# Patient Record
Sex: Female | Born: 1940
Health system: Southern US, Community
[De-identification: ages and names within clinical notes are randomized; demographics above are authoritative.]

## PROBLEM LIST (undated history)

## (undated) DIAGNOSIS — I2510 Atherosclerotic heart disease of native coronary artery without angina pectoris: Secondary | ICD-10-CM

## (undated) DIAGNOSIS — G250 Essential tremor: Principal | ICD-10-CM

## (undated) DIAGNOSIS — I951 Orthostatic hypotension: Secondary | ICD-10-CM

## (undated) DIAGNOSIS — K219 Gastro-esophageal reflux disease without esophagitis: Secondary | ICD-10-CM

## (undated) DIAGNOSIS — E039 Hypothyroidism, unspecified: Secondary | ICD-10-CM

## (undated) DIAGNOSIS — F419 Anxiety disorder, unspecified: Secondary | ICD-10-CM

## (undated) DIAGNOSIS — I350 Nonrheumatic aortic (valve) stenosis: Secondary | ICD-10-CM

## (undated) DIAGNOSIS — D473 Essential (hemorrhagic) thrombocythemia: Principal | ICD-10-CM

## (undated) DIAGNOSIS — E785 Hyperlipidemia, unspecified: Secondary | ICD-10-CM

## (undated) DIAGNOSIS — I10 Essential (primary) hypertension: Secondary | ICD-10-CM

## (undated) DIAGNOSIS — K589 Irritable bowel syndrome without diarrhea: Secondary | ICD-10-CM

## (undated) DIAGNOSIS — G20A1 Parkinson's disease without dyskinesia, without mention of fluctuations: Secondary | ICD-10-CM

## (undated) HISTORY — DX: Atherosclerotic heart disease of native coronary artery without angina pectoris: I25.10

## (undated) HISTORY — DX: Essential (hemorrhagic) thrombocythemia: D47.3

## (undated) HISTORY — PX: TRANSTHORACIC ECHOCARDIOGRAM: SHX275

## (undated) HISTORY — DX: Essential tremor: G25.0

## (undated) HISTORY — DX: Nonrheumatic aortic (valve) stenosis: I35.0

## (undated) HISTORY — DX: Orthostatic hypotension: I95.1

---
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
## 1977-03-16 HISTORY — PX: ABDOMINAL HYSTERECTOMY: SHX81

## 1992-03-16 HISTORY — PX: TOE SURGERY: SHX1073

## 1998-06-10 ENCOUNTER — Other Ambulatory Visit: Admission: RE | Admit: 1998-06-10 | Discharge: 1998-06-10 | Payer: Self-pay | Admitting: Obstetrics & Gynecology

## 1999-03-17 HISTORY — PX: CHOLECYSTECTOMY, LAPAROSCOPIC: SHX56

## 1999-06-09 ENCOUNTER — Other Ambulatory Visit: Admission: RE | Admit: 1999-06-09 | Discharge: 1999-06-09 | Payer: Self-pay | Admitting: Obstetrics & Gynecology

## 2000-02-27 ENCOUNTER — Encounter: Payer: Self-pay | Admitting: Family Medicine

## 2000-02-27 ENCOUNTER — Encounter: Admission: RE | Admit: 2000-02-27 | Discharge: 2000-02-27 | Payer: Self-pay | Admitting: Family Medicine

## 2000-03-05 ENCOUNTER — Observation Stay (HOSPITAL_COMMUNITY): Admission: RE | Admit: 2000-03-05 | Discharge: 2000-03-06 | Payer: Self-pay | Admitting: Surgery

## 2000-03-05 ENCOUNTER — Encounter (INDEPENDENT_AMBULATORY_CARE_PROVIDER_SITE_OTHER): Payer: Self-pay | Admitting: Specialist

## 2000-04-30 ENCOUNTER — Ambulatory Visit (HOSPITAL_COMMUNITY): Admission: RE | Admit: 2000-04-30 | Discharge: 2000-04-30 | Payer: Self-pay | Admitting: Gastroenterology

## 2000-06-24 ENCOUNTER — Other Ambulatory Visit: Admission: RE | Admit: 2000-06-24 | Discharge: 2000-06-24 | Payer: Self-pay | Admitting: Obstetrics & Gynecology

## 2002-08-01 ENCOUNTER — Other Ambulatory Visit: Admission: RE | Admit: 2002-08-01 | Discharge: 2002-08-01 | Payer: Self-pay | Admitting: Obstetrics & Gynecology

## 2003-08-30 ENCOUNTER — Other Ambulatory Visit: Admission: RE | Admit: 2003-08-30 | Discharge: 2003-08-30 | Payer: Self-pay | Admitting: Obstetrics & Gynecology

## 2004-08-13 ENCOUNTER — Ambulatory Visit (HOSPITAL_COMMUNITY): Admission: RE | Admit: 2004-08-13 | Discharge: 2004-08-13 | Payer: Self-pay | Admitting: Gastroenterology

## 2004-08-13 ENCOUNTER — Encounter (INDEPENDENT_AMBULATORY_CARE_PROVIDER_SITE_OTHER): Payer: Self-pay | Admitting: Specialist

## 2005-10-21 ENCOUNTER — Encounter: Admission: RE | Admit: 2005-10-21 | Discharge: 2005-10-21 | Payer: Self-pay | Admitting: Gastroenterology

## 2007-12-07 ENCOUNTER — Ambulatory Visit (HOSPITAL_COMMUNITY): Admission: RE | Admit: 2007-12-07 | Discharge: 2007-12-07 | Payer: Self-pay | Admitting: Gastroenterology

## 2007-12-09 ENCOUNTER — Emergency Department (HOSPITAL_COMMUNITY): Admission: EM | Admit: 2007-12-09 | Discharge: 2007-12-09 | Payer: Self-pay | Admitting: Emergency Medicine

## 2008-06-06 ENCOUNTER — Encounter: Admission: RE | Admit: 2008-06-06 | Discharge: 2008-06-06 | Payer: Self-pay | Admitting: Family Medicine

## 2008-07-11 ENCOUNTER — Ambulatory Visit: Payer: Self-pay | Admitting: Vascular Surgery

## 2008-12-26 ENCOUNTER — Ambulatory Visit: Payer: Self-pay | Admitting: Oncology

## 2009-01-16 LAB — CBC WITH DIFFERENTIAL (CANCER CENTER ONLY)
BASO%: 0.6 % (ref 0.0–2.0)
Eosinophils Absolute: 1.2 10*3/uL — ABNORMAL HIGH (ref 0.0–0.5)
HCT: 38.6 % (ref 34.8–46.6)
LYMPH%: 14.8 % (ref 14.0–48.0)
MCH: 30 pg (ref 26.0–34.0)
MCV: 90 fL (ref 81–101)
MONO%: 6 % (ref 0.0–13.0)
NEUT%: 63.8 % (ref 39.6–80.0)
Platelets: 551 10*3/uL — ABNORMAL HIGH (ref 145–400)
RDW: 13.2 % (ref 10.5–14.6)

## 2009-01-16 LAB — CMP (CANCER CENTER ONLY)
CO2: 29 mEq/L (ref 18–33)
Calcium: 9 mg/dL (ref 8.0–10.3)
Glucose, Bld: 138 mg/dL — ABNORMAL HIGH (ref 73–118)
Sodium: 140 mEq/L (ref 128–145)
Total Bilirubin: 0.5 mg/dl (ref 0.20–1.60)
Total Protein: 7 g/dL (ref 6.4–8.1)

## 2009-01-16 LAB — MORPHOLOGY - CHCC SATELLITE: RBC Comments: NORMAL

## 2009-01-22 LAB — PROTEIN ELECTROPHORESIS, SERUM
Alpha-1-Globulin: 4.6 % (ref 2.9–4.9)
Alpha-2-Globulin: 8.3 % (ref 7.1–11.8)
Beta 2: 4.1 % (ref 3.2–6.5)
Beta Globulin: 5.7 % (ref 4.7–7.2)
Gamma Globulin: 15.3 % (ref 11.1–18.8)

## 2009-01-22 LAB — IRON AND TIBC
%SAT: 29 % (ref 20–55)
Iron: 81 ug/dL (ref 42–145)

## 2009-01-22 LAB — LACTATE DEHYDROGENASE: LDH: 143 U/L (ref 94–250)

## 2009-02-12 ENCOUNTER — Ambulatory Visit: Payer: Self-pay | Admitting: Oncology

## 2009-02-13 ENCOUNTER — Other Ambulatory Visit: Admission: RE | Admit: 2009-02-13 | Discharge: 2009-02-13 | Payer: Self-pay | Admitting: Oncology

## 2009-02-13 LAB — CBC WITH DIFFERENTIAL (CANCER CENTER ONLY)
BASO#: 0.1 10*3/uL (ref 0.0–0.2)
Eosinophils Absolute: 1.6 10*3/uL — ABNORMAL HIGH (ref 0.0–0.5)
HCT: 37.4 % (ref 34.8–46.6)
HGB: 12.6 g/dL (ref 11.6–15.9)
LYMPH%: 14.6 % (ref 14.0–48.0)
MCH: 30.6 pg (ref 26.0–34.0)
MCV: 90 fL (ref 81–101)
MONO#: 0.4 10*3/uL (ref 0.1–0.9)
MONO%: 5.4 % (ref 0.0–13.0)
RBC: 4.13 10*6/uL (ref 3.70–5.32)

## 2009-03-11 ENCOUNTER — Ambulatory Visit: Payer: Self-pay | Admitting: Oncology

## 2009-03-13 LAB — CBC WITH DIFFERENTIAL (CANCER CENTER ONLY)
BASO%: 0.7 % (ref 0.0–2.0)
EOS%: 14.7 % — ABNORMAL HIGH (ref 0.0–7.0)
LYMPH%: 17.3 % (ref 14.0–48.0)
MCHC: 32.8 g/dL (ref 32.0–36.0)
MCV: 90 fL (ref 81–101)
MONO#: 0.5 10*3/uL (ref 0.1–0.9)
NEUT%: 61.5 % (ref 39.6–80.0)
Platelets: 548 10*3/uL — ABNORMAL HIGH (ref 145–400)
RDW: 12.7 % (ref 10.5–14.6)
WBC: 8.3 10*3/uL (ref 3.9–10.0)

## 2009-05-29 ENCOUNTER — Encounter: Admission: RE | Admit: 2009-05-29 | Discharge: 2009-05-29 | Payer: Self-pay | Admitting: Gastroenterology

## 2009-06-07 ENCOUNTER — Ambulatory Visit: Payer: Self-pay | Admitting: Oncology

## 2009-06-12 LAB — CBC WITH DIFFERENTIAL (CANCER CENTER ONLY)
BASO%: 0.6 % (ref 0.0–2.0)
EOS%: 14.2 % — ABNORMAL HIGH (ref 0.0–7.0)
LYMPH#: 1.1 10*3/uL (ref 0.9–3.3)
MCHC: 33.8 g/dL (ref 32.0–36.0)
MONO#: 0.5 10*3/uL (ref 0.1–0.9)
NEUT#: 3.8 10*3/uL (ref 1.5–6.5)
NEUT%: 60.2 % (ref 39.6–80.0)
Platelets: 501 10*3/uL — ABNORMAL HIGH (ref 145–400)
RDW: 13.7 % (ref 10.5–14.6)
WBC: 6.3 10*3/uL (ref 3.9–10.0)

## 2009-08-30 ENCOUNTER — Ambulatory Visit: Payer: Self-pay | Admitting: Oncology

## 2009-09-11 LAB — CBC WITH DIFFERENTIAL (CANCER CENTER ONLY)
BASO%: 0.6 % (ref 0.0–2.0)
EOS%: 20 % — ABNORMAL HIGH (ref 0.0–7.0)
HCT: 37.6 % (ref 34.8–46.6)
HGB: 12.5 g/dL (ref 11.6–15.9)
LYMPH#: 1.4 10*3/uL (ref 0.9–3.3)
MCHC: 33.2 g/dL (ref 32.0–36.0)
MONO#: 0.5 10*3/uL (ref 0.1–0.9)
NEUT#: 4.3 10*3/uL (ref 1.5–6.5)
NEUT%: 54.4 % (ref 39.6–80.0)
RDW: 13.3 % (ref 10.5–14.6)
WBC: 7.9 10*3/uL (ref 3.9–10.0)

## 2009-09-11 LAB — CMP (CANCER CENTER ONLY)
ALT(SGPT): 23 U/L (ref 10–47)
AST: 21 U/L (ref 11–38)
Albumin: 4.3 g/dL (ref 3.3–5.5)
CO2: 30 mEq/L (ref 18–33)
Calcium: 9 mg/dL (ref 8.0–10.3)
Chloride: 100 mEq/L (ref 98–108)
Potassium: 4.4 mEq/L (ref 3.3–4.7)
Total Protein: 6.7 g/dL (ref 6.4–8.1)

## 2009-10-11 ENCOUNTER — Ambulatory Visit: Payer: Self-pay | Admitting: Oncology

## 2009-10-16 LAB — CBC WITH DIFFERENTIAL (CANCER CENTER ONLY)
BASO%: 0.6 % (ref 0.0–2.0)
EOS%: 17.7 % — ABNORMAL HIGH (ref 0.0–7.0)
HCT: 37 % (ref 34.8–46.6)
LYMPH%: 17 % (ref 14.0–48.0)
MCH: 30.2 pg (ref 26.0–34.0)
MCHC: 34.6 g/dL (ref 32.0–36.0)
MCV: 87 fL (ref 81–101)
MONO%: 7 % (ref 0.0–13.0)
NEUT%: 57.7 % (ref 39.6–80.0)
Platelets: 597 10*3/uL — ABNORMAL HIGH (ref 145–400)
RDW: 13.3 % (ref 10.5–14.6)
WBC: 9 10*3/uL (ref 3.9–10.0)

## 2009-11-07 ENCOUNTER — Ambulatory Visit: Payer: Self-pay | Admitting: Oncology

## 2009-11-13 LAB — CBC WITH DIFFERENTIAL (CANCER CENTER ONLY)
BASO%: 0.5 % (ref 0.0–2.0)
EOS%: 14.7 % — ABNORMAL HIGH (ref 0.0–7.0)
HCT: 35.7 % (ref 34.8–46.6)
LYMPH#: 1.1 10*3/uL (ref 0.9–3.3)
MCHC: 33.8 g/dL (ref 32.0–36.0)
MONO#: 0.5 10*3/uL (ref 0.1–0.9)
NEUT%: 61.8 % (ref 39.6–80.0)
Platelets: 562 10*3/uL — ABNORMAL HIGH (ref 145–400)
RDW: 13.1 % (ref 10.5–14.6)
WBC: 7.1 10*3/uL (ref 3.9–10.0)

## 2009-12-18 ENCOUNTER — Ambulatory Visit (HOSPITAL_COMMUNITY): Admission: RE | Admit: 2009-12-18 | Discharge: 2009-12-18 | Payer: Self-pay | Admitting: Gastroenterology

## 2010-01-09 ENCOUNTER — Ambulatory Visit: Payer: Self-pay | Admitting: Oncology

## 2010-01-15 LAB — CBC WITH DIFFERENTIAL (CANCER CENTER ONLY)
BASO%: 0.6 % (ref 0.0–2.0)
EOS%: 13.5 % — ABNORMAL HIGH (ref 0.0–7.0)
MCH: 29.5 pg (ref 26.0–34.0)
MCHC: 33.7 g/dL (ref 32.0–36.0)
MONO%: 6.9 % (ref 0.0–13.0)
NEUT#: 5.1 10*3/uL (ref 1.5–6.5)
Platelets: 546 10*3/uL — ABNORMAL HIGH (ref 145–400)
RBC: 4.36 10*6/uL (ref 3.70–5.32)

## 2010-02-17 ENCOUNTER — Ambulatory Visit: Payer: Self-pay | Admitting: Oncology

## 2010-02-19 LAB — COMPREHENSIVE METABOLIC PANEL
ALT: 13 U/L (ref 0–35)
AST: 18 U/L (ref 0–37)
Albumin: 4.3 g/dL (ref 3.5–5.2)
BUN: 17 mg/dL (ref 6–23)
CO2: 27 mEq/L (ref 19–32)
Calcium: 9.2 mg/dL (ref 8.4–10.5)
Chloride: 105 mEq/L (ref 96–112)
Potassium: 4.4 mEq/L (ref 3.5–5.3)

## 2010-02-19 LAB — CBC WITH DIFFERENTIAL/PLATELET
Basophils Absolute: 0 10*3/uL (ref 0.0–0.1)
EOS%: 17.9 % — ABNORMAL HIGH (ref 0.0–7.0)
HGB: 12.4 g/dL (ref 11.6–15.9)
MCH: 30.5 pg (ref 25.1–34.0)
MONO#: 0.5 10*3/uL (ref 0.1–0.9)
NEUT#: 5.5 10*3/uL (ref 1.5–6.5)
RDW: 14.3 % (ref 11.2–14.5)
WBC: 8.7 10*3/uL (ref 3.9–10.3)
lymph#: 1.1 10*3/uL (ref 0.9–3.3)

## 2010-06-04 ENCOUNTER — Other Ambulatory Visit: Payer: Self-pay | Admitting: Oncology

## 2010-06-04 ENCOUNTER — Encounter (HOSPITAL_BASED_OUTPATIENT_CLINIC_OR_DEPARTMENT_OTHER): Payer: Medicare Other | Admitting: Oncology

## 2010-06-04 DIAGNOSIS — D473 Essential (hemorrhagic) thrombocythemia: Secondary | ICD-10-CM

## 2010-06-04 DIAGNOSIS — D47Z9 Other specified neoplasms of uncertain behavior of lymphoid, hematopoietic and related tissue: Secondary | ICD-10-CM

## 2010-06-04 LAB — CBC WITH DIFFERENTIAL/PLATELET
Eosinophils Absolute: 1.6 10*3/uL — ABNORMAL HIGH (ref 0.0–0.5)
MONO#: 0.5 10*3/uL (ref 0.1–0.9)
MONO%: 5.8 % (ref 0.0–14.0)
NEUT#: 5.6 10*3/uL (ref 1.5–6.5)
RBC: 4.1 10*6/uL (ref 3.70–5.45)
RDW: 14 % (ref 11.2–14.5)
WBC: 8.9 10*3/uL (ref 3.9–10.3)

## 2010-06-04 LAB — LACTATE DEHYDROGENASE: LDH: 156 U/L (ref 94–250)

## 2010-06-17 LAB — BONE MARROW EXAM

## 2010-06-17 LAB — TISSUE HYBRIDIZATION (BONE MARROW)-NCBH

## 2010-07-29 NOTE — Procedures (Signed)
LOWER EXTREMITY VENOUS REFLUX EXAM   INDICATION:   EXAM:  Using color-flow imaging and pulse Doppler spectral analysis, the  bilateral common femoral, superficial femoral, popliteal, posterior  tibial, greater and lesser saphenous veins are evaluated.  There is  evidence suggesting deep venous insufficiency in the bilateral lower  extremities.   The right saphenofemoral junction is competent.  The left saphenofemoral  junction is not competent.  The bilateral GSV veins are not competent  with calibers as described below.   The bilateral proximal short saphenous veins were not adequately  visualized.   GSV Diameter (used if found to be incompetent only)                                            Right    Left  Proximal Greater Saphenous Vein           0.44 cm  0.49 cm  Proximal-to-mid-thigh                     cm       cm  Mid thigh                                 0.49 cm  0.52 cm  Mid-distal thigh                          cm       cm  Distal thigh                              0.4 cm   0.37 cm  Knee                                      0.55 cm  0.37 cm   IMPRESSION:  1. Bilateral greater saphenous vein reflux is noted as described above      and on the attached worksheet.  2. The bilateral greater saphenous veins are not tortuous.  3. Deep venous incompetence is noted.   ___________________________________________  Larina Earthly, M.D.   CH/MEDQ  D:  07/11/2008  T:  07/11/2008  Job:  [id number]

## 2010-07-29 NOTE — Consult Note (Signed)
NEW PATIENT CONSULTATION   Deanna Hill, Deanna Hill  DOB:  1940-12-07                                       07/11/2008  ONGEX#:[id number]   The patient presents today for evaluation of lower extremity venous  pathology.  She is a very pleasant healthy 70 year old white female with  concerns regarding telangiectasia over both lower extremities.  She also  reports discomfort and more specifically numbness over her left  pretibial area.  She reports that this is so significant that she  actually had her leg give way with some bruising over this.  She does  not have a history of deep venous thrombosis and does not have any  varicose veins.  She does not have any other pain in each leg.  Does not  have any history of swelling, hemorrhage, ulceration or  thrombophlebitis.   PAST MEDICAL HISTORY:  Is significant for hypothyroidism and does not  have any history of cardiac disease or hypertension.   FAMILY HISTORY:  Significant for premature atherosclerotic disease in  her father.   SOCIAL HISTORY:  She is married.  She does not smoke or drink alcohol.   REVIEW OF SYSTEMS:  Positive for palpitations, shortness of breath with  exertion, hiatal hernia, reflux, constipation, headache and dizziness.   MEDICATION ALLERGIES:  Iodine.   CURRENT MEDICATIONS:  Synthroid, Kapidex and Zantac.   PHYSICAL EXAMINATION:  A well-developed white female appearing her  stated age of 54.  Blood pressure is 163/87, pulse 78, respirations 18.  Her radial pulses are 2+ bilaterally.  She has 2+ dorsalis pedis pulses  bilaterally.  She does not have any swelling at each extremity and does  not have any evidence of reticular vein or venous varicosities.  She  does have scattered spider vein telangiectasia bilaterally most  prominently over her left thigh and right lateral calf.   She underwent noninvasive vascular laboratory studies in our office and  this reveals gross reflux in her great  saphenous vein bilaterally.  She  does not have any evidence of small saphenous vein reflux.  I discussed  this with the patient and her husband present.  I explained despite the  reflux in her saphenous vein I do not see any evidence of significant  venous hypertension.  She is concerned regarding the appearance of her  spider vein telangiectasia.  I did explain the treatment technique with  sclerotherapy for cosmetic improvement.  I explained that there are  certainly practitioners who would recommend laser ablation of her  refluxing saphenous vein prior to sclerotherapy.  I feel that this would  be inappropriately aggressive since she is not having any other signs or  symptoms of venous hypertension aside from a few scattered  telangiectasia.  I have suggested that she proceed with sclerotherapy if  she wishes to have attempted improvement in cosmesis but would not  recommend more aggressive treatment for her reflux since this is her  only symptom.  She understands this and will notify us when she wishes  to proceed with sclerotherapy.   Larina Earthly, M.D.  Electronically Signed   TFE/MEDQ  D:  07/11/2008  T:  07/12/2008  Job:  [id number]   cc:   Caryn Bee L. Little, M.D.

## 2010-08-01 NOTE — Op Note (Signed)
Deanna Hill, Deanna Hill             ACCOUNT NO.:  [id number]   MEDICAL RECORD NO.:  [id number]          PATIENT TYPE:  AMB   LOCATION:  ENDO                         FACILITY:  [facility]   PHYSICIAN:  [name], M.D.    DATE OF BIRTH:  01/03/1941   DATE OF PROCEDURE:  08/13/2004  DATE OF DISCHARGE:                                 OPERATIVE REPORT   PROCEDURE:  Esophagogastroduodenoscopy with biopsy.   INDICATIONS FOR PROCEDURE:  Patient with long-standing upper tract symptoms,  getting worse, and not responding to the usual medicines.  Consent was  signed after risks, benefits, and options were thoroughly discussed in the  office.   MEDICATIONS USED:  Demerol 60, Versed 6.   PROCEDURE:  The video endoscope was inserted by direct vision.  The  esophagus was normal.  She did have a tiny hiatal hernia.  The scope passed  into the stomach and advanced to the antrum, there was some mild antritis, a  few erosions were seen.  It was advanced through a normal pylorus into a  normal duodenum bulb and around the C-loop to a normal second and probably  third and fourth part of the duodenum.  A few distal duodenal biopsies were  obtained.  The scope was slowly withdrawn back to the bulb which again  confirmed its normal appearance.  The scope was drawn back into the stomach  and retroflexed.  Angularis, cardia, fundus, lesser and greater curve were  all evaluated on retroflex and then straight visualization, other than some  mild gastritis, no abnormalities were seen.  A few biopsies of the antrum  and the erosions were obtained and put in a second container and a few of  the proximal stomach, as well, to rule out Helicobacter.  The air was  suctioned, the scope was slowly withdrawn.  Again, a good look at the  esophagus was normal.  The scope was removed.  The patient tolerated the  procedure well.  There was no obvious complications.   ENDOSCOPIC DIAGNOSIS:  1. Tiny hiatal hernia.  2. Mild  gastritis.  3. A few antral erosions and antritis status post biopsy.  4. Otherwise, normal esophagogastroduodenoscopy to probably the fourth      part of the duodenum status post biopsy.     PLAN:  Await pathology, recheck her symptoms at that junction, probably try  other pump inhibitors we have not tried and we will leave her samples,  follow up p.r.n. or in one month recheck symptoms and decide any other  workup plans at that time.      MEM/MEDQ  D:  08/13/2004  T:  08/13/2004  Job:  [id number]   cc:   Caryn Bee L. Little, M.D.  [address]  [place]  [place] [address]  Fax: [phone]

## 2010-08-01 NOTE — Procedures (Signed)
[facility]  Patient:    Deanna Hill, Deanna Hill                    MRN: [id number] Proc. Date: 04/30/00 Adm. Date:  [id number] Attending:  Nelda Marseille CC:         Anna Genre. Little, M.D.   Procedure Report  PROCEDURE:  Colonoscopy.  INDICATIONS FOR PROCEDURE:  A patient with family history of colon polyps, personal history of colon polyps.  Due for repeat screening.  Consent was signed after risks, benefits, methods and options were thoroughly discussed in the office on multiple occasions.  MEDICATIONS USED:  Demerol 50 mg, Versed 7 mg.  DESCRIPTION OF PROCEDURE:  Rectal inspection was pertinent for external hemorrhoids.  Digital examination was negative.  The video pediatric colonoscope was inserted and easily advanced to the proximal level of the splenic flexure; which had an incredibly tortuous turn. We first rolled her onto her back and then on her right side, then back to her back, and then finally back on her left side again to get around this curve. We then advanced to the hepatic flexure and again met a tortuous curve, which required rolling her on her back and some abdominal pressure to get to the cecum; which was identified by the appendiceal orifice and the ileocecal valve.  No obvious abnormality, but a rare left-sided diverticula was seen on insertion.  The scope was slowly withdrawn.  Her colon was very tortuous, when we did fall back around the loop we did try to readvance around a curve, but it was difficult.  No obvious abnormality was seen on slow withdrawal.  The prep was adequate.  There was minimal liquid still that required washing and suctioning.  Other than the left-sided rare occasional diverticula no other abnormalities were seen.  Once back in the rectum the scope was in retroflexed and pertinent for some internal hemorrhoids.  The scope was straightened and air was withdrawn, the scope removed.  The patient tolerated  the procedure well. There were no obvious or immediate complications.  ENDOSCOPIC DIAGNOSES: 1. Internal and external hemorrhoids. 2. Left occasional diverticula. 3. Tortuous colon. 4. Otherwise within normal limits to the cecum.  PLAN:  Check follow up p.r.n.  Yearly rectals and guaiacs.  Otherwise repeat screening in five years, but will probably consider a barium enema, virtual colonoscopy or any other x-ray technique at that junction due to her tortuosity. DD:  04/30/00 TD:  04/30/00 Job: [id number]

## 2010-12-15 LAB — COMPREHENSIVE METABOLIC PANEL
ALT: 17
AST: 18
Albumin: 4
Alkaline Phosphatase: 43
BUN: 13
Chloride: 107
GFR calc Af Amer: >60
Potassium: 4.3
Sodium: 140
Total Bilirubin: 0.6
Total Protein: 6.5

## 2010-12-15 LAB — DIFFERENTIAL
Basophils Absolute: 0.1
Basophils Relative: 1
Eosinophils Absolute: 1.1 — ABNORMAL HIGH
Eosinophils Relative: 10 — ABNORMAL HIGH
Monocytes Absolute: 0.5
Monocytes Relative: 5
Neutro Abs: 8 — ABNORMAL HIGH

## 2010-12-15 LAB — CBC
HCT: 39.9
Platelets: 459 — ABNORMAL HIGH
RDW: 13.2
WBC: 11 — ABNORMAL HIGH

## 2010-12-15 LAB — POCT CARDIAC MARKERS: CKMB, poc: <1 — ABNORMAL LOW

## 2010-12-18 ENCOUNTER — Other Ambulatory Visit: Payer: Self-pay | Admitting: Oncology

## 2010-12-18 ENCOUNTER — Encounter (HOSPITAL_BASED_OUTPATIENT_CLINIC_OR_DEPARTMENT_OTHER): Payer: Medicare Other | Admitting: Oncology

## 2010-12-18 DIAGNOSIS — D473 Essential (hemorrhagic) thrombocythemia: Secondary | ICD-10-CM

## 2010-12-18 DIAGNOSIS — D47Z9 Other specified neoplasms of uncertain behavior of lymphoid, hematopoietic and related tissue: Secondary | ICD-10-CM

## 2010-12-18 DIAGNOSIS — Z23 Encounter for immunization: Secondary | ICD-10-CM

## 2010-12-18 LAB — CBC WITH DIFFERENTIAL/PLATELET
Basophils Absolute: 0.1 10*3/uL (ref 0.0–0.1)
Eosinophils Absolute: 0.9 10*3/uL — ABNORMAL HIGH (ref 0.0–0.5)
HCT: 40.6 % (ref 34.8–46.6)
HGB: 13.1 g/dL (ref 11.6–15.9)
MCH: 28.8 pg (ref 25.1–34.0)
MCV: 89.2 fL (ref 79.5–101.0)
MONO%: 4.7 % (ref 0.0–14.0)
NEUT#: 11.3 10*3/uL — ABNORMAL HIGH (ref 1.5–6.5)
NEUT%: 81.8 % — ABNORMAL HIGH (ref 38.4–76.8)
RDW: 13.9 % (ref 11.2–14.5)
lymph#: 0.9 10*3/uL (ref 0.9–3.3)

## 2010-12-18 LAB — COMPREHENSIVE METABOLIC PANEL
Albumin: 4.6 g/dL (ref 3.5–5.2)
BUN: 13 mg/dL (ref 6–23)
CO2: 27 mEq/L (ref 19–32)
Glucose, Bld: 126 mg/dL — ABNORMAL HIGH (ref 70–99)
Potassium: 4.6 mEq/L (ref 3.5–5.3)
Sodium: 138 mEq/L (ref 135–145)
Total Protein: 7.2 g/dL (ref 6.0–8.3)

## 2010-12-18 LAB — LACTATE DEHYDROGENASE: LDH: 158 U/L (ref 94–250)

## 2011-01-20 ENCOUNTER — Telehealth: Payer: Self-pay | Admitting: *Deleted

## 2011-01-20 NOTE — Telephone Encounter (Signed)
Gave patient appointment for 02-2011. Printed out calendar and gave to the patient

## 2011-01-28 ENCOUNTER — Other Ambulatory Visit: Payer: Medicare Other

## 2011-01-28 ENCOUNTER — Ambulatory Visit: Payer: Medicare Other | Admitting: Oncology

## 2011-03-05 ENCOUNTER — Other Ambulatory Visit (HOSPITAL_BASED_OUTPATIENT_CLINIC_OR_DEPARTMENT_OTHER): Payer: Medicare Other | Admitting: Lab

## 2011-03-05 ENCOUNTER — Other Ambulatory Visit: Payer: Self-pay | Admitting: Oncology

## 2011-03-05 ENCOUNTER — Telehealth: Payer: Self-pay | Admitting: Oncology

## 2011-03-05 ENCOUNTER — Ambulatory Visit (HOSPITAL_BASED_OUTPATIENT_CLINIC_OR_DEPARTMENT_OTHER): Payer: Medicare Other | Admitting: Oncology

## 2011-03-05 VITALS — BP 134/74 | HR 86 | Temp 98.5°F | Wt 150.4 lb

## 2011-03-05 DIAGNOSIS — D47Z9 Other specified neoplasms of uncertain behavior of lymphoid, hematopoietic and related tissue: Secondary | ICD-10-CM

## 2011-03-05 DIAGNOSIS — D473 Essential (hemorrhagic) thrombocythemia: Secondary | ICD-10-CM

## 2011-03-05 DIAGNOSIS — F4321 Adjustment disorder with depressed mood: Secondary | ICD-10-CM

## 2011-03-05 DIAGNOSIS — D75839 Thrombocytosis, unspecified: Secondary | ICD-10-CM

## 2011-03-05 LAB — CBC WITH DIFFERENTIAL/PLATELET
BASO%: 0.3 % (ref 0.0–2.0)
EOS%: 13 % — ABNORMAL HIGH (ref 0.0–7.0)
LYMPH%: 12.9 % — ABNORMAL LOW (ref 14.0–49.7)
MCH: 29 pg (ref 25.1–34.0)
MCHC: 32.9 g/dL (ref 31.5–36.0)
MCV: 88 fL (ref 79.5–101.0)
MONO%: 5.8 % (ref 0.0–14.0)
Platelets: 651 10*3/uL — ABNORMAL HIGH (ref 145–400)
RBC: 4.42 10*6/uL (ref 3.70–5.45)
RDW: 14.3 % (ref 11.2–14.5)

## 2011-03-05 NOTE — Telephone Encounter (Signed)
Gv pt appt for march2013 °

## 2011-03-11 ENCOUNTER — Encounter: Payer: Self-pay | Admitting: Oncology

## 2011-03-11 DIAGNOSIS — D75839 Thrombocytosis, unspecified: Secondary | ICD-10-CM

## 2011-03-11 DIAGNOSIS — D473 Essential (hemorrhagic) thrombocythemia: Secondary | ICD-10-CM | POA: Insufficient documentation

## 2011-03-11 HISTORY — DX: Thrombocytosis, unspecified: D75.839

## 2011-03-11 NOTE — Progress Notes (Signed)
CC: Deanna Hill. Little, M.D.  [name], M.D.   DIAGNOSIS:  70 year old female with essential thrombocytosis with a JAK2 mutation.  CURRENT THERAPY:  Full-dose aspirin.  INTERVAL HISTORY:  The patient is seen in followup today.  Sadly patient has lost her husband over the last few months. She is quite sad about this. Clinically she is doing well she denies any fevers chills night sweats headaches shortness of breath chest pains palpitations she has no myalgias or arthralgias she has not had any recent hospitalizations. In the clinic today patient was quite tearful and continues to grieve her husband's death. Support was given to the patient today.  REVIEW OF SYSTEMS:  Remainder of the 10-point review of systems is negative.Deanna Hill  MEDICATIONS: Reviewed and updated in EMR  PHYSICAL EXAMINATION:  GENERAL:  The patient is awake, alert.  She appears well.    Vital Signs:  Blood pressure 134/74, pulse 86, temperature 98.5 F (36.9 C), temperature source Oral, weight 150 lb 6.4 oz (68.221 kg).  HEENT Exam:  EOMI, PERRLA, sclerae anicteric, no conjunctival pallor.  Oral mucosa is moist.  Neck:  Supple.  Lungs:  Clear.  Cardiovascular:  Regular rate and rhythm.  No murmurs.  Abdomen:  Soft, nontender, no HSM.  Extremities:  No edema.  Neuro:  Patient is alert, oriented, otherwise nonfocal.  LABORATORY DATA:  Lab Results  Component Value Date   WBC 9.0 03/05/2011   HGB 12.8 03/05/2011   HCT 38.9 03/05/2011   MCV 88.0 03/05/2011   PLT 651* 03/05/2011    IMPRESSION AND PLAN:  70 year old female with essential thrombocytosis with a JAK2 mutation. Patient's platelets have gone down a little bit to 651,000. Clinically she remains asymptomatic. We will continue to follow her every 6 months time. I did recommend the patient be seen in great counseling. She seems to have a lot of support in her family so I do think that she'll do well but it is a hard time she is going through at this point.  All  questions were answered today I spent 30 minutes greater than 50% of the time was spent in counseling and coordination of care.  Drue Second, MD Medical/Oncology [facility] [phone] (beeper) [phone] (Office)

## 2011-06-04 ENCOUNTER — Encounter: Payer: Self-pay | Admitting: Oncology

## 2011-06-04 ENCOUNTER — Telehealth: Payer: Self-pay | Admitting: Oncology

## 2011-06-04 ENCOUNTER — Ambulatory Visit (HOSPITAL_BASED_OUTPATIENT_CLINIC_OR_DEPARTMENT_OTHER): Payer: Medicare Other | Admitting: Oncology

## 2011-06-04 ENCOUNTER — Other Ambulatory Visit (HOSPITAL_BASED_OUTPATIENT_CLINIC_OR_DEPARTMENT_OTHER): Payer: Medicare Other | Admitting: Lab

## 2011-06-04 VITALS — BP 144/69 | HR 83 | Temp 98.6°F | Ht 68.0 in | Wt 157.2 lb

## 2011-06-04 DIAGNOSIS — D473 Essential (hemorrhagic) thrombocythemia: Secondary | ICD-10-CM

## 2011-06-04 DIAGNOSIS — D75839 Thrombocytosis, unspecified: Secondary | ICD-10-CM

## 2011-06-04 LAB — CBC WITH DIFFERENTIAL/PLATELET
Basophils Absolute: 0.2 10*3/uL — ABNORMAL HIGH (ref 0.0–0.1)
EOS%: 16.6 % — ABNORMAL HIGH (ref 0.0–7.0)
HCT: 39.8 % (ref 34.8–46.6)
HGB: 12.6 g/dL (ref 11.6–15.9)
LYMPH%: 13 % — ABNORMAL LOW (ref 14.0–49.7)
MCH: 28 pg (ref 25.1–34.0)
MCV: 88.4 fL (ref 79.5–101.0)
MONO%: 5.2 % (ref 0.0–14.0)
NEUT%: 63.8 % (ref 38.4–76.8)
RDW: 14.6 % — ABNORMAL HIGH (ref 11.2–14.5)

## 2011-06-04 MED ORDER — HYDROXYUREA 500 MG PO CAPS: 500.0000 mg | ORAL_CAPSULE | Freq: Every day | ORAL | Status: AC

## 2011-06-04 NOTE — Progress Notes (Signed)
CC: Deanna Hill. Little, M.D.  [name], M.D.   DIAGNOSIS:  71 year old female with essential thrombocytosis with a JAK2 mutation.  CURRENT THERAPY:  Full-dose aspirin.  INTERVAL HISTORY:  The patient is seen in followup today.  Sadly patient has lost her husband over the last few months. She continues to be quite sad about this. Clinically she is doing well she denies any fevers chills night sweats headaches shortness of breath chest pains palpitations she has no myalgias or arthralgias she has not had any recent hospitalizations. In the clinic today patient was quite tearful and continues to grieve her husband's death. Support was given to the patient today.  REVIEW OF SYSTEMS:  Remainder of the 10-point review of systems is negative.[name]  MEDICATIONS: Reviewed and updated in EMR  PHYSICAL EXAMINATION:  GENERAL:  The patient is awake, alert.  She appears well.    Vital Signs:  Blood pressure 144/69, pulse 83, temperature 98.6 F (37 C), temperature source Oral, height 5\' 8"  (1.727 m), weight 157 lb 3.2 oz (71.305 kg).  HEENT Exam:  EOMI, PERRLA, sclerae anicteric, no conjunctival pallor.  Oral mucosa is moist.  Neck:  Supple.  Lungs:  Clear.  Cardiovascular:  Regular rate and rhythm.  No murmurs.  Abdomen:  Soft, nontender, no HSM.  Extremities:  No edema.  Neuro:  Patient is alert, oriented, otherwise nonfocal.  LABORATORY DATA:  Lab Results  Component Value Date   WBC 10.6* 06/04/2011   HGB 12.6 06/04/2011   HCT 39.8 06/04/2011   MCV 88.4 06/04/2011   PLT 738* 06/04/2011    IMPRESSION AND PLAN:  70 year old female with:  1.  essential thrombocytosis with a JAK2 mutation. Her platelets are continuing to slowly rise. But she remains without any significant problems. 2. I have recommended that she begin hydrea at this time since we have shown that her counts consistently rising. I have given her prescription for hydrea 500 mg daily and we will check cbc on a weekly basis beginning on  06/15/11.   3. Risks and benefits of hydrea were discussed with the patient in detail  4. I will see the patient back on 07/06/11  All questions were answered today I spent 30 minutes greater than 50% of the time was spent in counseling and coordination of care.  Drue Second, MD Medical/Oncology [facility] [phone] (beeper) [phone] (Office)

## 2011-06-04 NOTE — Patient Instructions (Signed)
1. You will begin hydrea 500 mg on a daily basis. Beginning as soon as you get the prescription filled.  2. You will have blood drawn once a week beginning on 06/15/11.  3. I will see you back on 07/06/11 for follow up to see how you are doing.  4. If you have any problems please call [phone] and ask for my nurse.

## 2011-06-04 NOTE — Telephone Encounter (Signed)
gve the pt her march-may 2013 appt calendar

## 2011-06-09 ENCOUNTER — Encounter: Payer: Self-pay | Admitting: *Deleted

## 2011-06-09 ENCOUNTER — Ambulatory Visit (HOSPITAL_BASED_OUTPATIENT_CLINIC_OR_DEPARTMENT_OTHER): Payer: Medicare Other | Admitting: Lab

## 2011-06-09 DIAGNOSIS — D473 Essential (hemorrhagic) thrombocythemia: Secondary | ICD-10-CM

## 2011-06-09 DIAGNOSIS — D75839 Thrombocytosis, unspecified: Secondary | ICD-10-CM

## 2011-06-09 LAB — CBC WITH DIFFERENTIAL/PLATELET
BASO%: 0 % (ref 0.0–2.0)
LYMPH%: 11 % — ABNORMAL LOW (ref 14.0–49.7)
MCH: 28.9 pg (ref 25.1–34.0)
MCHC: 33 g/dL (ref 31.5–36.0)
MCV: 87.7 fL (ref 79.5–101.0)
MONO%: 4.9 % (ref 0.0–14.0)
Platelets: 721 10*3/uL — ABNORMAL HIGH (ref 145–400)
RBC: 4.53 10*6/uL (ref 3.70–5.45)

## 2011-06-09 NOTE — Progress Notes (Unsigned)
Pt reports that she awoke this morning with bleeding in her left eye. Pt is very "worried". Per NR, pt came in for a CBC and her eye was assessed. Labs have been reviewed by NR, all were WNL and platelets are improving. This has been relayed to pt who verbalizes understanding.

## 2011-06-11 ENCOUNTER — Other Ambulatory Visit: Payer: Medicare Other | Admitting: Lab

## 2011-06-18 ENCOUNTER — Other Ambulatory Visit (HOSPITAL_BASED_OUTPATIENT_CLINIC_OR_DEPARTMENT_OTHER): Payer: Medicare Other | Admitting: Lab

## 2011-06-18 DIAGNOSIS — D473 Essential (hemorrhagic) thrombocythemia: Secondary | ICD-10-CM

## 2011-06-18 DIAGNOSIS — D75839 Thrombocytosis, unspecified: Secondary | ICD-10-CM

## 2011-06-18 LAB — CBC WITH DIFFERENTIAL/PLATELET
BASO%: 1.4 % (ref 0.0–2.0)
EOS%: 13.5 % — ABNORMAL HIGH (ref 0.0–7.0)
LYMPH%: 13.1 % — ABNORMAL LOW (ref 14.0–49.7)
MCH: 28.5 pg (ref 25.1–34.0)
MCHC: 32.2 g/dL (ref 31.5–36.0)
MONO#: 0.6 10*3/uL (ref 0.1–0.9)
Platelets: 547 10*3/uL — ABNORMAL HIGH (ref 145–400)
RBC: 4.51 10*6/uL (ref 3.70–5.45)
WBC: 8.9 10*3/uL (ref 3.9–10.3)
lymph#: 1.2 10*3/uL (ref 0.9–3.3)
nRBC: 0 % (ref 0–0)

## 2011-06-23 ENCOUNTER — Telehealth: Payer: Self-pay | Admitting: *Deleted

## 2011-06-23 NOTE — Telephone Encounter (Signed)
Per MD- notified pt to continue Hydrea 500mg  Daily. Pt c/o " some dizziness at times. Could this be the Hydrea maybe I need to decrease the dose?" Encouraged fluids. Will review with MD Pt verbalized understanding.

## 2011-06-23 NOTE — Telephone Encounter (Signed)
Message copied by Cooper Render on Tue Jun 23, 2011  5:10 PM ------      Message from: Victorino December      Created: Tue Jun 23, 2011  4:09 PM       Call patient: call patient platelets better keep same dose of hydrea

## 2011-06-24 NOTE — Telephone Encounter (Signed)
per MD - notiifed pt Hydrea is not causing dizziness. Pt verbalized understanding.

## 2011-06-24 NOTE — Telephone Encounter (Signed)
I do not think that the Hydrea is causing her dizziness

## 2011-06-25 ENCOUNTER — Other Ambulatory Visit (HOSPITAL_BASED_OUTPATIENT_CLINIC_OR_DEPARTMENT_OTHER): Payer: Medicare Other | Admitting: Lab

## 2011-06-25 DIAGNOSIS — D75839 Thrombocytosis, unspecified: Secondary | ICD-10-CM

## 2011-06-25 DIAGNOSIS — D473 Essential (hemorrhagic) thrombocythemia: Secondary | ICD-10-CM

## 2011-06-25 LAB — CBC WITH DIFFERENTIAL/PLATELET
BASO%: 1.2 % (ref 0.0–2.0)
Basophils Absolute: 0.1 10*3/uL (ref 0.0–0.1)
EOS%: 14.6 % — ABNORMAL HIGH (ref 0.0–7.0)
HGB: 12.1 g/dL (ref 11.6–15.9)
MCH: 29.2 pg (ref 25.1–34.0)
MONO%: 8.1 % (ref 0.0–14.0)
RBC: 4.16 10*6/uL (ref 3.70–5.45)
RDW: 15.4 % — ABNORMAL HIGH (ref 11.2–14.5)
lymph#: 0.9 10*3/uL (ref 0.9–3.3)
nRBC: 0 % (ref 0–0)

## 2011-07-02 ENCOUNTER — Other Ambulatory Visit (HOSPITAL_BASED_OUTPATIENT_CLINIC_OR_DEPARTMENT_OTHER): Payer: Medicare Other | Admitting: Lab

## 2011-07-02 ENCOUNTER — Telehealth: Payer: Self-pay | Admitting: *Deleted

## 2011-07-02 DIAGNOSIS — D75839 Thrombocytosis, unspecified: Secondary | ICD-10-CM

## 2011-07-02 DIAGNOSIS — D473 Essential (hemorrhagic) thrombocythemia: Secondary | ICD-10-CM

## 2011-07-02 LAB — CBC WITH DIFFERENTIAL/PLATELET
BASO%: 1.3 % (ref 0.0–2.0)
Eosinophils Absolute: 1.4 10*3/uL — ABNORMAL HIGH (ref 0.0–0.5)
MCHC: 33.1 g/dL (ref 31.5–36.0)
MONO#: 0.5 10*3/uL (ref 0.1–0.9)
NEUT#: 4.7 10*3/uL (ref 1.5–6.5)
Platelets: 391 10*3/uL (ref 145–400)
RBC: 4.33 10*6/uL (ref 3.70–5.45)
RDW: 16 % — ABNORMAL HIGH (ref 11.2–14.5)
WBC: 7.8 10*3/uL (ref 3.9–10.3)
lymph#: 1.1 10*3/uL (ref 0.9–3.3)
nRBC: 0 % (ref 0–0)

## 2011-07-02 NOTE — Telephone Encounter (Signed)
Per MD, notified pt Platelets look good, Continue same dose of hydrea and recheck as scheduled. Pt verbalized taking 500mg  daily, and understood Md's instructions.

## 2011-07-02 NOTE — Telephone Encounter (Signed)
Message copied by Sharmarke Cicio, Gerald Leitz on Thu Jul 02, 2011 12:32 PM ------      Message from: Victorino December      Created: Thu Jul 02, 2011 11:59 AM       Call patient: platelts look good keep same dose of hydrea, and recheck as scheduled

## 2011-07-09 ENCOUNTER — Other Ambulatory Visit (HOSPITAL_BASED_OUTPATIENT_CLINIC_OR_DEPARTMENT_OTHER): Payer: Medicare Other | Admitting: Lab

## 2011-07-09 DIAGNOSIS — D75839 Thrombocytosis, unspecified: Secondary | ICD-10-CM

## 2011-07-09 DIAGNOSIS — D473 Essential (hemorrhagic) thrombocythemia: Secondary | ICD-10-CM

## 2011-07-09 LAB — CBC WITH DIFFERENTIAL/PLATELET
BASO%: 1.2 % (ref 0.0–2.0)
Basophils Absolute: 0.1 10*3/uL (ref 0.0–0.1)
EOS%: 16.7 % — ABNORMAL HIGH (ref 0.0–7.0)
HCT: 40.1 % (ref 34.8–46.6)
HGB: 13.1 g/dL (ref 11.6–15.9)
MCH: 29.6 pg (ref 25.1–34.0)
MONO#: 0.6 10*3/uL (ref 0.1–0.9)
NEUT#: 5.5 10*3/uL (ref 1.5–6.5)
NEUT%: 60.9 % (ref 38.4–76.8)
RDW: 16.4 % — ABNORMAL HIGH (ref 11.2–14.5)
WBC: 9 10*3/uL (ref 3.9–10.3)
lymph#: 1.3 10*3/uL (ref 0.9–3.3)

## 2011-07-16 ENCOUNTER — Other Ambulatory Visit: Payer: Medicare Other | Admitting: Lab

## 2011-07-16 ENCOUNTER — Telehealth: Payer: Self-pay | Admitting: *Deleted

## 2011-07-16 ENCOUNTER — Other Ambulatory Visit (HOSPITAL_BASED_OUTPATIENT_CLINIC_OR_DEPARTMENT_OTHER): Payer: Medicare Other | Admitting: Lab

## 2011-07-16 ENCOUNTER — Encounter: Payer: Self-pay | Admitting: Oncology

## 2011-07-16 ENCOUNTER — Ambulatory Visit (HOSPITAL_BASED_OUTPATIENT_CLINIC_OR_DEPARTMENT_OTHER): Payer: Medicare Other | Admitting: Oncology

## 2011-07-16 VITALS — BP 111/66 | HR 89 | Temp 98.3°F | Ht 68.0 in | Wt 156.5 lb

## 2011-07-16 DIAGNOSIS — D473 Essential (hemorrhagic) thrombocythemia: Secondary | ICD-10-CM

## 2011-07-16 DIAGNOSIS — D75839 Thrombocytosis, unspecified: Secondary | ICD-10-CM

## 2011-07-16 LAB — CBC WITH DIFFERENTIAL/PLATELET
Basophils Absolute: 0.1 10*3/uL (ref 0.0–0.1)
Eosinophils Absolute: 1.6 10*3/uL — ABNORMAL HIGH (ref 0.0–0.5)
HGB: 12.8 g/dL (ref 11.6–15.9)
MONO#: 0.7 10*3/uL (ref 0.1–0.9)
NEUT#: 5.5 10*3/uL (ref 1.5–6.5)
Platelets: 413 10*3/uL — ABNORMAL HIGH (ref 145–400)
RBC: 4.28 10*6/uL (ref 3.70–5.45)
RDW: 17.1 % — ABNORMAL HIGH (ref 11.2–14.5)
WBC: 8.7 10*3/uL (ref 3.9–10.3)
nRBC: 0 % (ref 0–0)

## 2011-07-16 NOTE — Progress Notes (Signed)
CC: Deanna Hill. Little, M.D.  [name], M.D.   DIAGNOSIS:  71 year old female with essential thrombocytosis with a JAK2 mutation.  CURRENT THERAPY: Hydrea 500 mg daily with  Full-dose aspirin.  INTERVAL HISTORY:  The patient is seen in followup today.  She has been on hydroxyurea for now about 1 month. She overall is tolerating it nicely. Her platelets have come down quite a bit. Today her platelet count is 413,000. About 2 visits ago her platelets were in the 300,000 range. She did call us with complaints of some dizziness I reassured her that this was not coming from the [facility]. She has no focal neurologic deficits. I do think that patient still continues to have anxiety and depression due to the loss of her husband almost over a year ago. She otherwise denies any nausea vomiting fevers chills night sweats shortness of breath chest pain she has not complained of having dizziness at this visit office. She denies having any bleeding problems or any rashes.  REVIEW OF SYSTEMS:  Remainder of the 10-point review of systems is negative.[name]  MEDICATIONS: Reviewed and updated in EMR  PHYSICAL EXAMINATION:  GENERAL:  The patient is awake, alert.  She appears well.    Vital Signs:  Blood pressure 111/66, pulse 89, temperature 98.3 F (36.8 C), height 5\' 8"  (1.727 m), weight 156 lb 8 oz (70.988 kg).  HEENT Exam:  EOMI, PERRLA, sclerae anicteric, no conjunctival pallor.  Oral mucosa is moist.  Neck:  Supple.  Lungs:  Clear.  Cardiovascular:  Regular rate and rhythm.  No murmurs.  Abdomen:  Soft, nontender, no HSM.  Extremities:  No edema.  Neuro:  Patient is alert, oriented, otherwise nonfocal.  LABORATORY DATA:  Lab Results  Component Value Date   WBC 8.7 07/16/2011   HGB 12.8 07/16/2011   HCT 39.1 07/16/2011   MCV 91.2 07/16/2011   PLT 413* 07/16/2011    IMPRESSION AND PLAN:  71 year old female with:  1.  essential thrombocytosis with a JAK2 mutation.Patient is now on Hydrea 500 mg daily. She is  tolerating it quite well. We have been monitoring her platelet count on a weekly basis and I do think that at this point we can go to every 3 week monitoring. She knows to call me with any problems questions or concerns. I will plan on seeing her back in 3 month's time in followup. All questions were answered today I spent 30 minutes greater than 50% of the time was spent in counseling and coordination of care.  Drue Second, MD Medical/Oncology [facility] [phone] (beeper) [phone] (Office)

## 2011-07-16 NOTE — Telephone Encounter (Signed)
gave patienta appointment for labs only 08-06-2011 08-27-2011 09-18-2011 11-05-2011 printed out calendar and gave to the patient

## 2011-07-16 NOTE — Patient Instructions (Signed)
1. You are doing well.   2. Continue taking Hydrea 500 mg daily  3. We will check labs now every 3 weeks beginning on 5/23  4. I will see you back in my office on 11/05/11

## 2011-07-23 ENCOUNTER — Other Ambulatory Visit: Payer: Medicare Other | Admitting: Lab

## 2011-07-28 ENCOUNTER — Other Ambulatory Visit: Payer: Self-pay | Admitting: *Deleted

## 2011-07-28 DIAGNOSIS — D473 Essential (hemorrhagic) thrombocythemia: Secondary | ICD-10-CM

## 2011-07-28 MED ORDER — HYDROXYUREA 500 MG PO CAPS: 500.0000 mg | ORAL_CAPSULE | Freq: Every day | ORAL | Status: AC

## 2011-08-06 ENCOUNTER — Other Ambulatory Visit (HOSPITAL_BASED_OUTPATIENT_CLINIC_OR_DEPARTMENT_OTHER): Payer: Medicare Other | Admitting: Lab

## 2011-08-06 ENCOUNTER — Telehealth: Payer: Self-pay | Admitting: *Deleted

## 2011-08-06 DIAGNOSIS — D473 Essential (hemorrhagic) thrombocythemia: Secondary | ICD-10-CM

## 2011-08-06 DIAGNOSIS — D75839 Thrombocytosis, unspecified: Secondary | ICD-10-CM

## 2011-08-06 LAB — CBC WITH DIFFERENTIAL/PLATELET
BASO%: 1.4 % (ref 0.0–2.0)
Basophils Absolute: 0.1 10*3/uL (ref 0.0–0.1)
Eosinophils Absolute: 1.2 10*3/uL — ABNORMAL HIGH (ref 0.0–0.5)
HCT: 37 % (ref 34.8–46.6)
HGB: 12.2 g/dL (ref 11.6–15.9)
MONO#: 0.5 10*3/uL (ref 0.1–0.9)
NEUT#: 4.4 10*3/uL (ref 1.5–6.5)
NEUT%: 61.7 % (ref 38.4–76.8)
Platelets: 401 10*3/uL — ABNORMAL HIGH (ref 145–400)
WBC: 7.1 10*3/uL (ref 3.9–10.3)
lymph#: 0.9 10*3/uL (ref 0.9–3.3)

## 2011-08-06 NOTE — Telephone Encounter (Signed)
Message copied by Cooper Render on Thu Aug 06, 2011 10:55 AM ------      Message from: Deanna Hill      Created: Thu Aug 06, 2011 10:23 AM       Call patient: continue hydrea at the same dose

## 2011-08-06 NOTE — Telephone Encounter (Signed)
Per MD, notified Pt to continue same dose of Hydrea 500 daily. Pt confirmed and verbalized instructions.

## 2011-08-27 ENCOUNTER — Other Ambulatory Visit (HOSPITAL_BASED_OUTPATIENT_CLINIC_OR_DEPARTMENT_OTHER): Payer: Medicare Other

## 2011-08-27 ENCOUNTER — Telehealth: Payer: Self-pay | Admitting: *Deleted

## 2011-08-27 DIAGNOSIS — D473 Essential (hemorrhagic) thrombocythemia: Secondary | ICD-10-CM

## 2011-08-27 DIAGNOSIS — D75839 Thrombocytosis, unspecified: Secondary | ICD-10-CM

## 2011-08-27 LAB — CBC WITH DIFFERENTIAL/PLATELET
Basophils Absolute: 0.1 10*3/uL (ref 0.0–0.1)
Eosinophils Absolute: 1.1 10*3/uL — ABNORMAL HIGH (ref 0.0–0.5)
HCT: 37 % (ref 34.8–46.6)
HGB: 12 g/dL (ref 11.6–15.9)
MCV: 94.7 fL (ref 79.5–101.0)
MONO%: 7.1 % (ref 0.0–14.0)
NEUT#: 4 10*3/uL (ref 1.5–6.5)
NEUT%: 59.3 % (ref 38.4–76.8)
Platelets: 383 10*3/uL (ref 145–400)
RDW: 19.9 % — ABNORMAL HIGH (ref 11.2–14.5)

## 2011-08-27 NOTE — Telephone Encounter (Signed)
Message copied by Cooper Render on Thu Aug 27, 2011 11:54 AM ------      Message from: Victorino December      Created: Thu Aug 27, 2011 10:32 AM       Call patient: tell patient labs good keep same dose of hydrea

## 2011-08-27 NOTE — Telephone Encounter (Signed)
Notified pt per MD , labs good, keep same dose of hydrea- 500mg  daily Pt verbalized understanding.

## 2011-09-18 ENCOUNTER — Telehealth: Payer: Self-pay | Admitting: Medical Oncology

## 2011-09-18 ENCOUNTER — Other Ambulatory Visit (HOSPITAL_BASED_OUTPATIENT_CLINIC_OR_DEPARTMENT_OTHER): Payer: Medicare Other | Admitting: Lab

## 2011-09-18 DIAGNOSIS — D75839 Thrombocytosis, unspecified: Secondary | ICD-10-CM

## 2011-09-18 DIAGNOSIS — D473 Essential (hemorrhagic) thrombocythemia: Secondary | ICD-10-CM

## 2011-09-18 LAB — CBC WITH DIFFERENTIAL/PLATELET
Basophils Absolute: 0.1 10*3/uL (ref 0.0–0.1)
Eosinophils Absolute: 1.1 10*3/uL — ABNORMAL HIGH (ref 0.0–0.5)
LYMPH%: 14.9 % (ref 14.0–49.7)
MCV: 97.3 fL (ref 79.5–101.0)
MONO%: 6.7 % (ref 0.0–14.0)
NEUT#: 4.2 10*3/uL (ref 1.5–6.5)
Platelets: 342 10*3/uL (ref 145–400)
RBC: 3.6 10*6/uL — ABNORMAL LOW (ref 3.70–5.45)

## 2011-09-18 NOTE — Telephone Encounter (Signed)
Message copied by Tylene Fantasia on Fri Sep 18, 2011 12:11 PM ------      Message from: Victorino December      Created: Fri Sep 18, 2011 11:26 AM       Please call patient: labs good [name]

## 2011-09-18 NOTE — Telephone Encounter (Signed)
Per MD, labs look good, please continue taking current dose of Hydrea.  Platelet count 342.  Patient expressed understanding, no further questions at this time.  Instructed patient to call clinic with any questions or concerns.

## 2011-10-08 ENCOUNTER — Other Ambulatory Visit (HOSPITAL_BASED_OUTPATIENT_CLINIC_OR_DEPARTMENT_OTHER): Payer: Medicare Other | Admitting: Lab

## 2011-10-08 DIAGNOSIS — D473 Essential (hemorrhagic) thrombocythemia: Secondary | ICD-10-CM

## 2011-10-08 DIAGNOSIS — D75839 Thrombocytosis, unspecified: Secondary | ICD-10-CM

## 2011-10-08 LAB — CBC WITH DIFFERENTIAL/PLATELET
BASO%: 1.2 % (ref 0.0–2.0)
EOS%: 12.8 % — ABNORMAL HIGH (ref 0.0–7.0)
LYMPH%: 14.1 % (ref 14.0–49.7)
MCHC: 33.2 g/dL (ref 31.5–36.0)
MCV: 100.6 fL (ref 79.5–101.0)
MONO%: 7.8 % (ref 0.0–14.0)
Platelets: 335 10*3/uL (ref 145–400)
RBC: 3.5 10*6/uL — ABNORMAL LOW (ref 3.70–5.45)
WBC: 6.6 10*3/uL (ref 3.9–10.3)

## 2011-10-26 ENCOUNTER — Other Ambulatory Visit: Payer: Self-pay | Admitting: Oncology

## 2011-11-05 ENCOUNTER — Telehealth: Payer: Self-pay | Admitting: Oncology

## 2011-11-05 ENCOUNTER — Encounter: Payer: Self-pay | Admitting: Oncology

## 2011-11-05 ENCOUNTER — Ambulatory Visit (HOSPITAL_BASED_OUTPATIENT_CLINIC_OR_DEPARTMENT_OTHER): Payer: Medicare Other | Admitting: Oncology

## 2011-11-05 ENCOUNTER — Other Ambulatory Visit (HOSPITAL_BASED_OUTPATIENT_CLINIC_OR_DEPARTMENT_OTHER): Payer: Medicare Other | Admitting: Lab

## 2011-11-05 VITALS — BP 114/70 | HR 88 | Temp 99.3°F | Resp 20 | Ht 68.0 in | Wt 160.1 lb

## 2011-11-05 DIAGNOSIS — D473 Essential (hemorrhagic) thrombocythemia: Secondary | ICD-10-CM

## 2011-11-05 DIAGNOSIS — D75839 Thrombocytosis, unspecified: Secondary | ICD-10-CM

## 2011-11-05 LAB — CBC WITH DIFFERENTIAL/PLATELET
BASO%: 1.4 % (ref 0.0–2.0)
LYMPH%: 13 % — ABNORMAL LOW (ref 14.0–49.7)
MCHC: 33.9 g/dL (ref 31.5–36.0)
MONO#: 0.4 10*3/uL (ref 0.1–0.9)
RBC: 3.35 10*6/uL — ABNORMAL LOW (ref 3.70–5.45)
WBC: 6.5 10*3/uL (ref 3.9–10.3)
lymph#: 0.8 10*3/uL — ABNORMAL LOW (ref 0.9–3.3)

## 2011-11-05 LAB — COMPREHENSIVE METABOLIC PANEL
ALT: 28 U/L (ref 0–35)
BUN: 22 mg/dL (ref 6–23)
CO2: 29 mEq/L (ref 19–32)
Creatinine, Ser: 0.92 mg/dL (ref 0.50–1.10)
Glucose, Bld: 90 mg/dL (ref 70–99)
Total Bilirubin: 0.4 mg/dL (ref 0.3–1.2)

## 2011-11-05 MED ORDER — HYDROXYUREA 500 MG PO CAPS: 500.0000 mg | ORAL_CAPSULE | Freq: Every day | ORAL | Status: DC

## 2011-11-05 NOTE — Progress Notes (Signed)
CC: Deanna Hill. Little, M.D.  [name], M.D.   DIAGNOSIS:  71 year old female with essential thrombocytosis with a JAK2 mutation.  CURRENT THERAPY: Hydrea 500 mg daily with  Full-dose aspirin.  INTERVAL HISTORY:  The patient is seen in followup today. Patient is doing well. She remains very sad this is anniversary of her husband's staff as well as her son. Her 49th anniversary would have been sometime in September as well. She is tolerating the hydroxyurea well without any significant problems. She is concerned about varicose veins of her legs. She's thinking about the possibility of having laser surgery performed sometime in the wintertime. She has no nausea vomiting fevers chills or night sweats no headaches no shortness of breath or chest pains. She denies having any peripheral paresthesias she has no rashes. Remainder of the 10 point review of systems is negative.  REVIEW OF SYSTEMS:  Remainder of the 10-point review of systems is negative.[name]  MEDICATIONS: Reviewed and updated in EMR  PHYSICAL EXAMINATION:  GENERAL:  The patient is awake, alert.  She appears well.    Vital Signs:  Blood pressure 114/70, pulse 88, temperature 99.3 F (37.4 C), temperature source Oral, resp. rate 20, height 5\' 8"  (1.727 m), weight 160 lb 1.6 oz (72.621 kg).  HEENT Exam:  EOMI, PERRLA, sclerae anicteric, no conjunctival pallor.  Oral mucosa is moist.  Neck:  Supple.  Lungs:  Clear.  Cardiovascular:  Regular rate and rhythm.  No murmurs.  Abdomen:  Soft, nontender, no HSM.  Extremities:  No edema.  Neuro:  Patient is alert, oriented, otherwise nonfocal.  LABORATORY DATA:  Lab Results  Component Value Date   WBC 6.5 11/05/2011   HGB 11.8 11/05/2011   HCT 34.7* 11/05/2011   MCV 103.5* 11/05/2011   PLT 311 11/05/2011    IMPRESSION AND PLAN:  71 year old female with:  1.  essential thrombocytosis with a JAK2 mutation.Patient is now on Hydrea 500 mg daily. She is tolerating it quite well. We have been  monitoring her platelet count on Monthly basis and I do think That we will keep doing. She knows to call me with any problems questions or concerns. I will plan on seeing her back in 4 month's time in followup. All questions were answered today I spent 30 minutes greater than 50% of the time was spent in counseling and coordination of care.  Drue Second, MD Medical/Oncology [facility] [phone] (beeper) [phone] (Office)

## 2011-11-05 NOTE — Patient Instructions (Addendum)
Doing well  I will see you back in 4 months  Continue to cbc monthly

## 2011-11-05 NOTE — Telephone Encounter (Signed)
gve the pt her sept-dec 2013 appt calendars

## 2011-11-06 ENCOUNTER — Telehealth: Payer: Self-pay | Admitting: *Deleted

## 2011-11-06 NOTE — Telephone Encounter (Signed)
Per MD, lmovm pt to keep same dose Hydrea 500mg  Daily. Pt to call back to confirm msg received

## 2011-11-06 NOTE — Telephone Encounter (Signed)
Message copied by Cooper Render on Fri Nov 06, 2011 10:42 AM ------      Message from: Deanna Hill      Created: Thu Nov 05, 2011  9:24 AM       Call patient: keep same dose of hydrea

## 2011-12-03 ENCOUNTER — Other Ambulatory Visit (HOSPITAL_BASED_OUTPATIENT_CLINIC_OR_DEPARTMENT_OTHER): Payer: Medicare Other | Admitting: Lab

## 2011-12-03 DIAGNOSIS — D75839 Thrombocytosis, unspecified: Secondary | ICD-10-CM

## 2011-12-03 DIAGNOSIS — D473 Essential (hemorrhagic) thrombocythemia: Secondary | ICD-10-CM

## 2011-12-03 LAB — CBC WITH DIFFERENTIAL/PLATELET
Basophils Absolute: 0.1 10*3/uL (ref 0.0–0.1)
Eosinophils Absolute: 1 10*3/uL — ABNORMAL HIGH (ref 0.0–0.5)
HCT: 35.6 % (ref 34.8–46.6)
HGB: 12.2 g/dL (ref 11.6–15.9)
LYMPH%: 14.9 % (ref 14.0–49.7)
MONO#: 0.4 10*3/uL (ref 0.1–0.9)
NEUT#: 3.5 10*3/uL (ref 1.5–6.5)
NEUT%: 58.6 % (ref 38.4–76.8)
Platelets: 299 10*3/uL (ref 145–400)
WBC: 6 10*3/uL (ref 3.9–10.3)
lymph#: 0.9 10*3/uL (ref 0.9–3.3)

## 2011-12-04 ENCOUNTER — Telehealth: Payer: Self-pay | Admitting: *Deleted

## 2011-12-04 NOTE — Telephone Encounter (Signed)
Message copied by GARNER, Gerald Leitz on Fri Dec 04, 2011  2:23 PM ------      Message from: Victorino December      Created: Thu Dec 03, 2011  9:20 PM       Call patient: keep same dose of hydrea

## 2011-12-04 NOTE — Telephone Encounter (Signed)
Per MD, notified pt to continue same dose of Hydrea. Pt verbalized understanding.

## 2011-12-31 ENCOUNTER — Other Ambulatory Visit (HOSPITAL_BASED_OUTPATIENT_CLINIC_OR_DEPARTMENT_OTHER): Payer: Medicare Other | Admitting: Lab

## 2011-12-31 DIAGNOSIS — D75839 Thrombocytosis, unspecified: Secondary | ICD-10-CM

## 2011-12-31 DIAGNOSIS — D473 Essential (hemorrhagic) thrombocythemia: Secondary | ICD-10-CM

## 2011-12-31 LAB — CBC WITH DIFFERENTIAL/PLATELET
BASO%: 1.3 % (ref 0.0–2.0)
Basophils Absolute: 0.1 10*3/uL (ref 0.0–0.1)
EOS%: 13.2 % — ABNORMAL HIGH (ref 0.0–7.0)
HCT: 35.8 % (ref 34.8–46.6)
HGB: 12.2 g/dL (ref 11.6–15.9)
LYMPH%: 16.1 % (ref 14.0–49.7)
MCH: 35.4 pg — ABNORMAL HIGH (ref 25.1–34.0)
MCHC: 34.1 g/dL (ref 31.5–36.0)
NEUT%: 60.9 % (ref 38.4–76.8)
Platelets: 320 10*3/uL (ref 145–400)

## 2012-01-05 ENCOUNTER — Telehealth: Payer: Self-pay | Admitting: *Deleted

## 2012-01-05 NOTE — Telephone Encounter (Signed)
Per MD, notified pt to continue same dose of Hydrea 500mg  Daily.pt verbalized understanding

## 2012-01-05 NOTE — Telephone Encounter (Signed)
Message copied by Cooper Render on Tue Jan 05, 2012 10:33 AM ------      Message from: Deanna Hill      Created: Tue Jan 05, 2012 12:03 AM       Call patient:keep same dose of hydrea, recheck as scheduled

## 2012-02-04 ENCOUNTER — Telehealth: Payer: Self-pay | Admitting: *Deleted

## 2012-02-04 ENCOUNTER — Other Ambulatory Visit (HOSPITAL_BASED_OUTPATIENT_CLINIC_OR_DEPARTMENT_OTHER): Payer: Medicare Other | Admitting: Lab

## 2012-02-04 DIAGNOSIS — D75839 Thrombocytosis, unspecified: Secondary | ICD-10-CM

## 2012-02-04 DIAGNOSIS — D473 Essential (hemorrhagic) thrombocythemia: Secondary | ICD-10-CM

## 2012-02-04 LAB — CBC WITH DIFFERENTIAL/PLATELET
BASO%: 1.1 % (ref 0.0–2.0)
Basophils Absolute: 0.1 10*3/uL (ref 0.0–0.1)
EOS%: 15.3 % — ABNORMAL HIGH (ref 0.0–7.0)
HCT: 34.9 % (ref 34.8–46.6)
HGB: 12 g/dL (ref 11.6–15.9)
MCH: 35.6 pg — ABNORMAL HIGH (ref 25.1–34.0)
MCHC: 34.3 g/dL (ref 31.5–36.0)
MCV: 103.8 fL — ABNORMAL HIGH (ref 79.5–101.0)
MONO%: 7.6 % (ref 0.0–14.0)
NEUT%: 62.5 % (ref 38.4–76.8)
lymph#: 0.8 10*3/uL — ABNORMAL LOW (ref 0.9–3.3)

## 2012-02-04 NOTE — Telephone Encounter (Signed)
Message copied by Cooper Render on Thu Feb 04, 2012  4:46 PM ------      Message from: Deanna Hill      Created: Thu Feb 04, 2012  2:42 PM       Call patient:keep same dose of hydrea

## 2012-02-04 NOTE — Telephone Encounter (Signed)
Per MD, notified pt to continue same dose of Hydrea 500mg  Daily.Recheck labs as scheduled. Pt verbalized understanding.

## 2012-02-25 ENCOUNTER — Encounter: Payer: Self-pay | Admitting: Adult Health

## 2012-02-25 ENCOUNTER — Ambulatory Visit (HOSPITAL_BASED_OUTPATIENT_CLINIC_OR_DEPARTMENT_OTHER): Payer: Medicare Other | Admitting: Adult Health

## 2012-02-25 ENCOUNTER — Other Ambulatory Visit (HOSPITAL_BASED_OUTPATIENT_CLINIC_OR_DEPARTMENT_OTHER): Payer: Medicare Other

## 2012-02-25 ENCOUNTER — Telehealth: Payer: Self-pay | Admitting: Oncology

## 2012-02-25 VITALS — BP 138/65 | HR 93 | Temp 98.2°F | Resp 20 | Ht 68.0 in | Wt 166.1 lb

## 2012-02-25 DIAGNOSIS — D75839 Thrombocytosis, unspecified: Secondary | ICD-10-CM

## 2012-02-25 DIAGNOSIS — D473 Essential (hemorrhagic) thrombocythemia: Secondary | ICD-10-CM

## 2012-02-25 DIAGNOSIS — R5383 Other fatigue: Secondary | ICD-10-CM

## 2012-02-25 DIAGNOSIS — R5381 Other malaise: Secondary | ICD-10-CM

## 2012-02-25 LAB — CBC WITH DIFFERENTIAL/PLATELET
BASO%: 1.1 % (ref 0.0–2.0)
EOS%: 13.9 % — ABNORMAL HIGH (ref 0.0–7.0)
HGB: 11.9 g/dL (ref 11.6–15.9)
MCH: 35.1 pg — ABNORMAL HIGH (ref 25.1–34.0)
MCHC: 33.9 g/dL (ref 31.5–36.0)
MCV: 103.4 fL — ABNORMAL HIGH (ref 79.5–101.0)
MONO%: 7.3 % (ref 0.0–14.0)
RBC: 3.4 10*6/uL — ABNORMAL LOW (ref 3.70–5.45)
RDW: 13.3 % (ref 11.2–14.5)
lymph#: 1.1 10*3/uL (ref 0.9–3.3)

## 2012-02-25 LAB — COMPREHENSIVE METABOLIC PANEL (CC13)
ALT: 19 U/L (ref 0–55)
AST: 17 U/L (ref 5–34)
Albumin: 4 g/dL (ref 3.5–5.0)
Alkaline Phosphatase: 49 U/L (ref 40–150)
BUN: 20 mg/dL (ref 7.0–26.0)
Calcium: 9.7 mg/dL (ref 8.4–10.4)
Chloride: 104 mEq/L (ref 98–107)
Potassium: 4.8 mEq/L (ref 3.5–5.1)
Sodium: 140 mEq/L (ref 136–145)
Total Protein: 7.1 g/dL (ref 6.4–8.3)

## 2012-02-25 NOTE — Telephone Encounter (Signed)
gve the pt her jan-April 2014 appt calendar

## 2012-02-25 NOTE — Progress Notes (Signed)
CC: Anna Genre. Little, M.D.  [name], M.D.   DIAGNOSIS:  71 year old female with essential thrombocytosis with a JAK2 mutation.  CURRENT THERAPY: Hydrea 500 mg daily with  Full-dose aspirin.  INTERVAL HISTORY:  [name] is doing well today.  She's been taking her Hydrea daily and has been tolerating it well.  She's followed up monthly for her lab checks, which have remained stable.  Shes very sad about her husbands death 1 year ago.  She's c/o fatigue and falling asleep during the day easily.  She also states that she feels very bored and she's lost a huge part of her life when her husband passed.    REVIEW OF SYSTEMS:  General: fatigue (+), night sweats (-), fever (-), pain (-) Lymph: palpable nodes (-) HEENT: vision changes (-), mucositis (-), gum bleeding (-), epistaxis (-) Cardiovascular: chest pain (-), palpitations (-) Pulmonary: shortness of breath (-), dyspnea on exertion (-), cough (-), hemoptysis (-) GI:  Early satiety (-), melena (-), dysphagia (-), nausea/vomiting (-), diarrhea (-) GU: dysuria (-), hematuria (-), incontinence (-) Musculoskeletal: joint swelling (-), joint pain (-), back pain (-) Neuro: weakness (-), numbness (-), headache (-), confusion (-) Skin: Rash (-), lesions (-), dryness (-) Psych: depression (-), suicidal/homicidal ideation (-), feeling of hopelessness (-)   MEDICATIONS:  Current Outpatient Prescriptions  Medication Sig Dispense Refill  . ALPRAZolam (XANAX) 0.25 MG tablet Take 0.25 mg by mouth at bedtime as needed.        [name] aspirin 325 MG tablet Take 325 mg by mouth daily.        . clidinium-chlordiazePOXIDE (LIBRAX) 2.5-5 MG per capsule Take 1 capsule by mouth 3 (three) times daily as needed.        . Evening Primrose topical oil Apply topically as needed.        . hydroxyurea (HYDREA) 500 MG capsule Take 1 capsule (500 mg total) by mouth daily. May take with food to minimize GI side effects.  30 capsule  5  . levothyroxine (SYNTHROID,  LEVOTHROID) 100 MCG tablet Take 100 mcg by mouth daily.        . Multiple Vitamin (MULTIVITAMIN) tablet Take 1 tablet by mouth daily.        . ranitidine (ZANTAC) 150 MG capsule Take 150 mg by mouth 2 (two) times daily.        . Vitamin D, Ergocalciferol, (DRISDOL) 50000 UNITS CAPS Take 50,000 Units by mouth.        . Calcium Carbonate-Vitamin D (CALCIUM-VITAMIN D) 500-200 MG-UNIT per tablet Take 1 tablet by mouth 2 (two) times daily with a meal.        . fish oil-omega-3 fatty acids 1000 MG capsule Take 2 g by mouth daily.        [name] ibuprofen (ADVIL,MOTRIN) 200 MG tablet Take 200 mg by mouth every 6 (six) hours as needed.           PHYSICAL EXAMINATION:    Blood pressure 138/65, pulse 93, temperature 98.2 F (36.8 C), resp. rate 20, height 5\' 8"  (1.727 m), weight 166 lb 1.6 oz (75.342 kg). General: Patient is a well appearing female in no acute distress HEENT: PERRLA, sclerae anicteric no conjunctival pallor, MMM Neck: supple, no palpable adenopathy Lungs: clear to auscultation bilaterally, no wheezes, rhonchi, or rales Cardiovascular: regular rate rhythm, S1, S2, no murmurs, rubs or gallops Abdomen: Soft, non-tender, non-distended, normoactive bowel sounds, no HSM Extremities: warm and well perfused, no clubbing, cyanosis, or edema Skin: No rashes  or lesions Neuro: Non-focal    LABORATORY DATA:  Lab Results  Component Value Date   WBC 7.4 02/25/2012   HGB 11.9 02/25/2012   HCT 35.2 02/25/2012   MCV 103.4* 02/25/2012   PLT 331 02/25/2012    IMPRESSION AND PLAN:  71 year old female with:  1.  Essential thrombocytosis with a JAK2 mutation.Patient is now on Hydrea 500 mg daily. She is tolerating it quite well. We have been monitoring her platelet count on Monthly basis and I do think That we will keep doing. She knows to call me with any problems questions or concerns. I will plan on seeing her back in 4 month's time in followup.  She will call us in the interim for any questions or  concerns.  Her fatigue is likely related to her grief over the loss of her husband.    All questions were answered today I spent 30 minutes greater than 50% of the time was spent in counseling and coordination of care.  This case was reviewed with Dr. Welton Flakes.    Cherie Ouch Lyn Hollingshead, NP Medical Oncology [facility] Phone: [phone]

## 2012-02-25 NOTE — Patient Instructions (Addendum)
Doing well.  We will see you every month for lab work, and in 4 months for an appointment.  Continue your Hydrea as prescribed.  Please call us if you have any questions or concerns.

## 2012-03-03 ENCOUNTER — Other Ambulatory Visit: Payer: Medicare Other | Admitting: Lab

## 2012-03-24 ENCOUNTER — Other Ambulatory Visit (HOSPITAL_BASED_OUTPATIENT_CLINIC_OR_DEPARTMENT_OTHER): Payer: Medicare Other | Admitting: Lab

## 2012-03-24 DIAGNOSIS — D75839 Thrombocytosis, unspecified: Secondary | ICD-10-CM

## 2012-03-24 DIAGNOSIS — D473 Essential (hemorrhagic) thrombocythemia: Secondary | ICD-10-CM

## 2012-03-24 LAB — CBC WITH DIFFERENTIAL/PLATELET
BASO%: 1.3 % (ref 0.0–2.0)
EOS%: 13.8 % — ABNORMAL HIGH (ref 0.0–7.0)
MCH: 35.5 pg — ABNORMAL HIGH (ref 25.1–34.0)
MCHC: 34 g/dL (ref 31.5–36.0)
RBC: 3.53 10*6/uL — ABNORMAL LOW (ref 3.70–5.45)
RDW: 13.3 % (ref 11.2–14.5)
lymph#: 1 10*3/uL (ref 0.9–3.3)

## 2012-03-28 ENCOUNTER — Telehealth: Payer: Self-pay | Admitting: *Deleted

## 2012-03-28 NOTE — Telephone Encounter (Signed)
Lab results reviewed by MD, notified pt per MD to  continue Hydrea 500mg  daily,  keep scheduled appts. Pt verbalized understanding. No further questions

## 2012-04-28 ENCOUNTER — Other Ambulatory Visit: Payer: Medicare Other | Admitting: Lab

## 2012-05-26 ENCOUNTER — Other Ambulatory Visit (HOSPITAL_BASED_OUTPATIENT_CLINIC_OR_DEPARTMENT_OTHER): Payer: Medicare Other | Admitting: Lab

## 2012-05-26 DIAGNOSIS — D75839 Thrombocytosis, unspecified: Secondary | ICD-10-CM

## 2012-05-26 DIAGNOSIS — D473 Essential (hemorrhagic) thrombocythemia: Secondary | ICD-10-CM

## 2012-05-26 LAB — CBC WITH DIFFERENTIAL/PLATELET
Basophils Absolute: 0.1 10*3/uL (ref 0.0–0.1)
EOS%: 15.6 % — ABNORMAL HIGH (ref 0.0–7.0)
Eosinophils Absolute: 1.1 10*3/uL — ABNORMAL HIGH (ref 0.0–0.5)
HGB: 12.1 g/dL (ref 11.6–15.9)
NEUT#: 4.2 10*3/uL (ref 1.5–6.5)
RDW: 13.6 % (ref 11.2–14.5)
lymph#: 1.1 10*3/uL (ref 0.9–3.3)

## 2012-06-23 ENCOUNTER — Other Ambulatory Visit: Payer: Medicare Other | Admitting: Lab

## 2012-06-24 ENCOUNTER — Encounter: Payer: Self-pay | Admitting: Oncology

## 2012-06-24 ENCOUNTER — Ambulatory Visit (HOSPITAL_BASED_OUTPATIENT_CLINIC_OR_DEPARTMENT_OTHER): Payer: Medicare Other | Admitting: Oncology

## 2012-06-24 ENCOUNTER — Other Ambulatory Visit (HOSPITAL_BASED_OUTPATIENT_CLINIC_OR_DEPARTMENT_OTHER): Payer: Medicare Other | Admitting: Lab

## 2012-06-24 ENCOUNTER — Telehealth: Payer: Self-pay | Admitting: Oncology

## 2012-06-24 VITALS — BP 111/68 | HR 91 | Temp 98.5°F | Resp 20 | Ht 68.0 in | Wt 168.0 lb

## 2012-06-24 DIAGNOSIS — R5383 Other fatigue: Secondary | ICD-10-CM

## 2012-06-24 DIAGNOSIS — R5381 Other malaise: Secondary | ICD-10-CM

## 2012-06-24 DIAGNOSIS — D473 Essential (hemorrhagic) thrombocythemia: Secondary | ICD-10-CM

## 2012-06-24 DIAGNOSIS — D75839 Thrombocytosis, unspecified: Secondary | ICD-10-CM

## 2012-06-24 LAB — COMPREHENSIVE METABOLIC PANEL (CC13)
Albumin: 3.7 g/dL (ref 3.5–5.0)
Alkaline Phosphatase: 57 U/L (ref 40–150)
BUN: 17.9 mg/dL (ref 7.0–26.0)
CO2: 26 mEq/L (ref 22–29)
Calcium: 9.1 mg/dL (ref 8.4–10.4)
Chloride: 107 mEq/L (ref 98–107)
Glucose: 81 mg/dl (ref 70–99)
Potassium: 4.3 mEq/L (ref 3.5–5.1)

## 2012-06-24 LAB — CBC WITH DIFFERENTIAL/PLATELET
Basophils Absolute: 0.1 10*3/uL (ref 0.0–0.1)
Eosinophils Absolute: 0.9 10*3/uL — ABNORMAL HIGH (ref 0.0–0.5)
HGB: 11.8 g/dL (ref 11.6–15.9)
MCV: 106.1 fL — ABNORMAL HIGH (ref 79.5–101.0)
MONO#: 0.4 10*3/uL (ref 0.1–0.9)
MONO%: 7.6 % (ref 0.0–14.0)
NEUT#: 3.6 10*3/uL (ref 1.5–6.5)
RDW: 13.5 % (ref 11.2–14.5)

## 2012-06-24 MED ORDER — HYDROXYUREA 500 MG PO CAPS: 500.0000 mg | ORAL_CAPSULE | Freq: Every day | ORAL | Status: DC

## 2012-06-24 NOTE — Progress Notes (Signed)
CC: Anna Genre. Little, M.D.  [name], M.D.   DIAGNOSIS:  72 year old female with essential thrombocytosis with a JAK2 mutation.  CURRENT THERAPY: Hydrea 500 mg daily with  Full-dose aspirin.  INTERVAL HISTORY:  Ms. Dula is doing well today.  She's been taking her Hydrea daily and has been tolerating it well.  She's followed up monthly for her lab checks, which have remained stable.  Shes very sad about her husbands death 1 year ago.  She's c/o fatigue and falling asleep during the day easily.  She also states that she feels very bored and she's lost a huge part of her life when her husband passed.    REVIEW OF SYSTEMS:  General: fatigue (+), night sweats (-), fever (-), pain (-) Lymph: palpable nodes (-) HEENT: vision changes (-), mucositis (-), gum bleeding (-), epistaxis (-) Cardiovascular: chest pain (-), palpitations (-) Pulmonary: shortness of breath (-), dyspnea on exertion (-), cough (-), hemoptysis (-) GI:  Early satiety (-), melena (-), dysphagia (-), nausea/vomiting (-), diarrhea (-) GU: dysuria (-), hematuria (-), incontinence (-) Musculoskeletal: joint swelling (-), joint pain (-), back pain (-) Neuro: weakness (-), numbness (-), headache (-), confusion (-) Skin: Rash (-), lesions (-), dryness (-) Psych: depression (-), suicidal/homicidal ideation (-), feeling of hopelessness (-)   MEDICATIONS:  Current Outpatient Prescriptions  Medication Sig Dispense Refill  . ALPRAZolam (XANAX) 0.25 MG tablet Take 0.25 mg by mouth at bedtime as needed.        [name] aspirin 325 MG tablet Take 325 mg by mouth daily.        . Calcium Carbonate-Vitamin D (CALCIUM-VITAMIN D) 500-200 MG-UNIT per tablet Take 1 tablet by mouth 2 (two) times daily with a meal.        . clidinium-chlordiazePOXIDE (LIBRAX) 2.5-5 MG per capsule Take 1 capsule by mouth 3 (three) times daily as needed.        . Evening Primrose topical oil Apply topically as needed.        . hydroxyurea (HYDREA) 500 MG capsule Take 1  capsule (500 mg total) by mouth daily. May take with food to minimize GI side effects.  30 capsule  5  . ibuprofen (ADVIL,MOTRIN) 200 MG tablet Take 200 mg by mouth every 6 (six) hours as needed.        [name] levothyroxine (SYNTHROID, LEVOTHROID) 100 MCG tablet Take 100 mcg by mouth daily.        . Multiple Vitamin (MULTIVITAMIN) tablet Take 1 tablet by mouth daily.        . ranitidine (ZANTAC) 150 MG capsule Take 150 mg by mouth 2 (two) times daily.        . Vitamin D, Ergocalciferol, (DRISDOL) 50000 UNITS CAPS Take 50,000 Units by mouth.         No current facility-administered medications for this visit.     PHYSICAL EXAMINATION:    Blood pressure 111/68, pulse 91, temperature 98.5 F (36.9 C), temperature source Oral, resp. rate 20, height 5\' 8"  (1.727 m), weight 168 lb (76.204 kg). General: Patient is a well appearing female in no acute distress HEENT: PERRLA, sclerae anicteric no conjunctival pallor, MMM Neck: supple, no palpable adenopathy Lungs: clear to auscultation bilaterally, no wheezes, rhonchi, or rales Cardiovascular: regular rate rhythm, S1, S2, no murmurs, rubs or gallops Abdomen: Soft, non-tender, non-distended, normoactive bowel sounds, no HSM Extremities: warm and well perfused, no clubbing, cyanosis, or edema Skin: No rashes or lesions Neuro: Non-focal    LABORATORY DATA:  Lab  Results  Component Value Date   WBC 5.8 06/24/2012   HGB 11.8 06/24/2012   HCT 36.6 06/24/2012   MCV 106.1* 06/24/2012   PLT 297 06/24/2012    IMPRESSION AND PLAN:  72 year old female with:  1.  Essential thrombocytosis with a JAK2 mutation.Patient is now on Hydrea 500 mg daily. She is tolerating it quite well.  She will call us in the interim for any questions or concerns.  Her fatigue is likely related to her grief over the loss of her husband.    All questions were answered today I spent 30 minutes greater than 50% of the time was spent in counseling and coordination of care.  Drue Second, MD Medical/Oncology [facility] [phone] (beeper) [phone] (Office)  06/24/2012, 10:31 AM

## 2012-06-24 NOTE — Patient Instructions (Addendum)
Doing well   Continue Hydrea 500 mg daily  We will check your blood counts every other month   We will see you back in 6 month

## 2012-06-28 ENCOUNTER — Telehealth: Payer: Self-pay

## 2012-06-28 NOTE — Telephone Encounter (Signed)
Spoke with pt regarding lab results. Per LA, remain on current dose off 500mg  Hydrea. Keep scheduled appts. Pt voiced understanding and had no questions.

## 2012-06-28 NOTE — Telephone Encounter (Signed)
Spoke with pt regarding

## 2012-08-25 ENCOUNTER — Telehealth: Payer: Self-pay | Admitting: *Deleted

## 2012-08-25 ENCOUNTER — Other Ambulatory Visit: Payer: Medicare Other

## 2012-08-25 NOTE — Telephone Encounter (Signed)
sw pt she stated she was too sick to come in today and wanted to r/s. gv appt d/t for 08/26/12@10am ...td

## 2012-08-26 ENCOUNTER — Other Ambulatory Visit (HOSPITAL_BASED_OUTPATIENT_CLINIC_OR_DEPARTMENT_OTHER): Payer: Medicare Other | Admitting: Lab

## 2012-08-26 DIAGNOSIS — D473 Essential (hemorrhagic) thrombocythemia: Secondary | ICD-10-CM

## 2012-08-26 DIAGNOSIS — D75839 Thrombocytosis, unspecified: Secondary | ICD-10-CM

## 2012-08-26 LAB — CBC WITH DIFFERENTIAL/PLATELET
Basophils Absolute: 0.1 10*3/uL (ref 0.0–0.1)
Eosinophils Absolute: 0.9 10*3/uL — ABNORMAL HIGH (ref 0.0–0.5)
HGB: 11.9 g/dL (ref 11.6–15.9)
MONO#: 0.5 10*3/uL (ref 0.1–0.9)
NEUT#: 4.4 10*3/uL (ref 1.5–6.5)
RBC: 3.36 10*6/uL — ABNORMAL LOW (ref 3.70–5.45)
RDW: 13.4 % (ref 11.2–14.5)
WBC: 6.9 10*3/uL (ref 3.9–10.3)
lymph#: 1 10*3/uL (ref 0.9–3.3)

## 2012-10-27 ENCOUNTER — Other Ambulatory Visit (HOSPITAL_BASED_OUTPATIENT_CLINIC_OR_DEPARTMENT_OTHER): Payer: Medicare Other

## 2012-10-27 DIAGNOSIS — D75839 Thrombocytosis, unspecified: Secondary | ICD-10-CM

## 2012-10-27 DIAGNOSIS — D473 Essential (hemorrhagic) thrombocythemia: Secondary | ICD-10-CM

## 2012-10-27 LAB — CBC WITH DIFFERENTIAL/PLATELET
Basophils Absolute: 0.1 10*3/uL (ref 0.0–0.1)
Eosinophils Absolute: 0.8 10*3/uL — ABNORMAL HIGH (ref 0.0–0.5)
HGB: 11.8 g/dL (ref 11.6–15.9)
LYMPH%: 12.8 % — ABNORMAL LOW (ref 14.0–49.7)
MCV: 104.2 fL — ABNORMAL HIGH (ref 79.5–101.0)
MONO#: 0.4 10*3/uL (ref 0.1–0.9)
MONO%: 6.7 % (ref 0.0–14.0)
NEUT#: 4.2 10*3/uL (ref 1.5–6.5)
Platelets: 312 10*3/uL (ref 145–400)
WBC: 6.4 10*3/uL (ref 3.9–10.3)

## 2012-11-01 ENCOUNTER — Telehealth: Payer: Self-pay | Admitting: Medical Oncology

## 2012-11-01 NOTE — Telephone Encounter (Signed)
Patient called for lab results from 08/14, results given. Denies questions.

## 2012-12-29 ENCOUNTER — Encounter (INDEPENDENT_AMBULATORY_CARE_PROVIDER_SITE_OTHER): Payer: Self-pay

## 2012-12-29 ENCOUNTER — Ambulatory Visit (HOSPITAL_BASED_OUTPATIENT_CLINIC_OR_DEPARTMENT_OTHER): Payer: Medicare Other | Admitting: Lab

## 2012-12-29 ENCOUNTER — Other Ambulatory Visit (HOSPITAL_BASED_OUTPATIENT_CLINIC_OR_DEPARTMENT_OTHER): Payer: Medicare Other | Admitting: Lab

## 2012-12-29 ENCOUNTER — Ambulatory Visit: Payer: Medicare Other | Admitting: Oncology

## 2012-12-29 ENCOUNTER — Telehealth: Payer: Self-pay | Admitting: Adult Health

## 2012-12-29 ENCOUNTER — Ambulatory Visit (HOSPITAL_BASED_OUTPATIENT_CLINIC_OR_DEPARTMENT_OTHER): Payer: Medicare Other | Admitting: Adult Health

## 2012-12-29 ENCOUNTER — Encounter: Payer: Self-pay | Admitting: Adult Health

## 2012-12-29 VITALS — BP 136/72 | HR 97 | Temp 98.1°F | Resp 18 | Ht 68.0 in | Wt 171.3 lb

## 2012-12-29 DIAGNOSIS — R5383 Other fatigue: Secondary | ICD-10-CM

## 2012-12-29 DIAGNOSIS — D7589 Other specified diseases of blood and blood-forming organs: Secondary | ICD-10-CM

## 2012-12-29 DIAGNOSIS — D75839 Thrombocytosis, unspecified: Secondary | ICD-10-CM

## 2012-12-29 DIAGNOSIS — D473 Essential (hemorrhagic) thrombocythemia: Secondary | ICD-10-CM

## 2012-12-29 DIAGNOSIS — D539 Nutritional anemia, unspecified: Secondary | ICD-10-CM

## 2012-12-29 DIAGNOSIS — R5381 Other malaise: Secondary | ICD-10-CM

## 2012-12-29 LAB — CBC WITH DIFFERENTIAL/PLATELET
EOS%: 10.2 % — ABNORMAL HIGH (ref 0.0–7.0)
Eosinophils Absolute: 0.7 10*3/uL — ABNORMAL HIGH (ref 0.0–0.5)
LYMPH%: 12.6 % — ABNORMAL LOW (ref 14.0–49.7)
MCH: 34.9 pg — ABNORMAL HIGH (ref 25.1–34.0)
MCV: 104.3 fL — ABNORMAL HIGH (ref 79.5–101.0)
MONO#: 0.5 10*3/uL (ref 0.1–0.9)
MONO%: 6.9 % (ref 0.0–14.0)
NEUT%: 68.9 % (ref 38.4–76.8)
Platelets: 334 10*3/uL (ref 145–400)
RBC: 3.29 10*6/uL — ABNORMAL LOW (ref 3.70–5.45)
RDW: 13.1 % (ref 11.2–14.5)
WBC: 7.3 10*3/uL (ref 3.9–10.3)

## 2012-12-29 NOTE — Patient Instructions (Signed)
Doing well.  Continue Hydrea.  We will re-check your labs next month and let you know if there is any further change with the hemoglobin.

## 2012-12-29 NOTE — Progress Notes (Signed)
CC: Deanna Hill. Little, M.D.  [name], M.D.   DIAGNOSIS:  72 year old female with essential thrombocytosis with a JAK2 mutation.  CURRENT THERAPY: Hydrea 500 mg daily with  Full-dose aspirin.  INTERVAL HISTORY:  Deanna Hill is doing well today.  She's been taking her Hydrea daily and has been tolerating it well.  She remains fatigued and is very tearful when discussing her husbands passing 2 years ago.  She has gone to a couple of counseling sessions and spends time with her daughter once a week.  Her hemoglobin is very slightly decreased and she's concerned.  Otherwise, a 10 piont ROS is neg.   REVIEW OF SYSTEMS:  A 10 point review of systems was conducted and is otherwise negative except for what is noted above.    MEDICATIONS:  Current Outpatient Prescriptions  Medication Sig Dispense Refill  . ALPRAZolam (XANAX) 0.25 MG tablet Take 0.25 mg by mouth at bedtime as needed.        [name] aspirin 325 MG tablet Take 325 mg by mouth daily.        . Calcium Carbonate-Vitamin D (CALCIUM-VITAMIN D) 500-200 MG-UNIT per tablet Take 1 tablet by mouth 2 (two) times daily with a meal.        . clidinium-chlordiazePOXIDE (LIBRAX) 2.5-5 MG per capsule Take 1 capsule by mouth 3 (three) times daily as needed.        . Evening Primrose topical oil Apply topically as needed.        . hydroxyurea (HYDREA) 500 MG capsule Take 1 capsule (500 mg total) by mouth daily. May take with food to minimize GI side effects.  30 capsule  12  . ibuprofen (ADVIL,MOTRIN) 200 MG tablet Take 200 mg by mouth every 6 (six) hours as needed.        [name] levothyroxine (SYNTHROID, LEVOTHROID) 100 MCG tablet Take 100 mcg by mouth daily.        . Multiple Vitamin (MULTIVITAMIN) tablet Take 1 tablet by mouth daily.        . ranitidine (ZANTAC) 150 MG capsule Take 150 mg by mouth 2 (two) times daily.        . Vitamin D, Ergocalciferol, (DRISDOL) 50000 UNITS CAPS Take 50,000 Units by mouth.         No current facility-administered medications  for this visit.     PHYSICAL EXAMINATION:    Blood pressure 136/72, pulse 97, temperature 98.1 F (36.7 C), temperature source Oral, resp. rate 18, height 5\' 8"  (1.727 m), weight 171 lb 4.8 oz (77.701 kg). General: Patient is a well appearing female in no acute distress HEENT: PERRLA, sclerae anicteric no conjunctival pallor, MMM Neck: supple, no palpable adenopathy Lungs: clear to auscultation bilaterally, no wheezes, rhonchi, or rales Cardiovascular: regular rate rhythm, S1, S2, no murmurs, rubs or gallops Abdomen: Soft, non-tender, non-distended, normoactive bowel sounds, no HSM Extremities: warm and well perfused, no clubbing, cyanosis, or edema Skin: No rashes or lesions Neuro: Non-focal    LABORATORY DATA:  Lab Results  Component Value Date   WBC 7.3 12/29/2012   HGB 11.5* 12/29/2012   HCT 34.3* 12/29/2012   MCV 104.3* 12/29/2012   PLT 334 12/29/2012    IMPRESSION AND PLAN:  72 year old female with:  1.  Essential thrombocytosis with a JAK2 mutation.Patient is now on Hydrea 500 mg daily. She is tolerating it quite well. Her hemoglobin is slightly low today.  She also has macrocytosis, we will evaluate for vitamin b or folate deficiency and monitor  her labs every month x 2 months, then every other month.  She will f/u with [place] in 6 months.   She will call us in the interim for any questions or concerns.  Her fatigue is likely related to her grief over the loss of her husband.  I recommended she get involved with a social group from her church or community.    I spent 25 minutes counseling the patient face to face.  The total time spent in the appointment was 30 minutes.   Illa Level, NP Medical Oncology [facility] [phone] 12/29/2012, 10:28 AM

## 2013-01-06 LAB — VITAMIN B12 DEFICIENCY PANEL - CHCC

## 2013-01-10 ENCOUNTER — Telehealth: Payer: Self-pay | Admitting: Emergency Medicine

## 2013-01-10 NOTE — Telephone Encounter (Signed)
Dr Welton Flakes reviewed lab results from 12/29/12. Reported to patient that everything is within normal limits. Patient instructed to continue current dose of Hydrea and to call for any further questions or concerns. Patient verbalized understanding. Will fax results to her PCP.

## 2013-01-13 ENCOUNTER — Other Ambulatory Visit: Payer: Self-pay | Admitting: Family Medicine

## 2013-01-13 ENCOUNTER — Ambulatory Visit
Admission: RE | Admit: 2013-01-13 | Discharge: 2013-01-13 | Disposition: A | Discharge status: Home / Self Care | Payer: Medicare Other | Source: Ambulatory Visit | Attending: Family Medicine | Admitting: Family Medicine

## 2013-01-13 DIAGNOSIS — W19XXXA Unspecified fall, initial encounter: Secondary | ICD-10-CM

## 2013-01-13 DIAGNOSIS — M533 Sacrococcygeal disorders, not elsewhere classified: Secondary | ICD-10-CM

## 2013-02-02 ENCOUNTER — Other Ambulatory Visit (HOSPITAL_BASED_OUTPATIENT_CLINIC_OR_DEPARTMENT_OTHER): Payer: Medicare Other | Admitting: Lab

## 2013-02-02 DIAGNOSIS — D75839 Thrombocytosis, unspecified: Secondary | ICD-10-CM

## 2013-02-02 DIAGNOSIS — D473 Essential (hemorrhagic) thrombocythemia: Secondary | ICD-10-CM

## 2013-02-02 LAB — CBC WITH DIFFERENTIAL/PLATELET
Basophils Absolute: 0.1 10*3/uL (ref 0.0–0.1)
EOS%: 10.4 % — ABNORMAL HIGH (ref 0.0–7.0)
Eosinophils Absolute: 0.7 10*3/uL — ABNORMAL HIGH (ref 0.0–0.5)
HCT: 34.8 % (ref 34.8–46.6)
HGB: 11.4 g/dL — ABNORMAL LOW (ref 11.6–15.9)
LYMPH%: 13.3 % — ABNORMAL LOW (ref 14.0–49.7)
MCH: 33.9 pg (ref 25.1–34.0)
MCV: 103.9 fL — ABNORMAL HIGH (ref 79.5–101.0)
MONO#: 0.6 10*3/uL (ref 0.1–0.9)
MONO%: 8.8 % (ref 0.0–14.0)
NEUT#: 4.2 10*3/uL (ref 1.5–6.5)
Platelets: 316 10*3/uL (ref 145–400)
RBC: 3.35 10*6/uL — ABNORMAL LOW (ref 3.70–5.45)
RDW: 13.1 % (ref 11.2–14.5)

## 2013-02-27 ENCOUNTER — Other Ambulatory Visit: Payer: Self-pay | Admitting: Gastroenterology

## 2013-02-27 DIAGNOSIS — R109 Unspecified abdominal pain: Secondary | ICD-10-CM

## 2013-02-28 ENCOUNTER — Ambulatory Visit
Admission: RE | Admit: 2013-02-28 | Discharge: 2013-02-28 | Disposition: A | Discharge status: Home / Self Care | Payer: Medicare Other | Source: Ambulatory Visit | Attending: Gastroenterology | Admitting: Gastroenterology

## 2013-02-28 DIAGNOSIS — R109 Unspecified abdominal pain: Secondary | ICD-10-CM

## 2013-03-02 ENCOUNTER — Other Ambulatory Visit (HOSPITAL_BASED_OUTPATIENT_CLINIC_OR_DEPARTMENT_OTHER): Payer: Medicare Other

## 2013-03-02 DIAGNOSIS — D473 Essential (hemorrhagic) thrombocythemia: Secondary | ICD-10-CM

## 2013-03-02 DIAGNOSIS — D75839 Thrombocytosis, unspecified: Secondary | ICD-10-CM

## 2013-03-02 LAB — CBC WITH DIFFERENTIAL/PLATELET
Eosinophils Absolute: 0.6 10*3/uL — ABNORMAL HIGH (ref 0.0–0.5)
HCT: 35 % (ref 34.8–46.6)
HGB: 11.6 g/dL (ref 11.6–15.9)
LYMPH%: 12.9 % — ABNORMAL LOW (ref 14.0–49.7)
MCH: 34.6 pg — ABNORMAL HIGH (ref 25.1–34.0)
MCV: 104.1 fL — ABNORMAL HIGH (ref 79.5–101.0)
MONO#: 0.5 10*3/uL (ref 0.1–0.9)
MONO%: 8.6 % (ref 0.0–14.0)
NEUT#: 4.2 10*3/uL (ref 1.5–6.5)
Platelets: 286 10*3/uL (ref 145–400)
RBC: 3.36 10*6/uL — ABNORMAL LOW (ref 3.70–5.45)
WBC: 6.3 10*3/uL (ref 3.9–10.3)

## 2013-03-02 NOTE — Progress Notes (Signed)
Quick Note:  Please call patient:stay with same dose of hydrea ______

## 2013-03-07 ENCOUNTER — Telehealth: Payer: Self-pay | Admitting: *Deleted

## 2013-03-07 NOTE — Telephone Encounter (Signed)
Message copied by Cooper Render on Tue Mar 07, 2013  3:58 PM ------      Message from: Victorino December      Created: Thu Mar 02, 2013 11:24 PM       Please call patient:stay with same dose of hydrea ------

## 2013-03-07 NOTE — Telephone Encounter (Signed)
Per NP, notified pt to continue same dose of Hydrea. Pt confirmed she is currently taking Hydrea 500mg  daily. Pt verbalized understanding. No further concerns

## 2013-03-16 HISTORY — PX: CATARACT EXTRACTION: SUR2

## 2013-05-10 ENCOUNTER — Telehealth: Payer: Self-pay | Admitting: Oncology

## 2013-05-10 NOTE — Telephone Encounter (Signed)
m °

## 2013-05-11 ENCOUNTER — Other Ambulatory Visit: Payer: Medicare Other

## 2013-05-14 DIAGNOSIS — I2510 Atherosclerotic heart disease of native coronary artery without angina pectoris: Secondary | ICD-10-CM

## 2013-05-14 HISTORY — DX: Atherosclerotic heart disease of native coronary artery without angina pectoris: I25.10

## 2013-05-16 ENCOUNTER — Other Ambulatory Visit (HOSPITAL_BASED_OUTPATIENT_CLINIC_OR_DEPARTMENT_OTHER): Payer: Medicare Other

## 2013-05-16 DIAGNOSIS — D7589 Other specified diseases of blood and blood-forming organs: Secondary | ICD-10-CM

## 2013-05-16 DIAGNOSIS — D75839 Thrombocytosis, unspecified: Secondary | ICD-10-CM

## 2013-05-16 DIAGNOSIS — D473 Essential (hemorrhagic) thrombocythemia: Secondary | ICD-10-CM

## 2013-05-16 LAB — CBC WITH DIFFERENTIAL/PLATELET
BASO%: 1.5 % (ref 0.0–2.0)
Basophils Absolute: 0.1 10*3/uL (ref 0.0–0.1)
EOS%: 13.2 % — AB (ref 0.0–7.0)
Eosinophils Absolute: 1 10*3/uL — ABNORMAL HIGH (ref 0.0–0.5)
HCT: 35.7 % (ref 34.8–46.6)
HEMOGLOBIN: 11.7 g/dL (ref 11.6–15.9)
LYMPH#: 1.1 10*3/uL (ref 0.9–3.3)
LYMPH%: 14.6 % (ref 14.0–49.7)
MCH: 33.5 pg (ref 25.1–34.0)
MCHC: 32.6 g/dL (ref 31.5–36.0)
MCV: 102.7 fL — AB (ref 79.5–101.0)
MONO#: 0.6 10*3/uL (ref 0.1–0.9)
MONO%: 8.2 % (ref 0.0–14.0)
NEUT%: 62.5 % (ref 38.4–76.8)
NEUTROS ABS: 4.5 10*3/uL (ref 1.5–6.5)
Platelets: 346 10*3/uL (ref 145–400)
RBC: 3.48 10*6/uL — AB (ref 3.70–5.45)
RDW: 13.4 % (ref 11.2–14.5)
WBC: 7.3 10*3/uL (ref 3.9–10.3)

## 2013-05-22 ENCOUNTER — Telehealth: Payer: Self-pay | Admitting: *Deleted

## 2013-05-22 NOTE — Telephone Encounter (Signed)
Pt notified per Dr Humphrey Rolls to continue hydrea at present dose

## 2013-05-26 ENCOUNTER — Encounter (HOSPITAL_COMMUNITY): Payer: Self-pay | Admitting: Pharmacy Technician

## 2013-06-05 ENCOUNTER — Other Ambulatory Visit: Payer: Self-pay | Admitting: Cardiology

## 2013-06-05 NOTE — H&P (Addendum)
Maddisen, Vought  Date of visit:  06/05/2013 DOB:  02-29-1941    Age:  73 yrs. Medical record number:  [id number]     Account number:  [id number] Primary Care Provider: Hulan Fess ____________________________ CURRENT DIAGNOSES  1. Chest Pain  2. Pre-Op Cardiovascular Exam  3. Hypercholesterolemia  4. Hypothyroidism  5. GERD  6. Anemia,other specified  7. Essential Thrombocytosis ____________________________ ALLERGIES  Iodine, Anaphylaxis  Zoloft, Intolerance-dizziness ____________________________ MEDICATIONS  1. Hardin Negus' Colon Health 1.5 billion cell capsule, 1 p.o. daily  2. EpiPen 0.3 mg/0.3 mL (1:1,000) injection,auto-injector, PRN  3. aspirin 325 mg tablet, 1 p.o. daily  4. levothyroxine 100 mcg tablet, 1 p.o. daily  5. hydroxyurea 500 mg capsule, 1 p.o. daily  6. alprazolam 0.25 mg tablet, 1/2 tab prn  7. Claritin 10 mg tablet, 1 p.o. daily  8. Vitamin D3 2,000 unit tablet, 1 p.o. daily  9. multivitamin capsule, BID  10. Zantac 150 mg tablet, BID  11. nitroglycerin 0.4 mg sublingual tablet, Take as directed ____________________________ CHIEF COMPLAINTS  Pre cath ____________________________ HISTORY OF PRESENT ILLNESS Patient seen for preoperative cardiac evaluation. She has a history of probable angina particularly with activity and had a nuclear scan last week done with Lexiscan and exercise. She was noted to have very poor exercise capacity and had clinical angina and only walked 4 minutes. She did not achieve her heart rate and was given Lexiscan and had significant angina with significant ST depression inferolateral leads thatt took a long time to resolve. She had evidence of anterolateral ischemia also. She wanted to go on a trip with her family and has waited a couple of weeks but has continued to have substernal chest discomfort with exertion such as walking. She tried to take atenolol but it made her feel bad and she quit taking this.  She continues to have  exertional symptoms. She denies PND, orthopnea or claudication. She evidently has a severe anaphylactic allergy to shellfish but had intravenous contrast with a CT scan in 2011 without problems.   ____________________________ PAST HISTORY  Past Medical Illnesses:  GERD, anemia, hypothyroidism, irritable bowel syndrome, thrombocytosis, hyperlipidemia, anxiety, asthma;  Cardiovascular Illnesses:  palpitations;  Surgical Procedures:  tonsillectomy, cataract extraction OU, cholecystectomy (lap), hysterectomy, toe surg left;  Cardiology Procedures-Invasive:  no history of prior cardiac procedures;  Cardiology Procedures-Noninvasive:  event monitor, treadmill cardiolite, lexiscan cardiolite March 2015;  LVEF of 67% documented via nuclear study on 05/18/2013,   ____________________________ CARDIO-PULMONARY TEST DATES EKG Date:  05/01/2013;  Nuclear Study Date:  05/18/2013;   ____________________________ FAMILY HISTORY Brother -- Brother alive with problem, Prostate cancer, Hypertension Brother -- Brother alive with problem, Prostate cancer Father -- Father dead, Myocardial infarction Mother -- Mother dead, Cancer ____________________________ SOCIAL HISTORY Alcohol Use:  no alcohol use;  Smoking:  never smoked;  Diet:  regular diet;  Lifestyle:  widowed;  Exercise:  no regular exercise;  Occupation:  retired, Scientist, research (life sciences);  Residence:  lives alone;   ____________________________ REVIEW OF SYSTEMS General:  malaise and fatigue, no change in weight  Integumentary:rosacea Eyes: cataract extraction bilaterally Ears, Nose, Throat, Mouth:  denies any hearing loss, epistaxis, hoarseness or difficulty speaking. Respiratory: denies dyspnea, cough, wheezing or hemoptysis. Cardiovascular:  please review HPI Abdominal: denies dyspepsia, GI bleeding, constipation, or diarrheaGenitourinary-Female: no dysuria, urgency, frequency, UTIs, or stress incontinence Musculoskeletal:  fibromyalgia Neurological:  denies  headaches, stroke, or TIA Psychiatric:  depression  ____________________________ PHYSICAL EXAMINATION VITAL SIGNS  Blood Pressure:  148/70 Sitting, Right arm, regular  cuff  , 140/72 Standing, Right arm and regular cuff   Pulse:  88/min. Weight:  168.00 lbs. Height:  69"BMI: 25  Constitutional:  pleasant white female, in no acute distress, appears depressed Skin:  warm and dry to touch, no apparent skin lesions, or masses noted. Head:  normocephalic, normal hair pattern, no masses or tenderness Eyes:  EOMS Intact, PERRLA, C and S clear, Funduscopic exam not done. ENT:  ears, nose and throat reveal no gross abnormalities.  Dentition good. Neck:  supple, without massess. No JVD, thyromegaly or carotid bruits. Carotid upstroke normal. Chest:  normal symmetry, clear to auscultation. Cardiac:  regular rhythm, normal S1 and S2, No S3 or S4, no murmurs, gallops or rubs detected. Abdomen:  abdomen soft,non-tender, no masses, no hepatospenomegaly, or aneurysm noted Peripheral Pulses:  the femoral,dorsalis pedis, and posterior tibial pulses are full and equal bilaterally with no bruits auscultated. Extremities & Back:  no deformities, clubbing, cyanosis, erythema or edema observed. Normal muscle strength and tone. Neurological:  no gross motor or sensory deficits noted, affect appropriate, oriented x3. ____________________________ MOST RECENT LIPID PANEL 04/26/13  CHOL TOTL 188 mg/dl, LDL 120 NM, HDL 39 mg/dl, TRIGLYCER 143 mg/dl, ALT 13 u/l, ALK PHOS 52 u/l, CHOL/HDL 4.8 (Calc) and AST 17 u/l ____________________________ IMPRESSIONS/PLAN  1. Angina pectoris that is progressive with an abnormal Myoview test 2. Hyperlipidemia 3. Hypertension 4. Situational stress and anxiety  Recommendations:  Cardiac catheterization was discussed with the patient including risks of myocardial infarction, death, stroke, bleeding, arrhythmia, dye allergy, or renal insufficiency. He understands and is willing to  proceed. Possibility of percutaneous intervention at the same setting was also discussed with the patient including risks. ____________________________ TODAYS ORDERS  1. Comprehensive Metabolic Panel: Today  2. Complete Blood Count: Today  3. Draw PT/INR: Today  4. PTT: Today                       ____________________________ Cardiology Physician:  Kerry Hough MD [facility]

## 2013-06-08 ENCOUNTER — Ambulatory Visit (HOSPITAL_COMMUNITY)
Admission: RE | Admit: 2013-06-08 | Discharge: 2013-06-08 | Disposition: A | Discharge status: Home / Self Care | Payer: Medicare Other | Source: Ambulatory Visit | Attending: Cardiology | Admitting: Cardiology

## 2013-06-08 ENCOUNTER — Encounter (HOSPITAL_COMMUNITY): Payer: Self-pay | Admitting: Cardiology

## 2013-06-08 ENCOUNTER — Encounter (HOSPITAL_COMMUNITY)
Admission: RE | Disposition: A | Discharge status: Home / Self Care | Payer: Self-pay | Source: Ambulatory Visit | Attending: Cardiology

## 2013-06-08 DIAGNOSIS — E785 Hyperlipidemia, unspecified: Secondary | ICD-10-CM | POA: Diagnosis present

## 2013-06-08 DIAGNOSIS — K219 Gastro-esophageal reflux disease without esophagitis: Secondary | ICD-10-CM | POA: Insufficient documentation

## 2013-06-08 DIAGNOSIS — R9439 Abnormal result of other cardiovascular function study: Secondary | ICD-10-CM | POA: Diagnosis present

## 2013-06-08 DIAGNOSIS — I2510 Atherosclerotic heart disease of native coronary artery without angina pectoris: Secondary | ICD-10-CM | POA: Insufficient documentation

## 2013-06-08 DIAGNOSIS — F430 Acute stress reaction: Secondary | ICD-10-CM | POA: Insufficient documentation

## 2013-06-08 DIAGNOSIS — E039 Hypothyroidism, unspecified: Secondary | ICD-10-CM | POA: Diagnosis present

## 2013-06-08 DIAGNOSIS — D473 Essential (hemorrhagic) thrombocythemia: Secondary | ICD-10-CM | POA: Insufficient documentation

## 2013-06-08 DIAGNOSIS — I209 Angina pectoris, unspecified: Secondary | ICD-10-CM | POA: Diagnosis present

## 2013-06-08 DIAGNOSIS — E7800 Pure hypercholesterolemia, unspecified: Secondary | ICD-10-CM | POA: Insufficient documentation

## 2013-06-08 DIAGNOSIS — Z7982 Long term (current) use of aspirin: Secondary | ICD-10-CM | POA: Insufficient documentation

## 2013-06-08 DIAGNOSIS — F411 Generalized anxiety disorder: Secondary | ICD-10-CM | POA: Insufficient documentation

## 2013-06-08 DIAGNOSIS — D6489 Other specified anemias: Secondary | ICD-10-CM | POA: Insufficient documentation

## 2013-06-08 DIAGNOSIS — J45909 Unspecified asthma, uncomplicated: Secondary | ICD-10-CM | POA: Insufficient documentation

## 2013-06-08 DIAGNOSIS — K589 Irritable bowel syndrome without diarrhea: Secondary | ICD-10-CM | POA: Insufficient documentation

## 2013-06-08 HISTORY — DX: Anxiety disorder, unspecified: F41.9

## 2013-06-08 HISTORY — DX: Hypothyroidism, unspecified: E03.9

## 2013-06-08 HISTORY — DX: Hyperlipidemia, unspecified: E78.5

## 2013-06-08 HISTORY — PX: LEFT HEART CATHETERIZATION WITH CORONARY ANGIOGRAM: SHX5451

## 2013-06-08 HISTORY — DX: Irritable bowel syndrome, unspecified: K58.9

## 2013-06-08 HISTORY — DX: Gastro-esophageal reflux disease without esophagitis: K21.9

## 2013-06-08 SURGERY — LEFT HEART CATHETERIZATION WITH CORONARY ANGIOGRAM
Anesthesia: LOCAL

## 2013-06-08 MED ORDER — MIDAZOLAM HCL 2 MG/2ML IJ SOLN
INTRAMUSCULAR | Status: AC
  Filled 2013-06-08: qty 2

## 2013-06-08 MED ORDER — NITROGLYCERIN 0.2 MG/ML ON CALL CATH LAB
INTRAVENOUS | Status: AC
  Filled 2013-06-08: qty 1

## 2013-06-08 MED ORDER — DIAZEPAM 5 MG PO TABS
5.0000 mg | ORAL_TABLET | ORAL | Status: AC
  Administered 2013-06-08: 5 mg via ORAL
  Filled 2013-06-08: qty 1

## 2013-06-08 MED ORDER — LIDOCAINE HCL (PF) 1 % IJ SOLN
INTRAMUSCULAR | Status: AC
  Filled 2013-06-08: qty 30

## 2013-06-08 MED ORDER — SODIUM CHLORIDE 0.9 % IV SOLN
1.0000 mL/kg/h | INTRAVENOUS | Status: DC
Start: 2013-06-08 — End: 2013-06-08

## 2013-06-08 MED ORDER — FAMOTIDINE IN NACL 20-0.9 MG/50ML-% IV SOLN
20.0000 mg | INTRAVENOUS | Status: AC
  Administered 2013-06-08: 20 mg via INTRAVENOUS
  Filled 2013-06-08: qty 50

## 2013-06-08 MED ORDER — LABETALOL HCL 5 MG/ML IV SOLN
INTRAVENOUS | Status: AC
  Filled 2013-06-08: qty 4

## 2013-06-08 MED ORDER — ASPIRIN 81 MG PO CHEW
81.0000 mg | CHEWABLE_TABLET | ORAL | Status: AC
  Administered 2013-06-08: 81 mg via ORAL
  Filled 2013-06-08: qty 1

## 2013-06-08 MED ORDER — HEPARIN (PORCINE) IN NACL 2-0.9 UNIT/ML-% IJ SOLN
INTRAMUSCULAR | Status: AC
  Filled 2013-06-08: qty 1000

## 2013-06-08 MED ORDER — SODIUM CHLORIDE 0.9 % IV SOLN
INTRAVENOUS | Status: DC
  Administered 2013-06-08: 08:00:00 via INTRAVENOUS

## 2013-06-08 MED ORDER — DIPHENHYDRAMINE HCL 50 MG/ML IJ SOLN
25.0000 mg | INTRAMUSCULAR | Status: AC
  Administered 2013-06-08: 25 mg via INTRAVENOUS
  Filled 2013-06-08: qty 1

## 2013-06-08 NOTE — Interval H&P Note (Signed)
History and Physical Interval Note:  06/08/2013 8:56 AM    Patient seen and examined.  No interval change in history and exam since last note.  Stable for procedure.  Kerry Hough. MD [facility]  06/08/2013

## 2013-06-08 NOTE — CV Procedure (Signed)
Cardiac Catheterization Report   Deanna Hill    73 y.o.  female  DOB: 09-17-1940  MRN: [id number]  06/08/2013    PROCEDURE:  Left heart catheterization with selective coronary angiography, left ventriculogram.  INDICATIONS:  Clinical angina with an abnormal nuclear stress test and risk factors of heart disease in the family as well as hyperlipidemia   The risks, benefits, and details of the procedure were explained to the patient.  The patient verbalized understanding and wanted to proceed.  Informed written consent was obtained.  PROCEDURE TECHNIQUE:  Versed 1 mg intravenously was used for sedation. After Xylocaine anesthesia a 34F sheath was placed in the right femoral artery with a single anterior needle wall stick.   Left coronary angiography was done using a Judkins L4 guide catheter.  Right coronary angiography was done using a Judkins R4 guide catheter.  A 30 cc ventriculogram was performed in the 30 degree RAO projection. At the end of the procedure she was given 20 mg of labetalol because of elevation of systolic blood pressure. Tolerated the procedure well.  Sheath removed in the holding area.   CONTRAST:  Total of 75 cc.  COMPLICATIONS:  None.    HEMODYNAMICS: Aortic postcontrast 175/80 LV postcontrast 175/5-11  There was no gradient between the left ventricle and aorta.    ANGIOGRAPHIC DATA:    CORONARY ARTERIES:   Arise and distribute normally.  Right dominant. No coronary calcification is noted.  Left main coronary artery: Normal.  Left anterior descending: Scattered irregularities but no severe obstructive stenoses noted.  Circumflex coronary artery: 30% proximal stenosis, 2 marginal branches arise with scattered irregularities.  Right coronary artery: Scattered irregularities with no focal obstructive stenoses noted.. Large dominant vessel with a large posterior descending and several posterolateral branches.  LEFT  VENTRICULOGRAM:  Performed in the 30 RAO projection.  The aortic and mitral valves are normal. The left ventricle is normal in size with normal wall motion. Estimated ejection fraction is 60%..   IMPRESSIONS:  1. Mild coronary artery disease to 30% proximal circumflex coronary stenosis but no significant obstructive disease elsewhere 2. Normal left ventricle.[name]  RECOMMENDATION:  Intensive medical therapy. The patient does have clinical angina with abnormal exercise tolerance and an abnormal EKG response to exercise. We will treat her intensively medically.Kerry Hough MD [facility]

## 2013-06-08 NOTE — Discharge Instructions (Signed)
Angiography, Care After °Refer to this sheet in the next few weeks. These instructions provide you with information on caring for yourself after your procedure. Your health care provider may also give you more specific instructions. Your treatment has been planned according to current medical practices, but problems sometimes occur. Call your health care provider if you have any problems or questions after your procedure.  °WHAT TO EXPECT AFTER THE PROCEDURE °After your procedure, it is typical to have the following sensations: °· Minor discomfort or tenderness and a small bump at the catheter insertion site. The bump should usually decrease in size and tenderness within 1 to 2 weeks. °· Any bruising will usually fade within 2 to 4 weeks. °HOME CARE INSTRUCTIONS  °· You may need to keep taking blood thinners if they were prescribed for you. Only take over-the-counter or prescription medicines for pain, fever, or discomfort as directed by your health care provider. °· Do not apply powder or lotion to the site. °· Do not sit in a bathtub, swimming pool, or whirlpool for 5 to 7 days. °· You may shower 24 hours after the procedure. Remove the bandage (dressing) and gently wash the site with plain soap and water. Gently pat the site dry. °· Inspect the site at least twice daily. °· Limit your activity for the first 48 hours. Do not bend, squat, or lift anything over 20 lb (9 kg) or as directed by your health care provider. °· Do not drive home if you are discharged the day of the procedure. Have someone else drive you. Follow instructions about when you can drive or return to work. °SEEK MEDICAL CARE IF: °· You get lightheaded when standing up. °· You have drainage (other than a small amount of blood on the dressing). °· You have chills. °· You have a fever. °· You have redness, warmth, swelling, or pain at the insertion site. °SEEK IMMEDIATE MEDICAL CARE IF:  °· You develop chest pain or shortness of breath, feel faint,  or pass out. °· You have bleeding, swelling larger than a walnut, or drainage from the catheter insertion site. °· You develop pain, discoloration, coldness, or severe bruising in the leg or arm that held the catheter. °· You develop bleeding from any other place, such as the bowels. You may see bright red blood in your urine or stools, or your stools may appear black and tarry. °· You have heavy bleeding from the site. If this happens, hold pressure on the site. °MAKE SURE YOU: °· Understand these instructions. °· Will watch your condition. °· Will get help right away if you are not doing well or get worse. °Document Released: 09/18/2004 Document Revised: 11/02/2012 Document Reviewed: 07/25/2012 °Exit[employer]® Patient Information ©2014 [employer]. ° °

## 2013-07-03 ENCOUNTER — Other Ambulatory Visit: Payer: Self-pay | Admitting: Oncology

## 2013-07-03 ENCOUNTER — Telehealth: Payer: Self-pay | Admitting: Oncology

## 2013-07-03 NOTE — Telephone Encounter (Signed)
MOVED 4/23 APPT W/LC TO 5/1 W/PK TO WORK IN TX PT DUE TO KK OUT. S/W PT RE CHANGE RE NEW D/T FOR LB/PK 5/1.

## 2013-07-06 ENCOUNTER — Other Ambulatory Visit: Payer: Medicare Other

## 2013-07-06 ENCOUNTER — Ambulatory Visit: Payer: Medicare Other | Admitting: Adult Health

## 2013-07-14 ENCOUNTER — Ambulatory Visit (HOSPITAL_BASED_OUTPATIENT_CLINIC_OR_DEPARTMENT_OTHER): Payer: Medicare Other | Admitting: Hematology and Oncology

## 2013-07-14 ENCOUNTER — Other Ambulatory Visit (HOSPITAL_BASED_OUTPATIENT_CLINIC_OR_DEPARTMENT_OTHER): Payer: Medicare Other

## 2013-07-14 ENCOUNTER — Telehealth: Payer: Self-pay | Admitting: Hematology and Oncology

## 2013-07-14 ENCOUNTER — Telehealth: Payer: Self-pay | Admitting: *Deleted

## 2013-07-14 ENCOUNTER — Ambulatory Visit (HOSPITAL_BASED_OUTPATIENT_CLINIC_OR_DEPARTMENT_OTHER): Payer: Medicare Other

## 2013-07-14 ENCOUNTER — Encounter (HOSPITAL_COMMUNITY): Payer: Medicare Other

## 2013-07-14 VITALS — BP 137/71 | HR 94 | Temp 98.6°F | Resp 18 | Ht 68.0 in | Wt 166.9 lb

## 2013-07-14 DIAGNOSIS — D473 Essential (hemorrhagic) thrombocythemia: Secondary | ICD-10-CM

## 2013-07-14 DIAGNOSIS — D649 Anemia, unspecified: Secondary | ICD-10-CM

## 2013-07-14 DIAGNOSIS — T451X5A Adverse effect of antineoplastic and immunosuppressive drugs, initial encounter: Secondary | ICD-10-CM

## 2013-07-14 DIAGNOSIS — D7589 Other specified diseases of blood and blood-forming organs: Secondary | ICD-10-CM

## 2013-07-14 DIAGNOSIS — D6481 Anemia due to antineoplastic chemotherapy: Secondary | ICD-10-CM | POA: Insufficient documentation

## 2013-07-14 LAB — COMPREHENSIVE METABOLIC PANEL (CC13)
ALBUMIN: 4.2 g/dL (ref 3.5–5.0)
ALT: 9 U/L (ref 0–55)
AST: 16 U/L (ref 5–34)
Alkaline Phosphatase: 49 U/L (ref 40–150)
Anion Gap: 8 mEq/L (ref 3–11)
BUN: 23 mg/dL (ref 7.0–26.0)
CO2: 26 mEq/L (ref 22–29)
Calcium: 9.7 mg/dL (ref 8.4–10.4)
Chloride: 107 mEq/L (ref 98–109)
Creatinine: 1 mg/dL (ref 0.6–1.1)
GLUCOSE: 80 mg/dL (ref 70–140)
POTASSIUM: 4.2 meq/L (ref 3.5–5.1)
SODIUM: 141 meq/L (ref 136–145)
TOTAL PROTEIN: 7.2 g/dL (ref 6.4–8.3)
Total Bilirubin: 0.28 mg/dL (ref 0.20–1.20)

## 2013-07-14 LAB — CBC & DIFF AND RETIC
BASO%: 1.3 % (ref 0.0–2.0)
Basophils Absolute: 0.1 10*3/uL (ref 0.0–0.1)
EOS%: 14.7 % — AB (ref 0.0–7.0)
Eosinophils Absolute: 1 10*3/uL — ABNORMAL HIGH (ref 0.0–0.5)
HCT: 35.2 % (ref 34.8–46.6)
HEMOGLOBIN: 11.2 g/dL — AB (ref 11.6–15.9)
Immature Retic Fract: 2.2 % (ref 1.60–10.00)
LYMPH%: 15.1 % (ref 14.0–49.7)
MCH: 33.1 pg (ref 25.1–34.0)
MCHC: 31.8 g/dL (ref 31.5–36.0)
MCV: 104.1 fL — AB (ref 79.5–101.0)
MONO#: 0.6 10*3/uL (ref 0.1–0.9)
MONO%: 8.1 % (ref 0.0–14.0)
NEUT%: 60.8 % (ref 38.4–76.8)
NEUTROS ABS: 4.3 10*3/uL (ref 1.5–6.5)
Platelets: 317 10*3/uL (ref 145–400)
RBC: 3.38 10*6/uL — AB (ref 3.70–5.45)
RDW: 13.3 % (ref 11.2–14.5)
RETIC %: 0.91 % (ref 0.70–2.10)
Retic Ct Abs: 30.76 10*3/uL — ABNORMAL LOW (ref 33.70–90.70)
WBC: 7 10*3/uL (ref 3.9–10.3)
lymph#: 1.1 10*3/uL (ref 0.9–3.3)

## 2013-07-14 LAB — VITAMIN B12: VITAMIN B 12: 523 pg/mL (ref 211–911)

## 2013-07-14 NOTE — Telephone Encounter (Signed)
, °

## 2013-07-15 NOTE — Progress Notes (Signed)
CC: Deanna Hill. Little, M.D.  Jeryl Columbia, M.D.   DIAGNOSIS:  73 -year-old female with essential thrombocytosis with a JAK2 mutation.  CURRENT THERAPY: Hydrea 500 mg daily with  Full-dose aspirin.  INTERVAL HISTORY:  Deanna Hill is here for followup visit for her essential thrombocytosis. She says that she had cardiac cath over 3 weeks ago for angina and  was started on the medications for that. At present she complains of fatigue. Denies any chest discomfort,  Constipation, blood in the stool, blood in the urine. She says that she has lost her husband,  mother and son within a short time frame that she sometimes feels very tearful at times depressed. She also complains of left arm swelling and tenderness since that time she got Flu inj.  She's been taking her Hydrea daily and has been tolerating it well.    REVIEW OF SYSTEMS:  A 10 point review of systems was conducted and is otherwise negative except for what is noted above.    MEDICATIONS:  Current Outpatient Prescriptions  Medication Sig Dispense Refill  . acetaminophen (TYLENOL) 325 MG tablet Take 650 mg by mouth daily as needed for mild pain.      [name] aspirin 325 MG tablet Take 325 mg by mouth daily.       . Cholecalciferol (VITAMIN D) 2000 UNITS CAPS Take 1 capsule by mouth daily.      . Evening Primrose Oil 500 MG CAPS Take 1 capsule by mouth 2 (two) times daily.      . hydroxyurea (HYDREA) 500 MG capsule Take 500 mg by mouth daily. May take with food to minimize GI side effects.      [name] levothyroxine (SYNTHROID, LEVOTHROID) 100 MCG tablet Take 100 mcg by mouth daily.       . metroNIDAZOLE (METROCREAM) 0.75 % cream Apply 1 application topically daily as needed (for rosacea).      . Multiple Vitamin (MULTIVITAMIN) tablet Take 1 tablet by mouth daily.       . ranitidine (ZANTAC) 150 MG capsule Take 150 mg by mouth 2 (two) times daily.       [name] ALPRAZolam (XANAX) 0.25 MG tablet Take 0.125 mg by mouth at bedtime as needed for anxiety.        No current facility-administered medications for this visit.     PHYSICAL EXAMINATION:    Blood pressure 137/71, pulse 94, temperature 98.6 F (37 C), temperature source Oral, resp. rate 18, height 5\' 8"  (1.727 m), weight 166 lb 14.4 oz (75.705 kg). General: Patient is a well appearing female in no acute distress HEENT: PERRLA, sclerae anicteric no conjunctival pallor, MMM Neck: supple, no palpable adenopathy Lungs: clear to auscultation bilaterally, no wheezes, rhonchi, or rales Cardiovascular: regular rate rhythm, S1, S2, no murmurs, rubs or gallops Abdomen: Soft, non-tender, non-distended, normoactive bowel sounds, no HSM Extremities: Left upper extremity is slightly tender and swollen .  Skin: No rashes or lesions Neuro: Non-focal    LABORATORY DATA:  Lab Results  Component Value Date   WBC 7.0 07/14/2013   HGB 11.2* 07/14/2013   HCT 35.2 07/14/2013   MCV 104.1* 07/14/2013   PLT 317 07/14/2013    IMPRESSION AND PLAN:  73 year old female with:  1.  Essential thrombocytosis with JAK2 mutation: Continue Hydrea 500 mg daily and also aspirin 325 mg by mouth once daily. She is tolerating it quite well.   2. macrocytosis most likely related to hydroxyurea  Effect: Her folic acid and vitamin B 12  levels are within normal range. Monitor labs once every  2 months .  3. will also arrange for left upper extremity venous Doppler to rule out DVT in view of her left upper extremity pain and swelling.  4. Mild anemia with  macrocytosis could be related to bone marrow suppression of hydroxyurea: If it continue to get worse, after review of anemia workup if needed plan for bone marrow aspiration and biopsy to rule out  underlying myelodysplastic syndrome    She will call us in the interim for any questions or concerns. I spent 20 minutes counseling the patient face to face.  The total time spent in the appointment was 30 minutes.   Wilmon Arms, M.D. Medical oncology /hematology [place] [phone] 07/15/2013, 6:34 PM

## 2013-07-17 ENCOUNTER — Ambulatory Visit (HOSPITAL_COMMUNITY)
Admission: RE | Admit: 2013-07-17 | Discharge: 2013-07-17 | Disposition: A | Discharge status: Home / Self Care | Payer: Medicare Other | Source: Ambulatory Visit | Attending: Hematology and Oncology | Admitting: Hematology and Oncology

## 2013-07-17 ENCOUNTER — Telehealth: Payer: Self-pay | Admitting: *Deleted

## 2013-07-17 DIAGNOSIS — M79609 Pain in unspecified limb: Secondary | ICD-10-CM

## 2013-07-17 DIAGNOSIS — D473 Essential (hemorrhagic) thrombocythemia: Secondary | ICD-10-CM

## 2013-07-17 LAB — IRON AND TIBC CHCC
%SAT: 24 % (ref 21–57)
IRON: 86 ug/dL (ref 41–142)
TIBC: 358 ug/dL (ref 236–444)
UIBC: 271 ug/dL (ref 120–384)

## 2013-07-17 LAB — FERRITIN CHCC: Ferritin: 11 ng/ml (ref 9–269)

## 2013-07-17 NOTE — Progress Notes (Signed)
VASCULAR LAB PRELIMINARY  PRELIMINARY  PRELIMINARY  PRELIMINARY  Left lower extremity venous duplex completed.    Preliminary report:  Left:  No evidence of DVT or superficial thrombosis.    Nani Ravens, RVT 07/17/2013, 2:12 PM

## 2013-07-17 NOTE — Telephone Encounter (Signed)
Received ph message from Helene/Vasc Lab at [facility] stating that pt's left upper extremity vasc study was negative. This report will be given to Dr Earnest Conroy.

## 2013-07-18 ENCOUNTER — Telehealth: Payer: Self-pay

## 2013-07-18 NOTE — Telephone Encounter (Signed)
Doppler negative.  Gave pt platelet count and hemoglobin from 5/1.  Pt voiced understanding.

## 2013-09-13 ENCOUNTER — Other Ambulatory Visit: Payer: Self-pay | Admitting: *Deleted

## 2013-09-13 DIAGNOSIS — D473 Essential (hemorrhagic) thrombocythemia: Secondary | ICD-10-CM

## 2013-09-14 ENCOUNTER — Other Ambulatory Visit (HOSPITAL_BASED_OUTPATIENT_CLINIC_OR_DEPARTMENT_OTHER): Payer: Medicare Other

## 2013-09-14 DIAGNOSIS — D473 Essential (hemorrhagic) thrombocythemia: Secondary | ICD-10-CM

## 2013-09-14 LAB — CBC WITH DIFFERENTIAL/PLATELET
BASO%: 1.1 % (ref 0.0–2.0)
BASOS ABS: 0.1 10*3/uL (ref 0.0–0.1)
EOS ABS: 1 10*3/uL — AB (ref 0.0–0.5)
EOS%: 15.6 % — ABNORMAL HIGH (ref 0.0–7.0)
HCT: 36.5 % (ref 34.8–46.6)
HEMOGLOBIN: 11.7 g/dL (ref 11.6–15.9)
LYMPH%: 14.4 % (ref 14.0–49.7)
MCH: 33.3 pg (ref 25.1–34.0)
MCHC: 32.1 g/dL (ref 31.5–36.0)
MCV: 104 fL — AB (ref 79.5–101.0)
MONO#: 0.5 10*3/uL (ref 0.1–0.9)
MONO%: 7.1 % (ref 0.0–14.0)
NEUT#: 3.9 10*3/uL (ref 1.5–6.5)
NEUT%: 61.8 % (ref 38.4–76.8)
Platelets: 268 10*3/uL (ref 145–400)
RBC: 3.51 10*6/uL — ABNORMAL LOW (ref 3.70–5.45)
RDW: 13 % (ref 11.2–14.5)
WBC: 6.3 10*3/uL (ref 3.9–10.3)
lymph#: 0.9 10*3/uL (ref 0.9–3.3)
nRBC: 0 % (ref 0–0)

## 2013-09-14 LAB — COMPREHENSIVE METABOLIC PANEL (CC13)
ALT: 13 U/L (ref 0–55)
ANION GAP: 8 meq/L (ref 3–11)
AST: 14 U/L (ref 5–34)
Albumin: 4 g/dL (ref 3.5–5.0)
Alkaline Phosphatase: 53 U/L (ref 40–150)
BUN: 21 mg/dL (ref 7.0–26.0)
CALCIUM: 9.4 mg/dL (ref 8.4–10.4)
CO2: 28 meq/L (ref 22–29)
Chloride: 105 mEq/L (ref 98–109)
Creatinine: 0.9 mg/dL (ref 0.6–1.1)
GLUCOSE: 123 mg/dL (ref 70–140)
Potassium: 4.4 mEq/L (ref 3.5–5.1)
SODIUM: 141 meq/L (ref 136–145)
TOTAL PROTEIN: 6.9 g/dL (ref 6.4–8.3)
Total Bilirubin: 0.32 mg/dL (ref 0.20–1.20)

## 2013-09-15 ENCOUNTER — Other Ambulatory Visit: Payer: Self-pay | Admitting: Oncology

## 2013-11-09 ENCOUNTER — Other Ambulatory Visit: Payer: Self-pay | Admitting: *Deleted

## 2013-11-09 DIAGNOSIS — D473 Essential (hemorrhagic) thrombocythemia: Secondary | ICD-10-CM

## 2013-11-09 MED ORDER — HYDROXYUREA 500 MG PO CAPS: ORAL_CAPSULE | ORAL | Status: DC

## 2013-11-15 ENCOUNTER — Other Ambulatory Visit: Payer: Self-pay | Admitting: *Deleted

## 2013-11-15 DIAGNOSIS — D649 Anemia, unspecified: Secondary | ICD-10-CM

## 2013-11-15 DIAGNOSIS — D473 Essential (hemorrhagic) thrombocythemia: Secondary | ICD-10-CM

## 2013-11-16 ENCOUNTER — Other Ambulatory Visit (HOSPITAL_BASED_OUTPATIENT_CLINIC_OR_DEPARTMENT_OTHER): Payer: Medicare Other

## 2013-11-16 DIAGNOSIS — D649 Anemia, unspecified: Secondary | ICD-10-CM

## 2013-11-16 DIAGNOSIS — D473 Essential (hemorrhagic) thrombocythemia: Secondary | ICD-10-CM

## 2013-11-16 LAB — COMPREHENSIVE METABOLIC PANEL (CC13)
ALT: 14 U/L (ref 0–55)
ANION GAP: 7 meq/L (ref 3–11)
AST: 14 U/L (ref 5–34)
Albumin: 3.8 g/dL (ref 3.5–5.0)
Alkaline Phosphatase: 56 U/L (ref 40–150)
BUN: 23.7 mg/dL (ref 7.0–26.0)
CO2: 26 meq/L (ref 22–29)
Calcium: 9 mg/dL (ref 8.4–10.4)
Chloride: 107 mEq/L (ref 98–109)
Creatinine: 1 mg/dL (ref 0.6–1.1)
Glucose: 117 mg/dl (ref 70–140)
POTASSIUM: 4.4 meq/L (ref 3.5–5.1)
SODIUM: 140 meq/L (ref 136–145)
TOTAL PROTEIN: 6.9 g/dL (ref 6.4–8.3)
Total Bilirubin: 0.26 mg/dL (ref 0.20–1.20)

## 2013-11-16 LAB — CBC WITH DIFFERENTIAL/PLATELET
BASO%: 1.5 % (ref 0.0–2.0)
Basophils Absolute: 0.1 10*3/uL (ref 0.0–0.1)
EOS%: 13.7 % — ABNORMAL HIGH (ref 0.0–7.0)
Eosinophils Absolute: 0.9 10*3/uL — ABNORMAL HIGH (ref 0.0–0.5)
HEMATOCRIT: 36.1 % (ref 34.8–46.6)
HGB: 11.6 g/dL (ref 11.6–15.9)
LYMPH#: 0.8 10*3/uL — AB (ref 0.9–3.3)
LYMPH%: 11.5 % — ABNORMAL LOW (ref 14.0–49.7)
MCH: 33.1 pg (ref 25.1–34.0)
MCHC: 32 g/dL (ref 31.5–36.0)
MCV: 103.4 fL — ABNORMAL HIGH (ref 79.5–101.0)
MONO#: 0.4 10*3/uL (ref 0.1–0.9)
MONO%: 5.9 % (ref 0.0–14.0)
NEUT#: 4.4 10*3/uL (ref 1.5–6.5)
NEUT%: 67.4 % (ref 38.4–76.8)
Platelets: 310 10*3/uL (ref 145–400)
RBC: 3.49 10*6/uL — ABNORMAL LOW (ref 3.70–5.45)
RDW: 13.7 % (ref 11.2–14.5)
WBC: 6.6 10*3/uL (ref 3.9–10.3)

## 2013-11-16 LAB — FERRITIN CHCC: Ferritin: 13 ng/ml (ref 9–269)

## 2013-11-20 ENCOUNTER — Encounter (HOSPITAL_COMMUNITY): Payer: Self-pay | Admitting: Emergency Medicine

## 2013-11-20 ENCOUNTER — Emergency Department (HOSPITAL_COMMUNITY)
Admission: EM | Admit: 2013-11-20 | Discharge: 2013-11-20 | Disposition: A | Discharge status: Home / Self Care | Payer: Medicare Other | Attending: Emergency Medicine | Admitting: Emergency Medicine

## 2013-11-20 ENCOUNTER — Emergency Department (HOSPITAL_COMMUNITY): Payer: Medicare Other

## 2013-11-20 DIAGNOSIS — F411 Generalized anxiety disorder: Secondary | ICD-10-CM | POA: Insufficient documentation

## 2013-11-20 DIAGNOSIS — E785 Hyperlipidemia, unspecified: Secondary | ICD-10-CM | POA: Diagnosis not present

## 2013-11-20 DIAGNOSIS — Z862 Personal history of diseases of the blood and blood-forming organs and certain disorders involving the immune mechanism: Secondary | ICD-10-CM | POA: Diagnosis not present

## 2013-11-20 DIAGNOSIS — Z79899 Other long term (current) drug therapy: Secondary | ICD-10-CM | POA: Diagnosis not present

## 2013-11-20 DIAGNOSIS — R002 Palpitations: Secondary | ICD-10-CM | POA: Diagnosis not present

## 2013-11-20 DIAGNOSIS — R079 Chest pain, unspecified: Secondary | ICD-10-CM | POA: Diagnosis present

## 2013-11-20 DIAGNOSIS — Z7982 Long term (current) use of aspirin: Secondary | ICD-10-CM | POA: Insufficient documentation

## 2013-11-20 DIAGNOSIS — K219 Gastro-esophageal reflux disease without esophagitis: Secondary | ICD-10-CM | POA: Insufficient documentation

## 2013-11-20 DIAGNOSIS — R0602 Shortness of breath: Secondary | ICD-10-CM | POA: Insufficient documentation

## 2013-11-20 DIAGNOSIS — E039 Hypothyroidism, unspecified: Secondary | ICD-10-CM | POA: Insufficient documentation

## 2013-11-20 DIAGNOSIS — R0789 Other chest pain: Secondary | ICD-10-CM | POA: Diagnosis not present

## 2013-11-20 LAB — COMPREHENSIVE METABOLIC PANEL
ALT: 10 U/L (ref 0–35)
AST: 14 U/L (ref 0–37)
Albumin: 3.7 g/dL (ref 3.5–5.2)
Alkaline Phosphatase: 49 U/L (ref 39–117)
Anion gap: 13 (ref 5–15)
BUN: 24 mg/dL — ABNORMAL HIGH (ref 6–23)
CALCIUM: 9.7 mg/dL (ref 8.4–10.5)
CHLORIDE: 106 meq/L (ref 96–112)
CO2: 25 mEq/L (ref 19–32)
CREATININE: 0.92 mg/dL (ref 0.50–1.10)
GFR, EST AFRICAN AMERICAN: 69 mL/min — AB (ref >90)
GFR, EST NON AFRICAN AMERICAN: 60 mL/min — AB (ref >90)
GLUCOSE: 124 mg/dL — AB (ref 70–99)
Potassium: 4.1 mEq/L (ref 3.7–5.3)
Sodium: 144 mEq/L (ref 137–147)
Total Bilirubin: 0.2 mg/dL — ABNORMAL LOW (ref 0.3–1.2)
Total Protein: 6.6 g/dL (ref 6.0–8.3)

## 2013-11-20 LAB — URINALYSIS, ROUTINE W REFLEX MICROSCOPIC
Bilirubin Urine: NEGATIVE
Glucose, UA: NEGATIVE mg/dL
Hgb urine dipstick: NEGATIVE
KETONES UR: NEGATIVE mg/dL
LEUKOCYTES UA: NEGATIVE
NITRITE: NEGATIVE
PROTEIN: NEGATIVE mg/dL
Specific Gravity, Urine: 1.019 (ref 1.005–1.030)
UROBILINOGEN UA: 0.2 mg/dL (ref 0.0–1.0)
pH: 5 (ref 5.0–8.0)

## 2013-11-20 LAB — CBC WITH DIFFERENTIAL/PLATELET
BASOS ABS: 0.1 10*3/uL (ref 0.0–0.1)
Basophils Relative: 1 % (ref 0–1)
EOS ABS: 0.7 10*3/uL (ref 0.0–0.7)
EOS PCT: 13 % — AB (ref 0–5)
HCT: 35.2 % — ABNORMAL LOW (ref 36.0–46.0)
Hemoglobin: 11.3 g/dL — ABNORMAL LOW (ref 12.0–15.0)
LYMPHS ABS: 0.9 10*3/uL (ref 0.7–4.0)
Lymphocytes Relative: 17 % (ref 12–46)
MCH: 33.5 pg (ref 26.0–34.0)
MCHC: 32.1 g/dL (ref 30.0–36.0)
MCV: 104.5 fL — AB (ref 78.0–100.0)
Monocytes Absolute: 0.4 10*3/uL (ref 0.1–1.0)
Monocytes Relative: 7 % (ref 3–12)
Neutro Abs: 3.4 10*3/uL (ref 1.7–7.7)
Neutrophils Relative %: 62 % (ref 43–77)
PLATELETS: 311 10*3/uL (ref 150–400)
RBC: 3.37 MIL/uL — AB (ref 3.87–5.11)
RDW: 13.3 % (ref 11.5–15.5)
WBC: 5.6 10*3/uL (ref 4.0–10.5)

## 2013-11-20 LAB — I-STAT TROPONIN, ED
TROPONIN I, POC: 0.02 ng/mL (ref 0.00–0.08)
Troponin i, poc: 0.01 ng/mL (ref 0.00–0.08)

## 2013-11-20 MED ORDER — NITROGLYCERIN 0.4 MG SL SUBL
0.4000 mg | SUBLINGUAL_TABLET | SUBLINGUAL | Status: DC | PRN
  Administered 2013-11-20: 0.4 mg via SUBLINGUAL
  Filled 2013-11-20: qty 1

## 2013-11-20 MED ORDER — ASPIRIN 81 MG PO CHEW
324.0000 mg | CHEWABLE_TABLET | Freq: Once | ORAL | Status: AC
  Administered 2013-11-20: 324 mg via ORAL
  Filled 2013-11-20: qty 4

## 2013-11-20 NOTE — ED Provider Notes (Signed)
CSN: [id number]     Arrival date & time 11/20/13  0503 History   First MD Initiated Contact with Patient 11/20/13 0542     Chief Complaint  Patient presents with  . Chest Pain  . Shortness of Breath     (Consider location/radiation/quality/duration/timing/severity/associated sxs/prior Treatment) Patient is a 73 y.o. female presenting with chest pain and shortness of breath. The history is provided by the patient.  Chest Pain Associated symptoms: shortness of breath   Shortness of Breath Associated symptoms: chest pain   She has been having episodes of sense of her heart is racing and a tight feeling in her chest with associated lightheadedness and mild nausea. These episodes started 3 days ago but the worst episode started at about 10:00 last night. She went to sleep and woke up and his symptoms were still there. She tried taking a nitroglycerin which suggests likely. She took some TUMS which did not seem to affect it. She states that C. still feels some mild tightness in her chest which she rates at 4/10 and she still feels like her heart is racing although not as much as it had been. She does take aspirin daily because of an elevated platelet count. She had a heart catheterization in March which did not show any significant obstruction. 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 She also relates that she is under a significant amount of stress.  Past Medical History  Diagnosis Date  . Thrombocytosis 03/11/2011  . Hypothyroidism   . Hyperlipidemia   . GERD (gastroesophageal reflux disease)   . IBS (irritable bowel syndrome)   . Anxiety    Past Surgical History  Procedure Laterality Date  . Tonsillectomy    . Cataract extraction    . Cholecystectomy, laparoscopic    . Abdominal hysterectomy    . Toe surgery     History reviewed. No pertinent family history. History  Substance Use Topics  .  Smoking status: Never Smoker   . Smokeless tobacco: Never Used  . Alcohol Use: No   OB History   Grav Para Term Preterm Abortions TAB SAB Ect Mult Living                 Review of Systems  Respiratory: Positive for shortness of breath.   Cardiovascular: Positive for chest pain.  All other systems reviewed and are negative.     Allergies  Iodine and Shellfish allergy  Home Medications   Prior to Admission medications   Medication Sig Start Date End Date Taking? Authorizing Provider  acetaminophen (TYLENOL) 325 MG tablet Take 650 mg by mouth daily as needed for mild pain.    Historical Provider, MD  ALPRAZolam Duanne Moron) 0.25 MG tablet Take 0.125 mg by mouth at bedtime as needed for anxiety.    Historical Provider, MD  aspirin 325 MG tablet Take 325 mg by mouth daily.     Historical Provider, MD  Cholecalciferol (VITAMIN D) 2000 UNITS CAPS Take 1 capsule by mouth daily.    Historical Provider, MD  Evening Primrose Oil 500 MG CAPS Take 1 capsule by mouth 2 (two) times daily.    Historical Provider, MD  hydroxyurea (HYDREA) 500 MG capsule TAKE 1 CAPSULE BY MOUTH ONCE DAILY *MAY TAKE W/FOOD TO MINIMIZE GI SIDE EFFECTS 11/09/13   Minette Headland, NP  levothyroxine (SYNTHROID, LEVOTHROID) 100 MCG tablet Take 100 mcg by mouth daily.  Historical Provider, MD  metroNIDAZOLE (METROCREAM) 0.75 % cream Apply 1 application topically daily as needed (for rosacea).    Historical Provider, MD  Multiple Vitamin (MULTIVITAMIN) tablet Take 1 tablet by mouth daily.     Historical Provider, MD  ranitidine (ZANTAC) 150 MG capsule Take 150 mg by mouth 2 (two) times daily.     Historical Provider, MD   BP 168/87  Pulse 99  Temp(Src) 97.7 F (36.5 C) (Oral)  Resp 18  Ht 5\' 9"  (1.753 m)  Wt 165 lb (74.844 kg)  BMI 24.36 kg/m2  SpO2 100% Physical Exam  Nursing note and vitals reviewed.  73 year old female, resting comfortably and in no acute distress. Vital signs are significant for  hypertension. Oxygen saturation is 100%, which is normal. Head is normocephalic and atraumatic. PERRLA, EOMI. Oropharynx is clear. Neck is nontender and supple without adenopathy or JVD. Back is nontender and there is no CVA tenderness. Lungs are clear without rales, wheezes, or rhonchi. Chest is nontender. Heart has regular rate and rhythm without murmur. Abdomen is soft, flat, nontender without masses or hepatosplenomegaly and peristalsis is normoactive. Extremities have no cyanosis or edema, full range of motion is present. Skin is warm and dry without rash. Neurologic: Mental status is normal, cranial nerves are intact, there are no motor or sensory deficits.  ED Course  Procedures (including critical care time) Labs Review Results for orders placed during the hospital encounter of 11/20/13  COMPREHENSIVE METABOLIC PANEL      Result Value Ref Range   Sodium 144  137 - 147 mEq/L   Potassium 4.1  3.7 - 5.3 mEq/L   Chloride 106  96 - 112 mEq/L   CO2 25  19 - 32 mEq/L   Glucose, Bld 124 (*) 70 - 99 mg/dL   BUN 24 (*) 6 - 23 mg/dL   Creatinine, Ser 0.92  0.50 - 1.10 mg/dL   Calcium 9.7  8.4 - 10.5 mg/dL   Total Protein 6.6  6.0 - 8.3 g/dL   Albumin 3.7  3.5 - 5.2 g/dL   AST 14  0 - 37 U/L   ALT 10  0 - 35 U/L   Alkaline Phosphatase 49  39 - 117 U/L   Total Bilirubin 0.2 (*) 0.3 - 1.2 mg/dL   GFR calc non Af Amer 60 (*) >90 mL/min   GFR calc Af Amer 69 (*) >90 mL/min   Anion gap 13  5 - 15  CBC WITH DIFFERENTIAL      Result Value Ref Range   WBC 5.6  4.0 - 10.5 K/uL   RBC 3.37 (*) 3.87 - 5.11 MIL/uL   Hemoglobin 11.3 (*) 12.0 - 15.0 g/dL   HCT 35.2 (*) 36.0 - 46.0 %   MCV 104.5 (*) 78.0 - 100.0 fL   MCH 33.5  26.0 - 34.0 pg   MCHC 32.1  30.0 - 36.0 g/dL   RDW 13.3  11.5 - 15.5 %   Platelets 311  150 - 400 K/uL   Neutrophils Relative % 62  43 - 77 %   Neutro Abs 3.4  1.7 - 7.7 K/uL   Lymphocytes Relative 17  12 - 46 %   Lymphs Abs 0.9  0.7 - 4.0 K/uL   Monocytes  Relative 7  3 - 12 %   Monocytes Absolute 0.4  0.1 - 1.0 K/uL   Eosinophils Relative 13 (*) 0 - 5 %   Eosinophils Absolute 0.7  0.0 - 0.7 K/uL  Basophils Relative 1  0 - 1 %   Basophils Absolute 0.1  0.0 - 0.1 K/uL  URINALYSIS, ROUTINE W REFLEX MICROSCOPIC      Result Value Ref Range   Color, Urine YELLOW  YELLOW   APPearance CLEAR  CLEAR   Specific Gravity, Urine 1.019  1.005 - 1.030   pH 5.0  5.0 - 8.0   Glucose, UA NEGATIVE  NEGATIVE mg/dL   Hgb urine dipstick NEGATIVE  NEGATIVE   Bilirubin Urine NEGATIVE  NEGATIVE   Ketones, ur NEGATIVE  NEGATIVE mg/dL   Protein, ur NEGATIVE  NEGATIVE mg/dL   Urobilinogen, UA 0.2  0.0 - 1.0 mg/dL   Nitrite NEGATIVE  NEGATIVE   Leukocytes, UA NEGATIVE  NEGATIVE  I-STAT TROPOININ, ED      Result Value Ref Range   Troponin i, poc 0.01  0.00 - 0.08 ng/mL   Comment 3            Imaging Review Dg Chest 2 View  11/20/2013   CLINICAL DATA:  Midsternal chest pain and shortness of breath for a few hr.  EXAM: CHEST  2 VIEW  COMPARISON:  Chest radiograph July 12, 2012  FINDINGS: Cardiomediastinal silhouette is unremarkable. The lungs are clear without pleural effusions or focal consolidations. Trachea projects midline and there is no pneumothorax. Soft tissue planes and included osseous structures are non-suspicious. Surgical clips in the included right abdomen likely reflect cholecystectomy.  IMPRESSION: No active cardiopulmonary disease.   Electronically Signed   By: Elon Alas   On: 11/20/2013 06:30     Date: 11/20/2013  Rate: 96  Rhythm: normal sinus rhythm  QRS Axis: left  Intervals: normal  ST/T Wave abnormalities: normal  Conduction Disutrbances:none  Narrative Interpretation: Left axis deviation. When compared with ECG of 06/08/2013, no significant changes are seen.  Old EKG Reviewed: unchanged    MDM   Final diagnoses:  Chest pressure  Palpitations    Palpitations with associated chest pain. Old records are reviewed and she  did have a cardiac catheterization in March which showed 30% occlusion of a single vessel and no evidence of measurable stenosis. Patient continues to have subjective palpitations even though her heart rate is normal on monitor. I do wonder whether she was having actual tachycardia and her mother she was having angina related to demand ischemia. She'll be given nitroglycerin in the ED.  She feels better this point but is unsure if it is from nitroglycerin. She'll be kept in the ED for a repeat troponin level. If negative, she will be discharged with instructions to follow up with her cardiologist. May need to consider Holter monitor or event monitor. Case is signed out to Dr. Sabra Heck to evaluate repeat troponin.  Delora Fuel, MD 16/10/96 0454

## 2013-11-20 NOTE — Discharge Instructions (Signed)
Talk with your cardiologist about possibly having either an event monitor or a Holter monitor.  Chest Pain (Nonspecific) It is often hard to give a specific diagnosis for the cause of chest pain. There is always a chance that your pain could be related to something serious, such as a heart attack or a blood clot in the lungs. You need to follow up with your health care provider for further evaluation. CAUSES   Heartburn.  Pneumonia or bronchitis.  Anxiety or stress.  Inflammation around your heart (pericarditis) or lung (pleuritis or pleurisy).  A blood clot in the lung.  A collapsed lung (pneumothorax). It can develop suddenly on its own (spontaneous pneumothorax) or from trauma to the chest.  Shingles infection (herpes zoster virus). The chest wall is composed of bones, muscles, and cartilage. Any of these can be the source of the pain.  The bones can be bruised by injury.  The muscles or cartilage can be strained by coughing or overwork.  The cartilage can be affected by inflammation and become sore (costochondritis). DIAGNOSIS  Lab tests or other studies may be needed to find the cause of your pain. Your health care provider may have you take a test called an ambulatory electrocardiogram (ECG). An ECG records your heartbeat patterns over a 24-hour period. You may also have other tests, such as:  Transthoracic echocardiogram (TTE). During echocardiography, sound waves are used to evaluate how blood flows through your heart.  Transesophageal echocardiogram (TEE).  Cardiac monitoring. This allows your health care provider to monitor your heart rate and rhythm in real time.  Holter monitor. This is a portable device that records your heartbeat and can help diagnose heart arrhythmias. It allows your health care provider to track your heart activity for several days, if needed.  Stress tests by exercise or by giving medicine that makes the heart beat faster. TREATMENT    Treatment depends on what may be causing your chest pain. Treatment may include:  Acid blockers for heartburn.  Anti-inflammatory medicine.  Pain medicine for inflammatory conditions.  Antibiotics if an infection is present.  You may be advised to change lifestyle habits. This includes stopping smoking and avoiding alcohol, caffeine, and chocolate.  You may be advised to keep your head raised (elevated) when sleeping. This reduces the chance of acid going backward from your stomach into your esophagus. Most of the time, nonspecific chest pain will improve within 2-3 days with rest and mild pain medicine.  HOME CARE INSTRUCTIONS   If antibiotics were prescribed, take them as directed. Finish them even if you start to feel better.  For the next few days, avoid physical activities that bring on chest pain. Continue physical activities as directed.  Do not use any tobacco products, including cigarettes, chewing tobacco, or electronic cigarettes.  Avoid drinking alcohol.  Only take medicine as directed by your health care provider.  Follow your health care provider's suggestions for further testing if your chest pain does not go away.  Keep any follow-up appointments you made. If you do not go to an appointment, you could develop lasting (chronic) problems with pain. If there is any problem keeping an appointment, call to reschedule. SEEK MEDICAL CARE IF:   Your chest pain does not go away, even after treatment.  You have a rash with blisters on your chest.  You have a fever. SEEK IMMEDIATE MEDICAL CARE IF:   You have increased chest pain or pain that spreads to your arm, neck, jaw, back, or  abdomen.  You have shortness of breath.  You have an increasing cough, or you cough up blood.  You have severe back or abdominal pain.  You feel nauseous or vomit.  You have severe weakness.  You faint.  You have chills. This is an emergency. Do not wait to see if the pain will  go away. Get medical help at once. Call your local emergency services (911 in [place] Do not drive yourself to the hospital. MAKE SURE YOU:   Understand these instructions.  Will watch your condition.  Will get help right away if you are not doing well or get worse. Document Released: 12/10/2004 Document Revised: 03/07/2013 Document Reviewed: 10/06/2007 [facility] Patient Information 2015 [place], [place]. This information is not intended to replace advice given to you by your health care provider. Make sure you discuss any questions you have with your health care provider.  Palpitations A palpitation is the feeling that your heartbeat is irregular or is faster than normal. It may feel like your heart is fluttering or skipping a beat. Palpitations are usually not a serious problem. However, in some cases, you may need further medical evaluation. CAUSES  Palpitations can be caused by:  Smoking.  Caffeine or other stimulants, such as diet pills or energy drinks.  Alcohol.  Stress and anxiety.  Strenuous physical activity.  Fatigue.  Certain medicines.  Heart disease, especially if you have a history of irregular heart rhythms (arrhythmias), such as atrial fibrillation, atrial flutter, or supraventricular tachycardia.  An improperly working pacemaker or defibrillator. DIAGNOSIS  To find the cause of your palpitations, your health care provider will take your medical history and perform a physical exam. Your health care provider may also have you take a test called an ambulatory electrocardiogram (ECG). An ECG records your heartbeat patterns over a 24-hour period. You may also have other tests, such as:  Transthoracic echocardiogram (TTE). During echocardiography, sound waves are used to evaluate how blood flows through your heart.  Transesophageal echocardiogram (TEE).  Cardiac monitoring. This allows your health care provider to monitor your heart rate and rhythm in real  time.  Holter monitor. This is a portable device that records your heartbeat and can help diagnose heart arrhythmias. It allows your health care provider to track your heart activity for several days, if needed.  Stress tests by exercise or by giving medicine that makes the heart beat faster. TREATMENT  Treatment of palpitations depends on the cause of your symptoms and can vary greatly. Most cases of palpitations do not require any treatment other than time, relaxation, and monitoring your symptoms. Other causes, such as atrial fibrillation, atrial flutter, or supraventricular tachycardia, usually require further treatment. HOME CARE INSTRUCTIONS   Avoid:  Caffeinated coffee, tea, soft drinks, diet pills, and energy drinks.  Chocolate.  Alcohol.  Stop smoking if you smoke.  Reduce your stress and anxiety. Things that can help you relax include:  A method of controlling things in your body, such as your heartbeats, with your mind (biofeedback).  Yoga.  Meditation.  Physical activity such as swimming, jogging, or walking.  Get plenty of rest and sleep. SEEK MEDICAL CARE IF:   You continue to have a fast or irregular heartbeat beyond 24 hours.  Your palpitations occur more often. SEEK IMMEDIATE MEDICAL CARE IF:  You have chest pain or shortness of breath.  You have a severe headache.  You feel dizzy or you faint. MAKE SURE YOU:  Understand these instructions.  Will watch your condition.  Will get help right away if you are not doing well or get worse. Document Released: 02/28/2000 Document Revised: 03/07/2013 Document Reviewed: 05/01/2011 [facility] Patient Information 2015 [place], [place]. This information is not intended to replace advice given to you by your health care provider. Make sure you discuss any questions you have with your health care provider.

## 2013-11-20 NOTE — ED Notes (Signed)
C/o episodes of CP, comes and goes, 2 episodes since Saturday, last episode started Sunday night and has lasted, endorses concurrent sx of light headeness, dizziness, some sob, nausea, weakness, increased shakiness, and intermittent chills, (denies: LOC or diaphoresis), at this time reports CP, sob and light headed. Sx have lessened and "eased up considerably". Took 1 ntg at 0330. Also took two tums (h/o IBS), reports recent increased activity and work around house. Alert, NAD, calm. Family at Bs.

## 2013-11-22 ENCOUNTER — Telehealth: Payer: Self-pay | Admitting: *Deleted

## 2013-11-22 NOTE — Telephone Encounter (Signed)
Called and spoke to pt about iron studies that were not drawn when she came in for labs. Offered to pt that she could come and have them drawn this week or next or we can do them at her next appt on 01/12/14. Pt said she would wait till her next appt. I did go over lab results with pt. Pt verbalized understanding. No further concerns. Message to be forwarded to Charlestine Massed, NP.

## 2014-01-11 ENCOUNTER — Other Ambulatory Visit: Payer: Self-pay | Admitting: *Deleted

## 2014-01-11 DIAGNOSIS — I209 Angina pectoris, unspecified: Secondary | ICD-10-CM

## 2014-01-12 ENCOUNTER — Other Ambulatory Visit (HOSPITAL_BASED_OUTPATIENT_CLINIC_OR_DEPARTMENT_OTHER): Payer: Medicare Other | Admitting: Adult Health

## 2014-01-12 ENCOUNTER — Telehealth: Payer: Self-pay | Admitting: Adult Health

## 2014-01-12 ENCOUNTER — Ambulatory Visit (HOSPITAL_BASED_OUTPATIENT_CLINIC_OR_DEPARTMENT_OTHER): Payer: Medicare Other | Admitting: Adult Health

## 2014-01-12 ENCOUNTER — Encounter: Payer: Self-pay | Admitting: Adult Health

## 2014-01-12 ENCOUNTER — Other Ambulatory Visit (HOSPITAL_BASED_OUTPATIENT_CLINIC_OR_DEPARTMENT_OTHER): Payer: Medicare Other

## 2014-01-12 VITALS — BP 140/60 | HR 96 | Temp 98.9°F | Resp 18 | Ht 69.0 in | Wt 166.4 lb

## 2014-01-12 DIAGNOSIS — D473 Essential (hemorrhagic) thrombocythemia: Secondary | ICD-10-CM

## 2014-01-12 DIAGNOSIS — I209 Angina pectoris, unspecified: Secondary | ICD-10-CM

## 2014-01-12 DIAGNOSIS — Z79899 Other long term (current) drug therapy: Secondary | ICD-10-CM | POA: Insufficient documentation

## 2014-01-12 DIAGNOSIS — Z7982 Long term (current) use of aspirin: Secondary | ICD-10-CM

## 2014-01-12 LAB — CBC WITH DIFFERENTIAL/PLATELET
BASO%: 1.3 % (ref 0.0–2.0)
Basophils Absolute: 0.1 10*3/uL (ref 0.0–0.1)
EOS ABS: 0.6 10*3/uL — AB (ref 0.0–0.5)
EOS%: 8.4 % — ABNORMAL HIGH (ref 0.0–7.0)
HCT: 35.3 % (ref 34.8–46.6)
HGB: 11.3 g/dL — ABNORMAL LOW (ref 11.6–15.9)
LYMPH%: 13.7 % — AB (ref 14.0–49.7)
MCH: 33.1 pg (ref 25.1–34.0)
MCHC: 31.9 g/dL (ref 31.5–36.0)
MCV: 103.6 fL — AB (ref 79.5–101.0)
MONO#: 0.4 10*3/uL (ref 0.1–0.9)
MONO%: 6.5 % (ref 0.0–14.0)
NEUT%: 70.1 % (ref 38.4–76.8)
NEUTROS ABS: 4.7 10*3/uL (ref 1.5–6.5)
PLATELETS: 316 10*3/uL (ref 145–400)
RBC: 3.41 10*6/uL — ABNORMAL LOW (ref 3.70–5.45)
RDW: 14.1 % (ref 11.2–14.5)
WBC: 6.6 10*3/uL (ref 3.9–10.3)
lymph#: 0.9 10*3/uL (ref 0.9–3.3)

## 2014-01-12 LAB — COMPREHENSIVE METABOLIC PANEL (CC13)
ALT: 13 U/L (ref 0–55)
AST: 14 U/L (ref 5–34)
Albumin: 4 g/dL (ref 3.5–5.0)
Alkaline Phosphatase: 52 U/L (ref 40–150)
Anion Gap: 9 mEq/L (ref 3–11)
BILIRUBIN TOTAL: 0.27 mg/dL (ref 0.20–1.20)
BUN: 18.2 mg/dL (ref 7.0–26.0)
CO2: 25 mEq/L (ref 22–29)
Calcium: 9.2 mg/dL (ref 8.4–10.4)
Chloride: 108 mEq/L (ref 98–109)
Creatinine: 1 mg/dL (ref 0.6–1.1)
GLUCOSE: 120 mg/dL (ref 70–140)
Potassium: 4.3 mEq/L (ref 3.5–5.1)
Sodium: 141 mEq/L (ref 136–145)
TOTAL PROTEIN: 6.8 g/dL (ref 6.4–8.3)

## 2014-01-12 LAB — IRON AND TIBC CHCC
%SAT: 15 % — ABNORMAL LOW (ref 21–57)
Iron: 51 ug/dL (ref 41–142)
TIBC: 351 ug/dL (ref 236–444)
UIBC: 300 ug/dL (ref 120–384)

## 2014-01-12 LAB — FERRITIN CHCC: FERRITIN: 12 ng/mL (ref 9–269)

## 2014-01-12 NOTE — Patient Instructions (Signed)
You are doing well.  Continue taking Hydrea 500mg  daily.  You will return in 6 months for f/u with Dr. Lindi Adie.  Please call us if you have any questions or concerns.

## 2014-01-12 NOTE — Progress Notes (Addendum)
CC: Deanna Hill. Little, M.D.  Jeryl Columbia, M.D.   DIAGNOSIS:  73 year old female with essential thrombocytosis with a JAK2 mutation.  CURRENT THERAPY: Hydrea 500 mg daily with  Full-dose aspirin.  INTERVAL HISTORY:  Deanna Hill is here for followup visit for her essential thrombocytosis.  She is taking Hydrea daily for her essential thrombocytosis.  She is very fatigued and occasionally experiences vertigo and dizziness.  This lasted about one week.  She also describes intestinal problems related to her IBS with stomach discomfort and alternating between constipation and diarrhea.  She says her depression has improved.  She says that November is a difficult time for her because 5 years ago her son passed, 4 years ago her mom passed, and 3 years ago her husband passed.  She does get lonely.  She denies any further questions or concerns.   REVIEW OF SYSTEMS:  A 10 point review of systems was conducted and is otherwise negative except for what is noted above.    Past Medical History  Diagnosis Date  . Thrombocytosis 03/11/2011  . Hypothyroidism   . Hyperlipidemia   . GERD (gastroesophageal reflux disease)   . IBS (irritable bowel syndrome)   . Anxiety    Past Surgical History  Procedure Laterality Date  . Tonsillectomy    . Cataract extraction    . Cholecystectomy, laparoscopic    . Abdominal hysterectomy    . Toe surgery     No family history on file. History   Social History  . Marital Status: Widowed    Spouse Name: N/A    Number of Children: N/A  . Years of Education: N/A   Social History Main Topics  . Smoking status: Never Smoker   . Smokeless tobacco: Never Used  . Alcohol Use: No  . Drug Use: No  . Sexual Activity: Not Currently   Other Topics Concern  . None   Social History Narrative  . None     MEDICATIONS:  Current Outpatient Prescriptions  Medication Sig Dispense Refill  . acetaminophen (TYLENOL) 325 MG tablet Take 650 mg by mouth daily as needed for  mild pain.      [name] ALPRAZolam (XANAX) 0.25 MG tablet Take 0.125 mg by mouth at bedtime as needed for anxiety.      [name] aspirin 325 MG tablet Take 325 mg by mouth daily.       . calcium carbonate (TUMS - DOSED IN MG ELEMENTAL CALCIUM) 500 MG chewable tablet Chew 2 tablets by mouth daily as needed for indigestion or heartburn.      . Cholecalciferol (VITAMIN D) 2000 UNITS CAPS Take 1 capsule by mouth daily.      . Evening Primrose Oil 500 MG CAPS Take 1 capsule by mouth 2 (two) times daily.      . hydroxyurea (HYDREA) 500 MG capsule Take 500 mg by mouth daily. May take with food to minimize GI side effects.      [name] levothyroxine (SYNTHROID, LEVOTHROID) 100 MCG tablet Take 100 mcg by mouth daily.       . metroNIDAZOLE (METROCREAM) 0.75 % cream Apply 1 application topically daily as needed (for rosacea).      . Multiple Vitamin (MULTIVITAMIN) tablet Take 1 tablet by mouth daily.       . nitroGLYCERIN (NITROSTAT) 0.4 MG SL tablet Place 0.4 mg under the tongue every 5 (five) minutes as needed for chest pain.      . ranitidine (ZANTAC) 150 MG capsule Take 150 mg by  mouth 2 (two) times daily.        No current facility-administered medications for this visit.     PHYSICAL EXAMINATION:    Blood pressure 140/60, pulse 96, temperature 98.9 F (37.2 C), temperature source Oral, resp. rate 18, height $RemoveBe'5\' 9"'kkGFQxspi$  (1.753 m), weight 166 lb 6.4 oz (75.479 kg), SpO2 99.00%. GENERAL: Patient is a well appearing female in no acute distress HEENT:  Sclerae anicteric.  Oropharynx clear and moist. No ulcerations or evidence of oropharyngeal candidiasis. Neck is supple.  NODES:  No cervical, supraclavicular, or axillary lymphadenopathy palpated.  LUNGS:  Clear to auscultation bilaterally.  No wheezes or rhonchi. HEART:  Regular rate and rhythm. No murmur appreciated. ABDOMEN:  Soft, nontender.  Positive, normoactive bowel sounds. No organomegaly palpated. MSK:  No focal spinal tenderness to palpation. Full range of motion  bilaterally in the upper extremities. EXTREMITIES:  No peripheral edema.   SKIN:  Clear with no obvious rashes or skin changes. No nail dyscrasia. NEURO:  Nonfocal. Well oriented.  Appropriate affect.     LABORATORY DATA:  Lab Results  Component Value Date   WBC 6.6 01/12/2014   HGB 11.3* 01/12/2014   HCT 35.3 01/12/2014   MCV 103.6* 01/12/2014   PLT 316 01/12/2014    IMPRESSION AND PLAN:  73 year old female with:  Essential thrombocytosis with JAK2 mutation: Continue Hydrea 500 mg daily and also aspirin 325 mg by mouth once daily. She is tolerating it quite well.  Her plt count is 316 today.  She is tolerating the medication well.  She does have her b12 and iron studies monitored intermittently and these have remained stable.  They are pending today.  Deanna Hill met with Dr. Lindi Adie today.  She will continue Hydrea and Aspirin.  He will see her back in 6 months for labs and f/u.    She will call us in the interim for any questions or concerns. I spent 25 minutes counseling the patient face to face.  The total time spent in the appointment was 30 minutes.  Minette Headland, [place] [phone] 01/12/2014, 1:50 PM  Attending Note  I personally saw and examined Deanna Hill. The plan of care was discussed with her. I agree with the assessment and plan as documented above. Essential thrombocytosis: Tolerating Hydrea very well except for mild GI discomfort. Platelet count is stable, continue with the same therapy of Hydrea and aspirin. Signed Rulon Eisenmenger, MD

## 2014-01-12 NOTE — Telephone Encounter (Signed)
per pof to sch pt appt-gave pt copy of sch °

## 2014-01-13 LAB — VITAMIN B12: VITAMIN B 12: 505 pg/mL (ref 211–911)

## 2014-01-13 LAB — FOLATE RBC: RBC FOLATE: 775 ng/mL (ref >280)

## 2014-02-04 ENCOUNTER — Other Ambulatory Visit: Payer: Self-pay | Admitting: Adult Health

## 2014-02-22 ENCOUNTER — Encounter (HOSPITAL_COMMUNITY): Payer: Self-pay | Admitting: Cardiology

## 2014-06-19 ENCOUNTER — Telehealth: Payer: Self-pay

## 2014-06-19 NOTE — Telephone Encounter (Signed)
office note dtd 06/13/14 rcvd from [facility]. Reviewed by Dr. Lindi Adie.  Sent to scan.

## 2014-07-16 ENCOUNTER — Other Ambulatory Visit: Payer: Self-pay

## 2014-07-16 DIAGNOSIS — E039 Hypothyroidism, unspecified: Secondary | ICD-10-CM

## 2014-07-16 NOTE — Assessment & Plan Note (Signed)
Essential thrombocytosis with JAK2 mutation: Continue Hydrea 500 mg daily and also aspirin 325 mg by mouth once daily. She is tolerating it quite well. Her plt count is 316 today. She is tolerating the medication well. She does have her b12 and iron studies monitored intermittently and these have remained stable

## 2014-07-17 ENCOUNTER — Telehealth: Payer: Self-pay | Admitting: Hematology and Oncology

## 2014-07-17 ENCOUNTER — Ambulatory Visit (HOSPITAL_BASED_OUTPATIENT_CLINIC_OR_DEPARTMENT_OTHER): Payer: Medicare Other | Admitting: Hematology and Oncology

## 2014-07-17 ENCOUNTER — Other Ambulatory Visit (HOSPITAL_BASED_OUTPATIENT_CLINIC_OR_DEPARTMENT_OTHER): Payer: Medicare Other

## 2014-07-17 VITALS — BP 148/70 | HR 86 | Temp 98.6°F | Resp 18 | Ht 69.0 in | Wt 163.3 lb

## 2014-07-17 DIAGNOSIS — D473 Essential (hemorrhagic) thrombocythemia: Secondary | ICD-10-CM

## 2014-07-17 DIAGNOSIS — R5383 Other fatigue: Secondary | ICD-10-CM

## 2014-07-17 DIAGNOSIS — L659 Nonscarring hair loss, unspecified: Secondary | ICD-10-CM

## 2014-07-17 DIAGNOSIS — L603 Nail dystrophy: Secondary | ICD-10-CM

## 2014-07-17 DIAGNOSIS — E039 Hypothyroidism, unspecified: Secondary | ICD-10-CM

## 2014-07-17 LAB — CBC WITH DIFFERENTIAL/PLATELET
BASO%: 1.4 % (ref 0.0–2.0)
Basophils Absolute: 0.1 10*3/uL (ref 0.0–0.1)
EOS ABS: 1 10*3/uL — AB (ref 0.0–0.5)
EOS%: 16.3 % — AB (ref 0.0–7.0)
HEMATOCRIT: 34.1 % — AB (ref 34.8–46.6)
HEMOGLOBIN: 10.9 g/dL — AB (ref 11.6–15.9)
LYMPH%: 12.9 % — ABNORMAL LOW (ref 14.0–49.7)
MCH: 32.6 pg (ref 25.1–34.0)
MCHC: 32.1 g/dL (ref 31.5–36.0)
MCV: 101.8 fL — ABNORMAL HIGH (ref 79.5–101.0)
MONO#: 0.5 10*3/uL (ref 0.1–0.9)
MONO%: 8.6 % (ref 0.0–14.0)
NEUT%: 60.8 % (ref 38.4–76.8)
NEUTROS ABS: 3.7 10*3/uL (ref 1.5–6.5)
Platelets: 299 10*3/uL (ref 145–400)
RBC: 3.35 10*6/uL — ABNORMAL LOW (ref 3.70–5.45)
RDW: 13.8 % (ref 11.2–14.5)
WBC: 6.1 10*3/uL (ref 3.9–10.3)
lymph#: 0.8 10*3/uL — ABNORMAL LOW (ref 0.9–3.3)

## 2014-07-17 LAB — COMPREHENSIVE METABOLIC PANEL (CC13)
ALBUMIN: 3.9 g/dL (ref 3.5–5.0)
ALK PHOS: 52 U/L (ref 40–150)
ALT: 14 U/L (ref 0–55)
AST: 14 U/L (ref 5–34)
Anion Gap: 9 mEq/L (ref 3–11)
BUN: 19 mg/dL (ref 7.0–26.0)
CO2: 26 mEq/L (ref 22–29)
Calcium: 8.9 mg/dL (ref 8.4–10.4)
Chloride: 108 mEq/L (ref 98–109)
Creatinine: 0.8 mg/dL (ref 0.6–1.1)
EGFR: 68 mL/min/{1.73_m2} — ABNORMAL LOW (ref >90)
Glucose: 89 mg/dl (ref 70–140)
Potassium: 4.4 mEq/L (ref 3.5–5.1)
Sodium: 142 mEq/L (ref 136–145)
TOTAL PROTEIN: 6.6 g/dL (ref 6.4–8.3)
Total Bilirubin: 0.23 mg/dL (ref 0.20–1.20)

## 2014-07-17 LAB — FERRITIN CHCC: Ferritin: 9 ng/ml (ref 9–269)

## 2014-07-17 LAB — IRON AND TIBC CHCC
%SAT: 16 % — ABNORMAL LOW (ref 21–57)
Iron: 55 ug/dL (ref 41–142)
TIBC: 351 ug/dL (ref 236–444)
UIBC: 296 ug/dL (ref 120–384)

## 2014-07-17 NOTE — Telephone Encounter (Signed)
Appointments made and avs printed for patient °

## 2014-07-17 NOTE — Progress Notes (Signed)
Patient Care Team: Hulan Fess, MD as PCP - General  DIAGNOSIS: Essential thrombocytosis  SUMMARY OF ONCOLOGIC HISTORY: Hydrea 500 mg daily CHIEF COMPLIANT: Follow-up on Hydrea  INTERVAL HISTORY: Deanna Hill is a 74 year old with above-mentioned history of essential thrombocytosis is currently in [facility] and is tolerating it very well except for mild fatigue, anemia, hair loss of brittle nails. She would like to know if she can come off Hydrea anytime soon.  REVIEW OF SYSTEMS:   Constitutional: Denies fevers, chills or abnormal weight loss Eyes: Denies blurriness of vision Ears, nose, mouth, throat, and face: Denies mucositis or sore throat Respiratory: Denies cough, dyspnea or wheezes Cardiovascular: Denies palpitation, chest discomfort or lower extremity swelling Gastrointestinal:  Denies nausea, heartburn or change in bowel habits Skin: Denies abnormal skin rashes Lymphatics: Denies new lymphadenopathy or easy bruising Neurological:Denies numbness, tingling or new weaknesses Behavioral/Psych: Mood is stable, no new changes  All other systems were reviewed with the patient and are negative.  I have reviewed the past medical history, past surgical history, social history and family history with the patient and they are unchanged from previous note.  ALLERGIES:  is allergic to iodine and shellfish allergy.  MEDICATIONS:  Current Outpatient Prescriptions  Medication Sig Dispense Refill  . acetaminophen (TYLENOL) 325 MG tablet Take 650 mg by mouth daily as needed for mild pain.    [name] ALPRAZolam (XANAX) 0.25 MG tablet Take 0.125 mg by mouth at bedtime as needed for anxiety.    [name] aspirin 325 MG tablet Take 325 mg by mouth daily.     [name] BIOTIN PO Take 1 capsule by mouth daily.    . calcium carbonate (TUMS - DOSED IN MG ELEMENTAL CALCIUM) 500 MG chewable tablet Chew 2 tablets by mouth daily as needed for indigestion or heartburn.    . Cholecalciferol (VITAMIN D) 2000 UNITS CAPS Take  1 capsule by mouth daily.    . Evening Primrose Oil 500 MG CAPS Take 1 capsule by mouth 2 (two) times daily.    . hydroxyurea (HYDREA) 500 MG capsule TAKE ONE CAPSULE EVERY DAY (MAY TAKE WITH FOOD TO MINIMIZE GI SIDE EFFECTS) 30 capsule 5  . levothyroxine (SYNTHROID, LEVOTHROID) 100 MCG tablet Take 100 mcg by mouth daily.     . metroNIDAZOLE (METROCREAM) 0.75 % cream Apply 1 application topically daily as needed (for rosacea).    . Multiple Vitamin (MULTIVITAMIN) tablet Take 1 tablet by mouth daily.     . nitroGLYCERIN (NITROSTAT) 0.4 MG SL tablet Place 0.4 mg under the tongue every 5 (five) minutes as needed for chest pain.    . ranitidine (ZANTAC) 150 MG capsule Take 150 mg by mouth 2 (two) times daily.      No current facility-administered medications for this visit.    PHYSICAL EXAMINATION: ECOG PERFORMANCE STATUS: 1 - Symptomatic but completely ambulatory  Filed Vitals:   07/17/14 1058  BP: 148/70  Pulse: 86  Temp: 98.6 F (37 C)  Resp: 18   Filed Weights   07/17/14 1058  Weight: 163 lb 4.8 oz (74.072 kg)    GENERAL:alert, no distress and comfortable SKIN: skin color, texture, turgor are normal, no rashes or significant lesions EYES: normal, Conjunctiva are pink and non-injected, sclera clear OROPHARYNX:no exudate, no erythema and lips, buccal mucosa, and tongue normal  NECK: supple, thyroid normal size, non-tender, without nodularity LYMPH:  no palpable lymphadenopathy in the cervical, axillary or inguinal LUNGS: clear to auscultation and percussion with normal breathing effort HEART: regular rate &  rhythm and no murmurs and no lower extremity edema ABDOMEN:abdomen soft, non-tender and normal bowel sounds Musculoskeletal:no cyanosis of digits and no clubbing  NEURO: alert & oriented x 3 with fluent speech, no focal motor/sensory deficits  LABORATORY DATA:  I have reviewed the data as listed   Chemistry      Component Value Date/Time   NA 142 07/17/2014 1043   NA  144 11/20/2013 0542   NA 137 09/11/2009 1024   K 4.4 07/17/2014 1043   K 4.1 11/20/2013 0542   K 4.4 09/11/2009 1024   CL 106 11/20/2013 0542   CL 107 06/24/2012 0859   CL 100 09/11/2009 1024   CO2 26 07/17/2014 1043   CO2 25 11/20/2013 0542   CO2 30 09/11/2009 1024   BUN 19.0 07/17/2014 1043   BUN 24* 11/20/2013 0542   BUN 19 09/11/2009 1024   CREATININE 0.8 07/17/2014 1043   CREATININE 0.92 11/20/2013 0542   CREATININE 0.9 09/11/2009 1024      Component Value Date/Time   CALCIUM 8.9 07/17/2014 1043   CALCIUM 9.7 11/20/2013 0542   CALCIUM 9.0 09/11/2009 1024   ALKPHOS 52 07/17/2014 1043   ALKPHOS 49 11/20/2013 0542   ALKPHOS 45 09/11/2009 1024   AST 14 07/17/2014 1043   AST 14 11/20/2013 0542   AST 21 09/11/2009 1024   ALT 14 07/17/2014 1043   ALT 10 11/20/2013 0542   ALT 23 09/11/2009 1024   BILITOT 0.23 07/17/2014 1043   BILITOT 0.2* 11/20/2013 0542   BILITOT 0.50 09/11/2009 1024       Lab Results  Component Value Date   WBC 6.1 07/17/2014   HGB 10.9* 07/17/2014   HCT 34.1* 07/17/2014   MCV 101.8* 07/17/2014   PLT 299 07/17/2014   NEUTROABS 3.7 07/17/2014    ASSESSMENT & PLAN:  Essential thrombocytosis Essential thrombocytosis with JAK2 mutation: Continue Hydrea 500 mg daily and also aspirin 325 mg by mouth once daily. She is tolerating it quite well. Her plt count is 316 today. She is tolerating the medication well.   Hydrea toxicities: 1. Fatigue  2. Hair loss  3.brittle nails   I reviewed her blood work which looks pretty stable with mild anemia. We discussed different options and decided to decrease Hydrea to Monday Wednesday Friday. If she tolerates this better and her counts remain stable we will further decrease it to once or twice a week and then plan on stopping it slowly over time if her platelets do not get worse.   Return to clinic in 3 months for follow-up     Orders Placed This Encounter  Procedures  . CBC with Differential     Standing Status: Future     Number of Occurrences:      Standing Expiration Date: 07/17/2015  . Comprehensive metabolic panel (Cmet) - [facility]    Standing Status: Future     Number of Occurrences:      Standing Expiration Date: 07/17/2015   The patient has a good understanding of the overall plan. she agrees with it. she will call with any problems that may develop before the next visit here.   Rulon Eisenmenger, MD   '

## 2014-07-31 ENCOUNTER — Other Ambulatory Visit: Payer: Self-pay | Admitting: Hematology and Oncology

## 2014-08-28 ENCOUNTER — Other Ambulatory Visit: Payer: Self-pay | Admitting: Hematology and Oncology

## 2014-08-28 DIAGNOSIS — D473 Essential (hemorrhagic) thrombocythemia: Secondary | ICD-10-CM

## 2014-10-17 ENCOUNTER — Encounter: Payer: Self-pay | Admitting: Hematology and Oncology

## 2014-10-17 ENCOUNTER — Ambulatory Visit (HOSPITAL_BASED_OUTPATIENT_CLINIC_OR_DEPARTMENT_OTHER): Payer: Medicare Other | Admitting: Hematology and Oncology

## 2014-10-17 ENCOUNTER — Other Ambulatory Visit (HOSPITAL_BASED_OUTPATIENT_CLINIC_OR_DEPARTMENT_OTHER): Payer: Medicare Other

## 2014-10-17 ENCOUNTER — Telehealth: Payer: Self-pay | Admitting: Hematology and Oncology

## 2014-10-17 VITALS — BP 135/68 | HR 89 | Temp 99.0°F | Resp 18 | Ht 69.0 in | Wt 160.3 lb

## 2014-10-17 DIAGNOSIS — F329 Major depressive disorder, single episode, unspecified: Secondary | ICD-10-CM | POA: Diagnosis not present

## 2014-10-17 DIAGNOSIS — R42 Dizziness and giddiness: Secondary | ICD-10-CM

## 2014-10-17 DIAGNOSIS — D473 Essential (hemorrhagic) thrombocythemia: Secondary | ICD-10-CM

## 2014-10-17 DIAGNOSIS — R5383 Other fatigue: Secondary | ICD-10-CM | POA: Diagnosis not present

## 2014-10-17 LAB — COMPREHENSIVE METABOLIC PANEL (CC13)
ALT: 13 U/L (ref 0–55)
AST: 16 U/L (ref 5–34)
Albumin: 4 g/dL (ref 3.5–5.0)
Alkaline Phosphatase: 51 U/L (ref 40–150)
Anion Gap: 6 mEq/L (ref 3–11)
BILIRUBIN TOTAL: 0.28 mg/dL (ref 0.20–1.20)
BUN: 17.7 mg/dL (ref 7.0–26.0)
CALCIUM: 9.1 mg/dL (ref 8.4–10.4)
CHLORIDE: 107 meq/L (ref 98–109)
CO2: 28 meq/L (ref 22–29)
CREATININE: 0.9 mg/dL (ref 0.6–1.1)
EGFR: 62 mL/min/{1.73_m2} — ABNORMAL LOW (ref >90)
Glucose: 74 mg/dl (ref 70–140)
Potassium: 4.3 mEq/L (ref 3.5–5.1)
SODIUM: 141 meq/L (ref 136–145)
TOTAL PROTEIN: 6.8 g/dL (ref 6.4–8.3)

## 2014-10-17 LAB — CBC WITH DIFFERENTIAL/PLATELET
BASO%: 1.4 % (ref 0.0–2.0)
BASOS ABS: 0.1 10*3/uL (ref 0.0–0.1)
EOS ABS: 1.2 10*3/uL — AB (ref 0.0–0.5)
EOS%: 16.3 % — ABNORMAL HIGH (ref 0.0–7.0)
HCT: 34.9 % (ref 34.8–46.6)
HGB: 11.3 g/dL — ABNORMAL LOW (ref 11.6–15.9)
LYMPH#: 1 10*3/uL (ref 0.9–3.3)
LYMPH%: 13.3 % — ABNORMAL LOW (ref 14.0–49.7)
MCH: 30.7 pg (ref 25.1–34.0)
MCHC: 32.4 g/dL (ref 31.5–36.0)
MCV: 94.6 fL (ref 79.5–101.0)
MONO#: 0.5 10*3/uL (ref 0.1–0.9)
MONO%: 7.3 % (ref 0.0–14.0)
NEUT#: 4.5 10*3/uL (ref 1.5–6.5)
NEUT%: 61.7 % (ref 38.4–76.8)
Platelets: 383 10*3/uL (ref 145–400)
RBC: 3.69 10*6/uL — ABNORMAL LOW (ref 3.70–5.45)
RDW: 13 % (ref 11.2–14.5)
WBC: 7.3 10*3/uL (ref 3.9–10.3)

## 2014-10-17 NOTE — Assessment & Plan Note (Signed)
Essential thrombocytosis with JAK2 mutation: Continue Hydrea 500 mg daily and also aspirin 325 mg by mouth once daily. She is tolerating it quite well. Her plt count is 316 today. She is tolerating the medication well.Reduced the dosage of Hydrea 500 mg 3 times a week since 07/17/2014  Hydrea toxicities: 1. Fatigue  2. Hair loss  3.brittle nails   I service her counts remained stable, we will further decrease it to twice a week and then plan on stopping it slowly over time if her platelets do not get worse.   Return to clinic in 3 months for follow-up

## 2014-10-17 NOTE — Progress Notes (Signed)
Patient Care Team: Hulan Fess, MD as PCP - General  DIAGNOSIS: Essential thrombocytosis on hydroxyurea.  CHIEF COMPLIANT: Fatigue, dizziness, depression  INTERVAL HISTORY: Deanna Hill is a 74 year old with above-mentioned history of essential thrombocytosis was been on long-standing hydroxyurea. Since I've seen her our goal was to taper it down and discontinued. To that regard I decreased the dosage to 3 times a week. She is here for blood count check and her platelet count appears to be stable. She does not have any symptoms related to essential thrombocytosis. She does have some medical issues related to fatigue and dizziness and depression. None of these are related to hydroxyurea.  REVIEW OF SYSTEMS:   Constitutional: Denies fevers, chills or abnormal weight loss, fatigue Eyes: Denies blurriness of vision Ears, nose, mouth, throat, and face: Denies mucositis or sore throat Respiratory: Denies cough, dyspnea or wheezes Cardiovascular: Denies palpitation, chest discomfort or lower extremity swelling Gastrointestinal:  Denies nausea, heartburn or change in bowel habits Skin: Denies abnormal skin rashes Lymphatics: Denies new lymphadenopathy or easy bruising Neurological:Denies numbness, tingling or new weaknesses Behavioral/Psych: Depression  All other systems were reviewed with the patient and are negative.  I have reviewed the past medical history, past surgical history, social history and family history with the patient and they are unchanged from previous note.  ALLERGIES:  is allergic to iodine and shellfish allergy.  MEDICATIONS:  Current Outpatient Prescriptions  Medication Sig Dispense Refill  . acetaminophen (TYLENOL) 325 MG tablet Take 650 mg by mouth daily as needed for mild pain.    [name] ALPRAZolam (XANAX) 0.25 MG tablet Take 0.125 mg by mouth at bedtime as needed for anxiety.    [name] aspirin 325 MG tablet Take 325 mg by mouth daily.     [name] BIOTIN PO Take 1 capsule by  mouth daily.    . calcium carbonate (TUMS - DOSED IN MG ELEMENTAL CALCIUM) 500 MG chewable tablet Chew 2 tablets by mouth daily as needed for indigestion or heartburn.    . Cholecalciferol (VITAMIN D) 2000 UNITS CAPS Take 1 capsule by mouth daily.    . Evening Primrose Oil 500 MG CAPS Take 1 capsule by mouth 2 (two) times daily.    . hydroxyurea (HYDREA) 500 MG capsule TAKE 1 CAPSULE (500 MG TOTAL) BY MOUTH AS DIRECTED. TAKE MONDAY WEDNESDAY AND FRIDAY. 42 capsule 0  . levothyroxine (SYNTHROID, LEVOTHROID) 100 MCG tablet Take 100 mcg by mouth daily.     . metroNIDAZOLE (METROCREAM) 0.75 % cream Apply 1 application topically daily as needed (for rosacea).    . Multiple Vitamin (MULTIVITAMIN) tablet Take 1 tablet by mouth daily.     . nitroGLYCERIN (NITROSTAT) 0.4 MG SL tablet Place 0.4 mg under the tongue every 5 (five) minutes as needed for chest pain.    . ranitidine (ZANTAC) 150 MG capsule Take 150 mg by mouth 2 (two) times daily.      No current facility-administered medications for this visit.    PHYSICAL EXAMINATION: ECOG PERFORMANCE STATUS: 1 - Symptomatic but completely ambulatory  Filed Vitals:   10/17/14 1106  BP: 135/68  Pulse: 89  Temp: 99 F (37.2 C)  Resp: 18   Filed Weights   10/17/14 1106  Weight: 160 lb 4.8 oz (72.712 kg)    GENERAL:alert, no distress and comfortable SKIN: skin color, texture, turgor are normal, no rashes or significant lesions EYES: normal, Conjunctiva are pink and non-injected, sclera clear OROPHARYNX:no exudate, no erythema and lips, buccal mucosa, and tongue normal  NECK: supple, thyroid normal size, non-tender, without nodularity LYMPH:  no palpable lymphadenopathy in the cervical, axillary or inguinal LUNGS: clear to auscultation and percussion with normal breathing effort HEART: regular rate & rhythm and no murmurs and no lower extremity edema ABDOMEN:abdomen soft, non-tender and normal bowel sounds Musculoskeletal:no cyanosis of digits  and no clubbing  NEURO: alert & oriented x 3 with fluent speech, no focal motor/sensory deficits   LABORATORY DATA:  I have reviewed the data as listed   Chemistry      Component Value Date/Time   NA 141 10/17/2014 1047   NA 144 11/20/2013 0542   NA 137 09/11/2009 1024   K 4.3 10/17/2014 1047   K 4.1 11/20/2013 0542   K 4.4 09/11/2009 1024   CL 106 11/20/2013 0542   CL 107 06/24/2012 0859   CL 100 09/11/2009 1024   CO2 28 10/17/2014 1047   CO2 25 11/20/2013 0542   CO2 30 09/11/2009 1024   BUN 17.7 10/17/2014 1047   BUN 24* 11/20/2013 0542   BUN 19 09/11/2009 1024   CREATININE 0.9 10/17/2014 1047   CREATININE 0.92 11/20/2013 0542   CREATININE 0.9 09/11/2009 1024      Component Value Date/Time   CALCIUM 9.1 10/17/2014 1047   CALCIUM 9.7 11/20/2013 0542   CALCIUM 9.0 09/11/2009 1024   ALKPHOS 51 10/17/2014 1047   ALKPHOS 49 11/20/2013 0542   ALKPHOS 45 09/11/2009 1024   AST 16 10/17/2014 1047   AST 14 11/20/2013 0542   AST 21 09/11/2009 1024   ALT 13 10/17/2014 1047   ALT 10 11/20/2013 0542   ALT 23 09/11/2009 1024   BILITOT 0.28 10/17/2014 1047   BILITOT 0.2* 11/20/2013 0542   BILITOT 0.50 09/11/2009 1024       Lab Results  Component Value Date   WBC 7.3 10/17/2014   HGB 11.3* 10/17/2014   HCT 34.9 10/17/2014   MCV 94.6 10/17/2014   PLT 383 10/17/2014   NEUTROABS 4.5 10/17/2014    ASSESSMENT & PLAN:  Essential thrombocytosis Essential thrombocytosis with JAK2 mutation: Continue Hydrea 500 mg daily and also aspirin 325 mg by mouth once daily. She is tolerating it quite well. Her plt count is 316 today. She is tolerating the medication well.Reduced the dosage of Hydrea 500 mg 3 times a week since 07/17/2014, reducing the dosage of Hydrea to 500 mg twice a week from 10/17/2014  Hydrea toxicities: 1. Fatigue  2. Hair loss  3.brittle nails   Patient is complaining of the following: 1. Fatigue 2. Occasional dizziness 3. Depression I discussed with  her extensively that none of these symptoms are related to Hydrea. I asked that she speak with her primary care physician regarding these health issues.   I reviewed her counts  and they remained stable, we will further decrease it to twice a week today with the plan of reducing it further to once a day and then finally discontinuing it.   Return to clinic in 3 months for follow-upwith labs.     No orders of the defined types were placed in this encounter.   The patient has a good understanding of the overall plan. she agrees with it. she will call with any problems that may develop before the next visit here.   Rulon Eisenmenger, MD

## 2014-10-17 NOTE — Telephone Encounter (Signed)
Appointments made and avs printed for patient °

## 2015-01-18 ENCOUNTER — Telehealth: Payer: Self-pay | Admitting: Hematology and Oncology

## 2015-01-18 ENCOUNTER — Other Ambulatory Visit (HOSPITAL_BASED_OUTPATIENT_CLINIC_OR_DEPARTMENT_OTHER): Payer: Medicare Other

## 2015-01-18 ENCOUNTER — Ambulatory Visit (HOSPITAL_BASED_OUTPATIENT_CLINIC_OR_DEPARTMENT_OTHER): Payer: Medicare Other | Admitting: Hematology and Oncology

## 2015-01-18 ENCOUNTER — Encounter: Payer: Self-pay | Admitting: Hematology and Oncology

## 2015-01-18 VITALS — BP 145/67 | HR 89 | Temp 98.4°F | Resp 18 | Ht 69.0 in | Wt 160.7 lb

## 2015-01-18 DIAGNOSIS — R5383 Other fatigue: Secondary | ICD-10-CM | POA: Diagnosis not present

## 2015-01-18 DIAGNOSIS — D473 Essential (hemorrhagic) thrombocythemia: Secondary | ICD-10-CM

## 2015-01-18 DIAGNOSIS — L603 Nail dystrophy: Secondary | ICD-10-CM | POA: Diagnosis not present

## 2015-01-18 DIAGNOSIS — D649 Anemia, unspecified: Secondary | ICD-10-CM

## 2015-01-18 DIAGNOSIS — L659 Nonscarring hair loss, unspecified: Secondary | ICD-10-CM

## 2015-01-18 LAB — COMPREHENSIVE METABOLIC PANEL (CC13)
ALBUMIN: 4.1 g/dL (ref 3.5–5.0)
ALT: 12 U/L (ref 0–55)
AST: 16 U/L (ref 5–34)
Alkaline Phosphatase: 51 U/L (ref 40–150)
Anion Gap: 9 mEq/L (ref 3–11)
BUN: 19.8 mg/dL (ref 7.0–26.0)
CHLORIDE: 108 meq/L (ref 98–109)
CO2: 24 meq/L (ref 22–29)
CREATININE: 1 mg/dL (ref 0.6–1.1)
Calcium: 9.4 mg/dL (ref 8.4–10.4)
EGFR: 58 mL/min/{1.73_m2} — AB (ref >90)
Glucose: 80 mg/dl (ref 70–140)
POTASSIUM: 4.5 meq/L (ref 3.5–5.1)
Sodium: 142 mEq/L (ref 136–145)
Total Bilirubin: 0.34 mg/dL (ref 0.20–1.20)
Total Protein: 6.9 g/dL (ref 6.4–8.3)

## 2015-01-18 LAB — CBC WITH DIFFERENTIAL/PLATELET
BASO%: 1.4 % (ref 0.0–2.0)
Basophils Absolute: 0.1 10*3/uL (ref 0.0–0.1)
EOS ABS: 1.2 10*3/uL — AB (ref 0.0–0.5)
EOS%: 13.6 % — ABNORMAL HIGH (ref 0.0–7.0)
HCT: 34 % — ABNORMAL LOW (ref 34.8–46.6)
HEMOGLOBIN: 10.9 g/dL — AB (ref 11.6–15.9)
LYMPH%: 11.2 % — ABNORMAL LOW (ref 14.0–49.7)
MCH: 28.2 pg (ref 25.1–34.0)
MCHC: 32 g/dL (ref 31.5–36.0)
MCV: 88.2 fL (ref 79.5–101.0)
MONO#: 0.6 10*3/uL (ref 0.1–0.9)
MONO%: 7.4 % (ref 0.0–14.0)
NEUT#: 5.7 10*3/uL (ref 1.5–6.5)
NEUT%: 66.4 % (ref 38.4–76.8)
Platelets: 472 10*3/uL — ABNORMAL HIGH (ref 145–400)
RBC: 3.85 10*6/uL (ref 3.70–5.45)
RDW: 14.7 % — AB (ref 11.2–14.5)
WBC: 8.6 10*3/uL (ref 3.9–10.3)
lymph#: 1 10*3/uL (ref 0.9–3.3)

## 2015-01-18 MED ORDER — HYDROXYUREA 500 MG PO CAPS: ORAL_CAPSULE | ORAL | Status: DC

## 2015-01-18 NOTE — Addendum Note (Signed)
Addended by: Prentiss Bells on: 01/18/2015 06:02 PM   Modules accepted: Medications

## 2015-01-18 NOTE — Assessment & Plan Note (Signed)
Essential thrombocytosis with JAK2 mutation: Continue Hydrea 500 mg daily and also aspirin 325 mg by mouth once daily. She is tolerating it quite well. Her plt count is 316 today. She is tolerating the medication well.Reduced the dosage of Hydrea 500 mg 3 times a week since 07/17/2014, reducing the dosage of Hydrea to 500 mg twice a week from 10/17/2014  Hydrea toxicities: 1. Fatigue  2. Hair loss  3.brittle nails   Patient is complaining of the following: 1. Fatigue 2. Occasional dizziness 3. Depression I discussed with her extensively that none of these symptoms are related to Hydrea. I asked that she speak with her primary care physician regarding these health issues.  I reviewed her counts and they remained stable, we will further decrease it to once a week and then finally discontinuing it.   Return to clinic in 3 months for follow-upwith labs.

## 2015-01-18 NOTE — Progress Notes (Signed)
Patient Care Team: Hulan Fess, MD as PCP - General  DIAGNOSIS: Essential thrombocytosis on hydroxyurea.  CHIEF COMPLIANT: follow-up on hydroxyurea  INTERVAL HISTORY: Deanna Hill is a 74 year old with above-mentioned history of essential thrombocytosis who is on hydroxyurea. She is tolerating the tablet very well without any major problems or concerns. Decrease hydroxyurea to twice a week and she is here for blood work and follow-up. She reports no new changes other than the fact that her irritable bowel syndrome has been acting up. This has been limiting her agitated activities. She cannot plan to go anywhere because of this. The treatments that they have prescribed for irritable bowel syndrome have not been very successful.  REVIEW OF SYSTEMS:   Constitutional: Denies fevers, chills or abnormal weight loss Eyes: Denies blurriness of vision Ears, nose, mouth, throat, and face: Denies mucositis or sore throat Respiratory: Denies cough, dyspnea or wheezes Cardiovascular: Denies palpitation, chest discomfort or lower extremity swelling Gastrointestinal:  Irritable bowel syndrome Skin: Denies abnormal skin rashes Lymphatics: Denies new lymphadenopathy or easy bruising Neurological:Denies numbness, tingling or new weaknesses Behavioral/Psych: Mood is stable, no new changes  All other systems were reviewed with the patient and are negative.  I have reviewed the past medical history, past surgical history, social history and family history with the patient and they are unchanged from previous note.  ALLERGIES:  is allergic to iodine and shellfish allergy.  MEDICATIONS:  Current Outpatient Prescriptions  Medication Sig Dispense Refill  . acetaminophen (TYLENOL) 325 MG tablet Take 650 mg by mouth daily as needed for mild pain.    [name] ALPRAZolam (XANAX) 0.25 MG tablet Take 0.125 mg by mouth at bedtime as needed for anxiety.    [name] aspirin 325 MG tablet Take 325 mg by mouth daily.     [name]  BIOTIN PO Take 1 capsule by mouth daily.    . calcium carbonate (TUMS - DOSED IN MG ELEMENTAL CALCIUM) 500 MG chewable tablet Chew 2 tablets by mouth daily as needed for indigestion or heartburn.    . Cholecalciferol (VITAMIN D) 2000 UNITS CAPS Take 1 capsule by mouth daily.    . Evening Primrose Oil 500 MG CAPS Take 1 capsule by mouth 2 (two) times daily.    . hydroxyurea (HYDREA) 500 MG capsule TAKE 1 CAPSULE (500 MG TOTAL) BY MOUTH AS DIRECTED. TAKE MONDAY WEDNESDAY AND FRIDAY. 42 capsule 0  . levothyroxine (SYNTHROID, LEVOTHROID) 100 MCG tablet Take 100 mcg by mouth daily.     . metroNIDAZOLE (METROCREAM) 0.75 % cream Apply 1 application topically daily as needed (for rosacea).    . Multiple Vitamin (MULTIVITAMIN) tablet Take 1 tablet by mouth daily.     . nitroGLYCERIN (NITROSTAT) 0.4 MG SL tablet Place 0.4 mg under the tongue every 5 (five) minutes as needed for chest pain.    . ranitidine (ZANTAC) 150 MG capsule Take 150 mg by mouth 2 (two) times daily.      No current facility-administered medications for this visit.    PHYSICAL EXAMINATION: ECOG PERFORMANCE STATUS: 1 - Symptomatic but completely ambulatory  Filed Vitals:   01/18/15 1107  BP: 145/67  Pulse: 89  Temp: 98.4 F (36.9 C)  Resp: 18   Filed Weights   01/18/15 1107  Weight: 160 lb 11.2 oz (72.893 kg)    GENERAL:alert, no distress and comfortable SKIN: skin color, texture, turgor are normal, no rashes or significant lesions EYES: normal, Conjunctiva are pink and non-injected, sclera clear OROPHARYNX:no exudate, no erythema and lips,  buccal mucosa, and tongue normal  NECK: supple, thyroid normal size, non-tender, without nodularity LYMPH:  no palpable lymphadenopathy in the cervical, axillary or inguinal LUNGS: clear to auscultation and percussion with normal breathing effort HEART: regular rate & rhythm and no murmurs and no lower extremity edema ABDOMEN:abdomen soft, non-tender and normal bowel  sounds Musculoskeletal:no cyanosis of digits and no clubbing  NEURO: alert & oriented x 3 with fluent speech, no focal motor/sensory deficits   LABORATORY DATA:  I have reviewed the data as listed   Chemistry      Component Value Date/Time   NA 141 10/17/2014 1047   NA 144 11/20/2013 0542   NA 137 09/11/2009 1024   K 4.3 10/17/2014 1047   K 4.1 11/20/2013 0542   K 4.4 09/11/2009 1024   CL 106 11/20/2013 0542   CL 107 06/24/2012 0859   CL 100 09/11/2009 1024   CO2 28 10/17/2014 1047   CO2 25 11/20/2013 0542   CO2 30 09/11/2009 1024   BUN 17.7 10/17/2014 1047   BUN 24* 11/20/2013 0542   BUN 19 09/11/2009 1024   CREATININE 0.9 10/17/2014 1047   CREATININE 0.92 11/20/2013 0542   CREATININE 0.9 09/11/2009 1024      Component Value Date/Time   CALCIUM 9.1 10/17/2014 1047   CALCIUM 9.7 11/20/2013 0542   CALCIUM 9.0 09/11/2009 1024   ALKPHOS 51 10/17/2014 1047   ALKPHOS 49 11/20/2013 0542   ALKPHOS 45 09/11/2009 1024   AST 16 10/17/2014 1047   AST 14 11/20/2013 0542   AST 21 09/11/2009 1024   ALT 13 10/17/2014 1047   ALT 10 11/20/2013 0542   ALT 23 09/11/2009 1024   BILITOT 0.28 10/17/2014 1047   BILITOT 0.2* 11/20/2013 0542   BILITOT 0.50 09/11/2009 1024       Lab Results  Component Value Date   WBC 8.6 01/18/2015   HGB 10.9* 01/18/2015   HCT 34.0* 01/18/2015   MCV 88.2 01/18/2015   PLT 472* 01/18/2015   NEUTROABS 5.7 01/18/2015   ASSESSMENT & PLAN:  Essential thrombocytosis Essential thrombocytosis with JAK2 mutation: Continue Hydrea 500 mg daily and also aspirin 325 mg by mouth once daily. She is tolerating it quite well. Her plt count is 316 today. She is tolerating the medication well.Reduced the dosage of Hydrea 500 mg 3 times a week since 07/17/2014, reducing the dosage of Hydrea to 500 mg twice a week from 08/03/2016and back up to 3 times a week starting 01/18/2015  Hydrea toxicities: 1. Fatigue  2. Hair loss  3.brittle nails   Patient is also  having issues with irritable bowel syndrome which is limiting her day-to-day activities.  I reviewed her counts and the platelet count is increasing to 472 today. I've increased her hydroxyurea to 3 times a week.   Return to clinic in 3 months for follow-upwith labs.    No orders of the defined types were placed in this encounter.   The patient has a good understanding of the overall plan. she agrees with it. she will call with any problems that may develop before the next visit here.   Rulon Eisenmenger, MD 01/18/2015

## 2015-01-18 NOTE — Telephone Encounter (Signed)
Gave adn prnted appt sched and avs for pt for Feb 2017

## 2015-04-18 NOTE — Assessment & Plan Note (Signed)
Essential thrombocytosis with JAK2 mutation: Continue Hydrea 500 mg daily and also aspirin 325 mg by mouth once daily. She is tolerating it quite well. Her plt count is 316 today. She is tolerating the medication well.Reduced the dosage of Hydrea 500 mg 3 times a week since 07/17/2014, reducing the dosage of Hydrea to 500 mg twice a week from 08/03/2016and back up to 3 times a week starting 01/18/2015  Hydrea toxicities: 1. Fatigue  2. Hair loss  3.brittle nails   Patient is also having issues with irritable bowel syndrome which is limiting her day-to-day activities.  I reviewed her counts and the platelet count is increasing to 472 today. I've increased her hydroxyurea to 3 times a week.   Return to clinic in 6 months for follow-upwith labs.

## 2015-04-19 ENCOUNTER — Ambulatory Visit (HOSPITAL_BASED_OUTPATIENT_CLINIC_OR_DEPARTMENT_OTHER): Payer: Medicare Other | Admitting: Hematology and Oncology

## 2015-04-19 ENCOUNTER — Other Ambulatory Visit (HOSPITAL_BASED_OUTPATIENT_CLINIC_OR_DEPARTMENT_OTHER): Payer: Medicare Other

## 2015-04-19 ENCOUNTER — Telehealth: Payer: Self-pay | Admitting: Hematology and Oncology

## 2015-04-19 VITALS — BP 134/59 | HR 83 | Temp 97.4°F | Resp 18 | Ht 69.0 in | Wt 159.9 lb

## 2015-04-19 DIAGNOSIS — R5383 Other fatigue: Secondary | ICD-10-CM

## 2015-04-19 DIAGNOSIS — D473 Essential (hemorrhagic) thrombocythemia: Secondary | ICD-10-CM

## 2015-04-19 DIAGNOSIS — T451X5A Adverse effect of antineoplastic and immunosuppressive drugs, initial encounter: Secondary | ICD-10-CM

## 2015-04-19 DIAGNOSIS — L659 Nonscarring hair loss, unspecified: Secondary | ICD-10-CM

## 2015-04-19 DIAGNOSIS — D649 Anemia, unspecified: Secondary | ICD-10-CM

## 2015-04-19 DIAGNOSIS — D6481 Anemia due to antineoplastic chemotherapy: Secondary | ICD-10-CM

## 2015-04-19 DIAGNOSIS — K580 Irritable bowel syndrome with diarrhea: Secondary | ICD-10-CM | POA: Diagnosis not present

## 2015-04-19 DIAGNOSIS — L603 Nail dystrophy: Secondary | ICD-10-CM

## 2015-04-19 LAB — CBC WITH DIFFERENTIAL/PLATELET
BASO%: 1.3 % (ref 0.0–2.0)
Basophils Absolute: 0.1 10*3/uL (ref 0.0–0.1)
EOS ABS: 1.2 10*3/uL — AB (ref 0.0–0.5)
EOS%: 14.6 % — ABNORMAL HIGH (ref 0.0–7.0)
HEMATOCRIT: 33.1 % — AB (ref 34.8–46.6)
HGB: 10.2 g/dL — ABNORMAL LOW (ref 11.6–15.9)
LYMPH#: 0.8 10*3/uL — AB (ref 0.9–3.3)
LYMPH%: 9.9 % — AB (ref 14.0–49.7)
MCH: 26.3 pg (ref 25.1–34.0)
MCHC: 31 g/dL — AB (ref 31.5–36.0)
MCV: 84.9 fL (ref 79.5–101.0)
MONO#: 0.7 10*3/uL (ref 0.1–0.9)
MONO%: 8 % (ref 0.0–14.0)
NEUT#: 5.4 10*3/uL (ref 1.5–6.5)
NEUT%: 66.2 % (ref 38.4–76.8)
PLATELETS: 411 10*3/uL — AB (ref 145–400)
RBC: 3.89 10*6/uL (ref 3.70–5.45)
RDW: 15.8 % — ABNORMAL HIGH (ref 11.2–14.5)
WBC: 8.2 10*3/uL (ref 3.9–10.3)

## 2015-04-19 LAB — COMPREHENSIVE METABOLIC PANEL
ALT: 12 U/L (ref 0–55)
ANION GAP: 9 meq/L (ref 3–11)
AST: 14 U/L (ref 5–34)
Albumin: 3.9 g/dL (ref 3.5–5.0)
Alkaline Phosphatase: 50 U/L (ref 40–150)
BUN: 21.1 mg/dL (ref 7.0–26.0)
CALCIUM: 9.1 mg/dL (ref 8.4–10.4)
CHLORIDE: 106 meq/L (ref 98–109)
CO2: 25 mEq/L (ref 22–29)
CREATININE: 0.9 mg/dL (ref 0.6–1.1)
EGFR: 61 mL/min/{1.73_m2} — AB (ref >90)
Glucose: 94 mg/dl (ref 70–140)
Potassium: 4.2 mEq/L (ref 3.5–5.1)
Sodium: 140 mEq/L (ref 136–145)
TOTAL PROTEIN: 6.9 g/dL (ref 6.4–8.3)

## 2015-04-19 NOTE — Telephone Encounter (Signed)
Appointments made and avs printed °

## 2015-04-19 NOTE — Progress Notes (Signed)
Patient Care Team: Hulan Fess, MD as PCP - General  DIAGNOSIS: Essential thrombocytosis and hydroxyurea 500 mg twice a week  CHIEF COMPLIANT: fatigue, mild dizziness/vertigo  INTERVAL HISTORY: Deanna Hill is a 75 year old with above-mentioned history of essential thrombocytosis who was on hydroxyurea. I readjusted her hydroxyurea dose to 3 times a week. She is here today for a three-month follow-up and it appears that on today's blood work she is more anemic with a hemoglobin of 10.2. She does complain of more fatigue than previously. She has benign positional vertigo and it appears that the vertigo is also acting up.she is also concerned with her irritable bowel syndrome which is acting up. She continues to have diarrhea from it.  REVIEW OF SYSTEMS:   Constitutional: Denies fevers, chills or abnormal weight loss, complains of fatigue Eyes: Denies blurriness of vision Ears, nose, mouth, throat, and face: Denies mucositis or sore throat Respiratory: Denies cough, dyspnea or wheezes Cardiovascular: Denies palpitation, chest discomfort Gastrointestinal:  Diarrhea due to irritable bowel syndrome Skin: Denies abnormal skin rashes Lymphatics: Denies new lymphadenopathy or easy bruising Neurological:Denies numbness, tingling or new weaknesses Behavioral/Psych: Mood is stable, no new changes  Extremities: No lower extremity edema  All other systems were reviewed with the patient and are negative.  I have reviewed the past medical history, past surgical history, social history and family history with the patient and they are unchanged from previous note.  ALLERGIES:  is allergic to iodine and shellfish allergy.  MEDICATIONS:  Current Outpatient Prescriptions  Medication Sig Dispense Refill  . acetaminophen (TYLENOL) 325 MG tablet Take 650 mg by mouth daily as needed for mild pain.    [name] ALPRAZolam (XANAX) 0.25 MG tablet Take 0.125 mg by mouth at bedtime as needed for anxiety.    [name]  aspirin 325 MG tablet Take 325 mg by mouth daily.     [name] BIOTIN PO Take 1 capsule by mouth daily.    . calcium carbonate (TUMS - DOSED IN MG ELEMENTAL CALCIUM) 500 MG chewable tablet Chew 2 tablets by mouth daily as needed for indigestion or heartburn.    . Cholecalciferol (VITAMIN D) 2000 UNITS CAPS Take 1 capsule by mouth daily.    . Evening Primrose Oil 500 MG CAPS Take 1 capsule by mouth 2 (two) times daily.    . hydroxyurea (HYDREA) 500 MG capsule TAKE 1 CAPSULE (500 MG TOTAL) BY MOUTH AS DIRECTED. TAKE MONDAY WEDNESDAY AND FRIDAY. 48 capsule 3  . levothyroxine (SYNTHROID, LEVOTHROID) 100 MCG tablet Take 100 mcg by mouth daily.     . metroNIDAZOLE (METROCREAM) 0.75 % cream Apply 1 application topically daily as needed (for rosacea).    . Multiple Vitamin (MULTIVITAMIN) tablet Take 1 tablet by mouth daily.     . nitroGLYCERIN (NITROSTAT) 0.4 MG SL tablet Place 0.4 mg under the tongue every 5 (five) minutes as needed for chest pain.    . ranitidine (ZANTAC) 150 MG capsule Take 150 mg by mouth 2 (two) times daily.      No current facility-administered medications for this visit.    PHYSICAL EXAMINATION: ECOG PERFORMANCE STATUS: 1 - Symptomatic but completely ambulatory  Filed Vitals:   04/19/15 1121  BP: 134/59  Pulse: 83  Temp: 97.4 F (36.3 C)  Resp: 18   Filed Weights   04/19/15 1121  Weight: 159 lb 14.4 oz (72.53 kg)    GENERAL:alert, no distress and comfortable SKIN: skin color, texture, turgor are normal, no rashes or significant lesions EYES: normal,  Conjunctiva are pink and non-injected, sclera clear OROPHARYNX:no exudate, no erythema and lips, buccal mucosa, and tongue normal  NECK: supple, thyroid normal size, non-tender, without nodularity LYMPH:  no palpable lymphadenopathy in the cervical, axillary or inguinal LUNGS: clear to auscultation and percussion with normal breathing effort HEART: regular rate & rhythm and no murmurs and no lower extremity  edema ABDOMEN:abdomen soft, non-tender and normal bowel sounds, no hepatosplenomegaly MUSCULOSKELETAL:no cyanosis of digits and no clubbing  NEURO: alert & oriented x 3 with fluent speech, no focal motor/sensory deficits EXTREMITIES: No lower extremity edema  LABORATORY DATA:  I have reviewed the data as listed   Chemistry      Component Value Date/Time   NA 142 01/18/2015 1054   NA 144 11/20/2013 0542   NA 137 09/11/2009 1024   K 4.5 01/18/2015 1054   K 4.1 11/20/2013 0542   K 4.4 09/11/2009 1024   CL 106 11/20/2013 0542   CL 107 06/24/2012 0859   CL 100 09/11/2009 1024   CO2 24 01/18/2015 1054   CO2 25 11/20/2013 0542   CO2 30 09/11/2009 1024   BUN 19.8 01/18/2015 1054   BUN 24* 11/20/2013 0542   BUN 19 09/11/2009 1024   CREATININE 1.0 01/18/2015 1054   CREATININE 0.92 11/20/2013 0542   CREATININE 0.9 09/11/2009 1024      Component Value Date/Time   CALCIUM 9.4 01/18/2015 1054   CALCIUM 9.7 11/20/2013 0542   CALCIUM 9.0 09/11/2009 1024   ALKPHOS 51 01/18/2015 1054   ALKPHOS 49 11/20/2013 0542   ALKPHOS 45 09/11/2009 1024   AST 16 01/18/2015 1054   AST 14 11/20/2013 0542   AST 21 09/11/2009 1024   ALT 12 01/18/2015 1054   ALT 10 11/20/2013 0542   ALT 23 09/11/2009 1024   BILITOT 0.34 01/18/2015 1054   BILITOT 0.2* 11/20/2013 0542   BILITOT 0.50 09/11/2009 1024       Lab Results  Component Value Date   WBC 8.2 04/19/2015   HGB 10.2* 04/19/2015   HCT 33.1* 04/19/2015   MCV 84.9 04/19/2015   PLT 411* 04/19/2015   NEUTROABS 5.4 04/19/2015     ASSESSMENT & PLAN:  Essential thrombocytosis Essential thrombocytosis with JAK2 mutation: Continue Hydrea 500 mg daily and also aspirin 325 mg by mouth once daily. She is tolerating it quite well. Her plt count is 316 today. She is tolerating the medication well.Reduced the dosage of Hydrea 500 mg 3 times a week since 07/17/2014, reducing the dosage of Hydrea to 500 mg twice a week from 08/03/2016and back up to 3  times a week starting 01/18/2015, reduced to twice a week on 04/19/2015 ( due to decrease in hemoglobin)  Hydrea toxicities: 1. Fatigue  2. Hair loss  3. brittle nails   Irritable bowel syndrome with diarrhea: Patient is also having issues with irritable bowel syndrome which is limiting her day-to-day activities.  I reviewed her counts and the platelet count is increasing to 411 (was 472). However her hemoglobin has also declined to 10.2. I suspect this is related to hydroxyurea therapy. I recommended reducing the dosage of hydroxyurea to twice a week  Normocytic anemia: Hemoglobin is 7.2 today. I suspect this is related to hydroxyurea. We decreased the dosage and we will monitor her hemoglobin.  Return to clinic in 3 months for follow-upwith labs.    No orders of the defined types were placed in this encounter.   The patient has a good understanding of the overall plan. she  agrees with it. she will call with any problems that may develop before the next visit here.   Rulon Eisenmenger, MD 04/19/2015

## 2015-05-14 ENCOUNTER — Encounter: Payer: Self-pay | Admitting: Podiatry

## 2015-05-14 ENCOUNTER — Ambulatory Visit (INDEPENDENT_AMBULATORY_CARE_PROVIDER_SITE_OTHER): Payer: Medicare Other | Admitting: Podiatry

## 2015-05-14 VITALS — BP 151/81 | HR 85 | Resp 16 | Ht 69.0 in | Wt 160.0 lb

## 2015-05-14 DIAGNOSIS — L603 Nail dystrophy: Secondary | ICD-10-CM

## 2015-05-14 NOTE — Progress Notes (Signed)
   Subjective:    Patient ID: Deanna Hill, female    DOB: [birth year], 75 y.o.   MRN: [id number]  HPI: She presents today with a 6 month history of pain to her hallux nail plates bilaterally left greater than right. She states that they have become thickened and discolored and brittle. She denies any trauma denies any discoloration to her other nails. She states that she currently takes medication for essential thrombocytosis. She is done nothing to try to help with discoloration of the nails.    Review of Systems  All other systems reviewed and are negative.      Objective:   Physical Exam: I have reviewed her past medical history medications allergies surgery social history and review of systems. Pulses are palpable bilateral capillary fill time is somewhat sluggish and she does have cyanosis to the distal aspect of the hallux bilaterally associated with probable Raynaud's phenomenon. Neurologic sensorium is intact. Deep tendon reflexes are intact. Muscle strength is normal bilateral. Orthopedic evaluation and x-rays all joints distal to the ankle level for range of motion without crepitation. Cutaneous evaluation of a straight supple well-hydrated cutis no erythema edema cellulitis drainage or odor. Mild cyanosis and no skin breakdown to the hallux bilateral. The nail plate does demonstrate discoloration probable onychomycosis I was able to debride the nails back today in grind them smooth which made her feel much better. There were no longer digging into her skin. I was then able to send the nail clippings and skin clippings for pathologic evaluation.        Assessment & Plan:  Assessment: Nail dystrophy hallux bilateral Raynaud's phenomenon hallux bilateral  Plan: We will notify her once my college he has come back.

## 2015-06-05 ENCOUNTER — Telehealth: Payer: Self-pay | Admitting: *Deleted

## 2015-06-05 NOTE — Telephone Encounter (Signed)
Dr. Milinda Pointer reviewed 05/14/2015 fungal cultures as +, and request pt to make an appt to discuss results and treatment.  Orders to pt and transferred to schedulers.

## 2015-06-11 ENCOUNTER — Ambulatory Visit (INDEPENDENT_AMBULATORY_CARE_PROVIDER_SITE_OTHER): Payer: Medicare Other | Admitting: Podiatry

## 2015-06-11 DIAGNOSIS — L603 Nail dystrophy: Secondary | ICD-10-CM | POA: Diagnosis not present

## 2015-06-11 MED ORDER — TERBINAFINE HCL 250 MG PO TABS: 250.0000 mg | ORAL_TABLET | Freq: Every day | ORAL | Status: DC

## 2015-06-11 NOTE — Progress Notes (Signed)
She presents today for follow-up of her onychomycosis.  Objective: Laboratory demonstrates onychomycosis.  Assessment: Onychomycosis.  Plan: Started her on Lamisil therapy 250 mg tablets 1 by mouth daily 30 days she is about to have lab work done by her primary doctor and we will await those results. I will follow up with her in 1 month for an another lab work and 90 days of medication.

## 2015-06-19 ENCOUNTER — Ambulatory Visit (INDEPENDENT_AMBULATORY_CARE_PROVIDER_SITE_OTHER): Payer: Medicare Other | Admitting: Neurology

## 2015-06-19 ENCOUNTER — Encounter: Payer: Self-pay | Admitting: Neurology

## 2015-06-19 VITALS — BP 166/81 | HR 89 | Ht 69.0 in | Wt 163.5 lb

## 2015-06-19 DIAGNOSIS — G250 Essential tremor: Secondary | ICD-10-CM | POA: Diagnosis not present

## 2015-06-19 DIAGNOSIS — D649 Anemia, unspecified: Secondary | ICD-10-CM | POA: Diagnosis not present

## 2015-06-19 DIAGNOSIS — D473 Essential (hemorrhagic) thrombocythemia: Secondary | ICD-10-CM

## 2015-06-19 HISTORY — DX: Essential tremor: G25.0

## 2015-06-19 MED ORDER — ALPRAZOLAM 0.25 MG PO TABS: 0.1250 mg | ORAL_TABLET | Freq: Three times a day (TID) | ORAL | Status: DC | PRN

## 2015-06-19 NOTE — Patient Instructions (Signed)
Tremor °A tremor is trembling or shaking that you cannot control. Most tremors affect the hands or arms. Tremors can also affect the head, vocal cords, face, and other parts of the body. There are many types of tremors. Common types include:  °· Essential tremor. These usually occur in people over the age of 40. It may run in families and can happen in otherwise healthy people.   °· Resting tremor. These occur when the muscles are at rest, such as when your hands are resting in your lap. People with Parkinson disease often have resting tremors.   °· Postural tremor. These occur when you try to hold a pose, such as keeping your hands outstretched.   °· Kinetic tremor. These occur during purposeful movement, such as trying to touch a finger to your nose.   °· Task-specific tremor. These may occur when you perform tasks such as handwriting, speaking, or standing.   °· Psychogenic tremor. These dramatically lessen or disappear when you are distracted. They can happen in people of all ages.   °Some types of tremors have no known cause. Tremors can also be a symptom of nervous system problems (neurological disorders) that may occur with aging. Some tremors go away with treatment while others do not.  °HOME CARE INSTRUCTIONS °Watch your tremor for any changes. The following actions may help to lessen any discomfort you are feeling: °· Take medicines only as directed by your health care provider.   °· Limit alcohol intake to no more than 1 drink per day for nonpregnant women and 2 drinks per day for men. One drink equals 12 oz of beer, 5 oz of wine, or 1½ oz of hard liquor. °· Do not use any tobacco products, including cigarettes, chewing tobacco, or electronic cigarettes. If you need help quitting, ask your health care provider.   °· Avoid extreme heat or cold.    °· Limit the amount of caffeine you consume as directed by your health care provider.   °· Try to get 8 hours of sleep each night. °· Find ways to manage your  stress, such as meditation or yoga. °· Keep all follow-up visits as directed by your health care provider. This is important. °SEEK MEDICAL CARE IF: °· You start having a tremor after starting a new medicine. °· You have tremor with other symptoms such as: °¨ Numbness. °¨ Tingling. °¨ Pain. °¨ Weakness. °· Your tremor gets worse. °· Your tremor interferes with your day-to-day life. °  °This information is not intended to replace advice given to you by your health care provider. Make sure you discuss any questions you have with your health care provider. °  °Document Released: 02/20/2002 Document Revised: 03/23/2014 Document Reviewed: 08/28/2013 °Elsevier Interactive Patient Education ©2016 [employer] ° °

## 2015-06-19 NOTE — Progress Notes (Signed)
Reason for visit: Tremor  Referring physician: Dr. Theotis Barrio is a 75 y.o. female  History of present illness:  Ms. Dingess is a 75 year old right-handed white female with a history of an essential tremor that dates back at least 20 years. The patient indicates that her mother also has a similar tremor. The patient was seen through this office on 01/08/2011. At that time, we opted not to initiate treatment for the tremor. The patient indicates that both hands are affected, she is right-handed, and will note some problems with her handwriting at times. The patient has difficulty feeding herself occasionally as well. The patient indicates that there are good days and bad days with the tremor, the tremor may be more prominent when she is upset or if she is cold. The patient has also reports some occasional episodes with dizziness with standing, occasionally she may have some transient soreness of the scalp that may come and go, she reports a pre-existing history of migraine. The patient does have some mild gait instability, she denies any falls. The patient denies any weakness of the extremities. She does report some irritable bowel syndrome issues with alternating diarrhea and constipation. She is sent to this office for an evaluation.  Past Medical History  Diagnosis Date  . Thrombocytosis ([place]) 03/11/2011  . Hypothyroidism   . Hyperlipidemia   . GERD (gastroesophageal reflux disease)   . IBS (irritable bowel syndrome)   . Anxiety   . Essential tremor 06/19/2015    Past Surgical History  Procedure Laterality Date  . Tonsillectomy    . Cataract extraction    . Cholecystectomy, laparoscopic    . Abdominal hysterectomy    . Toe surgery    . Left heart catheterization with coronary angiogram N/A 06/08/2013    Procedure: LEFT HEART CATHETERIZATION WITH CORONARY ANGIOGRAM;  Surgeon: Jacolyn Reedy, MD;  Location[facility] CATH LAB;  Service: Cardiovascular;  Laterality: N/A;     Family History  Problem Relation Age of Onset  . Skin cancer Mother     Social history:  reports that she has never smoked. She has never used smokeless tobacco. She reports that she does not drink alcohol or use illicit drugs.  Medications:  Prior to Admission medications   Medication Sig Start Date End Date Taking? Authorizing Provider  acetaminophen (TYLENOL) 325 MG tablet Take 650 mg by mouth daily as needed for mild pain.   Yes Historical Provider, MD  ALPRAZolam (XANAX) 0.25 MG tablet Take 0.125 mg by mouth at bedtime as needed for anxiety.   Yes Historical Provider, MD  aspirin 325 MG tablet Take 325 mg by mouth daily.    Yes Historical Provider, MD  BIOTIN PO Take 1 capsule by mouth daily.   Yes Historical Provider, MD  calcium carbonate (TUMS - DOSED IN MG ELEMENTAL CALCIUM) 500 MG chewable tablet Chew 2 tablets by mouth daily as needed for indigestion or heartburn.   Yes Historical Provider, MD  Cholecalciferol (VITAMIN D) 2000 UNITS CAPS Take 1 capsule by mouth daily.   Yes Historical Provider, MD  Evening Primrose Oil 500 MG CAPS Take 1 capsule by mouth 2 (two) times daily.   Yes Historical Provider, MD  hydroxyurea (HYDREA) 500 MG capsule TAKE 1 CAPSULE (500 MG TOTAL) BY MOUTH AS DIRECTED. TAKE MONDAY WEDNESDAY AND FRIDAY. Patient taking differently: TAKE 1 CAPSULE (500 MG TOTAL) BY MOUTH AS DIRECTED. TAKE MONDAY AND Thursday.. 01/18/15  Yes Nicholas Lose, MD  levothyroxine (SYNTHROID, [place])  100 MCG tablet Take 100 mcg by mouth daily.    Yes Historical Provider, MD  metroNIDAZOLE (METROCREAM) 0.75 % cream Apply 1 application topically daily as needed (for rosacea).   Yes Historical Provider, MD  Multiple Vitamin (MULTIVITAMIN) tablet Take 1 tablet by mouth daily.    Yes Historical Provider, MD  nitroGLYCERIN (NITROSTAT) 0.4 MG SL tablet Place 0.4 mg under the tongue every 5 (five) minutes as needed for chest pain.   Yes Historical Provider, MD  ranitidine (ZANTAC) 150 MG  capsule Take 150 mg by mouth 2 (two) times daily.    Yes Historical Provider, MD  terbinafine (LAMISIL) 250 MG tablet Take 1 tablet (250 mg total) by mouth daily. Patient not taking: Reported on 06/19/2015 06/11/15   Max T Milinda Pointer, DPM      Allergies  Allergen Reactions  . Iodine Other (See Comments)    Pt states "Deadly"  . Shellfish Allergy Other (See Comments)    " I almost died once"     ROS:  Out of a complete 14 system review of symptoms, the patient complains only of the following symptoms, and all other reviewed systems are negative.  Fatigue Palpitations of the heart Shortness of breath Diarrhea, constipation Anemia Achy muscles Runny nose Headache, dizziness, tremor Depression, anxiety, decreased energy Restless legs  Blood pressure 166/81, pulse 89, height 5\' 9"  (1.753 m), weight 163 lb 8 oz (74.163 kg).  Physical Exam  General: The patient is alert and cooperative at the time of the examination.  Eyes: Pupils are equal, round, and reactive to light. Discs are flat bilaterally.  Neck: The neck is supple, no carotid bruits are noted.  Respiratory: The respiratory examination is clear.  Cardiovascular: The cardiovascular examination reveals a regular rate and rhythm, no obvious murmurs or rubs are noted.  Skin: Extremities are without significant edema.  Neurologic Exam  Mental status: The patient is alert and oriented x 3 at the time of the examination. The patient has apparent normal recent and remote memory, with an apparently normal attention span and concentration ability.  Cranial nerves: Facial symmetry is present. There is good sensation of the face to pinprick and soft touch bilaterally. The strength of the facial muscles and the muscles to head turning and shoulder shrug are normal bilaterally. Speech is well enunciated, no aphasia or dysarthria is noted. Extraocular movements are full. Visual fields are full. The tongue is midline, and the patient has  symmetric elevation of the soft palate. No obvious hearing deficits are noted.  Motor: The motor testing reveals 5 over 5 strength of all 4 extremities. Good symmetric motor tone is noted throughout.  Sensory: Sensory testing is intact to pinprick, soft touch, vibration sensation, and position sense on all 4 extremities. No evidence of extinction is noted.  Coordination: Cerebellar testing reveals good finger-nose-finger and heel-to-shin bilaterally. A mild intention tremor seen with finger-nose-finger bilaterally.  Gait and station: Gait is normal. Tandem gait is normal. Romberg is negative. No drift is seen.  Reflexes: Deep tendon reflexes are symmetric and normal bilaterally. Toes are downgoing bilaterally.   Assessment/Plan:  1. Essential tremor  The patient has had some gradual worsening of her tremor over time. She has had a tremor for about 20 years. The patient still has days where the tremor does not bother her much. We will give her a small prescription for alprazolam to use intermittently if she needs treatment for the tremor. She will follow-up in 6-8 months. She is to contact me  if any issues arise.  Jill Alexanders MD 06/19/2015 6:20 PM  [facility] [address] [place], [place] [address]  Phone [phone] Fax [phone]

## 2015-06-21 ENCOUNTER — Encounter: Payer: Self-pay | Admitting: Podiatry

## 2015-07-09 ENCOUNTER — Ambulatory Visit: Payer: Medicare Other | Admitting: Podiatry

## 2015-07-17 ENCOUNTER — Other Ambulatory Visit (HOSPITAL_BASED_OUTPATIENT_CLINIC_OR_DEPARTMENT_OTHER): Payer: Medicare Other

## 2015-07-17 ENCOUNTER — Encounter: Payer: Self-pay | Admitting: Hematology and Oncology

## 2015-07-17 ENCOUNTER — Ambulatory Visit (HOSPITAL_BASED_OUTPATIENT_CLINIC_OR_DEPARTMENT_OTHER): Payer: Medicare Other | Admitting: Hematology and Oncology

## 2015-07-17 ENCOUNTER — Telehealth: Payer: Self-pay | Admitting: Oncology

## 2015-07-17 VITALS — BP 113/45 | HR 18 | Temp 92.0°F | Resp 18 | Ht 69.0 in | Wt 162.7 lb

## 2015-07-17 DIAGNOSIS — D473 Essential (hemorrhagic) thrombocythemia: Secondary | ICD-10-CM

## 2015-07-17 DIAGNOSIS — L658 Other specified nonscarring hair loss: Secondary | ICD-10-CM | POA: Diagnosis not present

## 2015-07-17 DIAGNOSIS — K580 Irritable bowel syndrome with diarrhea: Secondary | ICD-10-CM

## 2015-07-17 DIAGNOSIS — L603 Nail dystrophy: Secondary | ICD-10-CM

## 2015-07-17 DIAGNOSIS — D649 Anemia, unspecified: Secondary | ICD-10-CM | POA: Diagnosis not present

## 2015-07-17 LAB — CBC WITH DIFFERENTIAL/PLATELET
BASO%: 1.2 % (ref 0.0–2.0)
BASOS ABS: 0.1 10*3/uL (ref 0.0–0.1)
EOS%: 16.5 % — AB (ref 0.0–7.0)
Eosinophils Absolute: 1.5 10*3/uL — ABNORMAL HIGH (ref 0.0–0.5)
HCT: 32.7 % — ABNORMAL LOW (ref 34.8–46.6)
HGB: 9.8 g/dL — ABNORMAL LOW (ref 11.6–15.9)
LYMPH%: 11.6 % — AB (ref 14.0–49.7)
MCH: 25 pg — AB (ref 25.1–34.0)
MCHC: 30 g/dL — AB (ref 31.5–36.0)
MCV: 83.4 fL (ref 79.5–101.0)
MONO#: 0.7 10*3/uL (ref 0.1–0.9)
MONO%: 7.8 % (ref 0.0–14.0)
NEUT#: 5.6 10*3/uL (ref 1.5–6.5)
NEUT%: 62.9 % (ref 38.4–76.8)
Platelets: 455 10*3/uL — ABNORMAL HIGH (ref 145–400)
RBC: 3.92 10*6/uL (ref 3.70–5.45)
RDW: 15.4 % — AB (ref 11.2–14.5)
WBC: 8.9 10*3/uL (ref 3.9–10.3)
lymph#: 1 10*3/uL (ref 0.9–3.3)

## 2015-07-17 NOTE — Telephone Encounter (Signed)
appt made and avs printed °

## 2015-07-17 NOTE — Progress Notes (Signed)
Patient Care Team: Hulan Fess, MD as PCP - General  DIAGNOSIS: Essential thrombocytosis and hydroxyurea 500 mg twice a week  CHIEF COMPLIANT: fatigue, mild dizziness/vertigo  INTERVAL HISTORY: Deanna Hill is a 75 year old with above-mentioned history of essential thrombocytosis who was on hydroxyurea. I readjusted her hydroxyurea dose to 2 times a week. She is here today for a three-month follow-up and it appears that on today's blood work she is more anemic with a hemoglobin of 9.8 She does complain of more fatigue than previously. she is also concerned with her irritable bowel syndrome which is acting up. She continues to have diarrhea from it.  REVIEW OF SYSTEMS:   Constitutional: Denies fevers, chills or abnormal weight loss Eyes: Denies blurriness of vision Ears, nose, mouth, throat, and face: Denies mucositis or sore throat Respiratory: Denies cough, dyspnea or wheezes Cardiovascular: Denies palpitation, chest discomfort Gastrointestinal:  Diarrhea alternating with constipation Skin: Denies abnormal skin rashes Lymphatics: Denies new lymphadenopathy or easy bruising Neurological:Denies numbness, tingling or new weaknesses Behavioral/Psych: Mood is stable, no new changes  Extremities: No lower extremity edema Breast:  denies any pain or lumps or nodules in either breasts All other systems were reviewed with the patient and are negative.  I have reviewed the past medical history, past surgical history, social history and family history with the patient and they are unchanged from previous note.  ALLERGIES:  is allergic to iodine and shellfish allergy.  MEDICATIONS:  Current Outpatient Prescriptions  Medication Sig Dispense Refill  . acetaminophen (TYLENOL) 325 MG tablet Take 650 mg by mouth daily as needed for mild pain.    [name] ALPRAZolam (XANAX) 0.25 MG tablet Take 0.5 tablets (0.125 mg total) by mouth 3 (three) times daily as needed for anxiety. 30 tablet 2  . aspirin  325 MG tablet Take 325 mg by mouth daily.     [name] BIOTIN PO Take 1 capsule by mouth daily.    . calcium carbonate (TUMS - DOSED IN MG ELEMENTAL CALCIUM) 500 MG chewable tablet Chew 2 tablets by mouth daily as needed for indigestion or heartburn.    . Cholecalciferol (VITAMIN D) 2000 UNITS CAPS Take 1 capsule by mouth daily.    . Evening Primrose Oil 500 MG CAPS Take 1 capsule by mouth 2 (two) times daily.    . hydroxyurea (HYDREA) 500 MG capsule TAKE 1 CAPSULE (500 MG TOTAL) BY MOUTH AS DIRECTED. TAKE MONDAY WEDNESDAY AND FRIDAY. (Patient taking differently: TAKE 1 CAPSULE (500 MG TOTAL) BY MOUTH AS DIRECTED. TAKE MONDAY AND Thursday.[name]) 48 capsule 3  . levothyroxine (SYNTHROID, LEVOTHROID) 100 MCG tablet Take 100 mcg by mouth daily.     . metroNIDAZOLE (METROCREAM) 0.75 % cream Apply 1 application topically daily as needed (for rosacea).    . Multiple Vitamin (MULTIVITAMIN) tablet Take 1 tablet by mouth daily.     . nitroGLYCERIN (NITROSTAT) 0.4 MG SL tablet Place 0.4 mg under the tongue every 5 (five) minutes as needed for chest pain.    . ranitidine (ZANTAC) 150 MG capsule Take 150 mg by mouth 2 (two) times daily.     [name] terbinafine (LAMISIL) 250 MG tablet Take 1 tablet (250 mg total) by mouth daily. (Patient not taking: Reported on 06/19/2015) 30 tablet 0   No current facility-administered medications for this visit.    PHYSICAL EXAMINATION: ECOG PERFORMANCE STATUS: 1 - Symptomatic but completely ambulatory  Filed Vitals:   07/17/15 1009  BP: 113/45  Pulse: 18  Temp: 92 F (33.3 C)  Resp: 18   Filed Weights   07/17/15 1009  Weight: 162 lb 11.2 oz (73.8 kg)    GENERAL:alert, no distress and comfortable SKIN: skin color, texture, turgor are normal, no rashes or significant lesions EYES: normal, Conjunctiva are pink and non-injected, sclera clear OROPHARYNX:no exudate, no erythema and lips, buccal mucosa, and tongue normal  NECK: supple, thyroid normal size, non-tender, without  nodularity LYMPH:  no palpable lymphadenopathy in the cervical, axillary or inguinal LUNGS: clear to auscultation and percussion with normal breathing effort HEART: regular rate & rhythm and no murmurs and no lower extremity edema ABDOMEN:abdomen soft, non-tender and normal bowel sounds MUSCULOSKELETAL:no cyanosis of digits and no clubbing  NEURO: alert & oriented x 3 with fluent speech, no focal motor/sensory deficits EXTREMITIES: No lower extremity edema  LABORATORY DATA:  I have reviewed the data as listed   Chemistry      Component Value Date/Time   NA 140 04/19/2015 1107   NA 144 11/20/2013 0542   NA 137 09/11/2009 1024   K 4.2 04/19/2015 1107   K 4.1 11/20/2013 0542   K 4.4 09/11/2009 1024   CL 106 11/20/2013 0542   CL 107 06/24/2012 0859   CL 100 09/11/2009 1024   CO2 25 04/19/2015 1107   CO2 25 11/20/2013 0542   CO2 30 09/11/2009 1024   BUN 21.1 04/19/2015 1107   BUN 24* 11/20/2013 0542   BUN 19 09/11/2009 1024   CREATININE 0.9 04/19/2015 1107   CREATININE 0.92 11/20/2013 0542   CREATININE 0.9 09/11/2009 1024      Component Value Date/Time   CALCIUM 9.1 04/19/2015 1107   CALCIUM 9.7 11/20/2013 0542   CALCIUM 9.0 09/11/2009 1024   ALKPHOS 50 04/19/2015 1107   ALKPHOS 49 11/20/2013 0542   ALKPHOS 45 09/11/2009 1024   AST 14 04/19/2015 1107   AST 14 11/20/2013 0542   AST 21 09/11/2009 1024   ALT 12 04/19/2015 1107   ALT 10 11/20/2013 0542   ALT 23 09/11/2009 1024   BILITOT <0.30 04/19/2015 1107   BILITOT 0.2* 11/20/2013 0542   BILITOT 0.50 09/11/2009 1024       Lab Results  Component Value Date   WBC 8.9 07/17/2015   HGB 9.8* 07/17/2015   HCT 32.7* 07/17/2015   MCV 83.4 07/17/2015   PLT 455* 07/17/2015   NEUTROABS 5.6 07/17/2015     ASSESSMENT & PLAN:  Essential thrombocytosis Essential thrombocytosis with JAK2 mutation: Continue Hydrea 500 mg daily and also aspirin 325 mg by mouth once daily. She is tolerating it quite well. Her plt count is  316 today. She is tolerating the medication well.Reduced the dosage of Hydrea 500 mg 3 times a week since 07/17/2014, reducing the dosage of Hydrea to 500 mg twice a week from 08/03/2016and back up to 3 times a week starting 01/18/2015, reduced to twice a week on 04/19/2015 ( due to decrease in hemoglobin)  Hydrea toxicities: 1. Fatigue  2. Hair loss  3. brittle nails   Irritable bowel syndrome with diarrhea: Patient is also having issues with irritable bowel syndrome which is limiting her day-to-day activities.  I reviewed her counts and the platelet count is 455 was 411. However her hemoglobin has also declined to 9.8. I suspect this is related to hydroxyurea therapy. Currently on hydroxyurea to twice a week I will obtain iron studies B 12 and folic acid with the next lab work  Normocytic anemia: Hemoglobin is 9.8 today. I suspect this is related to hydroxyurea.  We will make sure that there are no other nutritional deficiencies by obtaining 0000000 folic acid and iron studies Return to clinic in 3 months for follow-upwith labs.   No orders of the defined types were placed in this encounter.   The patient has a good understanding of the overall plan. she agrees with it. she will call with any problems that may develop before the next visit here.   Rulon Eisenmenger, MD 07/17/2015

## 2015-07-17 NOTE — Assessment & Plan Note (Signed)
Essential thrombocytosis with JAK2 mutation: Continue Hydrea 500 mg daily and also aspirin 325 mg by mouth once daily. She is tolerating it quite well. Her plt count is 316 today. She is tolerating the medication well.Reduced the dosage of Hydrea 500 mg 3 times a week since 07/17/2014, reducing the dosage of Hydrea to 500 mg twice a week from 08/03/2016and back up to 3 times a week starting 01/18/2015, reduced to twice a week on 04/19/2015 ( due to decrease in hemoglobin)  Hydrea toxicities: 1. Fatigue  2. Hair loss  3. brittle nails   Irritable bowel syndrome with diarrhea: Patient is also having issues with irritable bowel syndrome which is limiting her day-to-day activities.  I reviewed her counts and the platelet count is  411 (was 472). However her hemoglobin has also declined to 10.2. I suspect this is related to hydroxyurea therapy. I recommended reducing the dosage of hydroxyurea to twice a week  Normocytic anemia: Hemoglobin is 7.2 today. I suspect this is related to hydroxyurea.  Return to clinic in 3 months for follow-upwith labs.

## 2015-07-23 ENCOUNTER — Ambulatory Visit: Payer: Medicare Other | Admitting: Podiatry

## 2015-07-30 ENCOUNTER — Ambulatory Visit (INDEPENDENT_AMBULATORY_CARE_PROVIDER_SITE_OTHER): Payer: Medicare Other | Admitting: Podiatry

## 2015-07-30 DIAGNOSIS — B351 Tinea unguium: Secondary | ICD-10-CM | POA: Diagnosis not present

## 2015-07-30 MED ORDER — TERBINAFINE HCL 250 MG PO TABS: 250.0000 mg | ORAL_TABLET | Freq: Every day | ORAL | Status: DC

## 2015-07-30 NOTE — Progress Notes (Signed)
She presents today for follow-up of her Lamisil therapy. She had a delay starting the medication secondary to trepidation regarding the medication itself. She has been now been taking it for 2 weeks without complications no signs of rash or itching.  Objective: No change in nail plates yet. Onychomycosis is the diagnosis.  Assessment: Onychomycosis.  Plan: She will continue the use of the terbinafine we will go ahead and increase her next dose to 90 tablets. We also requested liver profile with a CBC to be performed of the next week or so and I will follow-up with her in 4 months. Should there be questions or concerns she will notify S immediately or we will call her with concerns regarding her blood work.

## 2015-08-21 ENCOUNTER — Telehealth: Payer: Self-pay | Admitting: *Deleted

## 2015-08-21 LAB — HEPATIC FUNCTION PANEL
ALK PHOS: 46 U/L (ref 33–130)
ALT: 8 U/L (ref 6–29)
AST: 13 U/L (ref 10–35)
Albumin: 4.2 g/dL (ref 3.6–5.1)
BILIRUBIN INDIRECT: 0.2 mg/dL (ref 0.2–1.2)
Bilirubin, Direct: 0.1 mg/dL (ref <=0.2)
Total Bilirubin: 0.3 mg/dL (ref 0.2–1.2)
Total Protein: 6.5 g/dL (ref 6.1–8.1)

## 2015-08-21 NOTE — Telephone Encounter (Addendum)
-----   Message from Garrel Ridgel, [place] sent at 08/21/2015  6:26 AM EDT ----- Blood work looks perfect.  Left message informing Deanna Hill of Dr. Stephenie Acres orders to continue the medication.

## 2015-09-20 ENCOUNTER — Encounter: Payer: Self-pay | Admitting: Podiatry

## 2015-10-16 NOTE — Assessment & Plan Note (Signed)
Essential thrombocytosis with JAK2 mutation: Continue Hydrea 500 mg daily and also aspirin 325 mg by mouth once daily. She is tolerating it quite well. Her plt count is 316 today. She is tolerating the medication well.Reduced the dosage of Hydrea 500 mg 3 times a week since 07/17/2014, reducing the dosage of Hydrea to 500 mg twice a week from 08/03/2016and back up to 3 times a week starting 01/18/2015, reduced to twice a week on 04/19/2015 ( due to decrease in hemoglobin)  Hydrea toxicities: 1. Fatigue  2. Hair loss  3. brittle nails   Irritable bowel syndrome with diarrhea: Patient is also having issues with irritable bowel syndrome which is limiting her day-to-day activities.  I reviewed her counts and the platelet count is 455 was 411. However her hemoglobin has also declined to 9.8. I suspect this is related to hydroxyurea therapy. Currently on hydroxyurea to twice a week I will obtain iron studies B 12 and folic acid with the next lab work  Normocytic anemia: Hemoglobin is 9.8 today. I suspect this is related to hydroxyurea. We will make sure that there are no other nutritional deficiencies by obtaining 0000000 folic acid and iron studies Return to clinic in 6 months for follow-upwith labs.

## 2015-10-17 ENCOUNTER — Encounter: Payer: Self-pay | Admitting: Hematology and Oncology

## 2015-10-17 ENCOUNTER — Other Ambulatory Visit: Payer: Self-pay | Admitting: Hematology and Oncology

## 2015-10-17 ENCOUNTER — Other Ambulatory Visit: Payer: Self-pay | Admitting: *Deleted

## 2015-10-17 ENCOUNTER — Ambulatory Visit (HOSPITAL_BASED_OUTPATIENT_CLINIC_OR_DEPARTMENT_OTHER): Payer: Medicare Other

## 2015-10-17 ENCOUNTER — Telehealth: Payer: Self-pay | Admitting: Internal Medicine

## 2015-10-17 ENCOUNTER — Telehealth: Payer: Self-pay | Admitting: Hematology and Oncology

## 2015-10-17 ENCOUNTER — Ambulatory Visit (HOSPITAL_BASED_OUTPATIENT_CLINIC_OR_DEPARTMENT_OTHER): Payer: Medicare Other | Admitting: Hematology and Oncology

## 2015-10-17 DIAGNOSIS — D649 Anemia, unspecified: Secondary | ICD-10-CM | POA: Diagnosis not present

## 2015-10-17 DIAGNOSIS — D473 Essential (hemorrhagic) thrombocythemia: Secondary | ICD-10-CM | POA: Diagnosis not present

## 2015-10-17 DIAGNOSIS — L659 Nonscarring hair loss, unspecified: Secondary | ICD-10-CM

## 2015-10-17 DIAGNOSIS — L603 Nail dystrophy: Secondary | ICD-10-CM

## 2015-10-17 DIAGNOSIS — R5383 Other fatigue: Secondary | ICD-10-CM | POA: Diagnosis not present

## 2015-10-17 DIAGNOSIS — D509 Iron deficiency anemia, unspecified: Secondary | ICD-10-CM

## 2015-10-17 LAB — CBC & DIFF AND RETIC
BASO%: 1 % (ref 0.0–2.0)
BASOS ABS: 0.1 10*3/uL (ref 0.0–0.1)
EOS ABS: 1.4 10*3/uL — AB (ref 0.0–0.5)
EOS%: 16.2 % — ABNORMAL HIGH (ref 0.0–7.0)
HEMATOCRIT: 30.4 % — AB (ref 34.8–46.6)
HEMOGLOBIN: 9 g/dL — AB (ref 11.6–15.9)
IMMATURE RETIC FRACT: 9.1 % (ref 1.60–10.00)
LYMPH%: 11.1 % — ABNORMAL LOW (ref 14.0–49.7)
MCH: 23.3 pg — AB (ref 25.1–34.0)
MCHC: 29.6 g/dL — ABNORMAL LOW (ref 31.5–36.0)
MCV: 78.6 fL — ABNORMAL LOW (ref 79.5–101.0)
MONO#: 0.7 10*3/uL (ref 0.1–0.9)
MONO%: 8.5 % (ref 0.0–14.0)
NEUT#: 5.3 10*3/uL (ref 1.5–6.5)
NEUT%: 63.2 % (ref 38.4–76.8)
Platelets: 414 10*3/uL — ABNORMAL HIGH (ref 145–400)
RBC: 3.87 10*6/uL (ref 3.70–5.45)
RDW: 17.4 % — AB (ref 11.2–14.5)
RETIC %: 0.65 % — AB (ref 0.70–2.10)
RETIC CT ABS: 25.16 10*3/uL — AB (ref 33.70–90.70)
WBC: 8.4 10*3/uL (ref 3.9–10.3)
lymph#: 0.9 10*3/uL (ref 0.9–3.3)

## 2015-10-17 LAB — IRON AND TIBC
%SAT: 6 % — AB (ref 21–57)
IRON: 25 ug/dL — AB (ref 41–142)
TIBC: 400 ug/dL (ref 236–444)
UIBC: 376 ug/dL (ref 120–384)

## 2015-10-17 LAB — FERRITIN: FERRITIN: 7 ng/mL — AB (ref 9–269)

## 2015-10-17 NOTE — Telephone Encounter (Signed)
Per pof to sch pt appt-gave pt copy of avs/cal

## 2015-10-17 NOTE — Progress Notes (Signed)
Patient Care Team: Hulan Fess, MD as PCP - General  DIAGNOSIS: Essential thrombocytosis and hydroxyurea 500 mg twice a week  CHIEF COMPLIANT: fatigue, mild dizziness/vertigo  INTERVAL HISTORY: Deanna Hill is a 75 year old with above-mentioned history of essential thrombocytosis who is currently on hydroxyurea and aspirin therapy. She continues to have intermittent dizziness and lightheadedness. Previously she was found to be anemic. Iron studies 0000000 and folic acid are to be drawn today. She continues to have diarrhea related to irritable bowel syndrome.  REVIEW OF SYSTEMS:   Constitutional: Denies fevers, chills or abnormal weight loss Eyes: Denies blurriness of vision Ears, nose, mouth, throat, and face: Denies mucositis or sore throat Respiratory: Denies cough, dyspnea or wheezes Cardiovascular: Denies palpitation, chest discomfort Gastrointestinal:  Diarrhea due to irritable bowel syndrome Skin: Denies abnormal skin rashes Lymphatics: Denies new lymphadenopathy or easy bruising Neurological: Dizziness Behavioral/Psych: Mood is stable, no new changes  Extremities: No lower extremity edema  All other systems were reviewed with the patient and are negative.  I have reviewed the past medical history, past surgical history, social history and family history with the patient and they are unchanged from previous note.  ALLERGIES:  is allergic to iodine and shellfish allergy.  MEDICATIONS:  Current Outpatient Prescriptions  Medication Sig Dispense Refill  . acetaminophen (TYLENOL) 325 MG tablet Take 650 mg by mouth daily as needed for mild pain.    [name] ALPRAZolam (XANAX) 0.25 MG tablet Take 0.5 tablets (0.125 mg total) by mouth 3 (three) times daily as needed for anxiety. 30 tablet 2  . aspirin 325 MG tablet Take 325 mg by mouth daily.     [name] BIOTIN PO Take 1 capsule by mouth daily.    . calcium carbonate (TUMS - DOSED IN MG ELEMENTAL CALCIUM) 500 MG chewable tablet Chew 2  tablets by mouth daily as needed for indigestion or heartburn.    . Cholecalciferol (VITAMIN D) 2000 UNITS CAPS Take 1 capsule by mouth daily.    . Evening Primrose Oil 500 MG CAPS Take 1 capsule by mouth 2 (two) times daily.    . hydroxyurea (HYDREA) 500 MG capsule TAKE 1 CAPSULE (500 MG TOTAL) BY MOUTH AS DIRECTED. TAKE MONDAY WEDNESDAY AND FRIDAY. (Patient taking differently: TAKE 1 CAPSULE (500 MG TOTAL) BY MOUTH AS DIRECTED. TAKE MONDAY AND Thursday.[name]) 48 capsule 3  . levothyroxine (SYNTHROID, LEVOTHROID) 100 MCG tablet Take 100 mcg by mouth daily.     . metroNIDAZOLE (METROCREAM) 0.75 % cream Apply 1 application topically daily as needed (for rosacea).    . Multiple Vitamin (MULTIVITAMIN) tablet Take 1 tablet by mouth daily.     . nitroGLYCERIN (NITROSTAT) 0.4 MG SL tablet Place 0.4 mg under the tongue every 5 (five) minutes as needed for chest pain.    . ranitidine (ZANTAC) 150 MG capsule Take 150 mg by mouth 2 (two) times daily.     [name] terbinafine (LAMISIL) 250 MG tablet Take 1 tablet (250 mg total) by mouth daily. (Patient not taking: Reported on 06/19/2015) 30 tablet 0  . terbinafine (LAMISIL) 250 MG tablet Take 1 tablet (250 mg total) by mouth daily. 90 tablet 0   No current facility-administered medications for this visit.     PHYSICAL EXAMINATION: ECOG PERFORMANCE STATUS: 1 - Symptomatic but completely ambulatory  Vitals:   10/17/15 1028  BP: 139/66  Pulse: 96  Resp: 18  Temp: 98.9 F (37.2 C)   Filed Weights   10/17/15 1028  Weight: 163 lb 4.8 oz (74.1  kg)    GENERAL:alert, no distress and comfortable SKIN: skin color, texture, turgor are normal, no rashes or significant lesions EYES: normal, Conjunctiva are pink and non-injected, sclera clear OROPHARYNX:no exudate, no erythema and lips, buccal mucosa, and tongue normal  NECK: supple, thyroid normal size, non-tender, without nodularity LYMPH:  no palpable lymphadenopathy in the cervical, axillary or inguinal LUNGS:  clear to auscultation and percussion with normal breathing effort HEART: regular rate & rhythm and no murmurs and no lower extremity edema ABDOMEN:abdomen soft, non-tender and normal bowel sounds MUSCULOSKELETAL:no cyanosis of digits and no clubbing  NEURO: alert & oriented x 3 with fluent speech, no focal motor/sensory deficits EXTREMITIES: No lower extremity edema  LABORATORY DATA:  I have reviewed the data as listed   Chemistry      Component Value Date/Time   NA 140 04/19/2015 1107   K 4.2 04/19/2015 1107   CL 106 11/20/2013 0542   CL 107 06/24/2012 0859   CO2 25 04/19/2015 1107   BUN 21.1 04/19/2015 1107   CREATININE 0.9 04/19/2015 1107      Component Value Date/Time   CALCIUM 9.1 04/19/2015 1107   ALKPHOS 46 08/20/2015 1050   ALKPHOS 50 04/19/2015 1107   AST 13 08/20/2015 1050   AST 14 04/19/2015 1107   ALT 8 08/20/2015 1050   ALT 12 04/19/2015 1107   BILITOT 0.3 08/20/2015 1050   BILITOT <0.30 04/19/2015 1107       Lab Results  Component Value Date   WBC 8.4 10/17/2015   HGB 9.0 (L) 10/17/2015   HCT 30.4 (L) 10/17/2015   MCV 78.6 (L) 10/17/2015   PLT 414 (H) 10/17/2015   NEUTROABS 5.3 10/17/2015     ASSESSMENT & PLAN:  Essential thrombocytosis Essential thrombocytosis with JAK2 mutation: Continue Hydrea 500 mg daily and also aspirin 325 mg by mouth once daily. She is tolerating it quite well. Her plt count is 316 today. She is tolerating the medication well.Reduced the dosage of Hydrea 500 mg 3 times a week since 07/17/2014, reducing the dosage of Hydrea to 500 mg twice a week from 08/03/2016and back up to 3 times a week starting 01/18/2015, reduced to twice a week on 04/19/2015 and to once a week on 10/17/2015 ( due to decrease in hemoglobin)  Hydrea toxicities: 1. Fatigue  2. Hair loss  3. brittle nails   I reviewed her counts and the platelet count is 415. We are reducing hydroxyurea to once a week  Irritable bowel syndrome with diarrhea:  Patient is also having issues with irritable bowel syndrome which is limiting her day-to-day activities.  Normocytic anemia: Hemoglobin is 9 today. I suspect this is related to hydroxyurea + iron deficiency anemia. We are waiting for iron studies to come back. We will give her IV iron treatment if she is found to be iron deficient.  Return to clinic in 3 months for lab: Check and follow-up.  Orders Placed This Encounter  Procedures  . CBC & Diff and Retic    Standing Status:   Future    Number of Occurrences:   1    Standing Expiration Date:   10/16/2016  . Vitamin B12    Standing Status:   Future    Number of Occurrences:   1    Standing Expiration Date:   10/16/2016  . Iron and TIBC    Standing Status:   Future    Number of Occurrences:   1    Standing Expiration Date:  10/16/2016  . Ferritin    Standing Status:   Future    Number of Occurrences:   1    Standing Expiration Date:   10/16/2016  . Folate, Serum    Standing Status:   Future    Number of Occurrences:   1    Standing Expiration Date:   10/16/2016  . CBC & Diff and Retic    Standing Status:   Future    Standing Expiration Date:   10/16/2016  . Ferritin    Standing Status:   Future    Standing Expiration Date:   10/16/2016  . Erythropoietin    Standing Status:   Future    Standing Expiration Date:   10/16/2016  . Iron and TIBC    Standing Status:   Future    Standing Expiration Date:   10/16/2016   The patient has a good understanding of the overall plan. she agrees with it. she will call with any problems that may develop before the next visit here.   Rulon Eisenmenger, MD 10/17/15

## 2015-10-17 NOTE — Telephone Encounter (Signed)
per pof to send pt back to lab-pt aware

## 2015-10-18 ENCOUNTER — Telehealth: Payer: Self-pay | Admitting: Hematology and Oncology

## 2015-10-18 LAB — FOLATE: Folate: >20 ng/mL (ref >3.0)

## 2015-10-18 LAB — VITAMIN B12: Vitamin B12: 804 pg/mL (ref 211–946)

## 2015-10-18 NOTE — Telephone Encounter (Signed)
cld & spoke to pt and gave pt time & date of appt for 8/8@10 :30

## 2015-10-22 ENCOUNTER — Ambulatory Visit (HOSPITAL_BASED_OUTPATIENT_CLINIC_OR_DEPARTMENT_OTHER): Payer: Medicare Other

## 2015-10-22 VITALS — BP 168/82 | HR 81 | Temp 99.4°F | Resp 18

## 2015-10-22 DIAGNOSIS — D509 Iron deficiency anemia, unspecified: Secondary | ICD-10-CM

## 2015-10-22 MED ORDER — SODIUM CHLORIDE 0.9 % IV SOLN
Freq: Once | INTRAVENOUS | Status: AC
  Administered 2015-10-22: 11:00:00 via INTRAVENOUS

## 2015-10-22 MED ORDER — FERUMOXYTOL INJECTION 510 MG/17 ML
510.0000 mg | Freq: Once | INTRAVENOUS | Status: AC
  Administered 2015-10-22: 510 mg via INTRAVENOUS
  Filled 2015-10-22: qty 17

## 2015-10-22 NOTE — Patient Instructions (Signed)

## 2015-10-29 ENCOUNTER — Ambulatory Visit (HOSPITAL_BASED_OUTPATIENT_CLINIC_OR_DEPARTMENT_OTHER): Payer: Medicare Other

## 2015-10-29 VITALS — BP 125/55 | HR 88 | Temp 99.1°F | Resp 18

## 2015-10-29 DIAGNOSIS — D509 Iron deficiency anemia, unspecified: Secondary | ICD-10-CM

## 2015-10-29 MED ORDER — SODIUM CHLORIDE 0.9 % IV SOLN
510.0000 mg | Freq: Once | INTRAVENOUS | Status: AC
  Administered 2015-10-29: 510 mg via INTRAVENOUS
  Filled 2015-10-29: qty 17

## 2015-10-29 MED ORDER — SODIUM CHLORIDE 0.9 % IV SOLN
Freq: Once | INTRAVENOUS | Status: AC
  Administered 2015-10-29: 09:00:00 via INTRAVENOUS

## 2015-10-29 NOTE — Patient Instructions (Signed)

## 2015-11-28 ENCOUNTER — Ambulatory Visit: Payer: Medicare Other | Admitting: Podiatry

## 2015-12-19 ENCOUNTER — Ambulatory Visit: Payer: Medicare Other | Admitting: Adult Health

## 2015-12-24 ENCOUNTER — Ambulatory Visit (INDEPENDENT_AMBULATORY_CARE_PROVIDER_SITE_OTHER): Payer: Medicare Other | Admitting: Podiatry

## 2015-12-24 ENCOUNTER — Encounter: Payer: Self-pay | Admitting: Podiatry

## 2015-12-24 DIAGNOSIS — B351 Tinea unguium: Secondary | ICD-10-CM

## 2015-12-24 DIAGNOSIS — M79676 Pain in unspecified toe(s): Secondary | ICD-10-CM

## 2015-12-24 MED ORDER — TERBINAFINE HCL 250 MG PO TABS: 250.0000 mg | ORAL_TABLET | Freq: Every day | ORAL | 0 refills | Status: DC

## 2015-12-25 NOTE — Progress Notes (Signed)
She presents today after having completed 120 days of Lamisil. She states that it seems that they are growing out quite nicely. She states that there is some left only and that concerned about.  Objective: Vital signs are stable she is alert and oriented 3 her nails are thick distally but appear to be growing out quite nicely and mildly tender on palpation.  Assessment: Well-healing onychomycosis with the use of long-term Lamisil.  Plan: I encouraged her to take Lamisil every other day and a prescription was dispensed for this. She will take this for the next 2 months and will follow up with me in 3 months. Also debrided her toenails for her today.

## 2015-12-30 ENCOUNTER — Ambulatory Visit (INDEPENDENT_AMBULATORY_CARE_PROVIDER_SITE_OTHER): Payer: Medicare Other | Admitting: Adult Health

## 2015-12-30 ENCOUNTER — Encounter: Payer: Self-pay | Admitting: Adult Health

## 2015-12-30 VITALS — BP 158/74 | HR 85 | Ht 69.0 in | Wt 162.8 lb

## 2015-12-30 DIAGNOSIS — G250 Essential tremor: Secondary | ICD-10-CM

## 2015-12-30 NOTE — Progress Notes (Signed)
I have read the note, and I agree with the clinical assessment and plan.  Nickolette Espinola KEITH   

## 2015-12-30 NOTE — Patient Instructions (Signed)
Use Xanax as needed for tremor If your symptoms worsen or you develop new symptoms please let us know.

## 2015-12-30 NOTE — Progress Notes (Signed)
PATIENT: Deanna Hill DOB: 11/05/1940  REASON FOR VISIT: follow up-essential tremor HISTORY FROM: patient  HISTORY OF PRESENT ILLNESS:  Deanna Hill is a 75 year old female with a history of essential tremor. She returns today for follow-up. The patient feels that her tremor has remained relatively the same. She states that she has used Xanax in the past but it does make her sleepy. She states that she is able to complete all ADLs independently. The tremor primarily affects her when she is trying to eat. Specifically still. She states that she uses 2 hands she can manage. She also notices it with her handwriting. She does drive without difficulty. She denies any new neurological symptoms. She returns today for an evaluation.    HISTORY 06/19/15: Deanna Hill is a 75 year old right-handed white female with a history of an essential tremor that dates back at least 20 years. The patient indicates that her mother also has a similar tremor. The patient was seen through this office on 01/08/2011. At that time, we opted not to initiate treatment for the tremor. The patient indicates that both hands are affected, she is right-handed, and will note some problems with her handwriting at times. The patient has difficulty feeding herself occasionally as well. The patient indicates that there are good days and bad days with the tremor, the tremor may be more prominent when she is upset or if she is cold. The patient has also reports some occasional episodes with dizziness with standing, occasionally she may have some transient soreness of the scalp that may come and go, she reports a pre-existing history of migraine. The patient does have some mild gait instability, she denies any falls. The patient denies any weakness of the extremities. She does report some irritable bowel syndrome issues with alternating diarrhea and constipation. She is sent to this office for an evaluation.  REVIEW OF SYSTEMS: Out of a  complete 14 system review of symptoms, the patient complains only of the following symptoms, and all other reviewed systems are negative.  Incontinence of bowels, back pain, tremors, headache ALLERGIES: Allergies  Allergen Reactions  . Iodine Other (See Comments)    Pt states "Deadly"  . Prednisone     Facial flushing and swelling  . Shellfish Allergy Other (See Comments)    " I almost died once"     HOME MEDICATIONS: Outpatient Medications Prior to Visit  Medication Sig Dispense Refill  . acetaminophen (TYLENOL) 325 MG tablet Take 650 mg by mouth daily as needed for mild pain.    [name] ALPRAZolam (XANAX) 0.25 MG tablet Take 0.5 tablets (0.125 mg total) by mouth 3 (three) times daily as needed for anxiety. 30 tablet 2  . aspirin 325 MG tablet Take 325 mg by mouth daily.     [name] BIOTIN PO Take 1 capsule by mouth daily.    . calcium carbonate (TUMS - DOSED IN MG ELEMENTAL CALCIUM) 500 MG chewable tablet Chew 2 tablets by mouth daily as needed for indigestion or heartburn.    . Cholecalciferol (VITAMIN D) 2000 UNITS CAPS Take 1 capsule by mouth daily.    . Evening Primrose Oil 500 MG CAPS Take 1 capsule by mouth 2 (two) times daily.    . hydroxyurea (HYDREA) 500 MG capsule TAKE 1 CAPSULE (500 MG TOTAL) BY MOUTH AS DIRECTED. TAKE MONDAY WEDNESDAY AND FRIDAY. (Patient taking differently: TAKE 1 CAPSULE (500 MG TOTAL) BY MOUTH AS DIRECTED. TAKE Monday.) 48 capsule 3  . levothyroxine (SYNTHROID, LEVOTHROID) 100 MCG  tablet Take 100 mcg by mouth daily.     . metroNIDAZOLE (METROCREAM) 0.75 % cream Apply 1 application topically daily as needed (for rosacea).    . Multiple Vitamin (MULTIVITAMIN) tablet Take 1 tablet by mouth daily.     . nitroGLYCERIN (NITROSTAT) 0.4 MG SL tablet Place 0.4 mg under the tongue every 5 (five) minutes as needed for chest pain.    . ranitidine (ZANTAC) 150 MG capsule Take 150 mg by mouth 2 (two) times daily.     [name] terbinafine (LAMISIL) 250 MG tablet Take 1 tablet (250 mg  total) by mouth daily. (Patient taking differently: Take 250 mg by mouth daily. Taking every other day for 2 months.  12-30-15) 30 tablet 0  . terbinafine (LAMISIL) 250 MG tablet Take 1 tablet (250 mg total) by mouth daily. (Patient not taking: Reported on 06/19/2015) 30 tablet 0  . terbinafine (LAMISIL) 250 MG tablet Take 1 tablet (250 mg total) by mouth daily. 90 tablet 0   No facility-administered medications prior to visit.     PAST MEDICAL HISTORY: Past Medical History:  Diagnosis Date  . Anxiety   . Essential tremor 06/19/2015  . GERD (gastroesophageal reflux disease)   . Hyperlipidemia   . Hypothyroidism   . IBS (irritable bowel syndrome)   . Thrombocytosis ([place]) 03/11/2011    PAST SURGICAL HISTORY: Past Surgical History:  Procedure Laterality Date  . ABDOMINAL HYSTERECTOMY    . CATARACT EXTRACTION    . CHOLECYSTECTOMY, LAPAROSCOPIC    . LEFT HEART CATHETERIZATION WITH CORONARY ANGIOGRAM N/A 06/08/2013   Procedure: LEFT HEART CATHETERIZATION WITH CORONARY ANGIOGRAM;  Surgeon: Jacolyn Reedy, MD;  Location: [facility] CATH LAB;  Service: Cardiovascular;  Laterality: N/A;  . TOE SURGERY    . TONSILLECTOMY      FAMILY HISTORY: Family History  Problem Relation Age of Onset  . Skin cancer Mother     SOCIAL HISTORY: Social History   Social History  . Marital status: Widowed    Spouse name: N/A  . Number of children: N/A  . Years of education: N/A   Occupational History  . Not on file.   Social History Main Topics  . Smoking status: Never Smoker  . Smokeless tobacco: Never Used  . Alcohol use No  . Drug use: No  . Sexual activity: Not Currently   Other Topics Concern  . Not on file   Social History Narrative   Lives home alone.  Retired.  12 th grade education.        PHYSICAL EXAM  Vitals:   12/30/15 1323  BP: (!) 158/74  Pulse: 85  Weight: 162 lb 12.8 oz (73.8 kg)  Height: 5\' 9"  (1.753 m)   Body mass index is 24.04 kg/m.  Generalized: Well  developed, in no acute distress   Neurological examination  Mentation: Alert oriented to time, place, history taking. Follows all commands speech and language fluent Cranial nerve II-XII: Pupils were equal round reactive to light. Extraocular movements were full, visual field were full on confrontational test. Facial sensation and strength were normal. Uvula tongue midline. Head turning and shoulder shrug  were normal and symmetric. Motor: The motor testing reveals 5 over 5 strength of all 4 extremities. Good symmetric motor tone is noted throughout.  Sensory: Sensory testing is intact to soft touch on all 4 extremities. No evidence of extinction is noted.  Coordination: Cerebellar testing reveals good finger-nose-finger and heel-to-shin bilaterally. Intention tremor noted in both hands. Gait and station: Gait is  normal. Slightly stooped posture. Tandem gait is unsteady.. Romberg is negative. No drift is seen.  Reflexes: Deep tendon reflexes are symmetric and normal bilaterally.   DIAGNOSTIC DATA (LABS, IMAGING, TESTING) - I reviewed patient records, labs, notes, testing and imaging myself where available.  Lab Results  Component Value Date   WBC 8.4 10/17/2015   HGB 9.0 (L) 10/17/2015   HCT 30.4 (L) 10/17/2015   MCV 78.6 (L) 10/17/2015   PLT 414 (H) 10/17/2015      Component Value Date/Time   NA 140 04/19/2015 1107   K 4.2 04/19/2015 1107   CL 106 11/20/2013 0542   CL 107 06/24/2012 0859   CO2 25 04/19/2015 1107   GLUCOSE 94 04/19/2015 1107   GLUCOSE 81 06/24/2012 0859   BUN 21.1 04/19/2015 1107   CREATININE 0.9 04/19/2015 1107   CALCIUM 9.1 04/19/2015 1107   PROT 6.5 08/20/2015 1050   PROT 6.9 04/19/2015 1107   ALBUMIN 4.2 08/20/2015 1050   ALBUMIN 3.9 04/19/2015 1107   AST 13 08/20/2015 1050   AST 14 04/19/2015 1107   ALT 8 08/20/2015 1050   ALT 12 04/19/2015 1107   ALKPHOS 46 08/20/2015 1050   ALKPHOS 50 04/19/2015 1107   BILITOT 0.3 08/20/2015 1050   BILITOT <0.30  04/19/2015 1107   GFRNONAA 60 (L) 11/20/2013 0542   GFRAA 69 (L) 11/20/2013 0542      ASSESSMENT AND PLAN 75 y.o. year old female  has a past medical history of Anxiety; Essential tremor (06/19/2015); GERD (gastroesophageal reflux disease); Hyperlipidemia; Hypothyroidism; IBS (irritable bowel syndrome); and Thrombocytosis ([place]) (03/11/2011). here with:  1. Essential tremor  Overall the patient's tremor has remained stable. She will continue using Xanax if needed. At this time daily medication is not needed. Advised patient that if her symptoms worsen or she develops any new symptoms she should let us do. Follow-up in 6 months or sooner if needed.     Ward Givens, MSN, NP-C 12/30/2015, 2:08 PM [facility] [address], [place], [place] [address] [phone]

## 2016-01-15 NOTE — Assessment & Plan Note (Signed)
Essential thrombocytosis with JAK2 mutation: Continue Hydrea 500 mg daily and also aspirin 325 mg by mouth once daily. She is tolerating it quite well. Her plt count is 316 today. She is tolerating the medication well.Reduced the dosage of Hydrea 500 mg 3 times a week since 07/17/2014, reducing the dosage of Hydrea to 500 mg twice a week from 08/03/2016and back up to 3 times a week starting 01/18/2015, reduced to twice a week on 04/19/2015 and to once a week on 10/17/2015 ( due to decrease in hemoglobin)  Hydrea toxicities: 1. Fatigue  2. Hair loss  3. brittle nails   I reviewed her counts and the platelet count is 415. We are reducing hydroxyurea to once a week  Irritable bowel syndrome with diarrhea: Patient is also having issues with irritable bowel syndrome which is limiting her day-to-day activities.  Normocytic anemia: Hemoglobin is 9 today. I suspect this is related to hydroxyurea + iron deficiency anemia. We are waiting for iron studies to come back. We will give her IV iron treatment if she is found to be iron deficient.  Return to clinic in 3 months for lab: Check and follow-up.

## 2016-01-16 ENCOUNTER — Telehealth: Payer: Self-pay | Admitting: Hematology and Oncology

## 2016-01-16 ENCOUNTER — Encounter: Payer: Self-pay | Admitting: Hematology and Oncology

## 2016-01-16 ENCOUNTER — Other Ambulatory Visit (HOSPITAL_BASED_OUTPATIENT_CLINIC_OR_DEPARTMENT_OTHER): Payer: Medicare Other

## 2016-01-16 ENCOUNTER — Ambulatory Visit (HOSPITAL_BASED_OUTPATIENT_CLINIC_OR_DEPARTMENT_OTHER): Payer: Medicare Other | Admitting: Hematology and Oncology

## 2016-01-16 VITALS — BP 166/75 | HR 84 | Temp 98.1°F | Resp 18 | Wt 162.3 lb

## 2016-01-16 DIAGNOSIS — K909 Intestinal malabsorption, unspecified: Secondary | ICD-10-CM | POA: Diagnosis not present

## 2016-01-16 DIAGNOSIS — L658 Other specified nonscarring hair loss: Secondary | ICD-10-CM

## 2016-01-16 DIAGNOSIS — D473 Essential (hemorrhagic) thrombocythemia: Secondary | ICD-10-CM

## 2016-01-16 DIAGNOSIS — D508 Other iron deficiency anemias: Secondary | ICD-10-CM | POA: Diagnosis not present

## 2016-01-16 DIAGNOSIS — K580 Irritable bowel syndrome with diarrhea: Secondary | ICD-10-CM

## 2016-01-16 DIAGNOSIS — R5383 Other fatigue: Secondary | ICD-10-CM

## 2016-01-16 DIAGNOSIS — L603 Nail dystrophy: Secondary | ICD-10-CM

## 2016-01-16 LAB — CBC & DIFF AND RETIC
BASO%: 1 % (ref 0.0–2.0)
BASOS ABS: 0.1 10*3/uL (ref 0.0–0.1)
EOS ABS: 1.6 10*3/uL — AB (ref 0.0–0.5)
EOS%: 17.4 % — AB (ref 0.0–7.0)
HEMATOCRIT: 38 % (ref 34.8–46.6)
HEMOGLOBIN: 11.9 g/dL (ref 11.6–15.9)
Immature Retic Fract: 4.9 % (ref 1.60–10.00)
LYMPH%: 9 % — ABNORMAL LOW (ref 14.0–49.7)
MCH: 29.5 pg (ref 25.1–34.0)
MCHC: 31.3 g/dL — AB (ref 31.5–36.0)
MCV: 94.3 fL (ref 79.5–101.0)
MONO#: 0.6 10*3/uL (ref 0.1–0.9)
MONO%: 6.9 % (ref 0.0–14.0)
NEUT#: 6.1 10*3/uL (ref 1.5–6.5)
NEUT%: 65.7 % (ref 38.4–76.8)
PLATELETS: 532 10*3/uL — AB (ref 145–400)
RBC: 4.03 10*6/uL (ref 3.70–5.45)
RDW: 17.8 % — ABNORMAL HIGH (ref 11.2–14.5)
RETIC %: 0.8 % (ref 0.70–2.10)
RETIC CT ABS: 32.24 10*3/uL — AB (ref 33.70–90.70)
WBC: 9.3 10*3/uL (ref 3.9–10.3)
lymph#: 0.8 10*3/uL — ABNORMAL LOW (ref 0.9–3.3)

## 2016-01-16 LAB — FERRITIN: Ferritin: 68 ng/ml (ref 9–269)

## 2016-01-16 LAB — IRON AND TIBC
%SAT: 27 % (ref 21–57)
IRON: 72 ug/dL (ref 41–142)
TIBC: 263 ug/dL (ref 236–444)
UIBC: 191 ug/dL (ref 120–384)

## 2016-01-16 NOTE — Telephone Encounter (Signed)
Gave patient avs report and appointments for February.  °

## 2016-01-16 NOTE — Progress Notes (Signed)
Patient Care Team: Hulan Fess, MD as PCP - General  DIAGNOSIS:  Encounter Diagnoses  Name Primary?  . Essential thrombocytosis ([place])   . Other iron deficiency anemia Yes   DIAGNOSIS: Essential thrombocytosis and hydroxyurea 500 mg twice a week  CHIEF COMPLIANT: fatigue, mild dizziness/vertigo  INTERVAL HISTORY: ALANII PERUSKI is a 75 year old with above-mentioned history of essential thrombocytosis who is currently on hydroxyurea and aspirin therapy.  Previously she was found to have iron deficiency anemia. We gave her IV iron therapy in August 2017. Her energy levels have improved. She continues to have diarrhea related to irritable bowel syndrome. She also hurt her back and it is bothering her. The back and radiates down the leg. She was given prednisone and had a reaction to it.  REVIEW OF SYSTEMS:   Constitutional: Denies fevers, chills or abnormal weight loss Eyes: Denies blurriness of vision Ears, nose, mouth, throat, and face: Denies mucositis or sore throat Respiratory: Denies cough, dyspnea or wheezes Cardiovascular: Denies palpitation, chest discomfort Gastrointestinal:  Denies nausea, heartburn or change in bowel habits Skin: Denies abnormal skin rashes Lymphatics: Denies new lymphadenopathy or easy bruising Neurological:Denies numbness, tingling or new weaknesses Behavioral/Psych: Mood is stable, no new changes  Extremities: No lower extremity edema, complains of back pain that radiates down the right leg  All other systems were reviewed with the patient and are negative.  I have reviewed the past medical history, past surgical history, social history and family history with the patient and they are unchanged from previous note.  ALLERGIES:  is allergic to iodine; prednisone; and shellfish allergy.  MEDICATIONS:  Current Outpatient Prescriptions  Medication Sig Dispense Refill  . acetaminophen (TYLENOL) 325 MG tablet Take 650 mg by mouth daily as needed for  mild pain.    [name] ALPRAZolam (XANAX) 0.25 MG tablet Take 0.5 tablets (0.125 mg total) by mouth 3 (three) times daily as needed for anxiety. 30 tablet 2  . aspirin 325 MG tablet Take 325 mg by mouth daily.     [name] BIOTIN PO Take 1 capsule by mouth daily.    . calcium carbonate (TUMS - DOSED IN MG ELEMENTAL CALCIUM) 500 MG chewable tablet Chew 2 tablets by mouth daily as needed for indigestion or heartburn.    . Cholecalciferol (VITAMIN D) 2000 UNITS CAPS Take 1 capsule by mouth daily.    . Evening Primrose Oil 500 MG CAPS Take 1 capsule by mouth 2 (two) times daily.    . hydroxyurea (HYDREA) 500 MG capsule TAKE 1 CAPSULE (500 MG TOTAL) BY MOUTH AS DIRECTED. TAKE MONDAY WEDNESDAY AND FRIDAY. (Patient taking differently: TAKE 1 CAPSULE (500 MG TOTAL) BY MOUTH AS DIRECTED. TAKE Monday.) 48 capsule 3  . levothyroxine (SYNTHROID, LEVOTHROID) 100 MCG tablet Take 100 mcg by mouth daily.     . metroNIDAZOLE (METROCREAM) 0.75 % cream Apply 1 application topically daily as needed (for rosacea).    . Multiple Vitamin (MULTIVITAMIN) tablet Take 1 tablet by mouth daily.     . nitroGLYCERIN (NITROSTAT) 0.4 MG SL tablet Place 0.4 mg under the tongue every 5 (five) minutes as needed for chest pain.    . ranitidine (ZANTAC) 150 MG capsule Take 150 mg by mouth 2 (two) times daily.     [name] terbinafine (LAMISIL) 250 MG tablet Take 1 tablet (250 mg total) by mouth daily. (Patient taking differently: Take 250 mg by mouth daily. Taking every other day for 2 months.  12-30-15) 30 tablet 0   No current facility-administered  medications for this visit.     PHYSICAL EXAMINATION: ECOG PERFORMANCE STATUS: 1 - Symptomatic but completely ambulatory  Vitals:   01/16/16 0934  BP: (!) 166/75  Pulse: 84  Resp: 18  Temp: 98.1 F (36.7 C)   Filed Weights   01/16/16 0934  Weight: 162 lb 4.8 oz (73.6 kg)    GENERAL:alert, no distress and comfortable SKIN: skin color, texture, turgor are normal, no rashes or significant  lesions EYES: normal, Conjunctiva are pink and non-injected, sclera clear OROPHARYNX:no exudate, no erythema and lips, buccal mucosa, and tongue normal  NECK: supple, thyroid normal size, non-tender, without nodularity LYMPH:  no palpable lymphadenopathy in the cervical, axillary or inguinal LUNGS: clear to auscultation and percussion with normal breathing effort HEART: regular rate & rhythm and no murmurs and no lower extremity edema ABDOMEN:abdomen soft, non-tender and normal bowel sounds MUSCULOSKELETAL:no cyanosis of digits and no clubbing  NEURO: alert & oriented x 3 with fluent speech, no focal motor/sensory deficits EXTREMITIES: No lower extremity edema  LABORATORY DATA:  I have reviewed the data as listed   Chemistry      Component Value Date/Time   NA 140 04/19/2015 1107   K 4.2 04/19/2015 1107   CL 106 11/20/2013 0542   CL 107 06/24/2012 0859   CO2 25 04/19/2015 1107   BUN 21.1 04/19/2015 1107   CREATININE 0.9 04/19/2015 1107      Component Value Date/Time   CALCIUM 9.1 04/19/2015 1107   ALKPHOS 46 08/20/2015 1050   ALKPHOS 50 04/19/2015 1107   AST 13 08/20/2015 1050   AST 14 04/19/2015 1107   ALT 8 08/20/2015 1050   ALT 12 04/19/2015 1107   BILITOT 0.3 08/20/2015 1050   BILITOT <0.30 04/19/2015 1107       Lab Results  Component Value Date   WBC 9.3 01/16/2016   HGB 11.9 01/16/2016   HCT 38.0 01/16/2016   MCV 94.3 01/16/2016   PLT 532 (H) 01/16/2016   NEUTROABS 6.1 01/16/2016     ASSESSMENT & PLAN:  Essential thrombocytosis Essential thrombocytosis with JAK2 mutation: Continue Hydrea 500 mg daily and also aspirin 325 mg by mouth once daily. She is tolerating it quite well. Her plt count is 316 today. She is tolerating the medication well.Reduced the dosage of Hydrea 500 mg 3 times a week since 07/17/2014, reducing the dosage of Hydrea to 500 mg twice a week from 08/03/2016and back up to 3 times a week starting 01/18/2015, reduced to twice a week on  04/19/2015 and to once a week on 10/17/2015 ( due to decrease in hemoglobin)  Hydrea toxicities: 1. Fatigue  2. Hair loss  3. brittle nails   I reviewed her counts and the platelet count is 532. She is taking hydroxyurea once a week. I would like her to remain at the same dosage and we'll recheck her numbers in 3 months and follow-up.  Iron deficiency anemia due to malabsorption: IV iron given August 2017. Remarkable improvement in hemoglobin from 9 g to 11.9 g. Energy levels had also improved.  Irritable bowel syndrome with diarrhea: Patient is also having issues with irritable bowel syndrome which is limiting her day-to-day activities.  Return to clinic in 3 months for lab: Check and follow-up.   Orders Placed This Encounter  Procedures  . CBC with Differential    Standing Status:   Future    Standing Expiration Date:   01/15/2017  . Ferritin    Standing Status:   Future  Standing Expiration Date:   01/15/2017  . Iron and TIBC    Standing Status:   Future    Standing Expiration Date:   01/15/2017   The patient has a good understanding of the overall plan. she agrees with it. she will call with any problems that may develop before the next visit here.   Rulon Eisenmenger, MD 01/16/16

## 2016-01-17 LAB — ERYTHROPOIETIN: Erythropoietin: 4.3 m[IU]/mL (ref 2.6–18.5)

## 2016-03-24 ENCOUNTER — Ambulatory Visit (INDEPENDENT_AMBULATORY_CARE_PROVIDER_SITE_OTHER): Payer: Medicare Other | Admitting: Podiatry

## 2016-03-24 ENCOUNTER — Encounter: Payer: Self-pay | Admitting: Podiatry

## 2016-03-24 DIAGNOSIS — L03031 Cellulitis of right toe: Secondary | ICD-10-CM | POA: Diagnosis not present

## 2016-03-24 MED ORDER — MUPIROCIN 2 % EX OINT: TOPICAL_OINTMENT | CUTANEOUS | 1 refills | Status: DC

## 2016-03-24 NOTE — Progress Notes (Signed)
She presents today for follow-up of her onychomycosis and Lamisil treatment. She states that she is doing very well and it appears to be growing out nicely. She does not want to take any more medication she is worried about the possible liver complications. She states that the left hallux is red and swollen.  Objective: Vital signs are stable she is alert and oriented 3. Pulses are palpable. She has nearly 85-90% growing out of her onychomycosis. She does have a distal paronychia where a portion of the nail is ingrowing to the hallux left. I trimmed that for her today. Symptoms were alleviated.  Assessment: Well healing onychomycosis with long-term therapy. Paronychia hallux left.  Plan: Wrote prescription for Bactroban ointment and she will start soaking every other day. If this does not heal in a week she is to notify me immediately.

## 2016-04-07 ENCOUNTER — Ambulatory Visit (INDEPENDENT_AMBULATORY_CARE_PROVIDER_SITE_OTHER): Payer: Medicare Other | Admitting: Podiatry

## 2016-04-07 ENCOUNTER — Encounter: Payer: Self-pay | Admitting: Podiatry

## 2016-04-07 DIAGNOSIS — L03031 Cellulitis of right toe: Secondary | ICD-10-CM | POA: Diagnosis not present

## 2016-04-08 NOTE — Progress Notes (Signed)
She presents today for follow-up of superficial infection distal aspect of the hallux left. She states is still little tender when he does rub the wrong way.  Objective: Vital signs are stable she is alert and oriented 3. Pulses are palpable. This appears to be healing very nicely as currently see no signs of infection and wound appears to be closed.  Assessment: Well healing paronychia hallux left.  Plan: Follow up with me as needed. Encouraged to continue to soak at least every other day and apply small amount of Bactroban ointment every other day.

## 2016-04-15 NOTE — Assessment & Plan Note (Signed)
Essential thrombocytosis with JAK2 mutation: Continue Hydrea 500 mg daily and also aspirin 325 mg by mouth once daily. She is tolerating it quite well. Her plt count is 316 today. She is tolerating the medication well.Reduced the dosage of Hydrea 500 mg 3 times a week since 07/17/2014, reducing the dosage of Hydrea to 500 mg twice a week from 08/03/2016and back up to 3 times a week starting 01/18/2015, reduced to twice a week on 04/19/2015 and toonce a week on 10/17/2015( due to decrease in hemoglobin)  Hydrea toxicities: 1. Fatigue  2. Hair loss  3. brittle nails   I reviewed her counts and the platelet count is 532. She is taking hydroxyurea once a week. I would like her to remain at the same dosage and we'll recheck her numbers in 3 months and follow-up.  Iron deficiency anemia due to malabsorption: IV iron given August 2017. Remarkable improvement in hemoglobin from 9 g to 11.9 g. Energy levels had also improved.  Irritable bowel syndrome with diarrhea: Patient is also having issues with irritable bowel syndrome which is limiting her day-to-day activities.  Return to clinic in 3 months for lab: Check and follow-up.

## 2016-04-16 ENCOUNTER — Other Ambulatory Visit (HOSPITAL_BASED_OUTPATIENT_CLINIC_OR_DEPARTMENT_OTHER): Payer: Medicare Other

## 2016-04-16 ENCOUNTER — Encounter: Payer: Self-pay | Admitting: Hematology and Oncology

## 2016-04-16 ENCOUNTER — Ambulatory Visit (HOSPITAL_BASED_OUTPATIENT_CLINIC_OR_DEPARTMENT_OTHER): Payer: Medicare Other | Admitting: Hematology and Oncology

## 2016-04-16 DIAGNOSIS — K580 Irritable bowel syndrome with diarrhea: Secondary | ICD-10-CM

## 2016-04-16 DIAGNOSIS — D473 Essential (hemorrhagic) thrombocythemia: Secondary | ICD-10-CM

## 2016-04-16 DIAGNOSIS — D508 Other iron deficiency anemias: Secondary | ICD-10-CM

## 2016-04-16 DIAGNOSIS — I10 Essential (primary) hypertension: Secondary | ICD-10-CM

## 2016-04-16 DIAGNOSIS — K909 Intestinal malabsorption, unspecified: Secondary | ICD-10-CM | POA: Diagnosis not present

## 2016-04-16 DIAGNOSIS — L603 Nail dystrophy: Secondary | ICD-10-CM

## 2016-04-16 DIAGNOSIS — I350 Nonrheumatic aortic (valve) stenosis: Secondary | ICD-10-CM

## 2016-04-16 DIAGNOSIS — R5383 Other fatigue: Secondary | ICD-10-CM

## 2016-04-16 DIAGNOSIS — R251 Tremor, unspecified: Secondary | ICD-10-CM

## 2016-04-16 DIAGNOSIS — I7300 Raynaud's syndrome without gangrene: Secondary | ICD-10-CM

## 2016-04-16 DIAGNOSIS — L659 Nonscarring hair loss, unspecified: Secondary | ICD-10-CM

## 2016-04-16 HISTORY — DX: Nonrheumatic aortic (valve) stenosis: I35.0

## 2016-04-16 LAB — CBC & DIFF AND RETIC
BASO%: 1 % (ref 0.0–2.0)
BASOS ABS: 0.1 10*3/uL (ref 0.0–0.1)
EOS ABS: 1.5 10*3/uL — AB (ref 0.0–0.5)
EOS%: 13.6 % — ABNORMAL HIGH (ref 0.0–7.0)
HEMATOCRIT: 37.9 % (ref 34.8–46.6)
HEMOGLOBIN: 12.7 g/dL (ref 11.6–15.9)
IMMATURE RETIC FRACT: 5.5 % (ref 1.60–10.00)
LYMPH%: 9.1 % — AB (ref 14.0–49.7)
MCH: 30 pg (ref 25.1–34.0)
MCHC: 33.5 g/dL (ref 31.5–36.0)
MCV: 89.6 fL (ref 79.5–101.0)
MONO#: 0.6 10*3/uL (ref 0.1–0.9)
MONO%: 5.2 % (ref 0.0–14.0)
NEUT#: 7.8 10*3/uL — ABNORMAL HIGH (ref 1.5–6.5)
NEUT%: 71.1 % (ref 38.4–76.8)
PLATELETS: 629 10*3/uL — AB (ref 145–400)
RBC: 4.23 10*6/uL (ref 3.70–5.45)
RDW: 13.6 % (ref 11.2–14.5)
Retic %: 1.25 % (ref 0.70–2.10)
Retic Ct Abs: 52.88 10*3/uL (ref 33.70–90.70)
WBC: 10.9 10*3/uL — ABNORMAL HIGH (ref 3.9–10.3)
lymph#: 1 10*3/uL (ref 0.9–3.3)

## 2016-04-16 LAB — IRON AND TIBC
%SAT: 25 % (ref 21–57)
Iron: 78 ug/dL (ref 41–142)
TIBC: 319 ug/dL (ref 236–444)
UIBC: 241 ug/dL (ref 120–384)

## 2016-04-16 LAB — FERRITIN: FERRITIN: 25 ng/mL (ref 9–269)

## 2016-04-16 NOTE — Progress Notes (Signed)
Patient Care Team: Hulan Fess, MD as PCP - General  DIAGNOSIS:  Encounter Diagnosis  Name Primary?  . Essential thrombocytosis ([facility])    CHIEF COMPLIANT: Follow-up on Hydrea  INTERVAL HISTORY: Deanna Hill is a 76 year old with above-mentioned history of essential thrombocytosis for which she is currently on Hydrea. We had reduced the dose of Hydrea to once a day and it appears that her blood counts are Hydrea daily. The reason for reduced the dosage was because it was causing her to become anemic. Currently her hemoglobin has improved significantly with a lower dosage of Hydrea but we'll also seen an increase in the platelet count. She also complains of brain SYNDROME. She also complains of tremors as well.  REVIEW OF SYSTEMS:   Constitutional: Denies fevers, chills or abnormal weight loss Eyes: Denies blurriness of vision Ears, nose, mouth, throat, and face: Denies mucositis or sore throat Respiratory: Denies cough, dyspnea or wheezes Cardiovascular: Denies palpitation, chest discomfort Gastrointestinal:  Denies nausea, heartburn or change in bowel habits Skin: Denies abnormal skin rashes Lymphatics: Denies new lymphadenopathy or easy bruising Neurological:Denies numbness, tingling or new weaknesses, complains of tremors Behavioral/Psych: Mood is stable, no new changes  Extremities: No lower extremity edema  All other systems were reviewed with the patient and are negative.  I have reviewed the past medical history, past surgical history, social history and family history with the patient and they are unchanged from previous note.  ALLERGIES:  is allergic to iodine; prednisone; and shellfish allergy.  MEDICATIONS:  Current Outpatient Prescriptions  Medication Sig Dispense Refill  . acetaminophen (TYLENOL) 325 MG tablet Take 650 mg by mouth daily as needed for mild pain.    [name] ALPRAZolam (XANAX) 0.25 MG tablet Take 0.5 tablets (0.125 mg total) by mouth 3 (three) times  daily as needed for anxiety. 30 tablet 2  . aspirin 325 MG tablet Take 325 mg by mouth daily.     [name] BIOTIN PO Take 1 capsule by mouth daily.    . calcium carbonate (TUMS - DOSED IN MG ELEMENTAL CALCIUM) 500 MG chewable tablet Chew 2 tablets by mouth daily as needed for indigestion or heartburn.    . Cholecalciferol (VITAMIN D) 2000 UNITS CAPS Take 1 capsule by mouth daily.    . Evening Primrose Oil 500 MG CAPS Take 1 capsule by mouth 2 (two) times daily.    . hydroxyurea (HYDREA) 500 MG capsule TAKE 1 CAPSULE (500 MG TOTAL) BY MOUTH AS DIRECTED. TAKE MONDAY WEDNESDAY AND FRIDAY. (Patient taking differently: TAKE 1 CAPSULE (500 MG TOTAL) BY MOUTH AS DIRECTED. TAKE Monday.) 48 capsule 3  . levothyroxine (SYNTHROID, LEVOTHROID) 100 MCG tablet Take 100 mcg by mouth daily.     . metroNIDAZOLE (METROCREAM) 0.75 % cream Apply 1 application topically daily as needed (for rosacea).    . Multiple Vitamin (MULTIVITAMIN) tablet Take 1 tablet by mouth daily.     . mupirocin ointment (BACTROBAN) 2 % Apply to wound after soaking BID 30 g 1  . nitroGLYCERIN (NITROSTAT) 0.4 MG SL tablet Place 0.4 mg under the tongue every 5 (five) minutes as needed for chest pain.    . ranitidine (ZANTAC) 150 MG capsule Take 150 mg by mouth 2 (two) times daily.      No current facility-administered medications for this visit.     PHYSICAL EXAMINATION: ECOG PERFORMANCE STATUS: 1 - Symptomatic but completely ambulatory  Vitals:   04/16/16 1044  BP: (!) 165/72  Pulse: 92  Resp: 18  Temp:  97.8 F (36.6 C)   Filed Weights   04/16/16 1044  Weight: 161 lb 14.4 oz (73.4 kg)    GENERAL:alert, no distress and comfortable SKIN: skin color, texture, turgor are normal, no rashes or significant lesions EYES: normal, Conjunctiva are pink and non-injected, sclera clear OROPHARYNX:no exudate, no erythema and lips, buccal mucosa, and tongue normal  NECK: supple, thyroid normal size, non-tender, without nodularity LYMPH:  no  palpable lymphadenopathy in the cervical, axillary or inguinal LUNGS: clear to auscultation and percussion with normal breathing effort HEART: regular rate & rhythm and no murmurs and no lower extremity edema ABDOMEN:abdomen soft, non-tender and normal bowel sounds MUSCULOSKELETAL:no cyanosis of digits and no clubbing  NEURO: alert & oriented x 3 with fluent speech, no focal motor/sensory deficits EXTREMITIES: No lower extremity edema, Has Raynaud's syndrome with redness of her hands  LABORATORY DATA:  I have reviewed the data as listed   Chemistry      Component Value Date/Time   NA 140 04/19/2015 1107   K 4.2 04/19/2015 1107   CL 106 11/20/2013 0542   CL 107 06/24/2012 0859   CO2 25 04/19/2015 1107   BUN 21.1 04/19/2015 1107   CREATININE 0.9 04/19/2015 1107      Component Value Date/Time   CALCIUM 9.1 04/19/2015 1107   ALKPHOS 46 08/20/2015 1050   ALKPHOS 50 04/19/2015 1107   AST 13 08/20/2015 1050   AST 14 04/19/2015 1107   ALT 8 08/20/2015 1050   ALT 12 04/19/2015 1107   BILITOT 0.3 08/20/2015 1050   BILITOT <0.30 04/19/2015 1107       Lab Results  Component Value Date   WBC 10.9 (H) 04/16/2016   HGB 12.7 04/16/2016   HCT 37.9 04/16/2016   MCV 89.6 04/16/2016   PLT 629 (H) 04/16/2016   NEUTROABS 7.8 (H) 04/16/2016    ASSESSMENT & PLAN:  Essential thrombocytosis Essential thrombocytosis with JAK2 mutation: Continue Hydrea 500 mg daily and also aspirin 325 mg by mouth once daily. She is tolerating it quite well. Her plt count is 316 today. She is tolerating the medication well.Reduced the dosage of Hydrea 500 mg 3 times a week since 07/17/2014, reducing the dosage of Hydrea to 500 mg twice a week from 08/03/2016and back up to 3 times a week starting 01/18/2015, reduced to twice a week on 04/19/2015 and toonce a week on 10/17/2015( due to decrease in hemoglobin)  Hydrea toxicities: 1. Fatigue  2. Hair loss  3. brittle nails   I reviewed her counts and  the platelet count is 629. She is taking hydroxyurea once a week. I would like her to increase the dosage to twice a day and see her in 3 months for follow-up.  Iron deficiency anemia due to malabsorption: IV iron given August 2017.  Irritable bowel syndrome with diarrhea: Patient is also having issues with irritable bowel syndrome which is limiting her day-to-day activities.  Tremors, Raynaud's syndrome, hypertension: I asked her to discuss with her primary care physician if calcium channel blockers would be of benefit to tactile all 3 problems.  Return to clinic in 3 months for lab: Check and follow-up.   I spent 25 minutes talking to the patient of which more than half was spent in counseling and coordination of care.  No orders of the defined types were placed in this encounter.  The patient has a good understanding of the overall plan. she agrees with it. she will call with any problems that may develop before  the next visit here.   Rulon Eisenmenger, MD 04/16/16

## 2016-04-17 ENCOUNTER — Inpatient Hospital Stay (HOSPITAL_COMMUNITY): Payer: Medicare Other

## 2016-04-17 ENCOUNTER — Inpatient Hospital Stay (HOSPITAL_COMMUNITY)
Admission: EM | Admit: 2016-04-17 | Discharge: 2016-04-22 | DRG: 064 | Disposition: A | Discharge status: Home Health | Payer: Medicare Other | Attending: Internal Medicine | Admitting: Internal Medicine

## 2016-04-17 ENCOUNTER — Emergency Department (HOSPITAL_COMMUNITY): Payer: Medicare Other

## 2016-04-17 DIAGNOSIS — F419 Anxiety disorder, unspecified: Secondary | ICD-10-CM | POA: Diagnosis present

## 2016-04-17 DIAGNOSIS — I6310 Cerebral infarction due to embolism of unspecified precerebral artery: Secondary | ICD-10-CM

## 2016-04-17 DIAGNOSIS — H5347 Heteronymous bilateral field defects: Secondary | ICD-10-CM | POA: Diagnosis present

## 2016-04-17 DIAGNOSIS — Z79899 Other long term (current) drug therapy: Secondary | ICD-10-CM

## 2016-04-17 DIAGNOSIS — D473 Essential (hemorrhagic) thrombocythemia: Secondary | ICD-10-CM | POA: Diagnosis present

## 2016-04-17 DIAGNOSIS — I951 Orthostatic hypotension: Secondary | ICD-10-CM | POA: Diagnosis present

## 2016-04-17 DIAGNOSIS — D509 Iron deficiency anemia, unspecified: Secondary | ICD-10-CM | POA: Diagnosis present

## 2016-04-17 DIAGNOSIS — E039 Hypothyroidism, unspecified: Secondary | ICD-10-CM | POA: Diagnosis present

## 2016-04-17 DIAGNOSIS — I169 Hypertensive crisis, unspecified: Secondary | ICD-10-CM | POA: Diagnosis present

## 2016-04-17 DIAGNOSIS — R4702 Dysphasia: Secondary | ICD-10-CM | POA: Diagnosis present

## 2016-04-17 DIAGNOSIS — J189 Pneumonia, unspecified organism: Secondary | ICD-10-CM

## 2016-04-17 DIAGNOSIS — R262 Difficulty in walking, not elsewhere classified: Secondary | ICD-10-CM

## 2016-04-17 DIAGNOSIS — E785 Hyperlipidemia, unspecified: Secondary | ICD-10-CM | POA: Diagnosis present

## 2016-04-17 DIAGNOSIS — R4701 Aphasia: Secondary | ICD-10-CM | POA: Diagnosis present

## 2016-04-17 DIAGNOSIS — G250 Essential tremor: Secondary | ICD-10-CM | POA: Diagnosis present

## 2016-04-17 DIAGNOSIS — I158 Other secondary hypertension: Secondary | ICD-10-CM | POA: Diagnosis present

## 2016-04-17 DIAGNOSIS — I63512 Cerebral infarction due to unspecified occlusion or stenosis of left middle cerebral artery: Secondary | ICD-10-CM | POA: Diagnosis not present

## 2016-04-17 DIAGNOSIS — I6340 Cerebral infarction due to embolism of unspecified cerebral artery: Secondary | ICD-10-CM | POA: Diagnosis present

## 2016-04-17 DIAGNOSIS — G43109 Migraine with aura, not intractable, without status migrainosus: Secondary | ICD-10-CM | POA: Diagnosis present

## 2016-04-17 DIAGNOSIS — R29810 Facial weakness: Secondary | ICD-10-CM | POA: Diagnosis present

## 2016-04-17 DIAGNOSIS — R4182 Altered mental status, unspecified: Secondary | ICD-10-CM | POA: Diagnosis not present

## 2016-04-17 DIAGNOSIS — K219 Gastro-esophageal reflux disease without esophagitis: Secondary | ICD-10-CM | POA: Diagnosis present

## 2016-04-17 DIAGNOSIS — R414 Neurologic neglect syndrome: Secondary | ICD-10-CM | POA: Diagnosis present

## 2016-04-17 DIAGNOSIS — H5340 Unspecified visual field defects: Secondary | ICD-10-CM | POA: Diagnosis present

## 2016-04-17 DIAGNOSIS — I639 Cerebral infarction, unspecified: Secondary | ICD-10-CM | POA: Diagnosis present

## 2016-04-17 DIAGNOSIS — R7989 Other specified abnormal findings of blood chemistry: Secondary | ICD-10-CM | POA: Diagnosis present

## 2016-04-17 DIAGNOSIS — I10 Essential (primary) hypertension: Secondary | ICD-10-CM | POA: Diagnosis present

## 2016-04-17 DIAGNOSIS — I6783 Posterior reversible encephalopathy syndrome: Secondary | ICD-10-CM

## 2016-04-17 DIAGNOSIS — Z7982 Long term (current) use of aspirin: Secondary | ICD-10-CM

## 2016-04-17 LAB — CBC
HCT: 40.5 % (ref 36.0–46.0)
HEMOGLOBIN: 12.9 g/dL (ref 12.0–15.0)
MCH: 29.5 pg (ref 26.0–34.0)
MCHC: 31.9 g/dL (ref 30.0–36.0)
MCV: 92.7 fL (ref 78.0–100.0)
Platelets: 758 10*3/uL — ABNORMAL HIGH (ref 150–400)
RBC: 4.37 MIL/uL (ref 3.87–5.11)
RDW: 13.4 % (ref 11.5–15.5)
WBC: 17.4 10*3/uL — AB (ref 4.0–10.5)

## 2016-04-17 LAB — COMPREHENSIVE METABOLIC PANEL
ALBUMIN: 4.6 g/dL (ref 3.5–5.0)
ALT: 19 U/L (ref 14–54)
AST: 22 U/L (ref 15–41)
Alkaline Phosphatase: 46 U/L (ref 38–126)
Anion gap: 15 (ref 5–15)
BILIRUBIN TOTAL: 0.6 mg/dL (ref 0.3–1.2)
BUN: 22 mg/dL — AB (ref 6–20)
CO2: 22 mmol/L (ref 22–32)
Calcium: 9.5 mg/dL (ref 8.9–10.3)
Chloride: 100 mmol/L — ABNORMAL LOW (ref 101–111)
Creatinine, Ser: 0.79 mg/dL (ref 0.44–1.00)
GFR calc Af Amer: >60 mL/min (ref >60)
GFR calc non Af Amer: >60 mL/min (ref >60)
GLUCOSE: 152 mg/dL — AB (ref 65–99)
POTASSIUM: 3.8 mmol/L (ref 3.5–5.1)
Sodium: 137 mmol/L (ref 135–145)
TOTAL PROTEIN: 7.2 g/dL (ref 6.5–8.1)

## 2016-04-17 LAB — I-STAT TROPONIN, ED: Troponin i, poc: 0.01 ng/mL (ref 0.00–0.08)

## 2016-04-17 LAB — APTT: APTT: 33 s (ref 24–36)

## 2016-04-17 LAB — FOLATE: Folate: >20 ng/mL (ref >3.0)

## 2016-04-17 LAB — CBG MONITORING, ED: Glucose-Capillary: 147 mg/dL — ABNORMAL HIGH (ref 65–99)

## 2016-04-17 LAB — DIFFERENTIAL
BASOS ABS: 0.1 10*3/uL (ref 0.0–0.1)
Basophils Relative: 0 %
EOS ABS: 0 10*3/uL (ref 0.0–0.7)
Eosinophils Relative: 0 %
LYMPHS ABS: 0.8 10*3/uL (ref 0.7–4.0)
Lymphocytes Relative: 4 %
Monocytes Absolute: 0.3 10*3/uL (ref 0.1–1.0)
Monocytes Relative: 2 %
NEUTROS PCT: 94 %
Neutro Abs: 16.3 10*3/uL — ABNORMAL HIGH (ref 1.7–7.7)

## 2016-04-17 LAB — PROTIME-INR
INR: 1.12
Prothrombin Time: 14.4 seconds (ref 11.4–15.2)

## 2016-04-17 LAB — VITAMIN B12: Vitamin B12: 964 pg/mL (ref 232–1245)

## 2016-04-17 MED ORDER — ONDANSETRON HCL 4 MG/2ML IJ SOLN
INTRAMUSCULAR | Status: AC
  Administered 2016-04-17: 4 mg via INTRAVENOUS
  Filled 2016-04-17: qty 2

## 2016-04-17 MED ORDER — NICARDIPINE HCL IN NACL 20-0.86 MG/200ML-% IV SOLN
3.0000 mg/h | INTRAVENOUS | Status: DC
  Administered 2016-04-17: 5 mg/h via INTRAVENOUS
  Filled 2016-04-17: qty 200

## 2016-04-17 MED ORDER — ONDANSETRON HCL 4 MG/2ML IJ SOLN
4.0000 mg | Freq: Once | INTRAMUSCULAR | Status: AC
  Administered 2016-04-17: 4 mg via INTRAVENOUS

## 2016-04-17 MED ORDER — SODIUM CHLORIDE 0.9 % IV SOLN: INTRAVENOUS | Status: DC

## 2016-04-17 NOTE — Consult Note (Signed)
NEURO HOSPITALIST CONSULT NOTE   Requestig physician: Dr. Maryan Rued  Reason for Consult: Acute onset of right hemineglect and speech deficit  History obtained from:  Family and Chart     HPI:                                                                                                                                          Deanna Hill is an 76 y.o. female who presented for assessment of acute confusion with headache and nausea. Symptoms first noticed by her daughter at 7:30 PM on Friday while speaking with her mother over the phone. The patient had difficulty with word finding. On arrival to the ED she was unable to follow simple commands. LKN not known.   Past Medical History:  Diagnosis Date  . Anxiety   . Essential tremor 06/19/2015  . GERD (gastroesophageal reflux disease)   . Hyperlipidemia   . Hypothyroidism   . IBS (irritable bowel syndrome)   . Thrombocytosis ([place]) 03/11/2011    Past Surgical History:  Procedure Laterality Date  . ABDOMINAL HYSTERECTOMY    . CATARACT EXTRACTION    . CHOLECYSTECTOMY, LAPAROSCOPIC    . LEFT HEART CATHETERIZATION WITH CORONARY ANGIOGRAM N/A 06/08/2013   Procedure: LEFT HEART CATHETERIZATION WITH CORONARY ANGIOGRAM;  Surgeon: Jacolyn Reedy, MD;  Location: [facility] CATH LAB;  Service: Cardiovascular;  Laterality: N/A;  . TOE SURGERY    . TONSILLECTOMY      Family History  Problem Relation Age of Onset  . Skin cancer Mother    Social History:  reports that she has never smoked. She has never used smokeless tobacco. She reports that she does not drink alcohol or use drugs.  Allergies  Allergen Reactions  . Iodine Other (See Comments)    Pt states "Deadly"  . Prednisone     Facial flushing and swelling  . Shellfish Allergy Other (See Comments)    " I almost died once"     MEDICATIONS:                                                                                                                      Scheduled: . aspirin  300 mg Rectal Daily   Or  . aspirin  325 mg Oral Daily  .  enoxaparin (LOVENOX) injection  40 mg Subcutaneous Daily     ROS:                                                                                                                                       Unable to obtain due to aphasia.   Blood pressure 170/75, pulse 111, temperature 98.8 F (37.1 C), temperature source Oral, resp. rate 22, SpO2 100 %.   General Examination:                                                                                                      HEENT-  Normocephalic/atraumatic.   Lungs- Respirations unlabored. No gross wheezing.  Extremities- Well-perfused.   Neurological Examination Mental Status: Drowsy, poor attention. Receptive and expressive dysphasia. One to two word utterances in response to questions and stimuli without relevance to situation. Unable to follow most commands. Follows some simple commands after frequent repetition and tactile stimulation. Neglects right sided stimuli but will track briefly to right.  Cranial Nerves: II: Does not respond consistently to visual stimuli in right visual field. PERRL.  III,IV, VI: ptosis not present, extra-ocular motions are conjugate with left gaze preference. Will track briefly to right. V,VII: Mild right facial droop, lower quadrant. Decreased reaction to noxious stimuli over right brow ridge relative to left.  VIII: reacts to auditory stimuli IX,X: Unable to assess XI: Head turned preferentially to left.  XII: Unable to assess - not following command Motor: Decreased spontaneous movement of RUE and RLE without clear weakness in response to noxious stimuli. Left upper and lower extremities react normally to noxious stimuli.  Sensory: Reacts to tactile stimuli on left side more than on right.  Deep Tendon Reflexes: 2+ and symmetric in upper and lower extremities.  Cerebellar: Prominent rest tremor bilaterally, right worse  than left. Does not follow commands for testing of coordination.  Gait: Deferred due to falls risk concerns.   Lab Results: Basic Metabolic Panel:  Recent Labs Lab 04/17/16 2030  NA 137  K 3.8  CL 100*  CO2 22  GLUCOSE 152*  BUN 22*  CREATININE 0.79  CALCIUM 9.5    Liver Function Tests:  Recent Labs Lab 04/17/16 2030  AST 22  ALT 19  ALKPHOS 46  BILITOT 0.6  PROT 7.2  ALBUMIN 4.6   No results for input(s): LIPASE, AMYLASE in the last 168 hours. No results for input(s): AMMONIA in the last 168 hours.  CBC:  Recent Labs Lab 04/16/16  1037 04/17/16 2030  WBC 10.9* 17.4*  NEUTROABS 7.8* 16.3*  HGB 12.7 12.9  HCT 37.9 40.5  MCV 89.6 92.7  PLT 629* 758*    Cardiac Enzymes: No results for input(s): CKTOTAL, CKMB, CKMBINDEX, TROPONINI in the last 168 hours.  Lipid Panel: No results for input(s): CHOL, TRIG, HDL, CHOLHDL, VLDL, LDLCALC in the last 168 hours.  CBG:  Recent Labs Lab 04/17/16 2032  GLUCAP 147*    Microbiology: Results for orders placed or performed during the hospital encounter of 02/13/09  Bone marrow exam     Status: None   Collection Time: 02/13/09  9:30 AM  Result Value Ref Range Status   Bone Marrow Exam SEE PATHOLOGY REPORT [id number] 20  Final  Tissue hybridization (bone marrow)-[facility]     Status: None   Collection Time: 02/13/09  9:30 AM  Result Value Ref Range Status   Tissue hybridization (bone marrow)-[facility]   Final    SEE SEPARATE REPORT Performed at [facility]    Coagulation Studies:  Recent Labs  04/17/16 2030  LABPROT 14.4  INR 1.12    Imaging: Ct Head Wo Contrast  Result Date: 04/17/2016 CLINICAL DATA:  Altered mental status EXAM: CT HEAD WITHOUT CONTRAST TECHNIQUE: Contiguous axial images were obtained from the base of the skull through the vertex without intravenous contrast. COMPARISON:  None. FINDINGS: Brain: No mass lesion, intraparenchymal hemorrhage or extra-axial collection. No evidence of acute cortical  infarct. Brain parenchyma and CSF-containing spaces are normal for age. Vascular: No hyperdense vessel or unexpected calcification. Skull: Normal visualized skull base, calvarium and extracranial soft tissues. Sinuses/Orbits: No sinus fluid levels or advanced mucosal thickening. No mastoid effusion. Normal orbits. IMPRESSION: Normal head CT for age. Electronically Signed   By: Ulyses Jarred M.D.   On: 04/17/2016 21:10    Assessment: 1. Acute onset of global dysphasia and right hemineglect. Overall clinical picture most consistent with acute ischemic stroke involving the left MCA territory (perisylvian cortices and parietal lobe). Hypertensive encephalopathy less likely than HTN as physiological response to stroke.  2. CT head normal for age. No acute abnormality seen.  3. Listed diagnosis of essential tremor. Uncertain if this is the correct dx as her tremor is present at rest as well as with movement and there is R > L asymmetry. DDx also includes Parkinson's disease or a Parkinson's plus syndrome.  4. Hypothyroidism. 5. Thrombocytosis.   Recommendations: 1. Not an IV tPA candidate due to unknown time last normal.  2. MRI brain, MRA of head.  3. Carotid ultrasound.  4. TTE.  5. Continue ASA.  6. Consider starting statin pending results of MRI. 7. Permissive HTN x 24 hours. Treat if SBP is > 220 or DBP > 120.   8. PT/OT/Speech.  Electronically signed: Dr. Kerney Elbe 04/17/2016, 10:21 PM

## 2016-04-17 NOTE — ED Notes (Signed)
Checked CBG 147, RN Kim informed

## 2016-04-17 NOTE — ED Triage Notes (Signed)
Pt's daughter st's approx 7:30 pm tonight she called the pt on the phone  and noticed she was confused.  At this time pt alert but disoriented.  Pt c/o headache and nausea.  Pt having difficulty finding words to form sentences.  Unknown when last seen normal was. Pt unable to follow simple commands

## 2016-04-17 NOTE — ED Provider Notes (Signed)
[facility] DEPT Provider Note   CSN: [id number] Arrival date & time: 04/17/16  2007     History   Chief Complaint Chief Complaint  Patient presents with  . Altered Mental Status    HPI JINORA PERON is a 76 y.o. female.  Patient is a 76 year old female with a history of thrombocytosis, hypothyroidism and hyperlipidemia presenting today with abrupt onset of confusion. Daughter spoke with her mom around 7 last night and she was for normal self. Today around noon or 1:00 she was paying bills and suddenly was unable to finish the task. She apparently went and laid down daughter called her around 5 and she was not acting herself. She seemed confused but could answer some questions. Symptoms continued to worsen on the way to the emergency room. Now patient is unable to recall her name or answer any questions appropriately.   The history is provided by a relative.  Altered Mental Status   This is a new problem. The current episode started 6 to 12 hours ago. The problem has been gradually worsening. Associated symptoms include confusion. Risk factors: Daughter spoke with her mom last night and she seemed to be normal. No recent infections, medication changes patient does not use alcohol or drugs. Her past medical history does not include diabetes, CVA, hypertension, dementia or heart disease.    Past Medical History:  Diagnosis Date  . Anxiety   . Essential tremor 06/19/2015  . GERD (gastroesophageal reflux disease)   . Hyperlipidemia   . Hypothyroidism   . IBS (irritable bowel syndrome)   . Thrombocytosis ([place]) 03/11/2011    Patient Active Problem List   Diagnosis Date Noted  . Cerebral embolism with cerebral infarction 04/17/2016  . Iron deficiency anemia 10/17/2015  . Essential tremor 06/19/2015  . Encounter for long-term (current) use of medications 01/12/2014  . Anemia due to chemotherapy 07/14/2013  . Angina pectoris ([place]) 06/08/2013  . Abnormal cardiovascular stress  test 06/08/2013  . Hypothyroidism   . Hyperlipidemia   . Essential thrombocytosis ([place]) 03/11/2011    Past Surgical History:  Procedure Laterality Date  . ABDOMINAL HYSTERECTOMY    . CATARACT EXTRACTION    . CHOLECYSTECTOMY, LAPAROSCOPIC    . LEFT HEART CATHETERIZATION WITH CORONARY ANGIOGRAM N/A 06/08/2013   Procedure: LEFT HEART CATHETERIZATION WITH CORONARY ANGIOGRAM;  Surgeon: Jacolyn Reedy, MD;  Location: [facility] CATH LAB;  Service: Cardiovascular;  Laterality: N/A;  . TOE SURGERY    . TONSILLECTOMY      OB History    No data available       Home Medications    Prior to Admission medications   Medication Sig Start Date End Date Taking? Authorizing Provider  ALPRAZolam (XANAX) 0.25 MG tablet Take 0.5 tablets (0.125 mg total) by mouth 3 (three) times daily as needed for anxiety. 06/19/15  Yes Kathrynn Ducking, MD  aspirin 325 MG tablet Take 325 mg by mouth daily.    Yes Historical Provider, MD  calcium carbonate (TUMS - DOSED IN MG ELEMENTAL CALCIUM) 500 MG chewable tablet Chew 2 tablets by mouth daily as needed for indigestion or heartburn.   Yes Historical Provider, MD  Cholecalciferol (VITAMIN D) 2000 UNITS CAPS Take 1 capsule by mouth daily.   Yes Historical Provider, MD  Evening Primrose Oil 500 MG CAPS Take 1 capsule by mouth 2 (two) times daily.   Yes Historical Provider, MD  hydroxyurea (HYDREA) 500 MG capsule TAKE 1 CAPSULE (500 MG TOTAL) BY MOUTH AS DIRECTED. TAKE MONDAY  [facility] AND FRIDAY. Patient taking differently: TAKE 1 CAPSULE (500 MG TOTAL) BY MOUTH AS DIRECTED. TAKE Monday. 01/18/15  Yes Nicholas Lose, MD  levothyroxine (SYNTHROID, LEVOTHROID) 100 MCG tablet Take 100 mcg by mouth daily.    Yes Historical Provider, MD  Multiple Vitamin (MULTIVITAMIN) tablet Take 1 tablet by mouth daily.    Yes Historical Provider, MD  nitroGLYCERIN (NITROSTAT) 0.4 MG SL tablet Place 0.4 mg under the tongue every 5 (five) minutes as needed for chest pain.   Yes Historical Provider,  MD  ranitidine (ZANTAC) 150 MG capsule Take 150 mg by mouth 2 (two) times daily.    Yes Historical Provider, MD  acetaminophen (TYLENOL) 325 MG tablet Take 650 mg by mouth daily as needed for mild pain.    Historical Provider, MD  BIOTIN PO Take 1 capsule by mouth daily.    Historical Provider, MD  metroNIDAZOLE (METROCREAM) 0.75 % cream Apply 1 application topically daily as needed (for rosacea).    Historical Provider, MD  mupirocin ointment (BACTROBAN) 2 % Apply to wound after soaking BID Patient not taking: Reported on 04/17/2016 03/24/16   Max Villa Herb, DPM    Family History Family History  Problem Relation Age of Onset  . Skin cancer Mother     Social History Social History  Substance Use Topics  . Smoking status: Never Smoker  . Smokeless tobacco: Never Used  . Alcohol use No     Allergies   Iodine; Prednisone; and Shellfish allergy   Review of Systems Review of Systems  Unable to perform ROS: Mental status change  Psychiatric/Behavioral: Positive for confusion.     Physical Exam Updated Vital Signs BP 170/75   Pulse 111   Temp 98.8 F (37.1 C) (Oral)   Resp 22   SpO2 100%   Physical Exam  Constitutional: She appears well-developed and well-nourished. No distress.  HENT:  Head: Normocephalic and atraumatic.  Mouth/Throat: Oropharynx is clear and moist.  Eyes: Conjunctivae and EOM are normal. Pupils are equal, round, and reactive to light.  Neck: Normal range of motion. Neck supple.  Cardiovascular: Regular rhythm and intact distal pulses.  Tachycardia present.   No murmur heard. Pulmonary/Chest: Effort normal and breath sounds normal. No respiratory distress. She has no wheezes. She has no rales.  Abdominal: Soft. She exhibits no distension. There is no tenderness. There is no rebound and no guarding.  Musculoskeletal: Normal range of motion. She exhibits no edema or tenderness.  Neurological: She is alert. She has normal strength. She is disoriented. No  sensory deficit. Gait normal.  Patient is able to walk appropriately. She does seem to favor her left side but that may be because her daughter is sitting on the left. She is unable to answer any questions appropriately. When asked what her name is she states I don't know. She does have a resting tremor in bilateral upper extremities but no notable weakness. Patient has difficulty following commands.  Skin: Skin is warm and dry. No rash noted. No erythema.  Psychiatric: She has a normal mood and affect. Her behavior is normal.  Nursing note and vitals reviewed.    ED Treatments / Results  Labs (all labs ordered are listed, but only abnormal results are displayed) Labs Reviewed  CBC - Abnormal; Notable for the following:       Result Value   WBC 17.4 (*)    Platelets 758 (*)    All other components within normal limits  DIFFERENTIAL - Abnormal; Notable for  the following:    Neutro Abs 16.3 (*)    All other components within normal limits  COMPREHENSIVE METABOLIC PANEL - Abnormal; Notable for the following:    Chloride 100 (*)    Glucose, Bld 152 (*)    BUN 22 (*)    All other components within normal limits  CBG MONITORING, ED - Abnormal; Notable for the following:    Glucose-Capillary 147 (*)    All other components within normal limits  PROTIME-INR  APTT  I-STAT TROPOININ, ED  I-STAT CHEM 8, ED    EKG  EKG Interpretation  Date/Time:  Friday April 17 2016 20:45:40 EST Ventricular Rate:  111 PR Interval:    QRS Duration: 90 QT Interval:  344 QTC Calculation: 468 R Axis:   -55 Text Interpretation:  Sinus tachycardia LAD, consider left anterior fascicular block RSR' in V1 or V2, right VCD or RVH No significant change since last tracing Confirmed by [facility]  MD, Loree Fee ([id number]) on 04/17/2016 9:19:28 PM       Radiology Ct Head Wo Contrast  Result Date: 04/17/2016 CLINICAL DATA:  Altered mental status EXAM: CT HEAD WITHOUT CONTRAST TECHNIQUE: Contiguous axial images  were obtained from the base of the skull through the vertex without intravenous contrast. COMPARISON:  None. FINDINGS: Brain: No mass lesion, intraparenchymal hemorrhage or extra-axial collection. No evidence of acute cortical infarct. Brain parenchyma and CSF-containing spaces are normal for age. Vascular: No hyperdense vessel or unexpected calcification. Skull: Normal visualized skull base, calvarium and extracranial soft tissues. Sinuses/Orbits: No sinus fluid levels or advanced mucosal thickening. No mastoid effusion. Normal orbits. IMPRESSION: Normal head CT for age. Electronically Signed   By: Ulyses Jarred M.D.   On: 04/17/2016 21:10    Procedures Procedures (including critical care time)  Medications Ordered in ED Medications  nicardipine (CARDENE) 20mg  in 0.86% saline 269ml IV infusion (0.1 mg/ml) (5 mg/hr Intravenous New Bag/Given 04/17/16 2125)  ondansetron (ZOFRAN) injection 4 mg (4 mg Intravenous Given 04/17/16 2100)     Initial Impression / Assessment and Plan / ED Course  I have reviewed the triage vital signs and the nursing notes.  Pertinent labs & imaging results that were available during my care of the patient were reviewed by me and considered in my medical decision making (see chart for details).    Patient is a 76 y/o female with abrupt onset of confusion and hypertension.  Patient is unable to answer any questions appropriately. She cannot even answer her own name. She has no focal deficits such as unilateral weakness or numbness. However it's difficult to tell if she has any visual deficits as she has a hard time following directions. She has had no recent illness or medication changes. Upon arrival here patient's blood pressure was 238/95 and she has been persistently tachycardic in the low 100s. She is afebrile. Patient's CT is negative for acute bleed. Neurology consult to for evaluation due to concern for stroke. Lower suspicion for infectious etiology despite white blood  cell count of 17,000. Patient was started on a Cardene drip for blood pressure control with improvement of blood pressure to 170/75. However patient's symptoms have not improved. Pt evaluated by neurology who feel that she has had a stroke however outside the window for TPA as sx had been going on for 8 hours prior to arrival.  Neurology request admission for stroke work up including MRI/MRA.  CRITICAL CARE Performed by: Blanchie Dessert Total critical care time: 30 minutes Critical care time was exclusive of  separately billable procedures and treating other patients. Critical care was necessary to treat or prevent imminent or life-threatening deterioration. Critical care was time spent personally by me on the following activities: development of treatment plan with patient and/or surrogate as well as nursing, discussions with consultants, evaluation of patient's response to treatment, examination of patient, obtaining history from patient or surrogate, ordering and performing treatments and interventions, ordering and review of laboratory studies, ordering and review of radiographic studies, pulse oximetry and re-evaluation of patient's condition.  Final Clinical Impressions(s) / ED Diagnoses   Final diagnoses:  Cerebrovascular accident (CVA), unspecified mechanism ([place])  Other secondary hypertension    New Prescriptions New Prescriptions   No medications on file     Blanchie Dessert, MD 04/17/16 2245

## 2016-04-18 ENCOUNTER — Observation Stay (HOSPITAL_COMMUNITY): Payer: Medicare Other

## 2016-04-18 ENCOUNTER — Inpatient Hospital Stay (HOSPITAL_COMMUNITY): Payer: Medicare Other

## 2016-04-18 ENCOUNTER — Encounter (HOSPITAL_COMMUNITY): Payer: Self-pay | Admitting: Family Medicine

## 2016-04-18 ENCOUNTER — Encounter (HOSPITAL_COMMUNITY): Payer: Medicare Other

## 2016-04-18 DIAGNOSIS — I10 Essential (primary) hypertension: Secondary | ICD-10-CM | POA: Diagnosis present

## 2016-04-18 DIAGNOSIS — I6789 Other cerebrovascular disease: Secondary | ICD-10-CM | POA: Diagnosis not present

## 2016-04-18 DIAGNOSIS — E039 Hypothyroidism, unspecified: Secondary | ICD-10-CM

## 2016-04-18 DIAGNOSIS — I6783 Posterior reversible encephalopathy syndrome: Secondary | ICD-10-CM

## 2016-04-18 DIAGNOSIS — D473 Essential (hemorrhagic) thrombocythemia: Secondary | ICD-10-CM | POA: Diagnosis not present

## 2016-04-18 DIAGNOSIS — I639 Cerebral infarction, unspecified: Secondary | ICD-10-CM | POA: Diagnosis present

## 2016-04-18 DIAGNOSIS — I158 Other secondary hypertension: Secondary | ICD-10-CM | POA: Diagnosis not present

## 2016-04-18 DIAGNOSIS — I6310 Cerebral infarction due to embolism of unspecified precerebral artery: Secondary | ICD-10-CM | POA: Diagnosis not present

## 2016-04-18 DIAGNOSIS — D509 Iron deficiency anemia, unspecified: Secondary | ICD-10-CM | POA: Diagnosis not present

## 2016-04-18 LAB — URINALYSIS, ROUTINE W REFLEX MICROSCOPIC
BACTERIA UA: NONE SEEN
Bilirubin Urine: NEGATIVE
Glucose, UA: NEGATIVE mg/dL
Hgb urine dipstick: NEGATIVE
Ketones, ur: NEGATIVE mg/dL
Nitrite: NEGATIVE
Protein, ur: NEGATIVE mg/dL
SPECIFIC GRAVITY, URINE: 1.011 (ref 1.005–1.030)
pH: 6 (ref 5.0–8.0)

## 2016-04-18 LAB — CBC
HEMATOCRIT: 37.7 % (ref 36.0–46.0)
HEMOGLOBIN: 12 g/dL (ref 12.0–15.0)
MCH: 29.4 pg (ref 26.0–34.0)
MCHC: 31.8 g/dL (ref 30.0–36.0)
MCV: 92.4 fL (ref 78.0–100.0)
Platelets: 690 10*3/uL — ABNORMAL HIGH (ref 150–400)
RBC: 4.08 MIL/uL (ref 3.87–5.11)
RDW: 13.6 % (ref 11.5–15.5)
WBC: 18.1 10*3/uL — ABNORMAL HIGH (ref 4.0–10.5)

## 2016-04-18 LAB — COMPREHENSIVE METABOLIC PANEL
ALBUMIN: 4 g/dL (ref 3.5–5.0)
ALK PHOS: 43 U/L (ref 38–126)
ALT: 19 U/L (ref 14–54)
ANION GAP: 9 (ref 5–15)
AST: 23 U/L (ref 15–41)
BUN: 18 mg/dL (ref 6–20)
CALCIUM: 8.9 mg/dL (ref 8.9–10.3)
CO2: 26 mmol/L (ref 22–32)
Chloride: 104 mmol/L (ref 101–111)
Creatinine, Ser: 0.78 mg/dL (ref 0.44–1.00)
GFR calc Af Amer: >60 mL/min (ref >60)
GFR calc non Af Amer: >60 mL/min (ref >60)
GLUCOSE: 123 mg/dL — AB (ref 65–99)
Potassium: 3.9 mmol/L (ref 3.5–5.1)
SODIUM: 139 mmol/L (ref 135–145)
Total Bilirubin: 0.8 mg/dL (ref 0.3–1.2)
Total Protein: 6.4 g/dL — ABNORMAL LOW (ref 6.5–8.1)

## 2016-04-18 LAB — LIPID PANEL
Cholesterol: 162 mg/dL (ref 0–200)
HDL: 44 mg/dL (ref >40)
LDL CALC: 106 mg/dL — AB (ref 0–99)
TRIGLYCERIDES: 58 mg/dL (ref <150)
Total CHOL/HDL Ratio: 3.7 RATIO
VLDL: 12 mg/dL (ref 0–40)

## 2016-04-18 LAB — ECHOCARDIOGRAM COMPLETE: Weight: 2578.5 oz

## 2016-04-18 MED ORDER — ASPIRIN 325 MG PO TABS
325.0000 mg | ORAL_TABLET | Freq: Every day | ORAL | Status: DC
  Administered 2016-04-18 – 2016-04-22 (×5): 325 mg via ORAL
  Filled 2016-04-18 (×6): qty 1

## 2016-04-18 MED ORDER — GADOBENATE DIMEGLUMINE 529 MG/ML IV SOLN: 16.0000 mL | Freq: Once | INTRAVENOUS | Status: DC | PRN

## 2016-04-18 MED ORDER — ENOXAPARIN SODIUM 40 MG/0.4ML ~~LOC~~ SOLN
40.0000 mg | Freq: Every day | SUBCUTANEOUS | Status: DC
  Administered 2016-04-18 – 2016-04-22 (×5): 40 mg via SUBCUTANEOUS
  Filled 2016-04-18 (×5): qty 0.4

## 2016-04-18 MED ORDER — LABETALOL HCL 5 MG/ML IV SOLN: 10.0000 mg | Freq: Four times a day (QID) | INTRAVENOUS | Status: DC | PRN

## 2016-04-18 MED ORDER — ACETAMINOPHEN 160 MG/5ML PO SOLN: 650.0000 mg | ORAL | Status: DC | PRN

## 2016-04-18 MED ORDER — HYDRALAZINE HCL 20 MG/ML IJ SOLN: 10.0000 mg | INTRAMUSCULAR | Status: DC | PRN

## 2016-04-18 MED ORDER — LISINOPRIL 5 MG PO TABS
5.0000 mg | ORAL_TABLET | Freq: Every day | ORAL | Status: DC
  Administered 2016-04-18: 5 mg via ORAL
  Filled 2016-04-18: qty 1

## 2016-04-18 MED ORDER — ACETAMINOPHEN 325 MG PO TABS
650.0000 mg | ORAL_TABLET | ORAL | Status: DC | PRN
  Administered 2016-04-19 – 2016-04-20 (×3): 650 mg via ORAL
  Filled 2016-04-18 (×3): qty 2

## 2016-04-18 MED ORDER — SODIUM CHLORIDE 0.9 % IV SOLN
INTRAVENOUS | Status: DC
  Administered 2016-04-18 – 2016-04-22 (×6): via INTRAVENOUS

## 2016-04-18 MED ORDER — ASPIRIN 300 MG RE SUPP: 300.0000 mg | Freq: Every day | RECTAL | Status: DC

## 2016-04-18 MED ORDER — METOPROLOL TARTRATE 25 MG PO TABS
25.0000 mg | ORAL_TABLET | Freq: Two times a day (BID) | ORAL | Status: DC
  Administered 2016-04-18: 25 mg via ORAL
  Filled 2016-04-18: qty 1

## 2016-04-18 MED ORDER — STROKE: EARLY STAGES OF RECOVERY BOOK
Freq: Once | Status: AC
  Administered 2016-04-18: 02:00:00
  Filled 2016-04-18: qty 1

## 2016-04-18 MED ORDER — LEVOTHYROXINE SODIUM 100 MCG PO TABS
100.0000 ug | ORAL_TABLET | Freq: Every day | ORAL | Status: DC
  Administered 2016-04-18 – 2016-04-22 (×5): 100 ug via ORAL
  Filled 2016-04-18 (×5): qty 1

## 2016-04-18 MED ORDER — ACETAMINOPHEN 650 MG RE SUPP: 650.0000 mg | RECTAL | Status: DC | PRN

## 2016-04-18 NOTE — Progress Notes (Signed)
Physical Therapy Evaluation Patient Details Name: Deanna Hill MRN: [id number] DOB: 02-19-1941 Today's Date: 04/18/2016   History of Present Illness  76 yo female admitted through the ED on 04/17/16 with R hemineglect and decreased word finding, with confusion.    Clinical Impression  Patient appears verbally responsive on arrival, agreeable to therapy evaluation.  MIN Guard assist overall for balance and safety.  Shuffling movements, looking at feet, patient reports mild headache and dizziness "tippy" that is persistent, independent of positioning.  Berg Balance Score of 33/56 = Fall risk for balance.  Per chart review, patient seems to have resolving symptoms at this time, may be able to return to home eventually, but will recommend continued PT services acutely.    Follow Up Recommendations Supervision/Assistance - 24 hour;Home health PT (To be determined, If symptoms continue to resolve)    Equipment Recommendations   (To be determined.)    Recommendations for Other Services Speech consult;OT consult     Precautions / Restrictions Precautions Precautions: Fall Restrictions Weight Bearing Restrictions: No      Mobility  Bed Mobility Overal bed mobility: Needs Assistance Bed Mobility: Supine to Sit     Supine to sit: Supervision     General bed mobility comments: Slower speed  Transfers Overall transfer level: Needs assistance Equipment used: None Transfers: Sit to/from Stand Sit to Stand: Min guard         General transfer comment: Started to stand before therapist ready. Reports "tippy" dizziness generally.  Ambulation/Gait Ambulation/Gait assistance: Min guard Ambulation Distance (Feet): 20 Feet Assistive device: 1 person hand held assist;None Gait Pattern/deviations: Shuffle;Decreased stride length;Narrow base of support Gait velocity: Slow Gait velocity interpretation: Below normal speed for age/gender General Gait Details: Slower pace, head forward  looking at feet.  Stairs            Wheelchair Mobility    Modified Rankin (Stroke Patients Only) Modified Rankin (Stroke Patients Only) Pre-Morbid Rankin Score: No symptoms Modified Rankin: Slight disability     Balance Overall balance assessment: Needs assistance   Sitting balance-Leahy Scale: Good     Standing balance support: No upper extremity supported Standing balance-Leahy Scale: Fair   Single Leg Stance - Right Leg: 2 (seconds)   Tandem Stance - Right Leg: 8 (seconds)           Standardized Balance Assessment Standardized Balance Assessment : Berg Balance Test Berg Balance Test Sit to Stand: Able to stand  independently using hands Standing Unsupported: Able to stand 2 minutes with supervision Sitting with Back Unsupported but Feet Supported on Floor or Stool: Able to sit safely and securely 2 minutes Stand to Sit: Controls descent by using hands Transfers: Able to transfer safely, definite need of hands Standing Unsupported with Eyes Closed: Able to stand 10 seconds with supervision Standing Ubsupported with Feet Together: Able to place feet together independently and stand for 1 minute with supervision From Standing, Reach Forward with Outstretched Arm: Can reach forward >5 cm safely (2") From Standing Position, Pick up Object from Floor: Able to pick up shoe, needs supervision From Standing Position, Turn to Look Behind Over each Shoulder: Turn sideways only but maintains balance Turn 360 Degrees: Needs close supervision or verbal cueing Standing Unsupported, Alternately Place Feet on Step/Stool: Able to complete >2 steps/needs minimal assist Standing Unsupported, One Foot in Front: Needs help to step but can hold 15 seconds Standing on One Leg: Tries to lift leg/unable to hold 3 seconds but remains standing independently Total Score:  33         Pertinent Vitals/Pain Pain Assessment: 0-10 Pain Score: 2  Pain Location: Headache Pain Descriptors  / Indicators: Headache Pain Intervention(s): Monitored during session    Home Living Family/patient expects to be discharged to:: Unsure Living Arrangements: Alone Available Help at Discharge: Available PRN/intermittently;Family Type of Home: House Home Access: Stairs to enter Entrance Stairs-Rails: Right Entrance Stairs-Number of Steps: 2-3 Home Layout: One level   Additional Comments: Lives alone, family lives nearby but may not be able to help full time.    Prior Function Level of Independence: Independent               Hand Dominance        Extremity/Trunk Assessment   Upper Extremity Assessment Upper Extremity Assessment: Defer to OT evaluation;Overall [facility] for tasks assessed (Slowed fine motor coordination)    Lower Extremity Assessment Lower Extremity Assessment: Overall WFL for tasks assessed (Motor coordination appears intact.)       Communication   Communication: Receptive difficulties;Expressive difficulties (May need mult. verbal cues, better with visual, flat affect.)  Cognition Arousal/Alertness: Awake/alert Behavior During Therapy: Flat affect (Mild) Overall Cognitive Status: Impaired/Different from baseline Area of Impairment: Memory;Following commands     Memory:  (Mild confusion) Following Commands: Follows multi-step commands inconsistently       General Comments: Mild overall, responds to re-cues or visual cue.    General Comments General comments (skin integrity, edema, etc.): Impaired balance    Exercises     Assessment/Plan    PT Assessment Patient needs continued PT services  PT Problem List Decreased strength;Decreased activity tolerance;Decreased balance;Decreased mobility;Decreased cognition          PT Treatment Interventions Gait training;Stair training;Functional mobility training;Balance training;Neuromuscular re-education;Patient/family education    PT Goals (Current goals can be found in the Care Plan section)   Acute Rehab PT Goals Patient Stated Goal: To get better. PT Goal Formulation: With patient Time For Goal Achievement: 05/02/16 Potential to Achieve Goals: Good    Frequency Min 4X/week   Barriers to discharge Decreased caregiver support Patient not certain if able to have help full time if needed.    Co-evaluation               End of Session Equipment Utilized During Treatment: Gait belt Activity Tolerance: Patient tolerated treatment well Patient left: in bed;with call bell/phone within reach Nurse Communication: Mobility status;Precautions         Time: 0930-1000 PT Time Calculation (min) (ACUTE ONLY): 30 min   Charges:   PT Evaluation $PT Eval Moderate Complexity: 1 Procedure PT Treatments $Neuromuscular Re-education: 8-22 mins   PT G Codes:        Deanna Hill 14-May-2016, 10:29 AM

## 2016-04-18 NOTE — Progress Notes (Signed)
EEG Completed; Results Pending  

## 2016-04-18 NOTE — Progress Notes (Signed)
PROGRESS NOTE    Deanna Hill  [id number] DOB: 12-15-1940 DOA: 04/17/2016 PCP: Gennette Pac, MD    Brief Narrative:  76 y.o. female with a past medical history significant for Hypothyroidism, essential thrombocytosis who presents with acute altered mental status.    Caveat that patient is aphasic and unable to provide her own history, so all history collected from the chart.  Evidently the patient was last normal around 1PM today, she was paying bills somewhere, and suddenly wasn't able to.  It is unclear where she was or how she got home, but her daughter called her around 5PM and felt she could answer some questions but seemed "not herself" and could not consistently answer questions, so her daughter brought her to the ER where her symptoms worsened.  Assessment & Plan:   Principal Problem:   Cerebral embolism with cerebral infarction Active Problems:   Essential thrombocytosis (HCC)   Hypothyroidism, acquired   Iron deficiency anemia   Other secondary hypertension   Stroke (cerebrum) ([place])  1. Possible encephalopathy secondary to HTN:  - MRI neg for CVA. -Daily aspirin 325 mg -Lipids, hemoglobin A1c -Neurology following. EEG ordered -PT/OT/SLP consulted  2. Hypothyroidism:  -Hold levothyroxine until evaluated by SLP  3. Hypertensive crisis:  No previous history of HTN. -No CVA on MRI - Will start lisinopril 5 mg daily with metoprolol 25 mg bid  4. Thrombocytosis:  Platelets 700K   DVT prophylaxis: Lovenox subQ Code Status: Full Family Communication: Pt in room, family at bedside Disposition Plan: Uncertain at this time  Consultants:   neurology  Procedures:     Antimicrobials: Anti-infectives    None       Subjective: No complaints  Objective: Vitals:   04/18/16 0657 04/18/16 0800 04/18/16 1000 04/18/16 1411  BP: (!) 156/53 (!) 159/82 (!) 149/80 (!) 179/77  Pulse: 96 94 92 96  Resp: 20   19  Temp: 97.7 F (36.5 C)   97.9 F  (36.6 C)  TempSrc: Oral   Oral  SpO2: 98% 96% 98% 99%  Weight:        Intake/Output Summary (Last 24 hours) at 04/18/16 1632 Last data filed at 04/18/16 0400  Gross per 24 hour  Intake           306.25 ml  Output                0 ml  Net           306.25 ml   Filed Weights   04/18/16 0100  Weight: 73.1 kg (161 lb 2.5 oz)    Examination:  General exam: Appears calm and comfortable  Respiratory system: Clear to auscultation. Respiratory effort normal. Cardiovascular system: S1 & S2 heard, RRR. Gastrointestinal system: Abdomen is nondistended, soft and nontender. No organomegaly or masses felt. Normal bowel sounds heard. Central nervous system: Alert and oriented. No focal neurological deficits. Extremities: Symmetric 5 x 5 power. Skin: No rashes, lesions Psychiatry: Judgement and insight appear normal. Mood & affect appropriate.   Data Reviewed: I have personally reviewed following labs and imaging studies  CBC:  Recent Labs Lab 04/16/16 1037 04/17/16 2030 04/18/16 0513  WBC 10.9* 17.4* 18.1*  NEUTROABS 7.8* 16.3*  --   HGB 12.7 12.9 12.0  HCT 37.9 40.5 37.7  MCV 89.6 92.7 92.4  PLT 629* 758* [id number]*   Basic Metabolic Panel:  Recent Labs Lab 04/17/16 2030 04/18/16 0513  NA 137 139  K 3.8 3.9  CL 100* 104  CO2 22 26  GLUCOSE 152* 123*  BUN 22* 18  CREATININE 0.79 0.78  CALCIUM 9.5 8.9   GFR: Estimated Creatinine Clearance: 62.5 mL/min (by C-G formula based on SCr of 0.78 mg/dL). Liver Function Tests:  Recent Labs Lab 04/17/16 2030 04/18/16 0513  AST 22 23  ALT 19 19  ALKPHOS 46 43  BILITOT 0.6 0.8  PROT 7.2 6.4*  ALBUMIN 4.6 4.0   No results for input(s): LIPASE, AMYLASE in the last 168 hours. No results for input(s): AMMONIA in the last 168 hours. Coagulation Profile:  Recent Labs Lab 04/17/16 2030  INR 1.12   Cardiac Enzymes: No results for input(s): CKTOTAL, CKMB, CKMBINDEX, TROPONINI in the last 168 hours. BNP (last 3 results) No  results for input(s): PROBNP in the last 8760 hours. HbA1C: No results for input(s): HGBA1C in the last 72 hours. CBG:  Recent Labs Lab 04/17/16 2032  GLUCAP 147*   Lipid Profile:  Recent Labs  04/18/16 0513  CHOL 162  HDL 44  LDLCALC 106*  TRIG 58  CHOLHDL 3.7   Thyroid Function Tests: No results for input(s): TSH, T4TOTAL, FREET4, T3FREE, THYROIDAB in the last 72 hours. Anemia Panel:  Recent Labs  04/16/16 1037 04/16/16 1038  VITAMINB12  --  964  FOLATE  --  >20.0  FERRITIN 25  --   TIBC 319  --   IRON 78  --   RETICCTPCT 1.25  --    Sepsis Labs: No results for input(s): PROCALCITON, LATICACIDVEN in the last 168 hours.  No results found for this or any previous visit (from the past 240 hour(s)).   Radiology Studies: Ct Head Wo Contrast  Result Date: 04/17/2016 CLINICAL DATA:  Altered mental status EXAM: CT HEAD WITHOUT CONTRAST TECHNIQUE: Contiguous axial images were obtained from the base of the skull through the vertex without intravenous contrast. COMPARISON:  None. FINDINGS: Brain: No mass lesion, intraparenchymal hemorrhage or extra-axial collection. No evidence of acute cortical infarct. Brain parenchyma and CSF-containing spaces are normal for age. Vascular: No hyperdense vessel or unexpected calcification. Skull: Normal visualized skull base, calvarium and extracranial soft tissues. Sinuses/Orbits: No sinus fluid levels or advanced mucosal thickening. No mastoid effusion. Normal orbits. IMPRESSION: Normal head CT for age. Electronically Signed   By: Ulyses Jarred M.D.   On: 04/17/2016 21:10   Mr Jodene Nam Head Wo Contrast  Result Date: 04/18/2016 CLINICAL DATA:  Acute onset confusion, last seen normal at noon today. Assess stroke. History of hyperlipidemia, tremor, essential thrombocytosis. EXAM: MRI HEAD WITH AND WITHOUT CONTRAST MRA HEAD WITHOUT CONTRAST MRA NECK WITHOUT AND WITH CONTRAST TECHNIQUE: Multiplanar, multiecho pulse sequences of the brain and  surrounding structures were obtained with and without intravenous contrast. Angiographic images of the Circle of Willis were obtained using MRA technique without intravenous contrast. Angiographic images of the neck were obtained using MRA technique without and with intravenous contrast. Carotid stenosis measurements (when applicable) are obtained utilizing NASCET criteria, using the distal internal carotid diameter as the denominator. CONTRAST:  16 cc MultiHance COMPARISON:  CT HEAD April 17, 2016 at 2102 hours FINDINGS: MRI HEAD FINDINGS INTRACRANIAL CONTENTS: No reduced diffusion to suggest acute ischemia. No susceptibility artifact to suggest hemorrhage. The ventricles and sulci are normal for patient's age. Abnormal enhancing FLAIR T2 hyperintense signal within the LEFT parietal and occipital lobes extending to the cortex associated reduced diffusion in bright ADC values consistent with T2 shine through. Additional patchy supratentorial white matter FLAIR T2 hyperintensities. No masses, mass effect. No abnormal  extra-axial enhancement. RIGHT parietal developmental venous anomaly, benign finding. No abnormal extra-axial fluid collections. No extra-axial masses. VASCULAR: Normal major intracranial vascular flow voids present at skull base. Though not tailored for evaluation, dural venous sinuses appear patent. SKULL AND UPPER CERVICAL SPINE: No abnormal sellar expansion. No suspicious calvarial bone marrow signal. Craniocervical junction maintained. Severe LEFT temporomandibular osteoarthrosis. SINUSES/ORBITS: LEFT maxillary mucosal retention cyst without paranasal sinus air-fluid levels. Mild paranasal sinus mucosal thickening. Mastoid air cells are well aerated. The included ocular globes and orbital contents are non-suspicious. Status post bilateral ocular lens implants. OTHER: None. MRA HEAD FINDINGS ANTERIOR CIRCULATION: Normal flow related enhancement of the included cervical, petrous, cavernous and  supraclinoid internal carotid arteries. Patent anterior communicating artery. Flow related enhancement of the anterior and middle cerebral arteries, including distal segments. Moderate afocal LEFT M2 origin segment. No large vessel occlusion, high-grade stenosis, abnormal luminal irregularity, aneurysm. POSTERIOR CIRCULATION: vertebral artery is dominant. Basilar artery is patent, with normal flow related enhancement of the main branch vessels. Normal flow related enhancement of the posterior cerebral arteries. No large vessel occlusion, high-grade stenosis, abnormal luminal irregularity, aneurysm. ANATOMIC VARIANTS: None. MRA NECK FINDINGS ANTERIOR CIRCULATION: The common carotid arteries are widely patent bilaterally. The carotid bifurcation is patent bilaterally and there is no hemodynamically significant carotid stenosis by NASCET criteria. No evidence for atherosclerosis or flow limiting stenosis of the cervical internal carotid arteries. POSTERIOR CIRCULATION: Bilateral vertebral arteries are patent to the vertebrobasilar junction. No evidence for atherosclerosis or flow limiting stenosis. Source images and MIP image were reviewed. IMPRESSION: MRI HEAD: Abnormal signal LEFT parietal occipital lobe with imaging characteristics of posterior reversible encephalopathic syndrome, less likely status epilepticus. Moderate chronic small vessel ischemic disease. MRA HEAD: No emergent large vessel occlusion. Moderate stenosis LEFT M2 origin. MRA NECK:  Negative. Electronically Signed   By: Elon Alas M.D.   On: 04/18/2016 01:36   Mr Angiogram Neck W Or Wo Contrast  Result Date: 04/18/2016 CLINICAL DATA:  Acute onset confusion, last seen normal at noon today. Assess stroke. History of hyperlipidemia, tremor, essential thrombocytosis. EXAM: MRI HEAD WITH AND WITHOUT CONTRAST MRA HEAD WITHOUT CONTRAST MRA NECK WITHOUT AND WITH CONTRAST TECHNIQUE: Multiplanar, multiecho pulse sequences of the brain and surrounding  structures were obtained with and without intravenous contrast. Angiographic images of the Circle of Willis were obtained using MRA technique without intravenous contrast. Angiographic images of the neck were obtained using MRA technique without and with intravenous contrast. Carotid stenosis measurements (when applicable) are obtained utilizing NASCET criteria, using the distal internal carotid diameter as the denominator. CONTRAST:  16 cc MultiHance COMPARISON:  CT HEAD April 17, 2016 at 2102 hours FINDINGS: MRI HEAD FINDINGS INTRACRANIAL CONTENTS: No reduced diffusion to suggest acute ischemia. No susceptibility artifact to suggest hemorrhage. The ventricles and sulci are normal for patient's age. Abnormal enhancing FLAIR T2 hyperintense signal within the LEFT parietal and occipital lobes extending to the cortex associated reduced diffusion in bright ADC values consistent with T2 shine through. Additional patchy supratentorial white matter FLAIR T2 hyperintensities. No masses, mass effect. No abnormal extra-axial enhancement. RIGHT parietal developmental venous anomaly, benign finding. No abnormal extra-axial fluid collections. No extra-axial masses. VASCULAR: Normal major intracranial vascular flow voids present at skull base. Though not tailored for evaluation, dural venous sinuses appear patent. SKULL AND UPPER CERVICAL SPINE: No abnormal sellar expansion. No suspicious calvarial bone marrow signal. Craniocervical junction maintained. Severe LEFT temporomandibular osteoarthrosis. SINUSES/ORBITS: LEFT maxillary mucosal retention cyst without paranasal sinus air-fluid levels. Mild paranasal sinus  mucosal thickening. Mastoid air cells are well aerated. The included ocular globes and orbital contents are non-suspicious. Status post bilateral ocular lens implants. OTHER: None. MRA HEAD FINDINGS ANTERIOR CIRCULATION: Normal flow related enhancement of the included cervical, petrous, cavernous and supraclinoid  internal carotid arteries. Patent anterior communicating artery. Flow related enhancement of the anterior and middle cerebral arteries, including distal segments. Moderate afocal LEFT M2 origin segment. No large vessel occlusion, high-grade stenosis, abnormal luminal irregularity, aneurysm. POSTERIOR CIRCULATION: vertebral artery is dominant. Basilar artery is patent, with normal flow related enhancement of the main branch vessels. Normal flow related enhancement of the posterior cerebral arteries. No large vessel occlusion, high-grade stenosis, abnormal luminal irregularity, aneurysm. ANATOMIC VARIANTS: None. MRA NECK FINDINGS ANTERIOR CIRCULATION: The common carotid arteries are widely patent bilaterally. The carotid bifurcation is patent bilaterally and there is no hemodynamically significant carotid stenosis by NASCET criteria. No evidence for atherosclerosis or flow limiting stenosis of the cervical internal carotid arteries. POSTERIOR CIRCULATION: Bilateral vertebral arteries are patent to the vertebrobasilar junction. No evidence for atherosclerosis or flow limiting stenosis. Source images and MIP image were reviewed. IMPRESSION: MRI HEAD: Abnormal signal LEFT parietal occipital lobe with imaging characteristics of posterior reversible encephalopathic syndrome, less likely status epilepticus. Moderate chronic small vessel ischemic disease. MRA HEAD: No emergent large vessel occlusion. Moderate stenosis LEFT M2 origin. MRA NECK:  Negative. Electronically Signed   By: Elon Alas M.D.   On: 04/18/2016 01:36   Mr Jeri Cos [id number] Contrast  Result Date: 04/18/2016 CLINICAL DATA:  Acute onset confusion, last seen normal at noon today. Assess stroke. History of hyperlipidemia, tremor, essential thrombocytosis. EXAM: MRI HEAD WITH AND WITHOUT CONTRAST MRA HEAD WITHOUT CONTRAST MRA NECK WITHOUT AND WITH CONTRAST TECHNIQUE: Multiplanar, multiecho pulse sequences of the brain and surrounding structures were obtained  with and without intravenous contrast. Angiographic images of the Circle of Willis were obtained using MRA technique without intravenous contrast. Angiographic images of the neck were obtained using MRA technique without and with intravenous contrast. Carotid stenosis measurements (when applicable) are obtained utilizing NASCET criteria, using the distal internal carotid diameter as the denominator. CONTRAST:  16 cc MultiHance COMPARISON:  CT HEAD April 17, 2016 at 2102 hours FINDINGS: MRI HEAD FINDINGS INTRACRANIAL CONTENTS: No reduced diffusion to suggest acute ischemia. No susceptibility artifact to suggest hemorrhage. The ventricles and sulci are normal for patient's age. Abnormal enhancing FLAIR T2 hyperintense signal within the LEFT parietal and occipital lobes extending to the cortex associated reduced diffusion in bright ADC values consistent with T2 shine through. Additional patchy supratentorial white matter FLAIR T2 hyperintensities. No masses, mass effect. No abnormal extra-axial enhancement. RIGHT parietal developmental venous anomaly, benign finding. No abnormal extra-axial fluid collections. No extra-axial masses. VASCULAR: Normal major intracranial vascular flow voids present at skull base. Though not tailored for evaluation, dural venous sinuses appear patent. SKULL AND UPPER CERVICAL SPINE: No abnormal sellar expansion. No suspicious calvarial bone marrow signal. Craniocervical junction maintained. Severe LEFT temporomandibular osteoarthrosis. SINUSES/ORBITS: LEFT maxillary mucosal retention cyst without paranasal sinus air-fluid levels. Mild paranasal sinus mucosal thickening. Mastoid air cells are well aerated. The included ocular globes and orbital contents are non-suspicious. Status post bilateral ocular lens implants. OTHER: None. MRA HEAD FINDINGS ANTERIOR CIRCULATION: Normal flow related enhancement of the included cervical, petrous, cavernous and supraclinoid internal carotid arteries.  Patent anterior communicating artery. Flow related enhancement of the anterior and middle cerebral arteries, including distal segments. Moderate afocal LEFT M2 origin segment. No large vessel occlusion, high-grade stenosis, abnormal  luminal irregularity, aneurysm. POSTERIOR CIRCULATION: vertebral artery is dominant. Basilar artery is patent, with normal flow related enhancement of the main branch vessels. Normal flow related enhancement of the posterior cerebral arteries. No large vessel occlusion, high-grade stenosis, abnormal luminal irregularity, aneurysm. ANATOMIC VARIANTS: None. MRA NECK FINDINGS ANTERIOR CIRCULATION: The common carotid arteries are widely patent bilaterally. The carotid bifurcation is patent bilaterally and there is no hemodynamically significant carotid stenosis by NASCET criteria. No evidence for atherosclerosis or flow limiting stenosis of the cervical internal carotid arteries. POSTERIOR CIRCULATION: Bilateral vertebral arteries are patent to the vertebrobasilar junction. No evidence for atherosclerosis or flow limiting stenosis. Source images and MIP image were reviewed. IMPRESSION: MRI HEAD: Abnormal signal LEFT parietal occipital lobe with imaging characteristics of posterior reversible encephalopathic syndrome, less likely status epilepticus. Moderate chronic small vessel ischemic disease. MRA HEAD: No emergent large vessel occlusion. Moderate stenosis LEFT M2 origin. MRA NECK:  Negative. Electronically Signed   By: Elon Alas M.D.   On: 04/18/2016 01:36   Dg Chest Port 1 View  Result Date: 04/18/2016 CLINICAL DATA:  Pneumonia. EXAM: PORTABLE CHEST 1 VIEW COMPARISON:  Radiographs of November 20, 2013. FINDINGS: The heart size and mediastinal contours are within normal limits. Both lungs are clear. Atherosclerosis of thoracic aorta is noted. No pneumothorax or pleural effusion is noted. The visualized skeletal structures are unremarkable. IMPRESSION: Aortic atherosclerosis.   No acute cardiopulmonary abnormality seen. Electronically Signed   By: Marijo Conception, M.D.   On: 04/18/2016 09:00    Scheduled Meds: . aspirin  300 mg Rectal Daily   Or  . aspirin  325 mg Oral Daily  . enoxaparin (LOVENOX) injection  40 mg Subcutaneous Daily   Continuous Infusions: . sodium chloride 125 mL/hr at 04/18/16 0157     LOS: 1 day   CHIU, Orpah Melter, MD [facility] Pager [phone]  If 7PM-7AM, please contact night-coverage [email or web address] Password [email or web address] 04/18/2016, 4:32 PM

## 2016-04-18 NOTE — Procedures (Signed)
History: 76 year old female with presentation with acute confusion  Sedation: None  Technique: This is a 21 channel routine scalp EEG performed at the bedside with bipolar and monopolar montages arranged in accordance to the international 10/20 system of electrode placement. One channel was dedicated to EKG recording.   Background: There is some slowing of the posterior dominant rhythm maximal frequency of 7 Hz. There is also mild irregular delta activity which is diffuse but with a focal predominance in the left posterior quadrant. It is well-defined and well-organized. It does appear to be some asymmetry with being better visualized on the right than left. Sleep is not recorded.  Photic stimulation: Physiologic driving is absent  EEG Abnormalities: 1) asymmetric PDR 2) focal predominance of delta in the left posterior quadrant 3) generalized irregular slow activity 4) slow PDR  Clinical Interpretation: This EEG is consistent with a suggestion of a focal cervical dysfunction in the left posterior quadrant in the setting of a generalized non-specific cerebral dysfunction(encephalopathy). There was no seizure or seizure predisposition recorded on this study. Please note that a normal EEG does not preclude the possibility of epilepsy.   Roland Rack, MD [facility] [phone]  If 7pm- 7am, please page neurology on call as listed in [place].

## 2016-04-18 NOTE — Care Management Obs Status (Signed)
[place] NOTIFICATION   Patient Details  Name: Deanna Hill MRN: [id number] Date of Birth: 10/02/1940   Medicare Observation Status Notification Given:  Yes    Dellie Catholic, RN 04/18/2016, 2:44 PM

## 2016-04-18 NOTE — Evaluation (Addendum)
Clinical/Bedside Swallow Evaluation Patient Details  Name: Deanna Hill MRN: [id number] Date of Birth: [birth year]  Today's Date: 04/18/2016 Time:        Past Medical History:  Past Medical History:  Diagnosis Date  . Anxiety   . Essential tremor 06/19/2015  . GERD (gastroesophageal reflux disease)   . Hyperlipidemia   . Hypothyroidism   . IBS (irritable bowel syndrome)   . Thrombocytosis ([place]) 03/11/2011   Past Surgical History:  Past Surgical History:  Procedure Laterality Date  . ABDOMINAL HYSTERECTOMY    . CATARACT EXTRACTION    . CHOLECYSTECTOMY, LAPAROSCOPIC    . LEFT HEART CATHETERIZATION WITH CORONARY ANGIOGRAM N/A 06/08/2013   Procedure: LEFT HEART CATHETERIZATION WITH CORONARY ANGIOGRAM;  Surgeon: Jacolyn Reedy, MD;  Location: [facility] CATH LAB;  Service: Cardiovascular;  Laterality: N/A;  . TOE SURGERY    . TONSILLECTOMY     HPI:  Deanna Hill an 76 y.o.female with past medical history notable for essential tremor, GERD, hypothyroidism, IBS, thrombocytosiswho presented for assessment of acute confusion with headache and nausea. Symptoms first noticed by her daughter at 7:30 PM on Friday while speaking with her mother over the phone. The patient had difficulty with word finding. On arrival to the ED she was unable to follow simple commands. MRI 04/17/16 showed Abnormal signal LEFT parietal occipital lobe with imaging characteristics of posterior reversible encephalopathic syndrome, less likely status epilepticus. Seen for bedside swallowing evaluation. No prior history of dysphagia per chart.   Assessment / Plan / Recommendation Clinical Impression  Patient presents with oropharyngeal swallow which appears clinically within functional limits. No overt signs or symptoms of aspiration noted despite challenging with multiple consecutive boluses of thin liquid via straw. Oral manipulation and control adequate, swallow appears timely for thin liquids, puree and regular  solids. Patient has essential tremor and is noted with mild-moderate hand tremor during bolus retrieval, however self-feeding is functional. Speech is noted to be fluent, with intermittent moderately increased processing time for word retrieval. Patient is alert and oriented to person and situation, presents with mild confusion which does not appear to impact safety for PO intake. Recommend regular diet with thin liquids, medications whole with liquids general aspiration precautions (upright posture, slow rate, small bites and sips). No further SLP needs identified at this time. SLP will s/o.     Aspiration Risk  No limitations    Diet Recommendation Regular;Thin liquid   Liquid Administration via: Cup;Straw Medication Administration: Whole meds with liquid Supervision: Patient able to self feed Compensations: Slow rate;Small sips/bites Postural Changes: Seated upright at 90 degrees    Other  Recommendations Oral Care Recommendations: Oral care BID   Follow up Recommendations 24 hour supervision/assistance      Frequency and Duration            Prognosis        Swallow Study   General Date of Onset: 04/17/16 HPI: Deanna Hill an 76 y.o.female with past medical history notable for essential tremor, GERD, hypothyroidism, IBS, thrombocytosiswho presented for assessment of acute confusion with headache and nausea. Symptoms first noticed by her daughter at 7:30 PM on Friday while speaking with her mother over the phone. The patient had difficulty with word finding. On arrival to the ED she was unable to follow simple commands. MRI 04/17/16 showed Abnormal signal LEFT parietal occipital lobe with imaging characteristics of posterior reversible encephalopathic syndrome, less likely status epilepticus. Seen for bedside swallowing evaluation. No prior history of dysphagia per chart.  Type of Study: Bedside Swallow Evaluation Previous Swallow Assessment: none per chart Diet Prior to  this Study: NPO Temperature Spikes Noted: No Respiratory Status: Room air History of Recent Intubation: No Behavior/Cognition: Alert;Cooperative Oral Cavity Assessment: Within Functional Limits Oral Care Completed by SLP: Yes Oral Cavity - Dentition: Adequate natural dentition Vision: Functional for self-feeding Self-Feeding Abilities: Able to feed self Patient Positioning: Upright in bed Baseline Vocal Quality: Low vocal intensity;Normal Volitional Cough: Strong Volitional Swallow: Able to elicit    Oral/Motor/Sensory Function Overall Oral Motor/Sensory Function: Within functional limits   Ice Chips Ice chips: Within functional limits Presentation: Spoon   Thin Liquid Thin Liquid: Within functional limits Presentation: Self Fed;Spoon;Straw;Cup    Nectar Thick Nectar Thick Liquid: Not tested   Honey Thick Honey Thick Liquid: Not tested   Puree Puree: Within functional limits Presentation: Spoon;Self Fed   Solid   GO   Solid: Within functional limits Presentation: [place], [place] CF-SLP Speech-Language Pathologist [phone]   Deanna Hill 04/18/2016,9:36 AM

## 2016-04-18 NOTE — Care Management CC44 (Signed)
Condition Code 44 Documentation Completed  Patient Details  Name: Deanna Hill MRN: [id number] Date of Birth: 08/07/1940   Condition Code 44 given:  Yes Patient signature on Condition Code 44 notice:  Yes Documentation of 2 MD's agreement:  Yes Code 44 added to claim:  Yes    Dellie Catholic, RN 04/18/2016, 2:44 PM

## 2016-04-18 NOTE — Progress Notes (Signed)
STROKE TEAM PROGRESS NOTE   HISTORY OF PRESENT ILLNESS (per record)  Deanna Hill is an 76 y.o. female who presented for assessment of acute confusion with headache and nausea. Symptoms first noticed by her daughter at 7:30 PM on Friday while speaking with her mother over the phone. The patient had difficulty with word finding. On arrival to the ED she was unable to follow simple commands. LKN not known.    SUBJECTIVE (INTERVAL HISTORY) Her daughter and son-in-law is at the bedside.  Overall she feels her condition is gradually improving. She still feels "confused" and has some "trouble remembering details   OBJECTIVE Temp:  [97.7 F (36.5 C)-98.8 F (37.1 C)] 97.7 F (36.5 C) (02/03 0657) Pulse Rate:  [94-119] 94 (02/03 0800) Cardiac Rhythm: Normal sinus rhythm (02/03 0737) Resp:  [18-23] 20 (02/03 0657) BP: (156-238)/(53-95) 159/82 (02/03 0800) SpO2:  [94 %-100 %] 96 % (02/03 0800) Weight:  [73.1 kg (161 lb 2.5 oz)] 73.1 kg (161 lb 2.5 oz) (02/03 0100)  CBC:  Recent Labs Lab 04/16/16 1037 04/17/16 2030 04/18/16 0513  WBC 10.9* 17.4* 18.1*  NEUTROABS 7.8* 16.3*  --   HGB 12.7 12.9 12.0  HCT 37.9 40.5 37.7  MCV 89.6 92.7 92.4  PLT 629* 758* 690*    Basic Metabolic Panel:  Recent Labs Lab 04/17/16 2030 04/18/16 0513  NA 137 139  K 3.8 3.9  CL 100* 104  CO2 22 26  GLUCOSE 152* 123*  BUN 22* 18  CREATININE 0.79 0.78  CALCIUM 9.5 8.9    Lipid Panel:    Component Value Date/Time   CHOL 162 04/18/2016 0513   TRIG 58 04/18/2016 0513   HDL 44 04/18/2016 0513   CHOLHDL 3.7 04/18/2016 0513   VLDL 12 04/18/2016 0513   LDLCALC 106 (H) 04/18/2016 0513   HgbA1c: No results found for: HGBA1C Urine Drug Screen: No results found for: LABOPIA, COCAINSCRNUR, LABBENZ, AMPHETMU, THCU, LABBARB    IMAGING  Ct Head Wo Contrast 04/17/2016 Normal head CT for age.  Mr Jodene Nam Head Wo Contrast 04/18/2016  MRI HEAD:  Abnormal signal LEFT parietal occipital lobe with imaging  characteristics of posterior reversible encephalopathic syndrome, less likely status epilepticus. Moderate chronic small vessel ischemic disease.   MRA HEAD:  No emergent large vessel occlusion. Moderate stenosis LEFT M2 origin.   MRA NECK:   Negative.    Portable chest x-ray 1 view 04/18/2016 Aortic atherosclerosis.  No acute cardiopulmonary abnormality seen.  PHYSICAL EXAM  HEENT-  Normocephalic/atraumatic.   Lungs- Respirations unlabored. No gross wheezing.  Extremities- Well-perfused.   Neurological Examination Mental Status: Awake and better attention than yesterday.  Receptive and expressive dysphasia seem improved. Speaking fluently. Follows commands.   Cranial Nerves: II: now responds consistently to visual stimuli in right visual field. But appears to have an incomplete hemianopsia on the right.  PERRL.  III,IV, VI: ptosis not present, extra-ocular motions are conjugate  V,VII: Mild right facial droop, lower quadrant.  VIII: hearing grossly intact IX,X: Able to swallow without trouble XI: intact XII: intact   Motor: generalized weakness.  4/5 throughout   Sensory: Reports intact to LT  Cerebellar: improved tremor at rest  Gait: Deferred due to falls risk concerns.    ASSESSMENT/PLAN Deanna Hill is a 76 y.o. female with history of thrombocytosis, hypothyroidism, hyperlipidemia, essential tremor, and anxiety presenting with confusion, headache, and word finding difficulties. She did not receive IV t-PA due to unclear time of onset.  Possible PRES given  MRI results:   Resultant  Visual field loss and confusion  MRI - Abnormal signal LEFT parietal occipital lobe with imaging characteristics of posterior reversible encephalopathic syndrome, less likely status epilepticus.  MRA - Moderate stenosis LEFT M2 origin.   MRA Neck - negative  Carotid Doppler - MRA neck  2D Echo - pending  LDL - 106  HgbA1c pending  VTE prophylaxis - Lovenox  Diet NPO  time specified  aspirin 325 mg daily prior to admission, now on aspirin 325 mg daily  Patient counseled to be compliant with her antithrombotic medications  Ongoing aggressive stroke risk factor management  Therapy recommendations: - pending  Disposition: Pending  Hypertension  Stable  Permissive hypertension (OK if < 220/120) but gradually normalize in 5-7 days  Long-term BP goal normotensive  Hyperlipidemia  Home meds:  No lipid lowering medications prior to admission.  LDL 106, goal < 70  Add Lipitor 40 mg daily  Continue statin at discharge   Other Stroke Risk Factors  Advanced age   Other Active Problems  Thrombocytosis - 690  Leukocytosis - 18.1 (afebrile) - urinalysis - trace leukocytes otherwise normal - CXR today - unremarkable   ATTENDING NOTE: Patient was seen and examined by me personally. Documentation reflects findings. The laboratory and radiographic studies reviewed by me. ROS completed by me personally and pertinent positives fully documented  Condition: improving  Assessment and plan completed by me personally and fully documented above. Plans/Recommendations include:     Check EEG  Leukocytosis of unclear etiology; I question if patient had an unwitnessed seizure with de-margination of white cells; if yes, the leukocytosis should rapidly improve.  UA and CXR clear to date  Will follow  SIGNED BY: Dr. Laddie Aquas day # 1   To contact Stroke Continuity provider, please refer to [email or web address] After hours, contact General Neurology

## 2016-04-18 NOTE — H&P (Signed)
History and Physical  Patient Name: Deanna Hill     [id number]    DOB: 21-Oct-1940    DOA: 04/17/2016 PCP: Gennette Pac, MD   Patient coming from: Home     Chief Complaint: Altered mental status  HPI: Deanna Hill is a 76 y.o. female with a past medical history significant for Hypothyroidism, essential thrombocytosis who presents with acute altered mental status.    Caveat that patient is aphasic and unable to provide her own history, so all history collected from the chart.  Evidently the patient was last normal around 1PM today, she was paying bills somewhere, and suddenly wasn't able to.  It is unclear where she was or how she got home, but her daughter called her around 5PM and felt she could answer some questions but seemed "not herself" and could not consistently answer questions, so her daughter brought her to the ER where her symptoms worsened.  ED course: -Afebrile, heart rate 115, respirations and pulse ox normal, BP 207/93 -Na 137, K 3.8, Cr 0.79, WBC 17.4K, Hgb 12.9, platelets 700K -Coags normal -Troponin normal -CT head unremarkable -ECG showed sinus tachycardia -She was started on nicardipine drip, evaluated by Neurology who felt she had had stroke, and TRH were asked to admit     Review of systems:  Review of Systems  Unable to perform ROS: Patient nonverbal         Past Medical History:  Diagnosis Date  . Anxiety   . Essential tremor 06/19/2015  . GERD (gastroesophageal reflux disease)   . Hyperlipidemia   . Hypothyroidism   . IBS (irritable bowel syndrome)   . Thrombocytosis ([place]) 03/11/2011    Past Surgical History:  Procedure Laterality Date  . ABDOMINAL HYSTERECTOMY    . CATARACT EXTRACTION    . CHOLECYSTECTOMY, LAPAROSCOPIC    . LEFT HEART CATHETERIZATION WITH CORONARY ANGIOGRAM N/A 06/08/2013   Procedure: LEFT HEART CATHETERIZATION WITH CORONARY ANGIOGRAM;  Surgeon: Jacolyn Reedy, MD;  Location: [facility] CATH LAB;  Service:  Cardiovascular;  Laterality: N/A;  . TOE SURGERY    . TONSILLECTOMY      Social History: Patient lives alone.  Patient walks unassisted.  She is independent with all ADLs and IADLs reportedly at baseline.    Allergies  Allergen Reactions  . Iodine Other (See Comments)    Pt states "Deadly"  . Prednisone     Facial flushing and swelling  . Shellfish Allergy Other (See Comments)    " I almost died once"     Family history: family history includes Skin cancer in her mother.  Prior to Admission medications   Medication Sig Start Date End Date Taking? Authorizing Provider  ALPRAZolam (XANAX) 0.25 MG tablet Take 0.5 tablets (0.125 mg total) by mouth 3 (three) times daily as needed for anxiety. 06/19/15  Yes Kathrynn Ducking, MD  aspirin 325 MG tablet Take 325 mg by mouth daily.    Yes Historical Provider, MD  calcium carbonate (TUMS - DOSED IN MG ELEMENTAL CALCIUM) 500 MG chewable tablet Chew 2 tablets by mouth daily as needed for indigestion or heartburn.   Yes Historical Provider, MD  Cholecalciferol (VITAMIN D) 2000 UNITS CAPS Take 1 capsule by mouth daily.   Yes Historical Provider, MD  Evening Primrose Oil 500 MG CAPS Take 1 capsule by mouth 2 (two) times daily.   Yes Historical Provider, MD  hydroxyurea (HYDREA) 500 MG capsule TAKE 1 CAPSULE (500 MG TOTAL) BY MOUTH AS DIRECTED. TAKE MONDAY WEDNESDAY  AND FRIDAY. Patient taking differently: TAKE 1 CAPSULE (500 MG TOTAL) BY MOUTH AS DIRECTED. TAKE Monday. 01/18/15  Yes Nicholas Lose, MD  levothyroxine (SYNTHROID, LEVOTHROID) 100 MCG tablet Take 100 mcg by mouth daily.    Yes Historical Provider, MD  Multiple Vitamin (MULTIVITAMIN) tablet Take 1 tablet by mouth daily.    Yes Historical Provider, MD  nitroGLYCERIN (NITROSTAT) 0.4 MG SL tablet Place 0.4 mg under the tongue every 5 (five) minutes as needed for chest pain.   Yes Historical Provider, MD  ranitidine (ZANTAC) 150 MG capsule Take 150 mg by mouth 2 (two) times daily.    Yes Historical  Provider, MD  acetaminophen (TYLENOL) 325 MG tablet Take 650 mg by mouth daily as needed for mild pain.    Historical Provider, MD  BIOTIN PO Take 1 capsule by mouth daily.    Historical Provider, MD  metroNIDAZOLE (METROCREAM) 0.75 % cream Apply 1 application topically daily as needed (for rosacea).    Historical Provider, MD     Physical Exam: BP 170/58   Pulse 119   Temp 98.8 F (37.1 C) (Oral)   Resp 23   SpO2 94%  General appearance: Well-developed, elderly adult female, awake, appears alert.    Eyes: Anicteric, conjunctiva pink, lids and lashes normal. PERRL.    ENT: No nasal deformity, discharge, epistaxis.  Hearing unable to assess. OP moist without lesions.   Dentition normal. Lymph: No cervical, supraclavicular or axillary lymphadenopathy. Skin: Warm and dry.  No jaundice.  No suspicious rashes or lesions. Cardiac: Tachcyardic, regular nl S1-S2, no murmurs appreciated.  Capillary refill is brisk.  JVP normal.  No LE edema.  Radial and DP pulses 2+ and symmetric.  No carotid bruits. Respiratory: Normal respiratory rate and rhythm.  CTAB without rales or wheezes. GI: Abdomen soft without rigidity.  No TTP. No ascites, distension, no hepatosplenomegaly.   MSK: No deformities or effusions. Neuro: Pupils are 4 mm and reactive to 3 mm.  Face appears symmetric.  Tongue midline.  Other nerves unable to test as patient can only follow some simple commands, not all.  Can form sentences, but cannot name where she is, what happened, how she feels.  I do not note a deficit in motor strength testing in the upper and lower extremities bilaterally with normal motor, tone and bulk. But again, they are hard to test.  Speech is fluent. Naming is impaired. Attention span and concentration unable to assess.   Psych: Unable to assess.    Labs on Admission:  I have personally reviewed following labs and imaging studies: CBC:  Recent Labs Lab 04/16/16 1037 04/17/16 2030  WBC 10.9* 17.4*    NEUTROABS 7.8* 16.3*  HGB 12.7 12.9  HCT 37.9 40.5  MCV 89.6 92.7  PLT 629* 123456*   Basic Metabolic Panel:  Recent Labs Lab 04/17/16 2030  NA 137  K 3.8  CL 100*  CO2 22  GLUCOSE 152*  BUN 22*  CREATININE 0.79  CALCIUM 9.5   GFR: Estimated Creatinine Clearance: 62.5 mL/min (by C-G formula based on SCr of 0.79 mg/dL). Liver Function Tests:  Recent Labs Lab 04/17/16 2030  AST 22  ALT 19  ALKPHOS 46  BILITOT 0.6  PROT 7.2  ALBUMIN 4.6   No results for input(s): LIPASE, AMYLASE in the last 168 hours. No results for input(s): AMMONIA in the last 168 hours. Coagulation Profile:  Recent Labs Lab 04/17/16 2030  INR 1.12   Cardiac Enzymes: No results for input(s): CKTOTAL, CKMB,  CKMBINDEX, TROPONINI in the last 168 hours. BNP (last 3 results) No results for input(s): PROBNP in the last 8760 hours. HbA1C: No results for input(s): HGBA1C in the last 72 hours. CBG:  Recent Labs Lab 04/17/16 2032  GLUCAP 147*   Lipid Profile: No results for input(s): CHOL, HDL, LDLCALC, TRIG, CHOLHDL, LDLDIRECT in the last 72 hours. Thyroid Function Tests: No results for input(s): TSH, T4TOTAL, FREET4, T3FREE, THYROIDAB in the last 72 hours. Anemia Panel:  Recent Labs  04/16/16 1037 04/16/16 1038  VITAMINB12  --  964  FOLATE  --  >20.0  FERRITIN 25  --   TIBC 319  --   IRON 78  --   RETICCTPCT 1.25  --    Sepsis Labs: Invalid input(s): PROCALCITONIN, LACTICIDVEN No results found for this or any previous visit (from the past 240 hour(s)).    Radiological Exams on Admission: Personally reviewed: Ct Head Wo Contrast  Result Date: 04/17/2016 CLINICAL DATA:  Altered mental status EXAM: CT HEAD WITHOUT CONTRAST TECHNIQUE: Contiguous axial images were obtained from the base of the skull through the vertex without intravenous contrast. COMPARISON:  None. FINDINGS: Brain: No mass lesion, intraparenchymal hemorrhage or extra-axial collection. No evidence of acute cortical  infarct. Brain parenchyma and CSF-containing spaces are normal for age. Vascular: No hyperdense vessel or unexpected calcification. Skull: Normal visualized skull base, calvarium and extracranial soft tissues. Sinuses/Orbits: No sinus fluid levels or advanced mucosal thickening. No mastoid effusion. Normal orbits. IMPRESSION: Normal head CT for age. Electronically Signed   By: Ulyses Jarred M.D.   On: 04/17/2016 21:10     EKG: Independently reviewed. Rate 111, QTc 468, RSR' and LAFB.    Assessment/Plan  1. Acute Stroke:  This is new.  MRI pending. -Admit to telemetry -Neuro checks, NIHSS per protocol -Daily aspirin 325 mg -Permissive hypertension for now -Lipids, hemoglobin A1c -Carotid doppler, MRA or CT angiography of head and neck per Neurology, ordered -Echocardiogram ordered -PT/OT/SLP consultation -Consult to Neurology, appreciate recommendations   2. Hypothyroidism:  -Hold levothyroxine until evaluated by SLP  3. HTN:  No previous history of HTN. -Permissive hypertension for now -Labetalol 10 mg IV PRN for SBP > 220 or DBP > 110  4. Thrombocytosis:  Platelets 700K -Restart Hydrea when able to take PO        DVT prophylaxis: Lovenox  Code Status: FULL  Family Communication: None present  Disposition Plan: Anticipate Stroke work up as above and consult to ancillary services.  Expect discharge within 3-4 days. Consults called: Neurology, Dr. Cheral Marker has seen patient. Admission status: Telemetry, INPATIENT status  Core measures: -VTE prophylaxis ordered at time of admission -Aspirin ordered at admission -Atrial fibrillation: not known or previously present -tPA not given because of outside stroke window -Dysphagia screen ordered in ER -Lipids ordered -PT eval ordered     Medical decision making: Patient seen at 1:15 AM on 04/18/2016.  The patient was discussed with Dr. Maryan Rued. What exists of the patient's chart was reviewed in depth and summarized above.   Clinical condition: stable.       Edwin Dada [facility] Pager [phone]

## 2016-04-18 NOTE — Progress Notes (Signed)
Echocardiogram 2D Echocardiogram has been performed.  Aggie Cosier 04/18/2016, 11:02 AM

## 2016-04-19 ENCOUNTER — Observation Stay (HOSPITAL_COMMUNITY): Payer: Medicare Other

## 2016-04-19 ENCOUNTER — Encounter (HOSPITAL_COMMUNITY): Payer: Self-pay | Admitting: *Deleted

## 2016-04-19 DIAGNOSIS — E039 Hypothyroidism, unspecified: Secondary | ICD-10-CM | POA: Diagnosis present

## 2016-04-19 DIAGNOSIS — I63512 Cerebral infarction due to unspecified occlusion or stenosis of left middle cerebral artery: Secondary | ICD-10-CM | POA: Diagnosis present

## 2016-04-19 DIAGNOSIS — R414 Neurologic neglect syndrome: Secondary | ICD-10-CM | POA: Diagnosis present

## 2016-04-19 DIAGNOSIS — I951 Orthostatic hypotension: Secondary | ICD-10-CM | POA: Diagnosis present

## 2016-04-19 DIAGNOSIS — R4182 Altered mental status, unspecified: Secondary | ICD-10-CM | POA: Diagnosis present

## 2016-04-19 DIAGNOSIS — R4702 Dysphasia: Secondary | ICD-10-CM | POA: Diagnosis present

## 2016-04-19 DIAGNOSIS — Z79899 Other long term (current) drug therapy: Secondary | ICD-10-CM | POA: Diagnosis not present

## 2016-04-19 DIAGNOSIS — Z7982 Long term (current) use of aspirin: Secondary | ICD-10-CM | POA: Diagnosis not present

## 2016-04-19 DIAGNOSIS — I6310 Cerebral infarction due to embolism of unspecified precerebral artery: Secondary | ICD-10-CM | POA: Diagnosis not present

## 2016-04-19 DIAGNOSIS — D509 Iron deficiency anemia, unspecified: Secondary | ICD-10-CM | POA: Diagnosis present

## 2016-04-19 DIAGNOSIS — G43109 Migraine with aura, not intractable, without status migrainosus: Secondary | ICD-10-CM | POA: Diagnosis present

## 2016-04-19 DIAGNOSIS — R4701 Aphasia: Secondary | ICD-10-CM | POA: Diagnosis present

## 2016-04-19 DIAGNOSIS — I63113 Cerebral infarction due to embolism of bilateral vertebral arteries: Secondary | ICD-10-CM | POA: Diagnosis not present

## 2016-04-19 DIAGNOSIS — E785 Hyperlipidemia, unspecified: Secondary | ICD-10-CM | POA: Diagnosis present

## 2016-04-19 DIAGNOSIS — H5347 Heteronymous bilateral field defects: Secondary | ICD-10-CM | POA: Diagnosis present

## 2016-04-19 DIAGNOSIS — R7989 Other specified abnormal findings of blood chemistry: Secondary | ICD-10-CM | POA: Diagnosis present

## 2016-04-19 DIAGNOSIS — R262 Difficulty in walking, not elsewhere classified: Secondary | ICD-10-CM | POA: Diagnosis not present

## 2016-04-19 DIAGNOSIS — H5340 Unspecified visual field defects: Secondary | ICD-10-CM | POA: Diagnosis present

## 2016-04-19 DIAGNOSIS — G250 Essential tremor: Secondary | ICD-10-CM | POA: Diagnosis present

## 2016-04-19 DIAGNOSIS — I169 Hypertensive crisis, unspecified: Secondary | ICD-10-CM | POA: Diagnosis present

## 2016-04-19 DIAGNOSIS — F419 Anxiety disorder, unspecified: Secondary | ICD-10-CM | POA: Diagnosis present

## 2016-04-19 DIAGNOSIS — I6300 Cerebral infarction due to thrombosis of unspecified precerebral artery: Secondary | ICD-10-CM | POA: Diagnosis not present

## 2016-04-19 DIAGNOSIS — I6783 Posterior reversible encephalopathy syndrome: Secondary | ICD-10-CM | POA: Diagnosis present

## 2016-04-19 DIAGNOSIS — R29810 Facial weakness: Secondary | ICD-10-CM | POA: Diagnosis present

## 2016-04-19 DIAGNOSIS — D473 Essential (hemorrhagic) thrombocythemia: Secondary | ICD-10-CM | POA: Diagnosis present

## 2016-04-19 DIAGNOSIS — I639 Cerebral infarction, unspecified: Secondary | ICD-10-CM | POA: Diagnosis present

## 2016-04-19 DIAGNOSIS — I158 Other secondary hypertension: Secondary | ICD-10-CM | POA: Diagnosis present

## 2016-04-19 DIAGNOSIS — K219 Gastro-esophageal reflux disease without esophagitis: Secondary | ICD-10-CM | POA: Diagnosis present

## 2016-04-19 LAB — CBC
HCT: 38.5 % (ref 36.0–46.0)
HEMOGLOBIN: 12.1 g/dL (ref 12.0–15.0)
MCH: 29.4 pg (ref 26.0–34.0)
MCHC: 31.4 g/dL (ref 30.0–36.0)
MCV: 93.7 fL (ref 78.0–100.0)
Platelets: 578 10*3/uL — ABNORMAL HIGH (ref 150–400)
RBC: 4.11 MIL/uL (ref 3.87–5.11)
RDW: 13.7 % (ref 11.5–15.5)
WBC: 11.1 10*3/uL — ABNORMAL HIGH (ref 4.0–10.5)

## 2016-04-19 LAB — BASIC METABOLIC PANEL
ANION GAP: 7 (ref 5–15)
BUN: 17 mg/dL (ref 6–20)
CALCIUM: 9.1 mg/dL (ref 8.9–10.3)
CO2: 27 mmol/L (ref 22–32)
Chloride: 107 mmol/L (ref 101–111)
Creatinine, Ser: 0.88 mg/dL (ref 0.44–1.00)
GFR calc Af Amer: >60 mL/min (ref >60)
GLUCOSE: 102 mg/dL — AB (ref 65–99)
Potassium: 3.8 mmol/L (ref 3.5–5.1)
Sodium: 141 mmol/L (ref 135–145)

## 2016-04-19 LAB — HEMOGLOBIN A1C
Hgb A1c MFr Bld: 5.7 % — ABNORMAL HIGH (ref 4.8–5.6)
Mean Plasma Glucose: 117 mg/dL

## 2016-04-19 LAB — GLUCOSE, CAPILLARY: GLUCOSE-CAPILLARY: 101 mg/dL — AB (ref 65–99)

## 2016-04-19 LAB — TSH: TSH: 1.259 u[IU]/mL (ref 0.350–4.500)

## 2016-04-19 LAB — T4, FREE: FREE T4: 1.05 ng/dL (ref 0.61–1.12)

## 2016-04-19 MED ORDER — SODIUM CHLORIDE 0.9 % IV BOLUS (SEPSIS)
250.0000 mL | Freq: Once | INTRAVENOUS | Status: AC
  Administered 2016-04-19: 250 mL via INTRAVENOUS

## 2016-04-19 MED ORDER — LISINOPRIL 2.5 MG PO TABS
2.5000 mg | ORAL_TABLET | Freq: Every day | ORAL | Status: DC
  Administered 2016-04-19 – 2016-04-20 (×2): 2.5 mg via ORAL
  Filled 2016-04-19 (×2): qty 1

## 2016-04-19 MED ORDER — ONDANSETRON HCL 4 MG/2ML IJ SOLN: 4.0000 mg | Freq: Four times a day (QID) | INTRAMUSCULAR | Status: DC | PRN

## 2016-04-19 MED ORDER — METOCLOPRAMIDE HCL 5 MG/ML IJ SOLN
5.0000 mg | Freq: Once | INTRAMUSCULAR | Status: AC
  Administered 2016-04-19: 5 mg via INTRAVENOUS
  Filled 2016-04-19: qty 2

## 2016-04-19 MED ORDER — KETOROLAC TROMETHAMINE 15 MG/ML IJ SOLN
15.0000 mg | Freq: Four times a day (QID) | INTRAMUSCULAR | Status: DC | PRN
  Administered 2016-04-19: 15 mg via INTRAVENOUS
  Filled 2016-04-19: qty 1

## 2016-04-19 MED ORDER — ONDANSETRON HCL 4 MG/2ML IJ SOLN: 4.0000 mg | Freq: Four times a day (QID) | INTRAMUSCULAR | Status: DC

## 2016-04-19 NOTE — Progress Notes (Signed)
To CT now, Debbie, RN w/Rapid Response is w/pt.[name]

## 2016-04-19 NOTE — Significant Event (Signed)
Rapid Response Event Note  Overview:  Called by charge RN to assist with patient with neuro changes  Time Called: 0820 Arrival Time: 0825 Event Type: Neurologic  Initial Focused Assessment: On arrival supine in bed - warm and dry - pale - alert and oriented - moves all extremites - Manuela Schwartz RN reports patient had been in chair eating breakfast - went to bathroom and on the way back to bed had episode of right facial droop and right arm weakness.  Patient was placed back in bed - symptoms began to get better.  On my arrival RN reports mild right facial droop remains.  I do not appreciate the right facial droop - she smiles and is equal.  NIHSS 0 on my exam. Has left arm tremor which is reported to be her normal.  BP 127/65 HR 74 SR RR 16 O2 sats 96% on RA - resps regular and unlabored.  CBG 101.   Interventions:  HOB flat.  Chart review shows patient admitted with possible PRES and HTN crisis with stroke like sx.  MRI negative for stroke.  BP last 12 hours was 160's - now 120's.  Per scan report also has small vessel disease.  Hydrated with 100 cc NS.  Sx resolved with HOB down - does complain of moderate headache.  Pupils 61mm ERL.  Dr. Wyline Copas and Dr. Belenda Cruise notified prior to my arrival by [facility].  Taken for stat CT scan head.  NIHSS remains 0 -1 for possible mild right facial droop per Manuela Schwartz RN on return from scan.  BP up some 173/76 HR 80 after movement to CT.  Handoff to [employer].    Plan of Care (if not transferred):  Keep in bed until MD exam - repeat bedside swallow by RN - call RRT as needed.    Event Summary: Name of Physician Notified: Dr. Wyline Copas      at [phone]  Name of Consulting Physician Notified: Dr. Belenda Cruise at [phone]  Outcome: [place] in room and stabalized  Event End Time: 0930  Quin Hoop

## 2016-04-19 NOTE — Progress Notes (Signed)
Back from Ct. B/P up to 173/76. I called daughter, Arrie Aran and updated her on her Mom's condition. Pt c/o head hurting.

## 2016-04-19 NOTE — Progress Notes (Signed)
At 0810 I helped pt to bathroom, she voided, coming back to bed, she became weak w/R arm, dizzy. Got back to bed, see v/s. She had r arm drift then developed r sided facial droop.  Dr Belenda Cruise and Wyline Copas called, made aware. Blood sugar 101. Stat head ct w/o ordered. Debbie w/rapid response here. The facial droop hjas already resolved, IVF started.  CT on their way to get pt.

## 2016-04-19 NOTE — Progress Notes (Signed)
PROGRESS NOTE    Deanna Hill  [id number] DOB: [birth year] DOA: 04/17/2016 PCP: Gennette Pac, MD    Brief Narrative:  76 y.o. female with a past medical history significant for Hypothyroidism, essential thrombocytosis who presents with acute altered mental status.    Caveat that patient is aphasic and unable to provide her own history, so all history collected from the chart.  Evidently the patient was last normal around 1PM today, she was paying bills somewhere, and suddenly wasn't able to.  It is unclear where she was or how she got home, but her daughter called her around 5PM and felt she could answer some questions but seemed "not herself" and could not consistently answer questions, so her daughter brought her to the ER where her symptoms worsened.  Assessment & Plan:   Principal Problem:   Cerebral embolism with cerebral infarction Active Problems:   Essential thrombocytosis (HCC)   Hypothyroidism, acquired   Iron deficiency anemia   Other secondary hypertension   Stroke (cerebrum) (HCC)   PRES (posterior reversible encephalopathy syndrome)  1. Possible encephalopathy secondary to HTN:  - MRI neg for CVA. -Continued on daily aspirin 325 mg -Lipids, hemoglobin A1c -Neurology following. EEG done -PT/OT/SLP consulted -This AM, pt with recurrent facial droop and near syncope -Head CT done and reviewed - essentially unchanged -Will allow gradual normalization of BP  2. Hypothyroidism:  -continue thyroid replacement as tolerated  3. Hypertensive crisis:  No previous history of HTN. -No CVA on MRI -BP normalized overnight with lisinopril 5mg  and metoprolol 25mg  bid -will cont on lisinopril 2.5mg  daily for now to allow higher BP with plan to gradually normalize BP  4. Thrombocytosis:  Platelets improved to 578 this AM -Repeat CBC in AM  5. Leukocytosis - WBC improved to 11.1 -Repeat CBC in AM   DVT prophylaxis: Lovenox subQ Code Status: Full Family  Communication: Pt in room, family at bedside Disposition Plan: Uncertain at this time  Consultants:   neurology  Procedures:     Antimicrobials: Anti-infectives    None      Subjective: Complains of headache and nausea. Reported facial droop and weakness this AM  Objective: Vitals:   04/19/16 0854 04/19/16 0900 04/19/16 0922 04/19/16 1000  BP: (!) 125/54 (!) 126/58 (!) 173/76   Pulse: 79 78 80   Resp:      Temp:    98.6 F (37 C)  TempSrc:      SpO2: 94% 95% 97%   Weight:       No intake or output data in the 24 hours ending 04/19/16 1418 Filed Weights   04/18/16 0100  Weight: 73.1 kg (161 lb 2.5 oz)    Examination:  General exam: Laying in bed, awake, in nad  Respiratory system: normal chest rise, no audible wheezing Cardiovascular system: regular rate, s1, s2 Gastrointestinal system: soft, nondistended, pos BS. Central nervous system: CN2-12 grossly intact, strength intact Extremities: perfused, no clubbing. Skin: Normal skin turgor, no notable skin lesions seen Psychiatry: mood normal// no auditory hallucinations   Data Reviewed: I have personally reviewed following labs and imaging studies  CBC:  Recent Labs Lab 04/16/16 1037 04/17/16 2030 04/18/16 0513 04/19/16 0336  WBC 10.9* 17.4* 18.1* 11.1*  NEUTROABS 7.8* 16.3*  --   --   HGB 12.7 12.9 12.0 12.1  HCT 37.9 40.5 37.7 38.5  MCV 89.6 92.7 92.4 93.7  PLT 629* 758* 690* [id number]*   Basic Metabolic Panel:  Recent Labs Lab 04/17/16 2030 04/18/16 0513  04/19/16 0336  NA 137 139 141  K 3.8 3.9 3.8  CL 100* 104 107  CO2 22 26 27   GLUCOSE 152* 123* 102*  BUN 22* 18 17  CREATININE 0.79 0.78 0.88  CALCIUM 9.5 8.9 9.1   GFR: Estimated Creatinine Clearance: 56.8 mL/min (by C-G formula based on SCr of 0.88 mg/dL). Liver Function Tests:  Recent Labs Lab 04/17/16 2030 04/18/16 0513  AST 22 23  ALT 19 19  ALKPHOS 46 43  BILITOT 0.6 0.8  PROT 7.2 6.4*  ALBUMIN 4.6 4.0   No results for  input(s): LIPASE, AMYLASE in the last 168 hours. No results for input(s): AMMONIA in the last 168 hours. Coagulation Profile:  Recent Labs Lab 04/17/16 2030  INR 1.12   Cardiac Enzymes: No results for input(s): CKTOTAL, CKMB, CKMBINDEX, TROPONINI in the last 168 hours. BNP (last 3 results) No results for input(s): PROBNP in the last 8760 hours. HbA1C:  Recent Labs  04/18/16 0513  HGBA1C 5.7*   CBG:  Recent Labs Lab 04/17/16 2032 04/19/16 0824  GLUCAP 147* 101*   Lipid Profile:  Recent Labs  04/18/16 0513  CHOL 162  HDL 44  LDLCALC 106*  TRIG 58  CHOLHDL 3.7   Thyroid Function Tests:  Recent Labs  04/19/16 0336  TSH 1.259  FREET4 1.05   Anemia Panel: No results for input(s): VITAMINB12, FOLATE, FERRITIN, TIBC, IRON, RETICCTPCT in the last 72 hours. Sepsis Labs: No results for input(s): PROCALCITON, LATICACIDVEN in the last 168 hours.  No results found for this or any previous visit (from the past 240 hour(s)).   Radiology Studies: Ct Head Wo Contrast  Result Date: 04/19/2016 CLINICAL DATA:  Left-sided facial droop and weakness, now resolving. EXAM: CT HEAD WITHOUT CONTRAST TECHNIQUE: Contiguous axial images were obtained from the base of the skull through the vertex without intravenous contrast. COMPARISON:  04/17/2016; Brain MRI - 04/17/2016 FINDINGS: Brain: Scattered periventricular hypodensities compatible microvascular ischemic disease. There is ill-defined blurring involving the left parietal occipital gray-white junction (image 12, series 201), similar to preceding brain MRI. The gray-white differentiation is otherwise well maintained without CT evidence of acute large territory infarct. No intraparenchymal or extra-axial mass or hemorrhage. Normal size a configuration of the ventricles and the basilar cisterns. No midline shift. Vascular: No hyperdense vessel or unexpected calcification. Skull: No displaced calvarial fracture. Sinuses/Orbits: Limited  visualization the paranasal sinuses and mastoid air cells is normal. No air-fluid levels. Post bilateral cataract surgery. Other: Regional soft tissues appear normal. IMPRESSION: 1. Ill-defined blurring involving the gray-white junction within the left parietoccipital lobe, similar to recent brain MRI, favored to represent PRES. 2. Similar findings of mild microvascular ischemic disease. Electronically Signed   By: Sandi Mariscal M.D.   On: 04/19/2016 09:35   Ct Head Wo Contrast  Result Date: 04/17/2016 CLINICAL DATA:  Altered mental status EXAM: CT HEAD WITHOUT CONTRAST TECHNIQUE: Contiguous axial images were obtained from the base of the skull through the vertex without intravenous contrast. COMPARISON:  None. FINDINGS: Brain: No mass lesion, intraparenchymal hemorrhage or extra-axial collection. No evidence of acute cortical infarct. Brain parenchyma and CSF-containing spaces are normal for age. Vascular: No hyperdense vessel or unexpected calcification. Skull: Normal visualized skull base, calvarium and extracranial soft tissues. Sinuses/Orbits: No sinus fluid levels or advanced mucosal thickening. No mastoid effusion. Normal orbits. IMPRESSION: Normal head CT for age. Electronically Signed   By: Ulyses Jarred M.D.   On: 04/17/2016 21:10   Mr Hal Neer Contrast  Result  Date: 04/18/2016 CLINICAL DATA:  Acute onset confusion, last seen normal at noon today. Assess stroke. History of hyperlipidemia, tremor, essential thrombocytosis. EXAM: MRI HEAD WITH AND WITHOUT CONTRAST MRA HEAD WITHOUT CONTRAST MRA NECK WITHOUT AND WITH CONTRAST TECHNIQUE: Multiplanar, multiecho pulse sequences of the brain and surrounding structures were obtained with and without intravenous contrast. Angiographic images of the Circle of Willis were obtained using MRA technique without intravenous contrast. Angiographic images of the neck were obtained using MRA technique without and with intravenous contrast. Carotid stenosis  measurements (when applicable) are obtained utilizing NASCET criteria, using the distal internal carotid diameter as the denominator. CONTRAST:  16 cc MultiHance COMPARISON:  CT HEAD April 17, 2016 at 2102 hours FINDINGS: MRI HEAD FINDINGS INTRACRANIAL CONTENTS: No reduced diffusion to suggest acute ischemia. No susceptibility artifact to suggest hemorrhage. The ventricles and sulci are normal for patient's age. Abnormal enhancing FLAIR T2 hyperintense signal within the LEFT parietal and occipital lobes extending to the cortex associated reduced diffusion in bright ADC values consistent with T2 shine through. Additional patchy supratentorial white matter FLAIR T2 hyperintensities. No masses, mass effect. No abnormal extra-axial enhancement. RIGHT parietal developmental venous anomaly, benign finding. No abnormal extra-axial fluid collections. No extra-axial masses. VASCULAR: Normal major intracranial vascular flow voids present at skull base. Though not tailored for evaluation, dural venous sinuses appear patent. SKULL AND UPPER CERVICAL SPINE: No abnormal sellar expansion. No suspicious calvarial bone marrow signal. Craniocervical junction maintained. Severe LEFT temporomandibular osteoarthrosis. SINUSES/ORBITS: LEFT maxillary mucosal retention cyst without paranasal sinus air-fluid levels. Mild paranasal sinus mucosal thickening. Mastoid air cells are well aerated. The included ocular globes and orbital contents are non-suspicious. Status post bilateral ocular lens implants. OTHER: None. MRA HEAD FINDINGS ANTERIOR CIRCULATION: Normal flow related enhancement of the included cervical, petrous, cavernous and supraclinoid internal carotid arteries. Patent anterior communicating artery. Flow related enhancement of the anterior and middle cerebral arteries, including distal segments. Moderate afocal LEFT M2 origin segment. No large vessel occlusion, high-grade stenosis, abnormal luminal irregularity, aneurysm.  POSTERIOR CIRCULATION: vertebral artery is dominant. Basilar artery is patent, with normal flow related enhancement of the main branch vessels. Normal flow related enhancement of the posterior cerebral arteries. No large vessel occlusion, high-grade stenosis, abnormal luminal irregularity, aneurysm. ANATOMIC VARIANTS: None. MRA NECK FINDINGS ANTERIOR CIRCULATION: The common carotid arteries are widely patent bilaterally. The carotid bifurcation is patent bilaterally and there is no hemodynamically significant carotid stenosis by NASCET criteria. No evidence for atherosclerosis or flow limiting stenosis of the cervical internal carotid arteries. POSTERIOR CIRCULATION: Bilateral vertebral arteries are patent to the vertebrobasilar junction. No evidence for atherosclerosis or flow limiting stenosis. Source images and MIP image were reviewed. IMPRESSION: MRI HEAD: Abnormal signal LEFT parietal occipital lobe with imaging characteristics of posterior reversible encephalopathic syndrome, less likely status epilepticus. Moderate chronic small vessel ischemic disease. MRA HEAD: No emergent large vessel occlusion. Moderate stenosis LEFT M2 origin. MRA NECK:  Negative. Electronically Signed   By: Elon Alas M.D.   On: 04/18/2016 01:36   Mr Angiogram Neck W Or Wo Contrast  Result Date: 04/18/2016 CLINICAL DATA:  Acute onset confusion, last seen normal at noon today. Assess stroke. History of hyperlipidemia, tremor, essential thrombocytosis. EXAM: MRI HEAD WITH AND WITHOUT CONTRAST MRA HEAD WITHOUT CONTRAST MRA NECK WITHOUT AND WITH CONTRAST TECHNIQUE: Multiplanar, multiecho pulse sequences of the brain and surrounding structures were obtained with and without intravenous contrast. Angiographic images of the Circle of Willis were obtained using MRA technique without intravenous contrast. Angiographic images  of the neck were obtained using MRA technique without and with intravenous contrast. Carotid stenosis  measurements (when applicable) are obtained utilizing NASCET criteria, using the distal internal carotid diameter as the denominator. CONTRAST:  16 cc MultiHance COMPARISON:  CT HEAD April 17, 2016 at 2102 hours FINDINGS: MRI HEAD FINDINGS INTRACRANIAL CONTENTS: No reduced diffusion to suggest acute ischemia. No susceptibility artifact to suggest hemorrhage. The ventricles and sulci are normal for patient's age. Abnormal enhancing FLAIR T2 hyperintense signal within the LEFT parietal and occipital lobes extending to the cortex associated reduced diffusion in bright ADC values consistent with T2 shine through. Additional patchy supratentorial white matter FLAIR T2 hyperintensities. No masses, mass effect. No abnormal extra-axial enhancement. RIGHT parietal developmental venous anomaly, benign finding. No abnormal extra-axial fluid collections. No extra-axial masses. VASCULAR: Normal major intracranial vascular flow voids present at skull base. Though not tailored for evaluation, dural venous sinuses appear patent. SKULL AND UPPER CERVICAL SPINE: No abnormal sellar expansion. No suspicious calvarial bone marrow signal. Craniocervical junction maintained. Severe LEFT temporomandibular osteoarthrosis. SINUSES/ORBITS: LEFT maxillary mucosal retention cyst without paranasal sinus air-fluid levels. Mild paranasal sinus mucosal thickening. Mastoid air cells are well aerated. The included ocular globes and orbital contents are non-suspicious. Status post bilateral ocular lens implants. OTHER: None. MRA HEAD FINDINGS ANTERIOR CIRCULATION: Normal flow related enhancement of the included cervical, petrous, cavernous and supraclinoid internal carotid arteries. Patent anterior communicating artery. Flow related enhancement of the anterior and middle cerebral arteries, including distal segments. Moderate afocal LEFT M2 origin segment. No large vessel occlusion, high-grade stenosis, abnormal luminal irregularity, aneurysm.  POSTERIOR CIRCULATION: vertebral artery is dominant. Basilar artery is patent, with normal flow related enhancement of the main branch vessels. Normal flow related enhancement of the posterior cerebral arteries. No large vessel occlusion, high-grade stenosis, abnormal luminal irregularity, aneurysm. ANATOMIC VARIANTS: None. MRA NECK FINDINGS ANTERIOR CIRCULATION: The common carotid arteries are widely patent bilaterally. The carotid bifurcation is patent bilaterally and there is no hemodynamically significant carotid stenosis by NASCET criteria. No evidence for atherosclerosis or flow limiting stenosis of the cervical internal carotid arteries. POSTERIOR CIRCULATION: Bilateral vertebral arteries are patent to the vertebrobasilar junction. No evidence for atherosclerosis or flow limiting stenosis. Source images and MIP image were reviewed. IMPRESSION: MRI HEAD: Abnormal signal LEFT parietal occipital lobe with imaging characteristics of posterior reversible encephalopathic syndrome, less likely status epilepticus. Moderate chronic small vessel ischemic disease. MRA HEAD: No emergent large vessel occlusion. Moderate stenosis LEFT M2 origin. MRA NECK:  Negative. Electronically Signed   By: Elon Alas M.D.   On: 04/18/2016 01:36   Mr Jeri Cos [id number] Contrast  Result Date: 04/18/2016 CLINICAL DATA:  Acute onset confusion, last seen normal at noon today. Assess stroke. History of hyperlipidemia, tremor, essential thrombocytosis. EXAM: MRI HEAD WITH AND WITHOUT CONTRAST MRA HEAD WITHOUT CONTRAST MRA NECK WITHOUT AND WITH CONTRAST TECHNIQUE: Multiplanar, multiecho pulse sequences of the brain and surrounding structures were obtained with and without intravenous contrast. Angiographic images of the Circle of Willis were obtained using MRA technique without intravenous contrast. Angiographic images of the neck were obtained using MRA technique without and with intravenous contrast. Carotid stenosis measurements (when  applicable) are obtained utilizing NASCET criteria, using the distal internal carotid diameter as the denominator. CONTRAST:  16 cc MultiHance COMPARISON:  CT HEAD April 17, 2016 at 2102 hours FINDINGS: MRI HEAD FINDINGS INTRACRANIAL CONTENTS: No reduced diffusion to suggest acute ischemia. No susceptibility artifact to suggest hemorrhage. The ventricles and sulci are normal for patient's age.  Abnormal enhancing FLAIR T2 hyperintense signal within the LEFT parietal and occipital lobes extending to the cortex associated reduced diffusion in bright ADC values consistent with T2 shine through. Additional patchy supratentorial white matter FLAIR T2 hyperintensities. No masses, mass effect. No abnormal extra-axial enhancement. RIGHT parietal developmental venous anomaly, benign finding. No abnormal extra-axial fluid collections. No extra-axial masses. VASCULAR: Normal major intracranial vascular flow voids present at skull base. Though not tailored for evaluation, dural venous sinuses appear patent. SKULL AND UPPER CERVICAL SPINE: No abnormal sellar expansion. No suspicious calvarial bone marrow signal. Craniocervical junction maintained. Severe LEFT temporomandibular osteoarthrosis. SINUSES/ORBITS: LEFT maxillary mucosal retention cyst without paranasal sinus air-fluid levels. Mild paranasal sinus mucosal thickening. Mastoid air cells are well aerated. The included ocular globes and orbital contents are non-suspicious. Status post bilateral ocular lens implants. OTHER: None. MRA HEAD FINDINGS ANTERIOR CIRCULATION: Normal flow related enhancement of the included cervical, petrous, cavernous and supraclinoid internal carotid arteries. Patent anterior communicating artery. Flow related enhancement of the anterior and middle cerebral arteries, including distal segments. Moderate afocal LEFT M2 origin segment. No large vessel occlusion, high-grade stenosis, abnormal luminal irregularity, aneurysm. POSTERIOR CIRCULATION:  vertebral artery is dominant. Basilar artery is patent, with normal flow related enhancement of the main branch vessels. Normal flow related enhancement of the posterior cerebral arteries. No large vessel occlusion, high-grade stenosis, abnormal luminal irregularity, aneurysm. ANATOMIC VARIANTS: None. MRA NECK FINDINGS ANTERIOR CIRCULATION: The common carotid arteries are widely patent bilaterally. The carotid bifurcation is patent bilaterally and there is no hemodynamically significant carotid stenosis by NASCET criteria. No evidence for atherosclerosis or flow limiting stenosis of the cervical internal carotid arteries. POSTERIOR CIRCULATION: Bilateral vertebral arteries are patent to the vertebrobasilar junction. No evidence for atherosclerosis or flow limiting stenosis. Source images and MIP image were reviewed. IMPRESSION: MRI HEAD: Abnormal signal LEFT parietal occipital lobe with imaging characteristics of posterior reversible encephalopathic syndrome, less likely status epilepticus. Moderate chronic small vessel ischemic disease. MRA HEAD: No emergent large vessel occlusion. Moderate stenosis LEFT M2 origin. MRA NECK:  Negative. Electronically Signed   By: Elon Alas M.D.   On: 04/18/2016 01:36   Dg Chest Port 1 View  Result Date: 04/18/2016 CLINICAL DATA:  Pneumonia. EXAM: PORTABLE CHEST 1 VIEW COMPARISON:  Radiographs of November 20, 2013. FINDINGS: The heart size and mediastinal contours are within normal limits. Both lungs are clear. Atherosclerosis of thoracic aorta is noted. No pneumothorax or pleural effusion is noted. The visualized skeletal structures are unremarkable. IMPRESSION: Aortic atherosclerosis.  No acute cardiopulmonary abnormality seen. Electronically Signed   By: Marijo Conception, M.D.   On: 04/18/2016 09:00    Scheduled Meds: . aspirin  300 mg Rectal Daily   Or  . aspirin  325 mg Oral Daily  . enoxaparin (LOVENOX) injection  40 mg Subcutaneous Daily  . levothyroxine   100 mcg Oral Daily  . lisinopril  2.5 mg Oral Daily   Continuous Infusions: . sodium chloride 125 mL/hr at 04/18/16 0157     LOS: 1 day   Zak Gondek, Orpah Melter, MD [facility] Pager [phone]  If 7PM-7AM, please contact night-coverage [email or web address] Password [email or web address] 04/19/2016, 2:18 PM

## 2016-04-19 NOTE — Progress Notes (Signed)
PT Cancellation Note  Patient Details Name: Deanna Hill MRN: [id number] DOB: 09/30/1940   Cancelled Treatment:    Reason Eval/Treat Not Completed: Medical issues which prohibited therapy;Patient declined, no reason specified. Pt with Rapid Response call this am due to onset of CVA like symptoms and dizziness with nursing while walking back to bed from bathroom. Work up pending. [place], OT spoke with RN who was in agreement with therapies plan to hold today and follow up at later date.    [name] 04/19/2016, 10:41 AM   [name], PTA, CLT Acute Rehab Services [phone] 04/19/16, 10:43 AM

## 2016-04-19 NOTE — Progress Notes (Signed)
Her facial droop is 80% better now than it was @ 0815 this morning. Awaiting transport to CT.

## 2016-04-19 NOTE — Progress Notes (Signed)
OT Cancellation Note  Patient Details Name: Deanna Hill MRN: [id number] DOB: [birth year]   Cancelled Treatment:    Reason Eval/Treat Not Completed: Medical issues which prohibited therapy Pt with Rapid Response call this am due to onset of CVA like symptoms and dizziness with nursing while walking back to bed from bathroom. Work up pending. I spoke with RN who was in agreement with therapies plan to hold today and follow up at later date  Almon Register [id number] 04/19/2016, 11:20 AM

## 2016-04-19 NOTE — Progress Notes (Signed)
Resting, h/a a bit better, nausea better.

## 2016-04-19 NOTE — Progress Notes (Addendum)
STROKE TEAM PROGRESS NOTE   HISTORY OF PRESENT ILLNESS (per record)  Deanna Hill is an 76 y.o. female who presented for assessment of acute confusion with headache and nausea. Symptoms first noticed by her daughter at 7:30 PM on Friday while speaking with her mother over the phone. The patient had difficulty with word finding. On arrival to the ED she was unable to follow simple commands. LKN not known.    SUBJECTIVE (INTERVAL HISTORY) Her  Family is not at the bedside.  This morning while with the nurse, she was walking back from the bathroom and became acutely dizzy and confused.  She said she experienced right arm numbness.  RN noted new onset right facial weakness.  STAT head CT performed and no new findings were noted.  She still feels "confused" and now complains of headache and nausea   OBJECTIVE Temp:  [97.7 F (36.5 C)-98.7 F (37.1 C)] 97.7 F (36.5 C) (02/04 0505) Pulse Rate:  [73-96] 73 (02/04 0505) Cardiac Rhythm: Normal sinus rhythm (02/04 0716) Resp:  [16-19] 16 (02/04 0505) BP: (125-179)/(54-87) 135/59 (02/04 0505) SpO2:  [93 %-99 %] 97 % (02/04 0505)  CBC:  Recent Labs Lab 04/16/16 1037  04/17/16 2030 04/18/16 0513 04/19/16 0336  WBC 10.9*  < > 17.4* 18.1* 11.1*  NEUTROABS 7.8*  --  16.3*  --   --   HGB 12.7  < > 12.9 12.0 12.1  HCT 37.9  < > 40.5 37.7 38.5  MCV 89.6  < > 92.7 92.4 93.7  PLT 629*  < > 758* 690* 578*  < > = values in this interval not displayed.  Basic Metabolic Panel:   Recent Labs Lab 04/18/16 0513 04/19/16 0336  NA 139 141  K 3.9 3.8  CL 104 107  CO2 26 27  GLUCOSE 123* 102*  BUN 18 17  CREATININE 0.78 0.88  CALCIUM 8.9 9.1    Lipid Panel:     Component Value Date/Time   CHOL 162 04/18/2016 0513   TRIG 58 04/18/2016 0513   HDL 44 04/18/2016 0513   CHOLHDL 3.7 04/18/2016 0513   VLDL 12 04/18/2016 0513   LDLCALC 106 (H) 04/18/2016 0513   HgbA1c: No results found for: HGBA1C Urine Drug Screen: No results found for:  LABOPIA, COCAINSCRNUR, LABBENZ, AMPHETMU, THCU, LABBARB    IMAGING  Ct Head Wo Contrast 04/17/2016 Normal head CT for age.   Ct Head Wo Contrast 04/19/2016 Report:  1. Ill-defined blurring involving the gray-white junction within the left parietoccipital lobe, similar to recent brain MRI, favored to represent PRES. 2. Similar findings of mild microvascular ischemic disease.  Mr Jodene Nam Head Wo Contrast 04/18/2016  MRI HEAD:  Abnormal signal LEFT parietal occipital lobe with imaging characteristics of posterior reversible encephalopathic syndrome, less likely status epilepticus. Moderate chronic small vessel ischemic disease.   MRA HEAD:  No emergent large vessel occlusion. Moderate stenosis LEFT M2 origin.   MRA NECK:   Negative.    Portable chest x-ray 1 view 04/18/2016 Aortic atherosclerosis.  No acute cardiopulmonary abnormality seen.   Transthoracic Echocardiogram 04/18/2016 Study Conclusions - Left ventricle: The cavity size was normal. Wall thickness was   normal. Systolic function was normal. The estimated ejection   fraction was in the range of 60% to 65%. Wall motion was normal;   there were no regional wall motion abnormalities. Features are   consistent with a pseudonormal left ventricular filling pattern,   with concomitant abnormal relaxation and increased filling   pressure (  grade 2 diastolic dysfunction). - Aortic valve: Valve area (VTI): 2.83 cm^2. Valve area (Vmax):   3.11 cm^2. Valve area (Vmean): 2.88 cm^2.   EEG 04/18/2016 EEG Abnormalities:  1) asymmetric PDR 2) focal predominance of delta in the left posterior quadrant 3) generalized irregular slow activity 4) slow PDR  Clinical Interpretation: This EEG is consistent with a suggestion  of a focal cervical dysfunction in the left posterior quadrant in  the setting of a generalized non-specific cerebral  dysfunction(encephalopathy). There was no seizure or seizure  predisposition recorded on this  study. Please note that a normal  EEG does not preclude the possibility of epilepsy.     PHYSICAL EXAM  HEENT-  Normocephalic/atraumatic.   Lungs- Respirations unlabored. No gross wheezing.  Extremities- Well-perfused.   Neurological Examination Mental Status: Awake and better attention than yesterday.  Receptive and expressive dysphasia seem improved. Speaking fluently. Follows commands.   Cranial Nerves: II: now responds consistently to visual stimuli in right visual field. But appears to have an incomplete hemianopsia on the right.  PERRL.  III,IV, VI: ptosis not present, extra-ocular motions are conjugate  V,VII: Mild right facial droop, lower quadrant.  VIII: hearing grossly intact IX,X: Able to swallow without trouble XI: intact XII: intact   Motor: generalized weakness.  4/5 throughout   Sensory: Reports intact to LT  Cerebellar: improved tremor at rest  Gait: Deferred due to falls risk concerns.    ASSESSMENT/PLAN Deanna Hill is a 76 y.o. female with history of thrombocytosis, hypothyroidism, hyperlipidemia, essential tremor, and anxiety presenting with confusion, headache, and word finding difficulties. She did not receive IV t-PA due to unclear time of onset.  Possible PRES given MRI results:   Resultant  Visual field loss and confusion  MRI - Abnormal signal LEFT parietal occipital lobe with imaging characteristics of posterior reversible encephalopathic syndrome, less likely status epilepticus.  MRA - Moderate stenosis LEFT M2 origin.   MRA Neck - negative  Repeat Head CT - today - report pending  EEG - see report above.  Carotid Doppler - MRA neck  2D Echo - EF 60-65%. No cardiac source of emboli identified.  LDL - 106  HgbA1c pending  VTE prophylaxis - Lovenox Diet regular Room service appropriate? Yes; Fluid consistency: Thin  aspirin 325 mg daily prior to admission, now on aspirin 325 mg daily  Patient counseled to be compliant  with her antithrombotic medications  Ongoing aggressive stroke risk factor management  Therapy recommendations: - pending  Disposition: Pending  Hypertension  Stable  Permissive hypertension (OK if < 220/120) but gradually normalize in 5-7 days  Long-term BP goal normotensive  Hyperlipidemia  Home meds:  No lipid lowering medications prior to admission.  LDL 106, goal < 70  Add Lipitor 40 mg daily  Continue statin at discharge   Other Stroke Risk Factors  Advanced age   Other Active Problems  Thrombocytosis - 690  Leukocytosis - 18.1 -> 11.1 (afebrile) - urinalysis - trace leukocytes otherwise normal - CXR today - unremarkable  Pt had another similar episode today while going to the bathroom. Stat Head CT unremarkable. ? Orthostatic?   ATTENDING NOTE: Patient was seen and examined by me personally. Documentation reflects findings. The laboratory and radiographic studies reviewed by me. ROS completed by me personally and pertinent positives fully documented  Condition: improving  Assessment and plan completed by me personally and fully documented above. Plans/Recommendations include:     EEG - (report above); IF patient continues to  have spells, would consider continuous EEG  Leukocytosis of unclear etiology; but improving  Will check orthostatic vitals  Will follow  SIGNED BY: Dr. Laddie Aquas day # 1   To contact Stroke Continuity provider, please refer to [email or web address] After hours, contact General Neurology

## 2016-04-20 LAB — SODIUM, URINE, RANDOM: SODIUM UR: 54 mmol/L

## 2016-04-20 LAB — CORTISOL-AM, BLOOD: Cortisol - AM: 3.5 ug/dL — ABNORMAL LOW (ref 6.7–22.6)

## 2016-04-20 MED ORDER — COSYNTROPIN 0.25 MG IJ SOLR
0.2500 mg | Freq: Once | INTRAMUSCULAR | Status: AC
  Administered 2016-04-21: 0.25 mg via INTRAVENOUS
  Filled 2016-04-20: qty 0.25

## 2016-04-20 MED ORDER — DIVALPROEX SODIUM ER 500 MG PO TB24
500.0000 mg | ORAL_TABLET | Freq: Every day | ORAL | Status: DC
  Administered 2016-04-20 – 2016-04-21 (×2): 500 mg via ORAL
  Filled 2016-04-20 (×2): qty 1

## 2016-04-20 MED ORDER — AMLODIPINE BESYLATE 5 MG PO TABS
5.0000 mg | ORAL_TABLET | Freq: Every day | ORAL | Status: DC
  Administered 2016-04-20 – 2016-04-22 (×3): 5 mg via ORAL
  Filled 2016-04-20 (×3): qty 1

## 2016-04-20 MED ORDER — METOPROLOL TARTRATE 12.5 MG HALF TABLET: 12.5000 mg | ORAL_TABLET | Freq: Two times a day (BID) | ORAL | Status: DC

## 2016-04-20 MED ORDER — LISINOPRIL 5 MG PO TABS: 5.0000 mg | ORAL_TABLET | Freq: Every day | ORAL | Status: DC

## 2016-04-20 MED ORDER — ATORVASTATIN CALCIUM 40 MG PO TABS
40.0000 mg | ORAL_TABLET | Freq: Every day | ORAL | Status: DC
  Administered 2016-04-21: 40 mg via ORAL
  Filled 2016-04-20: qty 1

## 2016-04-20 NOTE — Progress Notes (Signed)
STROKE TEAM PROGRESS NOTE   HISTORY OF PRESENT ILLNESS (per record)  Deanna Hill is an 76 y.o. female who presented for assessment of acute confusion with headache and nausea. Symptoms first noticed by her daughter at 7:30 PM on Friday while speaking with her mother over the phone. The patient had difficulty with word finding. On arrival to the ED she was unable to follow simple commands. LKN not known.    SUBJECTIVE (INTERVAL HISTORY) Her  Family is not at the bedside.  She is walking with therapist on the floor. She developed transient neurological worsening with facial droop y`day after BP lowering to 127/85 hence antihypertensives on hold per Dr Wyline Copas.She has chronic essential thrombocytosis and is on hydroxyurea.  OBJECTIVE Temp:  [97.9 F (36.6 C)-98.9 F (37.2 C)] 98.8 F (37.1 C) (02/05 1238) Pulse Rate:  [74-86] 83 (02/05 1238) Cardiac Rhythm: Normal sinus rhythm (02/05 0700) Resp:  [18-20] 18 (02/05 1238) BP: (134-183)/(62-76) 134/62 (02/05 1238) SpO2:  [95 %-100 %] 100 % (02/05 1238)  CBC:  Recent Labs Lab 04/16/16 1037  04/17/16 2030 04/18/16 0513 04/19/16 0336  WBC 10.9*  < > 17.4* 18.1* 11.1*  NEUTROABS 7.8*  --  16.3*  --   --   HGB 12.7  < > 12.9 12.0 12.1  HCT 37.9  < > 40.5 37.7 38.5  MCV 89.6  < > 92.7 92.4 93.7  PLT 629*  < > 758* 690* 578*  < > = values in this interval not displayed.  Basic Metabolic Panel:   Recent Labs Lab 04/18/16 0513 04/19/16 0336  NA 139 141  K 3.9 3.8  CL 104 107  CO2 26 27  GLUCOSE 123* 102*  BUN 18 17  CREATININE 0.78 0.88  CALCIUM 8.9 9.1    Lipid Panel:     Component Value Date/Time   CHOL 162 04/18/2016 0513   TRIG 58 04/18/2016 0513   HDL 44 04/18/2016 0513   CHOLHDL 3.7 04/18/2016 0513   VLDL 12 04/18/2016 0513   LDLCALC 106 (H) 04/18/2016 0513   HgbA1c:  Lab Results  Component Value Date   HGBA1C 5.7 (H) 04/18/2016   Urine Drug Screen: No results found for: LABOPIA, COCAINSCRNUR, LABBENZ,  AMPHETMU, THCU, LABBARB    IMAGING  Ct Head Wo Contrast 04/17/2016 Normal head CT for age.   Ct Head Wo Contrast 04/19/2016 Report:  1. Ill-defined blurring involving the gray-white junction within the left parietoccipital lobe, similar to recent brain MRI, favored to represent PRES. 2. Similar findings of mild microvascular ischemic disease.  Mr Jodene Nam Head Wo Contrast 04/18/2016  MRI HEAD:  Abnormal signal LEFT parietal occipital lobe with imaging characteristics of posterior reversible encephalopathic syndrome, less likely status epilepticus. Moderate chronic small vessel ischemic disease.   MRA HEAD:  No emergent large vessel occlusion. Moderate stenosis LEFT M2 origin.   MRA NECK:   Negative.    Portable chest x-ray 1 view 04/18/2016 Aortic atherosclerosis.  No acute cardiopulmonary abnormality seen.   Transthoracic Echocardiogram 04/18/2016 Study Conclusions - Left ventricle: The cavity size was normal. Wall thickness was   normal. Systolic function was normal. The estimated ejection   fraction was in the range of 60% to 65%. Wall motion was normal;   there were no regional wall motion abnormalities. Features are   consistent with a pseudonormal left ventricular filling pattern,   with concomitant abnormal relaxation and increased filling   pressure (grade 2 diastolic dysfunction). - Aortic valve: Valve area (VTI): 2.83 cm^2.  Valve area (Vmax):   3.11 cm^2. Valve area (Vmean): 2.88 cm^2.   EEG 04/18/2016 EEG Abnormalities:  1) asymmetric PDR 2) focal predominance of delta in the left posterior quadrant 3) generalized irregular slow activity 4) slow PDR  Clinical Interpretation: This EEG is consistent with a suggestion  of a focal cerebral dysfunction in the left posterior quadrant in  the setting of a generalized non-specific cerebral  dysfunction(encephalopathy). There was no seizure or seizure  predisposition recorded on this study. Please note that a normal   EEG does not preclude the possibility of epilepsy.     PHYSICAL EXAM  HEENT-  Normocephalic/atraumatic.   Lungs- Respirations unlabored. No gross wheezing.  Extremities- Well-perfused.   Neurological Examination Mental Status: Awake alert . No aphasia or dysarthria. Occasional word finding difficulty  Speaking fluently. Follows commands.   Cranial Nerves: II: now responds consistently to visual stimuli in right visual field. But appears to have an incomplete hemianopsia on the right.  PERRL.  III,IV, VI: ptosis not present, extra-ocular motions are conjugate  V,VII: Mild right facial droop, lower quadrant.  VIII: hearing grossly intact IX,X: Able to swallow without trouble XI: intact XII: intact   Motor:  No focal weakness.   Sensory: Reports intact to LT  Cerebellar: improved tremor at rest  Gait: Deferred due to falls risk concerns.    ASSESSMENT/PLAN [name] is a 76 y.o. female with history of thrombocytosis, hypothyroidism, hyperlipidemia, essential tremor, and anxiety presenting with confusion, headache, and word finding difficulties. She did not receive IV t-PA due to unclear time of onset.  Possible PRES given MRI results:  Unwitnessed seizure with post ictal confusion less likely  Resultant  Visual field loss and confusion  MRI - Abnormal signal LEFT parietal occipital lobe with imaging characteristics of posterior reversible encephalopathic syndrome, less likely status epilepticus.  MRA - Moderate stenosis LEFT M2 origin.   MRA Neck - negative  Repeat Head CT - today - report pending  EEG - see report above.  Carotid Doppler - MRA neck  2D Echo - EF 60-65%. No cardiac source of emboli identified.  LDL - 106  HgbA1c pending  VTE prophylaxis - Lovenox Diet regular Room service appropriate? Yes; Fluid consistency: Thin  aspirin 325 mg daily prior to admission, now on aspirin 325 mg daily  Patient counseled to be compliant with her  antithrombotic medications  Ongoing aggressive stroke risk factor management  Therapy recommendations: - pending  Disposition: Pending  Hypertension  Stable  Permissive hypertension (OK if < 220/120) but gradually normalize in 5-7 days  Long-term BP goal normotensive  Hyperlipidemia  Home meds:  No lipid lowering medications prior to admission.  LDL 106, goal < 70  Add Lipitor 40 mg daily  Continue statin at discharge   Other Stroke Risk Factors  Advanced age   Other Active Problems  Thrombocytosis - 690  Leukocytosis - 18.1 -> 11.1 (afebrile) - urinalysis - trace leukocytes otherwise normal - CXR today - unremarkable  Pt had another similar episode today while going to the bathroom. Stat Head CT unremarkable. ? Orthostatic?   I have personally examined this patient, reviewed notes, independently viewed imaging studies, participated in medical decision making and plan of care.ROS completed by me personally and pertinent positives fully documented  I have made any additions or clarifications directly to the above note.Will need repeat MRI w/wo in 2-3 months to follow abn MRI findings. Recommend depakote Er 500 mg daily for headache and change SBP  goal to 130-150. D/w Dr Wyline Copas and answered questions. Greater than 50% of time during this study 5 minute visit was spent on counseling and coordination of care about her headache, MRI findings and answering questions  Antony Contras, MD Medical Director [place] Pager: [phone] 04/20/2016 3:02 PM      Hospital day # 1   To contact Stroke Continuity provider, please refer to [email or web address] After hours, contact General Neurology

## 2016-04-20 NOTE — Progress Notes (Signed)
Orthostatics completed.  

## 2016-04-20 NOTE — Progress Notes (Signed)
PROGRESS NOTE    Deanna Hill  [id number] DOB: [birth year] DOA: 04/17/2016 PCP: Gennette Pac, MD    Brief Narrative:  76 y.o. female with a past medical history significant for Hypothyroidism, essential thrombocytosis who presents with acute altered mental status.    Caveat that patient is aphasic and unable to provide her own history, so all history collected from the chart.  Evidently the patient was last normal around 1PM today, she was paying bills somewhere, and suddenly wasn't able to.  It is unclear where she was or how she got home, but her daughter called her around 5PM and felt she could answer some questions but seemed "not herself" and could not consistently answer questions, so her daughter brought her to the ER where her symptoms worsened.  Assessment & Plan:   Principal Problem:   Cerebral embolism with cerebral infarction Active Problems:   Essential thrombocytosis (HCC)   Hypothyroidism, acquired   Iron deficiency anemia   Other secondary hypertension   Cerebrovascular accident (CVA) ([place])   PRES (posterior reversible encephalopathy syndrome)   Stroke ([place])  1. Possible encephalopathy secondary to HTN:  - MRI neg for CVA. -Continued on daily aspirin 325 mg -Lipids, hemoglobin A1c -Neurology following. EEG done -PT/OT/SLP consulted -This AM, pt with recurrent facial droop and near syncope -Head CT done and reviewed - essentially unchanged -Allowing gradual normalization of BP  2. Hypothyroidism:  - Will continue thyroid replacement as tolerated  3. Hypertensive crisis:  No previous history of HTN. -No CVA on MRI -BP normalized overnight with lisinopril 5mg  and metoprolol 25mg  bid -will cont on lisinopril 2.5mg  daily for now to allow higher BP with plan to gradually normalize BP  -BP this AM remains elevated. Will increase lisinopril dose to 5mg   4. Thrombocytosis:  Platelets improved to 578 at last check -Most recent plt count  demonstrates improvement  5. Leukocytosis - WBC improved to 11.1 - remains afebrile  6. Orthostatic hypotension - Given IVF overnight with continued orthostasis - AM random cortisol low at 3.5 - Have ordered cosyntropin stim test for the morning   DVT prophylaxis: Lovenox subQ Code Status: Full Family Communication: Pt in room Disposition Plan: Uncertain at this time  Consultants:   neurology  Procedures:     Antimicrobials: Anti-infectives    None      Subjective: States headaches appear improved. Reports feeling lightheaded when standing this AM during orthostatic vitals  Objective: Vitals:   04/20/16 0436 04/20/16 0837 04/20/16 0935 04/20/16 1238  BP: (!) 177/73 (!) 165/63 (!) 168/76 134/62  Pulse: 74 86  83  Resp: 20 18  18   Temp: 98.7 F (37.1 C) 97.9 F (36.6 C)  98.8 F (37.1 C)  TempSrc: Oral Oral  Oral  SpO2: 97% 99%  100%  Weight:        Intake/Output Summary (Last 24 hours) at 04/20/16 1430 Last data filed at 04/20/16 0700  Gross per 24 hour  Intake              240 ml  Output                0 ml  Net              240 ml   Filed Weights   04/18/16 0100  Weight: 73.1 kg (161 lb 2.5 oz)    Examination:  General exam: sitting in chair, conversant, in nad Respiratory system: normal resp effort, no wheezing Cardiovascular system: regular rhythm, s1-s2 on  auscultation Gastrointestinal system: nontender, pos BS, nondistended Central nervous system: no seizures, no tremors Extremities: no cyanosis, no joint deformities Skin: No rashes, no pallor Psychiatry: affect normal// no visual hallucinations   Data Reviewed: I have personally reviewed following labs and imaging studies  CBC:  Recent Labs Lab 04/16/16 1037 04/17/16 2030 04/18/16 0513 04/19/16 0336  WBC 10.9* 17.4* 18.1* 11.1*  NEUTROABS 7.8* 16.3*  --   --   HGB 12.7 12.9 12.0 12.1  HCT 37.9 40.5 37.7 38.5  MCV 89.6 92.7 92.4 93.7  PLT 629* 758* 690* 578*   Basic  Metabolic Panel:  Recent Labs Lab 04/17/16 2030 04/18/16 0513 04/19/16 0336  NA 137 139 141  K 3.8 3.9 3.8  CL 100* 104 107  CO2 22 26 27   GLUCOSE 152* 123* 102*  BUN 22* 18 17  CREATININE 0.79 0.78 0.88  CALCIUM 9.5 8.9 9.1   GFR: Estimated Creatinine Clearance: 56.8 mL/min (by C-G formula based on SCr of 0.88 mg/dL). Liver Function Tests:  Recent Labs Lab 04/17/16 2030 04/18/16 0513  AST 22 23  ALT 19 19  ALKPHOS 46 43  BILITOT 0.6 0.8  PROT 7.2 6.4*  ALBUMIN 4.6 4.0   No results for input(s): LIPASE, AMYLASE in the last 168 hours. No results for input(s): AMMONIA in the last 168 hours. Coagulation Profile:  Recent Labs Lab 04/17/16 2030  INR 1.12   Cardiac Enzymes: No results for input(s): CKTOTAL, CKMB, CKMBINDEX, TROPONINI in the last 168 hours. BNP (last 3 results) No results for input(s): PROBNP in the last 8760 hours. HbA1C:  Recent Labs  04/18/16 0513  HGBA1C 5.7*   CBG:  Recent Labs Lab 04/17/16 2032 04/19/16 0824  GLUCAP 147* 101*   Lipid Profile:  Recent Labs  04/18/16 0513  CHOL 162  HDL 44  LDLCALC 106*  TRIG 58  CHOLHDL 3.7   Thyroid Function Tests:  Recent Labs  04/19/16 0336  TSH 1.259  FREET4 1.05   Anemia Panel: No results for input(s): VITAMINB12, FOLATE, FERRITIN, TIBC, IRON, RETICCTPCT in the last 72 hours. Sepsis Labs: No results for input(s): PROCALCITON, LATICACIDVEN in the last 168 hours.  No results found for this or any previous visit (from the past 240 hour(s)).   Radiology Studies: Ct Head Wo Contrast  Result Date: 04/19/2016 CLINICAL DATA:  Left-sided facial droop and weakness, now resolving. EXAM: CT HEAD WITHOUT CONTRAST TECHNIQUE: Contiguous axial images were obtained from the base of the skull through the vertex without intravenous contrast. COMPARISON:  04/17/2016; Brain MRI - 04/17/2016 FINDINGS: Brain: Scattered periventricular hypodensities compatible microvascular ischemic disease. There is  ill-defined blurring involving the left parietal occipital gray-white junction (image 12, series 201), similar to preceding brain MRI. The gray-white differentiation is otherwise well maintained without CT evidence of acute large territory infarct. No intraparenchymal or extra-axial mass or hemorrhage. Normal size a configuration of the ventricles and the basilar cisterns. No midline shift. Vascular: No hyperdense vessel or unexpected calcification. Skull: No displaced calvarial fracture. Sinuses/Orbits: Limited visualization the paranasal sinuses and mastoid air cells is normal. No air-fluid levels. Post bilateral cataract surgery. Other: Regional soft tissues appear normal. IMPRESSION: 1. Ill-defined blurring involving the gray-white junction within the left parietoccipital lobe, similar to recent brain MRI, favored to represent PRES. 2. Similar findings of mild microvascular ischemic disease. Electronically Signed   By: Sandi Mariscal M.D.   On: 04/19/2016 09:35    Scheduled Meds: . amLODipine  5 mg Oral Daily  . aspirin  300 mg Rectal Daily   Or  . aspirin  325 mg Oral Daily  . [START ON 04/21/2016] atorvastatin  40 mg Oral q1800  . [START ON 04/21/2016] cosyntropin  0.25 mg Intravenous Once  . divalproex  500 mg Oral Daily  . enoxaparin (LOVENOX) injection  40 mg Subcutaneous Daily  . levothyroxine  100 mcg Oral Daily  . [START ON 04/21/2016] lisinopril  5 mg Oral Daily   Continuous Infusions: . sodium chloride 100 mL/hr at 04/20/16 1014     LOS: 1 day   Elizabet Schweppe, Orpah Melter, MD [facility] Pager [phone]  If 7PM-7AM, please contact night-coverage [email or web address] Password [email or web address] 04/20/2016, 2:30 PM

## 2016-04-20 NOTE — Progress Notes (Signed)
Physical Therapy Treatment Patient Details Name: Deanna Hill MRN: [id number] DOB: 1940-12-17 Today's Date: 04/20/2016    History of Present Illness 76 yo female admitted through the ED on 04/17/16 with decreased word finding, with confusion with PRES. PMHx: Hypothyroidism, essential thrombocytosis     PT Comments    Pt very pleasant with no noticeable word finding deficits and reports that she is only having trouble with remembering everything that has occurred since admission. Pt able to perform hall ambulation and stairs without need for assist with increased stability with use of RW. Pt continues to demonstrate high fall risk with difficulty with narrowed BOS or turning. She is able to sit for bathing and dressing and reports friends can assist. Will continue to follow.   Follow Up Recommendations  Home health PT;Supervision for mobility/OOB     Equipment Recommendations  Rolling walker with 5" wheels    Recommendations for Other Services       Precautions / Restrictions Precautions Precautions: Fall Restrictions Weight Bearing Restrictions: No    Mobility  Bed Mobility Overal bed mobility: Needs Assistance Bed Mobility: Supine to Sit     Supine to sit: Supervision     General bed mobility comments: in chair on arrival  Transfers Overall transfer level: Needs assistance Equipment used: None Transfers: Sit to/from Stand Sit to Stand: Supervision         General transfer comment: Pt reports "feeling weird" today but no dizziness with functional mobility.   Ambulation/Gait Ambulation/Gait assistance: Min guard Ambulation Distance (Feet): 300 Feet Assistive device: Rolling walker (2 wheeled);None Gait Pattern/deviations: Step-through pattern;Decreased stride length   Gait velocity interpretation: Below normal speed for age/gender General Gait Details: Slower pace, head down throughout. Pt with RW for half of gait and steady with no LOB. Walked 150' without  Rw with slightly altered gait but no overt LOB or need for assist with gait   Stairs Stairs: Yes   Stair Management: Alternating pattern;Forwards;One rail Right Number of Stairs: 3    Wheelchair Mobility    Modified Rankin (Stroke Patients Only) Modified Rankin (Stroke Patients Only) Pre-Morbid Rankin Score: No symptoms Modified Rankin: Moderate disability     Balance Overall balance assessment: Needs assistance Sitting-balance support: No upper extremity supported;Feet supported Sitting balance-Leahy Scale: Good     Standing balance support: No upper extremity supported Standing balance-Leahy Scale: Fair Standing balance comment: Reaching for items to stabilize self during functional mobility. More comfortable with single UE support from therapist (min assist). Single Leg Stance - Right Leg: 1 Single Leg Stance - Left Leg: 1 Tandem Stance - Right Leg: 1 Tandem Stance - Left Leg: 1 Rhomberg - Eyes Opened: 60 Rhomberg - Eyes Closed: 20        Cognition Arousal/Alertness: Awake/alert Behavior During Therapy: WFL for tasks assessed/performed Overall Cognitive Status: Impaired/Different from baseline Area of Impairment: Memory;Following commands;Problem solving     Memory: Decreased short-term memory Following Commands: Follows multi-step commands inconsistently     Problem Solving: Slow processing General Comments: Pt able to follow one-step commands well but requires increased time and cueing for multi-step commands at times.    Exercises      General Comments        Pertinent Vitals/Pain Pain Assessment: 0-10 Pain Score: 4  Pain Location: Headache Pain Descriptors / Indicators: Aching Pain Intervention(s): Monitored during session    Home Living Family/patient expects to be discharged to:: Private residence Living Arrangements: Alone Available Help at Discharge: Available PRN/intermittently;Family Type of Home:  House Home Access: Stairs to  enter Entrance Stairs-Rails: Right Home Layout: One level Home Equipment: Environmental consultant - 2 wheels Additional Comments: Lives alone, family nearby but unable to help full time.    Prior Function Level of Independence: Independent      Comments: Drives and likes to go to the store.   PT Goals (current goals can now be found in the care plan section) Acute Rehab PT Goals Patient Stated Goal: To get better. Progress towards PT goals: Progressing toward goals    Frequency    Min 3X/week      PT Plan Current plan remains appropriate;Frequency needs to be updated    Co-evaluation             End of Session Equipment Utilized During Treatment: Gait belt Activity Tolerance: Patient tolerated treatment well Patient left: in chair;with call bell/phone within [place];with chair alarm set     Time: 1339-1405 PT Time Calculation (min) (ACUTE ONLY): 26 min  Charges:  $Gait Training: 8-22 mins $Physical Performance Test: 8-22 mins                    G Codes:      Deanna Hill B Hosie Sharman 05-19-16, 2:17 PM  Deanna Hill, [place]

## 2016-04-20 NOTE — Evaluation (Signed)
Occupational Therapy Evaluation Patient Details Name: Deanna Hill MRN: [id number] DOB: 1940/12/02 Today's Date: 04/20/2016    History of Present Illness 76 yo female admitted through the ED on 04/17/16 with R hemineglect and decreased word finding, with confusion.     Clinical Impression   PTA, pt was independent with ADL and functional mobility and living alone. Pt currently requires min guard to min assist with overall ADL performance. She reports "feeling weird" during functional mobility which resolved when seated but did not report dizziness during session. Pt reports seeing light and movement in her R peripheral field associated with headaches recently but no symptoms of this during session. Pt would benefit from continued OT services while admitted to improve independence with ADL and functional mobility. Currently recommend home health OT services post-acute D/C but if pt unable to progress with ADL independence may need short-term SNF placement. Will continue to follow acutely and update D/C recommendations as necessary.    Follow Up Recommendations  Home health OT;Supervision/Assistance - 24 hour    Equipment Recommendations  3 in 1 bedside commode    Recommendations for Other Services       Precautions / Restrictions Precautions Precautions: Fall Restrictions Weight Bearing Restrictions: No      Mobility Bed Mobility Overal bed mobility: Needs Assistance Bed Mobility: Supine to Sit     Supine to sit: Supervision     General bed mobility comments: No physical assistance needed. Supervision for safety.  Transfers Overall transfer level: Needs assistance Equipment used: None Transfers: Sit to/from Stand Sit to Stand: Min guard         General transfer comment: Pt reports "feeling weird" today but no dizziness with functional mobility.     Balance Overall balance assessment: Needs assistance Sitting-balance support: No upper extremity supported;Feet  supported Sitting balance-Leahy Scale: Good     Standing balance support: No upper extremity supported Standing balance-Leahy Scale: Fair Standing balance comment: Reaching for items to stabilize self during functional mobility. More comfortable with single UE support from therapist (min assist).                            ADL Overall ADL's : Needs assistance/impaired     Grooming: Wash/dry hands;Min guard;Standing   Upper Body Bathing: Supervision/ safety;Sitting   Lower Body Bathing: Min guard;Sit to/from stand   Upper Body Dressing : Supervision/safety;Sitting   Lower Body Dressing: Min guard;Sit to/from stand   Toilet Transfer: Minimal assistance;Ambulation;Min guard   Toileting- Water quality scientist and Hygiene: Min guard;Sit to/from stand       Functional mobility during ADLs: Minimal assistance;Min guard General ADL Comments: Pt required min handheld assist at times but min guard for safety at other times. Pt declined use of RW during this session but feel she would be safer with this.     Vision Vision Assessment?: Vision impaired- to be further tested in functional context Additional Comments: Pt's glasses were not in room during session. Reports bright light and seeing strange movements that look like a person in her R peripheral field. She did not report any of these symptoms on evaluation. No peripheral field deficits noted but will continue to assess.   Perception     Praxis      Pertinent Vitals/Pain Pain Assessment: 0-10 Pain Score: 3  Pain Location: Headache Pain Descriptors / Indicators: Headache Pain Intervention(s): Monitored during session;Premedicated before session     Hand Dominance Right   Extremity/Trunk  Assessment Upper Extremity Assessment Upper Extremity Assessment: RUE deficits/detail;LUE deficits/detail;Generalized weakness RUE Deficits / Details: Tremor present at rest and with activity but worsens with activity. Reports  that this is the same as baseline. RUE Coordination: decreased fine motor;decreased gross motor (due to tremor) LUE Deficits / Details: Tremor present at rest and with activity but worsens with activity. Reports that this is the same as baseline. LUE Coordination: decreased fine motor (due to tremor)   Lower Extremity Assessment Lower Extremity Assessment: Defer to PT evaluation       Communication Communication Communication: Expressive difficulties (Slight expressive difficulties)   Cognition Arousal/Alertness: Awake/alert Behavior During Therapy: WFL for tasks assessed/performed Overall Cognitive Status: Impaired/Different from baseline Area of Impairment: Memory;Following commands;Problem solving     Memory: Decreased short-term memory Following Commands: Follows multi-step commands inconsistently     Problem Solving: Slow processing General Comments: Pt able to follow one-step commands well but requires increased time and cueing for multi-step commands at times.   General Comments       Exercises       Shoulder Instructions      Home Living Family/patient expects to be discharged to:: Private residence Living Arrangements: Alone Available Help at Discharge: Available PRN/intermittently;Family Type of Home: House Home Access: Stairs to enter [employer] of Steps: 2-3 Entrance Stairs-Rails: Right Home Layout: One level     Bathroom Shower/Tub: Occupational psychologist: Handicapped height     Home Equipment: Environmental consultant - 2 wheels   Additional Comments: Lives alone, family nearby but unable to help full time.  Lives With: Alone    Prior Functioning/Environment Level of Independence: Independent        Comments: Drives and likes to go to the store.        OT Problem List: Decreased range of motion;Decreased activity tolerance;Impaired balance (sitting and/or standing);Decreased strength;Impaired vision/perception;Decreased  cognition;Decreased safety awareness;Decreased knowledge of use of DME or AE;Decreased knowledge of precautions;Impaired UE functional use;Pain   OT Treatment/Interventions: Self-care/ADL training;Therapeutic exercise;Energy conservation;Therapeutic activities;Cognitive remediation/compensation;Patient/family education;Balance training;Visual/perceptual remediation/compensation;DME and/or AE instruction    OT Goals(Current goals can be found in the care plan section) Acute Rehab OT Goals Patient Stated Goal: To get better. OT Goal Formulation: With patient Time For Goal Achievement: 04/27/16 Potential to Achieve Goals: Good ADL Goals Pt Will Perform Grooming: with modified independence;standing Pt Will Transfer to Toilet: with modified independence;regular height toilet;ambulating Pt Will Perform Toileting - Clothing Manipulation and hygiene: with modified independence;sit to/from stand Pt Will Perform Tub/Shower Transfer: with modified independence;shower seat;Shower transfer;ambulating Additional ADL Goal #1: Pt will identify and incorporate 2 compensatory strategies to imrpove fine motor coordination for ADL.  OT Frequency: Min 3X/week   Barriers to D/C:            Co-evaluation              End of Session Equipment Utilized During Treatment: Gait belt Nurse Communication: Mobility status  Activity Tolerance: Patient tolerated treatment well Patient left: in chair;with call bell/phone within reach   Time: 1118-1141 OT Time Calculation (min): 23 min Charges:  OT General Charges $OT Visit: 1 Procedure OT Evaluation $OT Eval Moderate Complexity: 1 Procedure OT Treatments $Self Care/Home Management : 8-22 mins G-Codes:    Norman Herrlich, OTR/L [id number] 04/20/2016, 12:15 PM

## 2016-04-21 DIAGNOSIS — R262 Difficulty in walking, not elsewhere classified: Secondary | ICD-10-CM

## 2016-04-21 LAB — ACTH STIMULATION, 3 TIME POINTS
CORTISOL BASE: 18.9 ug/dL
Cortisol, 30 Min: 25.4 ug/dL
Cortisol, 60 Min: 32 ug/dL

## 2016-04-21 MED ORDER — LISINOPRIL 5 MG PO TABS
5.0000 mg | ORAL_TABLET | Freq: Every day | ORAL | Status: DC
  Administered 2016-04-22: 5 mg via ORAL
  Filled 2016-04-21: qty 1

## 2016-04-21 MED ORDER — TOPIRAMATE 25 MG PO TABS
25.0000 mg | ORAL_TABLET | Freq: Two times a day (BID) | ORAL | Status: DC
  Administered 2016-04-21 – 2016-04-22 (×2): 25 mg via ORAL
  Filled 2016-04-21 (×2): qty 1

## 2016-04-21 MED ORDER — LISINOPRIL 2.5 MG PO TABS
2.5000 mg | ORAL_TABLET | Freq: Every day | ORAL | Status: DC
  Administered 2016-04-21: 2.5 mg via ORAL
  Filled 2016-04-21: qty 1

## 2016-04-21 NOTE — Progress Notes (Addendum)
PROGRESS NOTE    Deanna Hill  [id number] DOB: [birth year] DOA: 04/17/2016 PCP: Gennette Pac, MD    Brief Narrative:  76 y.o. female with a past medical history significant for Hypothyroidism, essential thrombocytosis who presents with acute altered mental status.    Caveat that patient is aphasic and unable to provide her own history, so all history collected from the chart.  Evidently the patient was last normal around 1PM today, she was paying bills somewhere, and suddenly wasn't able to.  It is unclear where she was or how she got home, but her daughter called her around 5PM and felt she could answer some questions but seemed "not herself" and could not consistently answer questions, so her daughter brought her to the ER where her symptoms worsened.  During this course, the patient was found to be hypertensive with imaging studies consistent with PRES. Patient was also found to be orthostatic. Neurology had been following and adjusted meds for migraines.  Assessment & Plan:   Principal Problem:   Cerebral embolism with cerebral infarction Active Problems:   Essential thrombocytosis (HCC)   Hypothyroidism, acquired   Iron deficiency anemia   Other secondary hypertension   Cerebrovascular accident (CVA) ([place])   PRES (posterior reversible encephalopathy syndrome)   Stroke ([place])  1. Possible encephalopathy secondary to HTN:  - MRI neg for CVA. -Continued on daily aspirin 325 mg -Lipids, hemoglobin A1c -Neurology following. EEG done -PT/OT/SLP consulted -This AM, pt with recurrent facial droop and near syncope -Head CT done and reviewed - essentially unchanged -Stable at present. Continue to gradually titrate BP meds  2. Hypothyroidism:  - Will continue thyroid replacement as tolerated - Stable at present  3. Hypertensive crisis:  - No previous history of HTN. -No CVA on MRI -BP normalized overnight with lisinopril 5mg  and metoprolol 25mg  bid -pt is  continued on 2.5mg  lisinopril. BP elevated this afternoon  -Will increase lisinopril to 5mg   - Check bmet in AM  4. Thrombocytosis:  - Platelets improved to 578 at last check -Most recent plt count demonstrates improvement - Will repeat CBC in AM  5. Leukocytosis - WBC improved to 11.1 - Presently afebrile  6. Orthostatic hypotension - AM random cortisol low at 3.5 - Cosyntropin stim test with base cortisol 18.9 to 32.  - Will continue IVF hydration. Repeat orthostatic vital signs in AM  7. Migraine Headaches - Neurology following - Recommendations to stop depakote and try topamax 25mg  BID  DVT prophylaxis: Lovenox subQ Code Status: Full Family Communication: Pt in room Disposition Plan: Uncertain at this time  Consultants:   neurology  Procedures:     Antimicrobials: Anti-infectives    None      Subjective: Reports dizziness upon standing this AM  Objective: Vitals:   04/21/16 0017 04/21/16 0500 04/21/16 0912 04/21/16 1330  BP: 129/84 124/80 (!) 184/79 (!) 150/59  Pulse: 88 92 86 86  Resp: 18 17 18 18   Temp: 98.6 F (37 C) 98.6 F (37 C) 98.2 F (36.8 C) 98.2 F (36.8 C)  TempSrc: Oral Oral Oral Oral  SpO2: 100% 100% 99% 99%  Weight:        Intake/Output Summary (Last 24 hours) at 04/21/16 1652 Last data filed at 04/21/16 0700  Gross per 24 hour  Intake              690 ml  Output                0 ml  Net  690 ml   Filed Weights   04/18/16 0100  Weight: 73.1 kg (161 lb 2.5 oz)    Examination:  General exam: awake, in nad, conversant Respiratory system: normal chest rise, no audible wheezing Cardiovascular system: regular rate, s1-2  Gastrointestinal system: soft, nondistended, pos BS Central nervous system: cn2-12 grossly intact, strength intact Extremities: perfused, no clubbing Skin: normal skin turgor, no notable skin lesions seen Psychiatry: mood normal// no auditory hallucinations   Data Reviewed: I have personally  reviewed following labs and imaging studies  CBC:  Recent Labs Lab 04/16/16 1037 04/17/16 2030 04/18/16 0513 04/19/16 0336  WBC 10.9* 17.4* 18.1* 11.1*  NEUTROABS 7.8* 16.3*  --   --   HGB 12.7 12.9 12.0 12.1  HCT 37.9 40.5 37.7 38.5  MCV 89.6 92.7 92.4 93.7  PLT 629* 758* 690* [id number]*   Basic Metabolic Panel:  Recent Labs Lab 04/17/16 2030 04/18/16 0513 04/19/16 0336  NA 137 139 141  K 3.8 3.9 3.8  CL 100* 104 107  CO2 22 26 27   GLUCOSE 152* 123* 102*  BUN 22* 18 17  CREATININE 0.79 0.78 0.88  CALCIUM 9.5 8.9 9.1   GFR: Estimated Creatinine Clearance: 56.8 mL/min (by C-G formula based on SCr of 0.88 mg/dL). Liver Function Tests:  Recent Labs Lab 04/17/16 2030 04/18/16 0513  AST 22 23  ALT 19 19  ALKPHOS 46 43  BILITOT 0.6 0.8  PROT 7.2 6.4*  ALBUMIN 4.6 4.0   No results for input(s): LIPASE, AMYLASE in the last 168 hours. No results for input(s): AMMONIA in the last 168 hours. Coagulation Profile:  Recent Labs Lab 04/17/16 2030  INR 1.12   Cardiac Enzymes: No results for input(s): CKTOTAL, CKMB, CKMBINDEX, TROPONINI in the last 168 hours. BNP (last 3 results) No results for input(s): PROBNP in the last 8760 hours. HbA1C: No results for input(s): HGBA1C in the last 72 hours. CBG:  Recent Labs Lab 04/17/16 2032 04/19/16 0824  GLUCAP 147* 101*   Lipid Profile: No results for input(s): CHOL, HDL, LDLCALC, TRIG, CHOLHDL, LDLDIRECT in the last 72 hours. Thyroid Function Tests:  Recent Labs  04/19/16 0336  TSH 1.259  FREET4 1.05   Anemia Panel: No results for input(s): VITAMINB12, FOLATE, FERRITIN, TIBC, IRON, RETICCTPCT in the last 72 hours. Sepsis Labs: No results for input(s): PROCALCITON, LATICACIDVEN in the last 168 hours.  No results found for this or any previous visit (from the past 240 hour(s)).   Radiology Studies: No results found.  Scheduled Meds: . amLODipine  5 mg Oral Daily  . aspirin  300 mg Rectal Daily   Or  .  aspirin  325 mg Oral Daily  . atorvastatin  40 mg Oral q1800  . enoxaparin (LOVENOX) injection  40 mg Subcutaneous Daily  . levothyroxine  100 mcg Oral Daily  . lisinopril  2.5 mg Oral Daily  . topiramate  25 mg Oral BID WC   Continuous Infusions: . sodium chloride 100 mL/hr at 04/21/16 1544     LOS: 2 days   Dillen Belmontes, Orpah Melter, MD [facility] Pager [phone]  If 7PM-7AM, please contact night-coverage [email or web address] Password [email or web address] 04/21/2016, 4:52 PM

## 2016-04-21 NOTE — Progress Notes (Signed)
STROKE TEAM PROGRESS NOTE   HISTORY OF PRESENT ILLNESS (per record)  Deanna Hill is an 76 y.o. female who presented for assessment of acute confusion with headache and nausea. Symptoms first noticed by her daughter at 7:30 PM on Friday while speaking with her mother over the phone. The patient had difficulty with word finding. On arrival to the ED she was unable to follow simple commands. LKN not known.    SUBJECTIVE (INTERVAL HISTORY) Her  Family is not at the bedside.  She is neurologically stable and wants to go home. ACTH stimulation test is normal. She states her headache is better but complains of dizziness and nausea with Depakote OBJECTIVE Temp:  [98.2 F (36.8 C)-98.6 F (37 C)] 98.2 F (36.8 C) (02/06 0912) Pulse Rate:  [80-95] 86 (02/06 0912) Cardiac Rhythm: Normal sinus rhythm (02/06 0700) Resp:  [17-18] 18 (02/06 0912) BP: (124-184)/(79-84) 184/79 (02/06 0912) SpO2:  [99 %-100 %] 99 % (02/06 0912)  CBC:  Recent Labs Lab 04/16/16 1037  04/17/16 2030 04/18/16 0513 04/19/16 0336  WBC 10.9*  < > 17.4* 18.1* 11.1*  NEUTROABS 7.8*  --  16.3*  --   --   HGB 12.7  < > 12.9 12.0 12.1  HCT 37.9  < > 40.5 37.7 38.5  MCV 89.6  < > 92.7 92.4 93.7  PLT 629*  < > 758* 690* 578*  < > = values in this interval not displayed.  Basic Metabolic Panel:   Recent Labs Lab 04/18/16 0513 04/19/16 0336  NA 139 141  K 3.9 3.8  CL 104 107  CO2 26 27  GLUCOSE 123* 102*  BUN 18 17  CREATININE 0.78 0.88  CALCIUM 8.9 9.1    Lipid Panel:     Component Value Date/Time   CHOL 162 04/18/2016 0513   TRIG 58 04/18/2016 0513   HDL 44 04/18/2016 0513   CHOLHDL 3.7 04/18/2016 0513   VLDL 12 04/18/2016 0513   LDLCALC 106 (H) 04/18/2016 0513   HgbA1c:  Lab Results  Component Value Date   HGBA1C 5.7 (H) 04/18/2016   Urine Drug Screen: No results found for: LABOPIA, COCAINSCRNUR, LABBENZ, AMPHETMU, THCU, LABBARB    IMAGING  Ct Head Wo Contrast 04/17/2016 Normal head CT for  age.   Ct Head Wo Contrast 04/19/2016 Report:  1. Ill-defined blurring involving the gray-white junction within the left parietoccipital lobe, similar to recent brain MRI, favored to represent PRES. 2. Similar findings of mild microvascular ischemic disease.  Mr Jodene Nam Head Wo Contrast 04/18/2016  MRI HEAD:  Abnormal signal LEFT parietal occipital lobe with imaging characteristics of posterior reversible encephalopathic syndrome, less likely status epilepticus. Moderate chronic small vessel ischemic disease.   MRA HEAD:  No emergent large vessel occlusion. Moderate stenosis LEFT M2 origin.   MRA NECK:   Negative.    Portable chest x-ray 1 view 04/18/2016 Aortic atherosclerosis.  No acute cardiopulmonary abnormality seen.   Transthoracic Echocardiogram 04/18/2016 Study Conclusions - Left ventricle: The cavity size was normal. Wall thickness was   normal. Systolic function was normal. The estimated ejection   fraction was in the range of 60% to 65%. Wall motion was normal;   there were no regional wall motion abnormalities. Features are   consistent with a pseudonormal left ventricular filling pattern,   with concomitant abnormal relaxation and increased filling   pressure (grade 2 diastolic dysfunction). - Aortic valve: Valve area (VTI): 2.83 cm^2. Valve area (Vmax):   3.11 cm^2. Valve area (Vmean):  2.88 cm^2.   EEG 04/18/2016 EEG Abnormalities:  1) asymmetric PDR 2) focal predominance of delta in the left posterior quadrant 3) generalized irregular slow activity 4) slow PDR  Clinical Interpretation: This EEG is consistent with a suggestion  of a focal cerebral dysfunction in the left posterior quadrant in  the setting of a generalized non-specific cerebral  dysfunction(encephalopathy). There was no seizure or seizure  predisposition recorded on this study. Please note that a normal  EEG does not preclude the possibility of epilepsy.     PHYSICAL EXAM  HEENT-   Normocephalic/atraumatic.   Lungs- Respirations unlabored. No gross wheezing.  Extremities- Well-perfused.   Neurological Examination Mental Status: Awake alert . No aphasia or dysarthria. Occasional word finding difficulty  Speaking fluently. Follows commands.   Cranial Nerves: II: no visual deficits.PERRL.  III,IV, VI: ptosis not present, extra-ocular motions are conjugate  V,VII: Mild right facial droop, lower quadrant.  VIII: hearing grossly intact IX,X: Able to swallow without trouble XI: intact XII: intact   Motor:  No focal weakness.   Sensory: Reports intact to LT  Cerebellar: improved tremor at rest  Gait: Deferred due to falls risk concerns.    ASSESSMENT/PLAN Deanna Hill is a 76 y.o. female with history of thrombocytosis, hypothyroidism, hyperlipidemia, essential tremor, and anxiety presenting with confusion, headache, and word finding difficulties. She did not receive IV t-PA due to unclear time of onset.  Possible PRES given MRI results:  Unwitnessed seizure with post ictal confusion less likely  Resultant  Visual field loss and confusion  MRI - Abnormal signal LEFT parietal occipital lobe with imaging characteristics of posterior reversible encephalopathic syndrome, less likely status epilepticus.  MRA - Moderate stenosis LEFT M2 origin.   MRA Neck - negative Repeat Head CT 04/19/16 Ill-defined blurring involving the gray-white junction within the left parietoccipital lobe, similar to recent brain MRI, favored to  represent PRES.  EEG - see report above.  Carotid Doppler - MRA neck  2D Echo - EF 60-65%. No cardiac source of emboli identified.  LDL - 106  HgbA1c 5.7  VTE prophylaxis - Lovenox Diet regular Room service appropriate? Yes; Fluid consistency: Thin  aspirin 325 mg daily prior to admission, now on aspirin 325 mg daily  Patient counseled to be compliant with her antithrombotic medications  Ongoing aggressive stroke risk factor  management  Therapy recommendations: - pending  Disposition: Pending  Hypertension  Stable  Permissive hypertension (OK if < 220/120) but gradually normalize in 5-7 days  Long-term BP goal normotensive  Hyperlipidemia  Home meds:  No lipid lowering medications prior to admission.  LDL 106, goal < 70  Add Lipitor 40 mg daily  Continue statin at discharge   Other Stroke Risk Factors  Advanced age   Other Active Problems  Thrombocytosis - 690  Leukocytosis - 18.1 -> 11.1 (afebrile) - urinalysis - trace leukocytes otherwise normal - CXR today - unremarkable  Pt had another similar episode today while going to the bathroom. Stat Head CT unremarkable. ? Orthostatic?   I have personally examined this patient, reviewed notes, independently viewed imaging studies, participated in medical decision making and plan of care.ROS completed by me personally and pertinent positives fully documented  I have made any additions or clarifications directly to the above note.Will need repeat MRI w/wo in 2-3 months to follow abn MRI findings. Recommend  Stop  depakote due to dizziness and nausea and try topamax 25 mg twice daily for headache  . D/w Dr Wyline Copas  and answered questions. Greater than 50% of time during this 25 minute visit was spent on counseling and coordination of care about her headache, MRI findings and answering questions.Stroke team will sign off. F/u as outpatient in 6 weeks.  Antony Contras, MD Medical Director [place] Pager: [phone] 04/21/2016 12:47 PM      Hospital day # 2   To contact Stroke Continuity provider, please refer to [email or web address] After hours, contact General Neurology

## 2016-04-22 DIAGNOSIS — I63113 Cerebral infarction due to embolism of bilateral vertebral arteries: Secondary | ICD-10-CM

## 2016-04-22 DIAGNOSIS — I6300 Cerebral infarction due to thrombosis of unspecified precerebral artery: Secondary | ICD-10-CM

## 2016-04-22 LAB — CBC
HCT: 33.7 % — ABNORMAL LOW (ref 36.0–46.0)
Hemoglobin: 10.7 g/dL — ABNORMAL LOW (ref 12.0–15.0)
MCH: 29.6 pg (ref 26.0–34.0)
MCHC: 31.8 g/dL (ref 30.0–36.0)
MCV: 93.4 fL (ref 78.0–100.0)
Platelets: 540 K/uL — ABNORMAL HIGH (ref 150–400)
RBC: 3.61 MIL/uL — ABNORMAL LOW (ref 3.87–5.11)
RDW: 13.8 % (ref 11.5–15.5)
WBC: 9.7 K/uL (ref 4.0–10.5)

## 2016-04-22 MED ORDER — AMLODIPINE BESYLATE 5 MG PO TABS: 5.0000 mg | ORAL_TABLET | Freq: Every day | ORAL | 1 refills | Status: DC

## 2016-04-22 MED ORDER — LISINOPRIL 5 MG PO TABS: 5.0000 mg | ORAL_TABLET | Freq: Every day | ORAL | 1 refills | Status: DC

## 2016-04-22 MED ORDER — ATORVASTATIN CALCIUM 40 MG PO TABS: 40.0000 mg | ORAL_TABLET | Freq: Every day | ORAL | 2 refills | Status: DC

## 2016-04-22 MED ORDER — TOPIRAMATE 25 MG PO TABS: 25.0000 mg | ORAL_TABLET | Freq: Two times a day (BID) | ORAL | 2 refills | Status: DC

## 2016-04-22 NOTE — Discharge Summary (Signed)
Physician Discharge Summary  Deanna Hill MRN: [id number] DOB/AGE: February 02, 1941 76 y.o.  PCP: Gennette Pac, MD   Admit date: 04/17/2016 Discharge date: 04/22/2016  Discharge Diagnoses:    Principal Problem:   Cerebral embolism with cerebral infarction Active Problems:   Essential thrombocytosis ([place])   Hypothyroidism, acquired   Iron deficiency anemia   Other secondary hypertension   Cerebrovascular accident (CVA) ([place])   PRES (posterior reversible encephalopathy syndrome)   Stroke ([place])   Difficulty in walking, not elsewhere classified PRES VS   complicated migraine   Follow-up recommendations Follow-up with PCP in 3-5 days , including all  additional recommended appointments as below Follow-up CBC, CMP in 3-5 days Follow-up with Dr.Pramod Clydene Fake, MD,Will need repeat MRI w/wo in 2-3 months to follow abn MRI findings.       Current Discharge Medication List    START taking these medications   Details  amLODipine (NORVASC) 5 MG tablet Take 1 tablet (5 mg total) by mouth daily. Qty: 30 tablet, Refills: 1    atorvastatin (LIPITOR) 40 MG tablet Take 1 tablet (40 mg total) by mouth daily at 6 PM. Qty: 30 tablet, Refills: 2    lisinopril (PRINIVIL,ZESTRIL) 5 MG tablet Take 1 tablet (5 mg total) by mouth daily. Qty: 30 tablet, Refills: 1    topiramate (TOPAMAX) 25 MG tablet Take 1 tablet (25 mg total) by mouth 2 (two) times daily with a meal. Qty: 60 tablet, Refills: 2      CONTINUE these medications which have NOT CHANGED   Details  ALPRAZolam (XANAX) 0.25 MG tablet Take 0.5 tablets (0.125 mg total) by mouth 3 (three) times daily as needed for anxiety. Qty: 30 tablet, Refills: 2   Associated Diagnoses: Essential thrombocytosis ([facility]); Anemia, unspecified    aspirin 325 MG tablet Take 325 mg by mouth daily.    Associated Diagnoses: Thrombocytosis ([facility])    calcium carbonate (TUMS - DOSED IN MG ELEMENTAL CALCIUM) 500 MG chewable tablet Chew 2 tablets by mouth  daily as needed for indigestion or heartburn.    Cholecalciferol (VITAMIN D) 2000 UNITS CAPS Take 1 capsule by mouth daily.    Evening Primrose Oil 500 MG CAPS Take 1 capsule by mouth 2 (two) times daily.    hydroxyurea (HYDREA) 500 MG capsule TAKE 1 CAPSULE (500 MG TOTAL) BY MOUTH AS DIRECTED. TAKE MONDAY WEDNESDAY AND FRIDAY. Qty: 48 capsule, Refills: 3   Associated Diagnoses: Essential thrombocytosis ([facility])    levothyroxine (SYNTHROID, LEVOTHROID) 100 MCG tablet Take 100 mcg by mouth daily.    Associated Diagnoses: Thrombocytosis ([facility])    Multiple Vitamin (MULTIVITAMIN) tablet Take 1 tablet by mouth daily.    Associated Diagnoses: Thrombocytosis (H[facility])    nitroGLYCERIN (NITROSTAT) 0.4 MG SL tablet Place 0.4 mg under the tongue every 5 (five) minutes as needed for chest pain.    ranitidine (ZANTAC) 150 MG capsule Take 150 mg by mouth 2 (two) times daily.    Associated Diagnoses: Thrombocytosis ([facility])    acetaminophen (TYLENOL) 325 MG tablet Take 650 mg by mouth daily as needed for mild pain.    BIOTIN PO Take 1 capsule by mouth daily.    metroNIDAZOLE (METROCREAM) 0.75 % cream Apply 1 application topically daily as needed (for rosacea).         Discharge Condition: Stable *  Discharge Instructions Get Medicines reviewed and adjusted: Please take all your medications with you for your next visit with your Primary MD  Please request your Primary MD to go over  all hospital tests and procedure/radiological results at the follow up, please ask your Primary MD to get all Hospital records sent to his/her office.  If you experience worsening of your admission symptoms, develop shortness of breath, life threatening emergency, suicidal or homicidal thoughts you must seek medical attention immediately by calling 911 or calling your MD immediately if symptoms less severe.  You must read complete instructions/literature along with all the possible adverse reactions/side effects for all  the Medicines you take and that have been prescribed to you. Take any new Medicines after you have completely understood and accpet all the possible adverse reactions/side effects.   Do not drive when taking Pain medications.   Do not take more than prescribed Pain, Sleep and Anxiety Medications  Special Instructions: If you have smoked or chewed Tobacco in the last 2 yrs please stop smoking, stop any regular Alcohol and or any Recreational drug use.  Wear Seat belts while driving.  Please note  You were cared for by a hospitalist during your hospital stay. Once you are discharged, your primary care physician will handle any further medical issues. Please note that NO REFILLS for any discharge medications will be authorized once you are discharged, as it is imperative that you return to your primary care physician (or establish a relationship with a primary care physician if you do not have one) for your aftercare needs so that they can reassess your need for medications and monitor your lab values.     Allergies  Allergen Reactions  . Iodine Other (See Comments)    Pt states "Deadly"  . Prednisone     Facial flushing and swelling  . Shellfish Allergy Other (See Comments)    " I almost died once"       Disposition: Discharged with home health   Consults:  Neurology   Significant Diagnostic Studies:  Ct Head Wo Contrast  Result Date: 04/19/2016 CLINICAL DATA:  Left-sided facial droop and weakness, now resolving. EXAM: CT HEAD WITHOUT CONTRAST TECHNIQUE: Contiguous axial images were obtained from the base of the skull through the vertex without intravenous contrast. COMPARISON:  04/17/2016; Brain MRI - 04/17/2016 FINDINGS: Brain: Scattered periventricular hypodensities compatible microvascular ischemic disease. There is ill-defined blurring involving the left parietal occipital gray-white junction (image 12, series 201), similar to preceding brain MRI. The gray-white  differentiation is otherwise well maintained without CT evidence of acute large territory infarct. No intraparenchymal or extra-axial mass or hemorrhage. Normal size a configuration of the ventricles and the basilar cisterns. No midline shift. Vascular: No hyperdense vessel or unexpected calcification. Skull: No displaced calvarial fracture. Sinuses/Orbits: Limited visualization the paranasal sinuses and mastoid air cells is normal. No air-fluid levels. Post bilateral cataract surgery. Other: Regional soft tissues appear normal. IMPRESSION: 1. Ill-defined blurring involving the gray-white junction within the left parietoccipital lobe, similar to recent brain MRI, favored to represent PRES. 2. Similar findings of mild microvascular ischemic disease. Electronically Signed   By: Sandi Mariscal M.D.   On: 04/19/2016 09:35   Ct Head Wo Contrast  Result Date: 04/17/2016 CLINICAL DATA:  Altered mental status EXAM: CT HEAD WITHOUT CONTRAST TECHNIQUE: Contiguous axial images were obtained from the base of the skull through the vertex without intravenous contrast. COMPARISON:  None. FINDINGS: Brain: No mass lesion, intraparenchymal hemorrhage or extra-axial collection. No evidence of acute cortical infarct. Brain parenchyma and CSF-containing spaces are normal for age. Vascular: No hyperdense vessel or unexpected calcification. Skull: Normal visualized skull base, calvarium and extracranial soft tissues.  Sinuses/Orbits: No sinus fluid levels or advanced mucosal thickening. No mastoid effusion. Normal orbits. IMPRESSION: Normal head CT for age. Electronically Signed   By: Ulyses Jarred M.D.   On: 04/17/2016 21:10   Mr Jodene Nam Head Wo Contrast  Result Date: 04/18/2016 CLINICAL DATA:  Acute onset confusion, last seen normal at noon today. Assess stroke. History of hyperlipidemia, tremor, essential thrombocytosis. EXAM: MRI HEAD WITH AND WITHOUT CONTRAST MRA HEAD WITHOUT CONTRAST MRA NECK WITHOUT AND WITH CONTRAST TECHNIQUE:  Multiplanar, multiecho pulse sequences of the brain and surrounding structures were obtained with and without intravenous contrast. Angiographic images of the Circle of Willis were obtained using MRA technique without intravenous contrast. Angiographic images of the neck were obtained using MRA technique without and with intravenous contrast. Carotid stenosis measurements (when applicable) are obtained utilizing NASCET criteria, using the distal internal carotid diameter as the denominator. CONTRAST:  16 cc MultiHance COMPARISON:  CT HEAD April 17, 2016 at 2102 hours FINDINGS: MRI HEAD FINDINGS INTRACRANIAL CONTENTS: No reduced diffusion to suggest acute ischemia. No susceptibility artifact to suggest hemorrhage. The ventricles and sulci are normal for patient's age. Abnormal enhancing FLAIR T2 hyperintense signal within the LEFT parietal and occipital lobes extending to the cortex associated reduced diffusion in bright ADC values consistent with T2 shine through. Additional patchy supratentorial white matter FLAIR T2 hyperintensities. No masses, mass effect. No abnormal extra-axial enhancement. RIGHT parietal developmental venous anomaly, benign finding. No abnormal extra-axial fluid collections. No extra-axial masses. VASCULAR: Normal major intracranial vascular flow voids present at skull base. Though not tailored for evaluation, dural venous sinuses appear patent. SKULL AND UPPER CERVICAL SPINE: No abnormal sellar expansion. No suspicious calvarial bone marrow signal. Craniocervical junction maintained. Severe LEFT temporomandibular osteoarthrosis. SINUSES/ORBITS: LEFT maxillary mucosal retention cyst without paranasal sinus air-fluid levels. Mild paranasal sinus mucosal thickening. Mastoid air cells are well aerated. The included ocular globes and orbital contents are non-suspicious. Status post bilateral ocular lens implants. OTHER: None. MRA HEAD FINDINGS ANTERIOR CIRCULATION: Normal flow related  enhancement of the included cervical, petrous, cavernous and supraclinoid internal carotid arteries. Patent anterior communicating artery. Flow related enhancement of the anterior and middle cerebral arteries, including distal segments. Moderate afocal LEFT M2 origin segment. No large vessel occlusion, high-grade stenosis, abnormal luminal irregularity, aneurysm. POSTERIOR CIRCULATION: vertebral artery is dominant. Basilar artery is patent, with normal flow related enhancement of the main branch vessels. Normal flow related enhancement of the posterior cerebral arteries. No large vessel occlusion, high-grade stenosis, abnormal luminal irregularity, aneurysm. ANATOMIC VARIANTS: None. MRA NECK FINDINGS ANTERIOR CIRCULATION: The common carotid arteries are widely patent bilaterally. The carotid bifurcation is patent bilaterally and there is no hemodynamically significant carotid stenosis by NASCET criteria. No evidence for atherosclerosis or flow limiting stenosis of the cervical internal carotid arteries. POSTERIOR CIRCULATION: Bilateral vertebral arteries are patent to the vertebrobasilar junction. No evidence for atherosclerosis or flow limiting stenosis. Source images and MIP image were reviewed. IMPRESSION: MRI HEAD: Abnormal signal LEFT parietal occipital lobe with imaging characteristics of posterior reversible encephalopathic syndrome, less likely status epilepticus. Moderate chronic small vessel ischemic disease. MRA HEAD: No emergent large vessel occlusion. Moderate stenosis LEFT M2 origin. MRA NECK:  Negative. Electronically Signed   By: Elon Alas M.D.   On: 04/18/2016 01:36   Mr Angiogram Neck W Or Wo Contrast  Result Date: 04/18/2016 CLINICAL DATA:  Acute onset confusion, last seen normal at noon today. Assess stroke. History of hyperlipidemia, tremor, essential thrombocytosis. EXAM: MRI HEAD WITH AND WITHOUT CONTRAST MRA HEAD  WITHOUT CONTRAST MRA NECK WITHOUT AND WITH CONTRAST TECHNIQUE:  Multiplanar, multiecho pulse sequences of the brain and surrounding structures were obtained with and without intravenous contrast. Angiographic images of the Circle of Willis were obtained using MRA technique without intravenous contrast. Angiographic images of the neck were obtained using MRA technique without and with intravenous contrast. Carotid stenosis measurements (when applicable) are obtained utilizing NASCET criteria, using the distal internal carotid diameter as the denominator. CONTRAST:  16 cc MultiHance COMPARISON:  CT HEAD April 17, 2016 at 2102 hours FINDINGS: MRI HEAD FINDINGS INTRACRANIAL CONTENTS: No reduced diffusion to suggest acute ischemia. No susceptibility artifact to suggest hemorrhage. The ventricles and sulci are normal for patient's age. Abnormal enhancing FLAIR T2 hyperintense signal within the LEFT parietal and occipital lobes extending to the cortex associated reduced diffusion in bright ADC values consistent with T2 shine through. Additional patchy supratentorial white matter FLAIR T2 hyperintensities. No masses, mass effect. No abnormal extra-axial enhancement. RIGHT parietal developmental venous anomaly, benign finding. No abnormal extra-axial fluid collections. No extra-axial masses. VASCULAR: Normal major intracranial vascular flow voids present at skull base. Though not tailored for evaluation, dural venous sinuses appear patent. SKULL AND UPPER CERVICAL SPINE: No abnormal sellar expansion. No suspicious calvarial bone marrow signal. Craniocervical junction maintained. Severe LEFT temporomandibular osteoarthrosis. SINUSES/ORBITS: LEFT maxillary mucosal retention cyst without paranasal sinus air-fluid levels. Mild paranasal sinus mucosal thickening. Mastoid air cells are well aerated. The included ocular globes and orbital contents are non-suspicious. Status post bilateral ocular lens implants. OTHER: None. MRA HEAD FINDINGS ANTERIOR CIRCULATION: Normal flow related  enhancement of the included cervical, petrous, cavernous and supraclinoid internal carotid arteries. Patent anterior communicating artery. Flow related enhancement of the anterior and middle cerebral arteries, including distal segments. Moderate afocal LEFT M2 origin segment. No large vessel occlusion, high-grade stenosis, abnormal luminal irregularity, aneurysm. POSTERIOR CIRCULATION: vertebral artery is dominant. Basilar artery is patent, with normal flow related enhancement of the main branch vessels. Normal flow related enhancement of the posterior cerebral arteries. No large vessel occlusion, high-grade stenosis, abnormal luminal irregularity, aneurysm. ANATOMIC VARIANTS: None. MRA NECK FINDINGS ANTERIOR CIRCULATION: The common carotid arteries are widely patent bilaterally. The carotid bifurcation is patent bilaterally and there is no hemodynamically significant carotid stenosis by NASCET criteria. No evidence for atherosclerosis or flow limiting stenosis of the cervical internal carotid arteries. POSTERIOR CIRCULATION: Bilateral vertebral arteries are patent to the vertebrobasilar junction. No evidence for atherosclerosis or flow limiting stenosis. Source images and MIP image were reviewed. IMPRESSION: MRI HEAD: Abnormal signal LEFT parietal occipital lobe with imaging characteristics of posterior reversible encephalopathic syndrome, less likely status epilepticus. Moderate chronic small vessel ischemic disease. MRA HEAD: No emergent large vessel occlusion. Moderate stenosis LEFT M2 origin. MRA NECK:  Negative. Electronically Signed   By: Elon Alas M.D.   On: 04/18/2016 01:36   Mr Jeri Cos [id number] Contrast  Result Date: 04/18/2016 CLINICAL DATA:  Acute onset confusion, last seen normal at noon today. Assess stroke. History of hyperlipidemia, tremor, essential thrombocytosis. EXAM: MRI HEAD WITH AND WITHOUT CONTRAST MRA HEAD WITHOUT CONTRAST MRA NECK WITHOUT AND WITH CONTRAST TECHNIQUE: Multiplanar,  multiecho pulse sequences of the brain and surrounding structures were obtained with and without intravenous contrast. Angiographic images of the Circle of Willis were obtained using MRA technique without intravenous contrast. Angiographic images of the neck were obtained using MRA technique without and with intravenous contrast. Carotid stenosis measurements (when applicable) are obtained utilizing NASCET criteria, using the distal internal carotid diameter as the denominator.  CONTRAST:  16 cc MultiHance COMPARISON:  CT HEAD April 17, 2016 at 2102 hours FINDINGS: MRI HEAD FINDINGS INTRACRANIAL CONTENTS: No reduced diffusion to suggest acute ischemia. No susceptibility artifact to suggest hemorrhage. The ventricles and sulci are normal for patient's age. Abnormal enhancing FLAIR T2 hyperintense signal within the LEFT parietal and occipital lobes extending to the cortex associated reduced diffusion in bright ADC values consistent with T2 shine through. Additional patchy supratentorial white matter FLAIR T2 hyperintensities. No masses, mass effect. No abnormal extra-axial enhancement. RIGHT parietal developmental venous anomaly, benign finding. No abnormal extra-axial fluid collections. No extra-axial masses. VASCULAR: Normal major intracranial vascular flow voids present at skull base. Though not tailored for evaluation, dural venous sinuses appear patent. SKULL AND UPPER CERVICAL SPINE: No abnormal sellar expansion. No suspicious calvarial bone marrow signal. Craniocervical junction maintained. Severe LEFT temporomandibular osteoarthrosis. SINUSES/ORBITS: LEFT maxillary mucosal retention cyst without paranasal sinus air-fluid levels. Mild paranasal sinus mucosal thickening. Mastoid air cells are well aerated. The included ocular globes and orbital contents are non-suspicious. Status post bilateral ocular lens implants. OTHER: None. MRA HEAD FINDINGS ANTERIOR CIRCULATION: Normal flow related enhancement of the  included cervical, petrous, cavernous and supraclinoid internal carotid arteries. Patent anterior communicating artery. Flow related enhancement of the anterior and middle cerebral arteries, including distal segments. Moderate afocal LEFT M2 origin segment. No large vessel occlusion, high-grade stenosis, abnormal luminal irregularity, aneurysm. POSTERIOR CIRCULATION: vertebral artery is dominant. Basilar artery is patent, with normal flow related enhancement of the main branch vessels. Normal flow related enhancement of the posterior cerebral arteries. No large vessel occlusion, high-grade stenosis, abnormal luminal irregularity, aneurysm. ANATOMIC VARIANTS: None. MRA NECK FINDINGS ANTERIOR CIRCULATION: The common carotid arteries are widely patent bilaterally. The carotid bifurcation is patent bilaterally and there is no hemodynamically significant carotid stenosis by NASCET criteria. No evidence for atherosclerosis or flow limiting stenosis of the cervical internal carotid arteries. POSTERIOR CIRCULATION: Bilateral vertebral arteries are patent to the vertebrobasilar junction. No evidence for atherosclerosis or flow limiting stenosis. Source images and MIP image were reviewed. IMPRESSION: MRI HEAD: Abnormal signal LEFT parietal occipital lobe with imaging characteristics of posterior reversible encephalopathic syndrome, less likely status epilepticus. Moderate chronic small vessel ischemic disease. MRA HEAD: No emergent large vessel occlusion. Moderate stenosis LEFT M2 origin. MRA NECK:  Negative. Electronically Signed   By: Elon Alas M.D.   On: 04/18/2016 01:36   Dg Chest Port 1 View  Result Date: 04/18/2016 CLINICAL DATA:  Pneumonia. EXAM: PORTABLE CHEST 1 VIEW COMPARISON:  Radiographs of November 20, 2013. FINDINGS: The heart size and mediastinal contours are within normal limits. Both lungs are clear. Atherosclerosis of thoracic aorta is noted. No pneumothorax or pleural effusion is noted. The  visualized skeletal structures are unremarkable. IMPRESSION: Aortic atherosclerosis.  No acute cardiopulmonary abnormality seen. Electronically Signed   By: Marijo Conception, M.D.   On: 04/18/2016 09:00    2-D echo LV EF: 60% -   65%  ------------------------------------------------------------------- Indications:      CVA 436.  ------------------------------------------------------------------- History:   PMH:  No prior cardiac history.  ------------------------------------------------------------------- Study Conclusions  - Left ventricle: The cavity size was normal. Wall thickness was   normal. Systolic function was normal. The estimated ejection   fraction was in the range of 60% to 65%. Wall motion was normal;   there were no regional wall motion abnormalities. Features are   consistent with a pseudonormal left ventricular filling pattern,   with concomitant abnormal relaxation and increased filling  pressure (grade 2 diastolic dysfunction). - Aortic valve: Valve area (VTI): 2.83 cm^2. Valve area (Vmax):   3.11 cm^2. Valve area (Vmean): 2.88 cm^2.       Labs: Results for orders placed or performed during the hospital encounter of 04/17/16 (from the past 48 hour(s))  Sodium, urine, random     Status: None   Collection Time: 04/20/16  2:05 PM  Result Value Ref Range   Sodium, Ur 54 mmol/L  ACTH stimulation, 3 time points     Status: None   Collection Time: 04/21/16  4:01 AM  Result Value Ref Range   Cortisol, Base 18.9 ug/dL    Comment: NO NORMAL RANGE ESTABLISHED FOR THIS TEST   Cortisol, 30 Min 25.4 ug/dL   Cortisol, 60 Min 32.0 ug/dL  CBC     Status: Abnormal   Collection Time: 04/22/16  3:10 AM  Result Value Ref Range   WBC 9.7 4.0 - 10.5 K/uL   RBC 3.61 (L) 3.87 - 5.11 MIL/uL   Hemoglobin 10.7 (L) 12.0 - 15.0 g/dL   HCT 33.7 (L) 36.0 - 46.0 %   MCV 93.4 78.0 - 100.0 fL   MCH 29.6 26.0 - 34.0 pg   MCHC 31.8 30.0 - 36.0 g/dL   RDW 13.8 11.5 - 15.5 %    Platelets 540 (H) 150 - 400 K/uL     Lipid Panel     Component Value Date/Time   CHOL 162 04/18/2016 0513   TRIG 58 04/18/2016 0513   HDL 44 04/18/2016 0513   CHOLHDL 3.7 04/18/2016 0513   VLDL 12 04/18/2016 0513   LDLCALC 106 (H) 04/18/2016 0513     Lab Results  Component Value Date   HGBA1C 5.7 (H) 04/18/2016     Lab Results  Component Value Date   LDLCALC 106 (H) 04/18/2016   CREATININE 0.88 04/19/2016   EEG 04/18/2016 EEG Abnormalities:  1) asymmetric PDR 2) focal predominance of delta in the left posterior quadrant 3) generalized irregular slow activity 4) slow PDR  HPI   Deanna Hill an 76 y.o.femalewho presented for assessment of acute confusion with headache and nausea. Symptoms first noticed by her daughter at 7:30 PM on Friday while speaking with her mother over the phone. The patient had difficulty with word finding. On arrival to the ED she was unable to follow simple commands.During this course, the patient was found to be hypertensive with imaging studies consistent with PRES. Patient was also found to be orthostatic. Neurology had been following and adjusted meds for migraines  HOSPITAL COURSE:    Possible PRES VS Complicated Migraine given MRI results:  Unwitnessed seizure with post ictal confusion less likely  Resultant  Visual field loss and confusion  MRI - Abnormal signal LEFT parietal occipital lobe with imaging characteristics of posterior reversible encephalopathic syndrome, less likely status epilepticus.  MRA - Moderate stenosis LEFT M2 origin.   MRA Neck - negative Repeat Head CT 04/19/16 Ill-defined blurring involving the gray-white junction within the left parietoccipital lobe, similar to recent brain MRI, favored to  represent PRES.  EEG - see report above.  Carotid Doppler - MRA neck  2D Echo - EF 60-65%. No cardiac source of emboli identified.  LDL - 106  HgbA1c 5.7  aspirin 325 mg daily prior to admission, now on  aspirin 325 mg daily Will need repeat MRI w/wo in 2-3 months to follow abn MRI findings F/u as outpatient with neurology in 6 weeks  2. Hypothyroidism: - Will continue  thyroid replacement as tolerated - Stable at present  3. Hypertensive crisis: - No previous history of HTN. -No CVA on MRI -BP normalized overnight with lisinopril 5mg  and metoprolol 25mg  bid -pt is continued on lisinopril and Norvasc    4. Thrombocytosis: - Platelets improved to 578 at last check -Most recent plt count demonstrates improvement    5. Leukocytosis - WBC improved to 11.1 - Presently afebrile  6. Orthostatic hypotension - AM random cortisol low at 3.5 - Cosyntropin stim test with base cortisol 18.9 to 32.   orthostatics improved with IV fluids  7. Migraine Headaches - Neurology following - Recommendations to stop depakote and try topamax 25mg  BID   Discharge Exam:  Blood pressure (!) 148/72, pulse 76, temperature 98.5 F (36.9 C), temperature source Oral, resp. rate 17, weight 73.1 kg (161 lb 2.5 oz), SpO2 98 %.  General exam: awake, in nad, conversant Respiratory system: normal chest rise, no audible wheezing Cardiovascular system: regular rate, s1-2  Gastrointestinal system: soft, nondistended, pos BS Central nervous system: cn2-12 grossly intact, strength intact Extremities: perfused, no clubbing Skin: normal skin turgor, no notable skin lesions seen Psychiatry: mood normal// no auditory hallucinations     Follow-up Information    Gennette Pac, MD. Call.   Specialty:  Family Medicine Why:  follow up in 2-3 days , needs repeat orthostatics at PCP  Contact information: [place] [address] [phone]        Antony Contras, MD. Call.   Specialties:  Neurology, Radiology Why:  Call to make appointment, will need repeat MRI as per Dr.sethi Contact information: [address] [place] [address] [phone]            Signed: Reyne Dumas 04/22/2016, 8:45 AM        Time spent >45 mins

## 2016-04-22 NOTE — Progress Notes (Signed)
Occupational Therapy Treatment Patient Details Name: BEONCA NEWSUM MRN: [id number] DOB: 12/30/1940 Today's Date: 04/22/2016    History of present illness 76 yo female admitted through the ED on 04/17/16 with decreased word finding, with confusion with PRES. PMHx: Hypothyroidism, essential thrombocytosis    OT comments  Pt significantly improved.  She requires supervision for ADLs.  She was able to perform simple path finding with supervision and no cues.  She scored 27/30 on the [facility] which WNL.   She is eager to discharge.  Instructed her to sit to shower, and to use RW at home.    Follow Up Recommendations  Home health OT;Supervision/Assistance - 24 hour    Equipment Recommendations  3 in 1 bedside commode    Recommendations for Other Services      Precautions / Restrictions Precautions Precautions: Fall       Mobility Bed Mobility Overal bed mobility: Independent                Transfers Overall transfer level: Needs assistance Equipment used: None;Rolling walker (2 wheeled) Transfers: Sit to/from [facility] Sit to Stand: Supervision Stand pivot transfers: Supervision            Balance Overall balance assessment: Needs assistance Sitting-balance support: Feet supported Sitting balance-Leahy Scale: Good     Standing balance support: No upper extremity supported Standing balance-Leahy Scale: Fair                     ADL Overall ADL's : Needs assistance/impaired Eating/Feeding: Independent   Grooming: Wash/dry hands;Wash/dry face;Oral care;Supervision/safety;Standing   Upper Body Bathing: Set up   Lower Body Bathing: Supervison/ safety;Sit to/from stand   Upper Body Dressing : Set up;Sitting   Lower Body Dressing: Supervision/safety;Sit to/from stand   Toilet Transfer: Supervision/safety;Ambulation;Comfort height toilet;Grab bars   Toileting- Clothing Manipulation and Hygiene: Supervision/safety;Sit to/from Psychologist, educational Details (indicate cue type and reason): Pt instructed to use shower seat at home  Functional mobility during ADLs: Supervision/safety;Rolling walker        Vision                     Perception     Praxis      Cognition   Behavior During Therapy: [facility] for tasks assessed/performed Overall Cognitive Status: Within Functional Limits for tasks assessed                  General Comments: Pt mildly slow to process, but as she became less reserved, response time improved.  She scored 27/30 on the [facility] with is [facility].  She was able to perofrm simple path  finding on unit without cuing     Extremity/Trunk Assessment               Exercises     Shoulder Instructions       General Comments      Pertinent Vitals/ Pain       Pain Assessment: No/denies pain  Home Living                                          Prior Functioning/Environment              Frequency  Min 3X/week        Progress Toward Goals  OT Goals(current goals can now be found  in the care plan section)  Progress towards OT goals: Progressing toward goals     Plan Discharge plan remains appropriate    Co-evaluation                 End of Session Equipment Utilized During Treatment: Gait belt;Rolling walker   Activity Tolerance Patient tolerated treatment well   Patient Left in bed;with call bell/phone within reach;with bed alarm set   Nurse Communication Mobility status        Time: [id number] OT Time Calculation (min): 40 min  Charges: OT General Charges $OT Visit: 1 Procedure OT Treatments $Self Care/Home Management : 8-22 mins $Therapeutic Activity: 23-37 mins  Aedon Deason M 04/22/2016, 6:20 PM

## 2016-04-22 NOTE — Progress Notes (Signed)
Physical Therapy Treatment Patient Details Name: Deanna Hill MRN: [id number] DOB: 11/27/1940 Today's Date: 04/22/2016    History of Present Illness 76 yo female admitted through the ED on 04/17/16 with decreased word finding, with confusion with PRES. PMHx: Hypothyroidism, essential thrombocytosis     PT Comments    Pt doing well; continue to recommend HHPT, advised pt to use RW until HHPT advises otherwise; recommend incr supervision at least initially upon return home; pt is a fall risk having scored a 41 on the BERG on last PT visit  Follow Up Recommendations  Home health PT;Supervision for mobility/OOB     Equipment Recommendations  Rolling walker with 5" wheels    Recommendations for Other Services       Precautions / Restrictions Precautions Precautions: Fall Restrictions Weight Bearing Restrictions: No    Mobility  Bed Mobility Overal bed mobility: Needs Assistance Bed Mobility: Supine to Sit     Supine to sit: Modified independent (Device/Increase time)        Transfers Overall transfer level: Needs assistance Equipment used: None;Rolling walker (2 wheeled) Transfers: Sit to/from Stand Sit to Stand: Supervision         General transfer comment: cues for safety and hand placement when standign at RW  Ambulation/Gait Ambulation/Gait assistance: Min guard;Supervision Ambulation Distance (Feet): 400 Feet Assistive device: Rolling walker (2 wheeled);None Gait Pattern/deviations: Step-through pattern;Decreased stride length;Trunk flexed;Narrow base of support     General Gait Details: 250' without RW, pt with slow, guarded pace but no overt LOB; 150' with RW--step length incr with RW, cues for posture and upward gaze   Stairs            Wheelchair Mobility    Modified Rankin (Stroke Patients Only)       Balance   Sitting-balance support: No upper extremity supported;Feet supported Sitting balance-Leahy Scale: Good     Standing  balance support: No upper extremity supported Standing balance-Leahy Scale: Fair Standing balance comment: requires at least unilateral UE support for dynamic standing; no LOB during gait without AD but pt very slow/hesitant                    Cognition Arousal/Alertness: Awake/alert Behavior During Therapy: WFL for tasks assessed/performed         Memory: Decreased short-term memory Following Commands: Follows multi-step commands with increased time     Problem Solving: Slow processing General Comments: Pt able to follow one-step commands well but requires increased time and cueing for multi-step commands at times.    Exercises      General Comments        Pertinent Vitals/Pain Pain Assessment: No/denies pain    Home Living                      Prior Function            PT Goals (current goals can now be found in the care plan section) Acute Rehab PT Goals Patient Stated Goal: To get better. PT Goal Formulation: With patient Time For Goal Achievement: 05/02/16 Potential to Achieve Goals: Good Progress towards PT goals: Progressing toward goals    Frequency    Min 3X/week      PT Plan Current plan remains appropriate;Frequency needs to be updated    Co-evaluation             End of Session Equipment Utilized During Treatment: Gait belt Activity Tolerance: Patient tolerated treatment well Patient left: in  chair;with call bell/phone within reach;with chair alarm set     Time: 1131-1148 PT Time Calculation (min) (ACUTE ONLY): 17 min  Charges:  $Gait Training: 8-22 mins                    G Codes:      Domingos Riggi 05-11-2016, 12:48 PM

## 2016-04-22 NOTE — Care Management Note (Signed)
Case Management Note  Patient Details  Name: Deanna Hill MRN: [id number] Date of Birth: 25-Mar-1940  Subjective/Objective:                    Action/Plan: Pt discharging home with orders for [facility] services. CM met with the patient and provided her a list of [place] services. She selected [facility]. Santiago Glad with [facility] notified and accepted the referral.  Pt with orders for walker and 3 in1. Pt refused 3 in 1 but feels she needs the walker. Brad with [facility] notified and will deliver the equipment to the room.  Pt states she has transportation home.   Expected Discharge Date:  04/24/16               Expected Discharge Plan:  [place]  In-House Referral:     Discharge planning Services  CM Consult  Post Acute Care Choice:  Durable Medical Equipment, Home Health Choice offered to:  Patient  DME Arranged:  Walker rolling (Pt refused 3 in 1) DME Agency:  [place]:  PT, OT, Speech Therapy [place] Agency:  [place]  Status of Service:  Completed, signed off  If discussed at [place] of Stay Meetings, dates discussed:    Additional Comments:  Pollie Friar, RN 04/22/2016, 12:06 PM

## 2016-04-22 NOTE — Progress Notes (Signed)
Patient aware that she will be discharged home today. States she does not have transportation home until this evening. Aware she can stay in her room until family is available to take her home. Deanna Hill

## 2016-04-28 ENCOUNTER — Ambulatory Visit: Payer: Medicare Other | Admitting: Podiatry

## 2016-05-01 ENCOUNTER — Telehealth: Payer: Self-pay | Admitting: Neurology

## 2016-05-01 NOTE — Telephone Encounter (Signed)
Dr Jannifer Franklin- ok to fit in a work in [place]? Your first available work in [place] is on 05/12/16 at 66. Please advise

## 2016-05-01 NOTE — Telephone Encounter (Signed)
    OK to work in for revisit.

## 2016-05-01 NOTE — Telephone Encounter (Signed)
Pt's daughter called says pt was seen at ED on 04/17/16 with dx of PRES. Dr Viona Gilmore has an opening in May but she wants her seen sooner. Please call

## 2016-05-01 NOTE — Telephone Encounter (Signed)
Called and spoke with daughter. Scheduled appt for 05/22/16 at 12pm. Offered 05/12/16 at 0730 but she declined. Wanted later appt. Advised them to check in by 1130am.

## 2016-05-04 ENCOUNTER — Telehealth: Payer: Self-pay | Admitting: Neurology

## 2016-05-04 MED ORDER — ZONISAMIDE 25 MG PO CAPS: ORAL_CAPSULE | ORAL | 2 refills | Status: DC

## 2016-05-04 NOTE — Telephone Encounter (Signed)
Deanna Hill with [facility] calling stating the patient stopped taking topiramate (TOPAMAX) 25 MG tablet last week because of having diarrhea. She also stopped taking atorvastatin (LIPITOR) 40 MG tablet. She states the patient is having headaches which increases to a 9 out of 10 later in the day. Her BP is controlled. Please call to discuss.

## 2016-05-04 NOTE — Addendum Note (Signed)
Addended by: Margette Fast on: 05/04/2016 05:34 PM   Modules accepted: Orders

## 2016-05-04 NOTE — Telephone Encounter (Signed)
Dr Willis- please advise 

## 2016-05-04 NOTE — Telephone Encounter (Signed)
I called patient. The patient was in the hospital with headache and confusion, possible PRES syndrome. The patient has had headaches that have been improved to some degree on Topamax, but she could not tolerate the medication secondary to diarrhea. She has stopped the medication, I will start Zonegran instead and workup slowly to see how she does with the headache.

## 2016-05-05 NOTE — Telephone Encounter (Signed)
I called patient. The patient was afraid to take Zonegran, fearing side effects. The patient indicates that her headaches seem to be improving, may wish to wait to see if the headaches resolve before taking any medication. If the headaches do come back, she can try the Zonegran at that time.

## 2016-05-05 NOTE — Telephone Encounter (Signed)
Patient's daughter is calling and has concerns about the patient taking zonisamide (ZONEGRAN) 25 MG capsule.

## 2016-05-05 NOTE — Telephone Encounter (Signed)
Dr Willis- please advise 

## 2016-05-08 ENCOUNTER — Telehealth: Payer: Self-pay

## 2016-05-08 NOTE — Telephone Encounter (Signed)
Received phone call from pt regarding lab report results that she had done at her pcp's office. Pt states that her platelets are up to 811. Pt stated that she had missed some doses at the beginning of the month due to a recent hospitalization for swelling in her brain. 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 Pt states that her BP was also very high at the time. Since discharge, pt had been following up with pcp and will be seeing a neurologist in the next week or so. Pt states that she checks her BP everyday and it has been somewhat high. Pt is on 2 new BP meds and on ASA daily. Asked if pt is back on her hydrea 3x per week. Pt states that she only takes it on Monday and Thursday. She was not aware that she needed to take it 3x per week. Discussed with Dr.Gudena and pt to take 3x per week. Pt to follow up in 1 month to have labs and see Dr.Gudena. Pt will start taking her medication 3x per week. Told pt to also contact her pcp to see if they can recheck her lab work in 2 weeks to see if there are any changes. Pt verbalized understanding and will follow medication instructions as directed on the bottle, which is Monday, Wed, Friday. Pt recent bp this morning 160/82. Pt states that she feels very concerned that she will have a stroke again. Pt does state that she has had some intermittent headaches which her doctors are aware of. Pt will contact her pcp and will see neurologist next week or so. Pt appreciative of time and medication clarification. Confirmed date and time for lab/md appt on 3/20. Told pt to call with new results of platelets when she gets updated lab work results.

## 2016-05-22 ENCOUNTER — Encounter: Payer: Self-pay | Admitting: Neurology

## 2016-05-22 ENCOUNTER — Ambulatory Visit (INDEPENDENT_AMBULATORY_CARE_PROVIDER_SITE_OTHER): Payer: Medicare Other | Admitting: Neurology

## 2016-05-22 VITALS — BP 98/50 | HR 90 | Ht 69.0 in | Wt 154.0 lb

## 2016-05-22 DIAGNOSIS — I6783 Posterior reversible encephalopathy syndrome: Secondary | ICD-10-CM | POA: Diagnosis not present

## 2016-05-22 DIAGNOSIS — G250 Essential tremor: Secondary | ICD-10-CM

## 2016-05-22 DIAGNOSIS — I951 Orthostatic hypotension: Secondary | ICD-10-CM

## 2016-05-22 DIAGNOSIS — G903 Multi-system degeneration of the autonomic nervous system: Secondary | ICD-10-CM | POA: Insufficient documentation

## 2016-05-22 HISTORY — DX: Orthostatic hypotension: I95.1

## 2016-05-22 MED ORDER — GABAPENTIN 100 MG PO CAPS: 100.0000 mg | ORAL_CAPSULE | Freq: Three times a day (TID) | ORAL | 2 refills | Status: DC

## 2016-05-22 NOTE — Patient Instructions (Signed)
   We will start gabapentin 100 mg capsules for the headache and tremor.  Neurontin (gabapentin) may result in drowsiness, ankle swelling, gait instability, or possibly dizziness. Please contact our office if significant side effects occur with this medication.

## 2016-05-22 NOTE — Progress Notes (Signed)
Reason for visit: PRES  Referring physician: [facility]  Deanna Hill is a 76 y.o. female  History of present illness:  Deanna Hill is a 76 year old right-handed white female with history of an essential tremor. The patient recently was in the hospital on 04/17/2016 with what was felt to be a PRES syndrome. The patient had been having headaches for about 3 weeks prior to the admission. She was noted to be confused by the family members, when she came to the hospital, she was noted to have a systolic blood pressure close to 250. The patient underwent MRI of the brain, MRI was consistent with PRES. the patient was admitted for further evaluation. MRA evaluation of the head and neck was relatively unremarkable, moderate stenosis of the left M2 segment was noted. An EEG study was done and showed some slowing of the left posterior aspects of the head, no epileptiform discharges were seen. A 2-D echocardiogram was performed and showed an ejection fraction of 60-65%. No significant down the valvular abnormalities were noted. The patient has been placed on medications for blood pressure, she was noted to be orthostatic in the hospital, and she remains orthostatic at this time. She is on lisinopril and Norvasc currently. She continues to have tremor, and she continues to have headaches as well. The headaches are daily in nature. The patient does have some neck stiffness and shoulder discomfort. The headaches are better in the morning, worse as the day goes on. She was placed on Topamax, but she could not tolerate the medication, a prescription for Zonegran was called in, but she decided not take medication for fear of potential side effects. She returns to the office today for an evaluation.  Past Medical History:  Diagnosis Date  . Anxiety   . Essential tremor 06/19/2015  . GERD (gastroesophageal reflux disease)   . Hyperlipidemia   . Hypothyroidism   . IBS (irritable bowel syndrome)   .  Orthostatic hypotension 05/22/2016  . Thrombocytosis ([place]) 03/11/2011    Past Surgical History:  Procedure Laterality Date  . ABDOMINAL HYSTERECTOMY    . CATARACT EXTRACTION    . CHOLECYSTECTOMY, LAPAROSCOPIC    . LEFT HEART CATHETERIZATION WITH CORONARY ANGIOGRAM N/A 06/08/2013   Procedure: LEFT HEART CATHETERIZATION WITH CORONARY ANGIOGRAM;  Surgeon: Jacolyn Reedy, MD;  Location: [facility] CATH LAB;  Service: Cardiovascular;  Laterality: N/A;  . TOE SURGERY    . TONSILLECTOMY      Family History  Problem Relation Age of Onset  . Skin cancer Mother     Social history:  reports that she has never smoked. She has never used smokeless tobacco. She reports that she does not drink alcohol or use drugs.  Medications:  Prior to Admission medications   Medication Sig Start Date End Date Taking? Authorizing Provider  acetaminophen (TYLENOL) 325 MG tablet Take 650 mg by mouth daily as needed for mild pain.   Yes Historical Provider, MD  ALPRAZolam (XANAX) 0.25 MG tablet Take 0.5 tablets (0.125 mg total) by mouth 3 (three) times daily as needed for anxiety. 06/19/15  Yes Kathrynn Ducking, MD  amLODipine (NORVASC) 5 MG tablet Take 1 tablet (5 mg total) by mouth daily. 04/22/16  Yes Reyne Dumas, MD  aspirin 325 MG tablet Take 325 mg by mouth daily.    Yes Historical Provider, MD  BIOTIN PO Take 1 capsule by mouth daily.   Yes Historical Provider, MD  Cholecalciferol (VITAMIN D) 2000 UNITS CAPS Take 1 capsule by  mouth daily.   Yes Historical Provider, MD  Evening Primrose Oil 500 MG CAPS Take 1 capsule by mouth 2 (two) times daily.   Yes Historical Provider, MD  hydroxyurea (HYDREA) 500 MG capsule TAKE 1 CAPSULE (500 MG TOTAL) BY MOUTH AS DIRECTED. TAKE MONDAY WEDNESDAY AND FRIDAY. Patient taking differently: TAKE 1 CAPSULE (500 MG TOTAL) BY MOUTH AS DIRECTED. TAKE Monday. 01/18/15  Yes Nicholas Lose, MD  levothyroxine (SYNTHROID, LEVOTHROID) 100 MCG tablet Take 100 mcg by mouth daily.    Yes Historical  Provider, MD  lisinopril (PRINIVIL,ZESTRIL) 5 MG tablet Take 1 tablet (5 mg total) by mouth daily. 04/22/16  Yes Reyne Dumas, MD  metroNIDAZOLE (METROCREAM) 0.75 % cream Apply 1 application topically daily as needed (for rosacea).   Yes Historical Provider, MD  nitroGLYCERIN (NITROSTAT) 0.4 MG SL tablet Place 0.4 mg under the tongue every 5 (five) minutes as needed for chest pain.   Yes Historical Provider, MD  ranitidine (ZANTAC) 150 MG capsule Take 150 mg by mouth 2 (two) times daily.    Yes Historical Provider, MD  gabapentin (NEURONTIN) 100 MG capsule Take 1 capsule (100 mg total) by mouth 3 (three) times daily. 05/22/16   Kathrynn Ducking, MD      Allergies  Allergen Reactions  . Iodine Other (See Comments)    Pt states "Deadly"  . Prednisone     Facial flushing and swelling  . Shellfish Allergy Other (See Comments)    " I almost died once"     ROS:  Out of a complete 14 system review of symptoms, the patient complains only of the following symptoms, and all other reviewed systems are negative.  Fatigue Chest tightness Palpitations of the heart Cold intolerance, excessive thirst Diarrhea, nausea Achy muscles, neck stiffness Dizziness, headache, numbness, facial drooping Depression  Blood pressure (!) 98/50, pulse 90, height 5\' 9"  (1.753 m), weight 154 lb (69.9 kg).  Physical Exam  General: The patient is alert and cooperative at the time of the examination.  Eyes: Pupils are equal, round, and reactive to light. Discs are flat bilaterally.  Neck: The neck is supple, no carotid bruits are noted.  Respiratory: The respiratory examination is clear.  Cardiovascular: The cardiovascular examination reveals a regular rate and rhythm, no obvious murmurs or rubs are noted.  Skin: Extremities are without significant edema.  Neurologic Exam  Mental status: The patient is alert and oriented x 3 at the time of the examination. The patient has apparent normal recent and remote  memory, with an apparently normal attention span and concentration ability.  Cranial nerves: Facial symmetry is present. There is good sensation of the face to pinprick and soft touch bilaterally. The strength of the facial muscles and the muscles to head turning and shoulder shrug are normal bilaterally. Speech is well enunciated, no aphasia or dysarthria is noted. Extraocular movements are full. Visual fields are full. The tongue is midline, and the patient has symmetric elevation of the soft palate. No obvious hearing deficits are noted.  Motor: The motor testing reveals 5 over 5 strength of all 4 extremities. Good symmetric motor tone is noted throughout.  Sensory: Sensory testing is intact to pinprick, soft touch and vibration sensation on all 4 extremities. No evidence of extinction is noted.  Coordination: Cerebellar testing reveals good finger-nose-finger and heel-to-shin bilaterally. Intention tremors are seen with finger-nose-finger bilaterally.  Gait and station: Gait is normal. Tandem gait is slightly unsteady. Romberg is negative. No drift is seen.  Reflexes: Deep  tendon reflexes are symmetric and normal bilaterally. Toes are downgoing bilaterally.   MRI brain, MRA of head and neck 04/17/16:  IMPRESSION: MRI HEAD: Abnormal signal LEFT parietal occipital lobe with imaging characteristics of posterior reversible encephalopathic syndrome, less likely status epilepticus.  Moderate chronic small vessel ischemic disease.  MRA HEAD: No emergent large vessel occlusion.  Moderate stenosis LEFT M2 origin.  MRA NECK:  Negative.  * MRI scan images were reviewed online. I agree with the written report.   2D echo 04/18/16:  Study Conclusions  - Left ventricle: The cavity size was normal. Wall thickness was   normal. Systolic function was normal. The estimated ejection   fraction was in the range of 60% to 65%. Wall motion was normal;   there were no regional wall motion  abnormalities. Features are   consistent with a pseudonormal left ventricular filling pattern,   with concomitant abnormal relaxation and increased filling   pressure (grade 2 diastolic dysfunction).   EEG 04/18/16:  EEG Abnormalities: 1) asymmetric PDR 2) focal predominance of delta in the left posterior quadrant 3) generalized irregular slow activity 4) slow PDR    Assessment/Plan:  1. Essential tremor  2. Orthostatic hypotension  3. Recent admission for PRES  4. Daily headache  The patient currently is having some problems with orthostatic hypotension, she is dropping blood pressures about 45 points from sitting to standing. She may require a small adjustment in her medication for blood pressure. She is to drink plenty of fluids, be careful with standing. The patient will follow-up in April for her next scheduled visit, we may consider repeating MRI of the brain at that time. The patient will be given a prescription for gabapentin for the tremor as well as for the headache.  Jill Alexanders MD 05/22/2016 12:50 PM  [facility] [address] [place], [place] [address]  Phone [phone] Fax [phone]

## 2016-05-25 ENCOUNTER — Telehealth: Payer: Self-pay | Admitting: Neurology

## 2016-05-25 NOTE — Telephone Encounter (Signed)
Patient called office in reference to gabapentin (NEURONTIN) 100 MG capsule patient has taken the medication two times.  Patient woke up yesterday morning with red eyes, shaky, tremors.  Patient doesn't know if it is a reaction to the medication.  Please call

## 2016-05-25 NOTE — Telephone Encounter (Signed)
I called the patient. The patient started gabapentin over the weekend, she only took 100 mg Friday night and Saturday night, she is still feeling bad 2 days later. I'm not sure that the gabapentin is the cause of this, she feels jittery and has redness of the eyes. I would wait several days until she is feeling better, retry the gabapentin, if the symptoms come on again we will have to stop the drug.

## 2016-05-26 ENCOUNTER — Encounter (HOSPITAL_COMMUNITY): Payer: Self-pay

## 2016-05-26 DIAGNOSIS — Z7982 Long term (current) use of aspirin: Secondary | ICD-10-CM | POA: Diagnosis not present

## 2016-05-26 DIAGNOSIS — Z79899 Other long term (current) drug therapy: Secondary | ICD-10-CM | POA: Diagnosis not present

## 2016-05-26 DIAGNOSIS — E039 Hypothyroidism, unspecified: Secondary | ICD-10-CM | POA: Diagnosis not present

## 2016-05-26 DIAGNOSIS — I10 Essential (primary) hypertension: Secondary | ICD-10-CM | POA: Diagnosis present

## 2016-05-26 DIAGNOSIS — Z8673 Personal history of transient ischemic attack (TIA), and cerebral infarction without residual deficits: Secondary | ICD-10-CM | POA: Diagnosis not present

## 2016-05-26 DIAGNOSIS — R5383 Other fatigue: Secondary | ICD-10-CM | POA: Insufficient documentation

## 2016-05-26 NOTE — ED Triage Notes (Signed)
Pt states BP was elevated this evening; pt states she was seen in February for same; pt started HTN med in February; Pt states she just feel back but denies pain; Pt a&ox 4 on arrival.

## 2016-05-27 ENCOUNTER — Emergency Department (HOSPITAL_COMMUNITY)
Admission: EM | Admit: 2016-05-27 | Discharge: 2016-05-27 | Disposition: A | Discharge status: Home / Self Care | Payer: Medicare Other | Attending: Emergency Medicine | Admitting: Emergency Medicine

## 2016-05-27 DIAGNOSIS — I10 Essential (primary) hypertension: Secondary | ICD-10-CM

## 2016-05-27 DIAGNOSIS — R5383 Other fatigue: Secondary | ICD-10-CM

## 2016-05-27 HISTORY — DX: Essential (primary) hypertension: I10

## 2016-05-27 LAB — BASIC METABOLIC PANEL
Anion gap: 9 (ref 5–15)
BUN: 17 mg/dL (ref 6–20)
CO2: 24 mmol/L (ref 22–32)
Calcium: 9.1 mg/dL (ref 8.9–10.3)
Chloride: 103 mmol/L (ref 101–111)
Creatinine, Ser: 1.08 mg/dL — ABNORMAL HIGH (ref 0.44–1.00)
GFR, EST AFRICAN AMERICAN: 56 mL/min — AB (ref >60)
GFR, EST NON AFRICAN AMERICAN: 49 mL/min — AB (ref >60)
Glucose, Bld: 124 mg/dL — ABNORMAL HIGH (ref 65–99)
POTASSIUM: 4.1 mmol/L (ref 3.5–5.1)
Sodium: 136 mmol/L (ref 135–145)

## 2016-05-27 LAB — CBC
HEMATOCRIT: 38.7 % (ref 36.0–46.0)
HEMOGLOBIN: 12 g/dL (ref 12.0–15.0)
MCH: 29.2 pg (ref 26.0–34.0)
MCHC: 31 g/dL (ref 30.0–36.0)
MCV: 94.2 fL (ref 78.0–100.0)
Platelets: 679 10*3/uL — ABNORMAL HIGH (ref 150–400)
RBC: 4.11 MIL/uL (ref 3.87–5.11)
RDW: 14 % (ref 11.5–15.5)
WBC: 11.9 10*3/uL — ABNORMAL HIGH (ref 4.0–10.5)

## 2016-05-27 LAB — URINALYSIS, ROUTINE W REFLEX MICROSCOPIC
BACTERIA UA: NONE SEEN
BILIRUBIN URINE: NEGATIVE
Glucose, UA: NEGATIVE mg/dL
HGB URINE DIPSTICK: NEGATIVE
KETONES UR: NEGATIVE mg/dL
NITRITE: NEGATIVE
PROTEIN: NEGATIVE mg/dL
RBC / HPF: NONE SEEN RBC/hpf (ref 0–5)
Specific Gravity, Urine: 1.004 — ABNORMAL LOW (ref 1.005–1.030)
Squamous Epithelial / LPF: NONE SEEN
pH: 5 (ref 5.0–8.0)

## 2016-05-27 LAB — I-STAT TROPONIN, ED: TROPONIN I, POC: 0 ng/mL (ref 0.00–0.08)

## 2016-05-27 MED ORDER — LISINOPRIL 10 MG PO TABS
5.0000 mg | ORAL_TABLET | Freq: Once | ORAL | Status: AC
  Administered 2016-05-27: 5 mg via ORAL
  Filled 2016-05-27: qty 1

## 2016-05-27 NOTE — ED Provider Notes (Signed)
[facility] DEPT Provider Note   CSN: [id number] Arrival date & time: 05/26/16  2329   By signing my name below, I, Delton Prairie, attest that this documentation has been prepared under the direction and in the presence of Ripley Fraise, MD  Electronically Signed: Delton Prairie, ED Scribe. 05/27/16. 2:57 AM.   History   Chief Complaint No chief complaint on file.  The history is provided by the patient. No language interpreter was used.  Hypertension  This is a new problem. The current episode started 12 to 24 hours ago. The problem has been gradually worsening. Associated symptoms include abdominal pain and headaches. Pertinent negatives include no chest pain and no shortness of breath. Nothing aggravates the symptoms. Nothing relieves the symptoms.   HPI Comments:  Deanna Hill is a 76 y.o. female, with a PMHx of PRES, GERD, HTN and hyperlipidemia, who presents to the Emergency Department complaining of elevated blood pressure which began in the afternoon yesterday after she noticed she was feeling bad. She also reports chronic frontal headache, mild abdominal pain, nausea and a chronic tremor. She notes her blood pressure progressed from being in the 150s to the 190s. Daughter notes on 02/29/2018 the pt was told her blood pressure was elevated and was advised to watch it. Pt denies fevers, vomiting, vision changes, speech difficulty, SOB, chest pain, LOC, weakness in her extremities or any other associated symptoms.   Past Medical History:  Diagnosis Date  . Anxiety   . Essential tremor 06/19/2015  . GERD (gastroesophageal reflux disease)   . Hyperlipidemia   . Hypertension   . Hypothyroidism   . IBS (irritable bowel syndrome)   . Orthostatic hypotension 05/22/2016  . Thrombocytosis ([place]) 03/11/2011    Patient Active Problem List   Diagnosis Date Noted  . Orthostatic hypotension 05/22/2016  . Difficulty in walking, not elsewhere classified   . Stroke ([place]) 04/19/2016  .  Other secondary hypertension 04/18/2016  . Cerebrovascular accident (CVA) ([place]) 04/18/2016  . PRES (posterior reversible encephalopathy syndrome)   . Cerebral embolism with cerebral infarction 04/17/2016  . Iron deficiency anemia 10/17/2015  . Essential tremor 06/19/2015  . Encounter for long-term (current) use of medications 01/12/2014  . Anemia due to chemotherapy 07/14/2013  . Angina pectoris ([place]) 06/08/2013  . Abnormal cardiovascular stress test 06/08/2013  . Hypothyroidism, acquired   . Hyperlipidemia   . Essential thrombocytosis ([place]) 03/11/2011    Past Surgical History:  Procedure Laterality Date  . ABDOMINAL HYSTERECTOMY    . CATARACT EXTRACTION    . CHOLECYSTECTOMY, LAPAROSCOPIC    . LEFT HEART CATHETERIZATION WITH CORONARY ANGIOGRAM N/A 06/08/2013   Procedure: LEFT HEART CATHETERIZATION WITH CORONARY ANGIOGRAM;  Surgeon: Jacolyn Reedy, MD;  Location: [facility] CATH LAB;  Service: Cardiovascular;  Laterality: N/A;  . TOE SURGERY    . TONSILLECTOMY      OB History    No data available       Home Medications    Prior to Admission medications   Medication Sig Start Date End Date Taking? Authorizing Provider  acetaminophen (TYLENOL) 325 MG tablet Take 650 mg by mouth daily as needed for mild pain.    Historical Provider, MD  ALPRAZolam Duanne Moron) 0.25 MG tablet Take 0.5 tablets (0.125 mg total) by mouth 3 (three) times daily as needed for anxiety. 06/19/15   Kathrynn Ducking, MD  amLODipine (NORVASC) 5 MG tablet Take 1 tablet (5 mg total) by mouth daily. 04/22/16   Reyne Dumas, MD  aspirin 325 MG  tablet Take 325 mg by mouth daily.     Historical Provider, MD  BIOTIN PO Take 1 capsule by mouth daily.    Historical Provider, MD  Cholecalciferol (VITAMIN D) 2000 UNITS CAPS Take 1 capsule by mouth daily.    Historical Provider, MD  Evening Primrose Oil 500 MG CAPS Take 1 capsule by mouth 2 (two) times daily.    Historical Provider, MD  gabapentin (NEURONTIN) 100 MG capsule Take 1  capsule (100 mg total) by mouth 3 (three) times daily. 05/22/16   Kathrynn Ducking, MD  hydroxyurea (HYDREA) 500 MG capsule TAKE 1 CAPSULE (500 MG TOTAL) BY MOUTH AS DIRECTED. TAKE MONDAY WEDNESDAY AND FRIDAY. Patient taking differently: TAKE 1 CAPSULE (500 MG TOTAL) BY MOUTH AS DIRECTED. TAKE Monday. 01/18/15   Nicholas Lose, MD  levothyroxine (SYNTHROID, LEVOTHROID) 100 MCG tablet Take 100 mcg by mouth daily.     Historical Provider, MD  lisinopril (PRINIVIL,ZESTRIL) 5 MG tablet Take 1 tablet (5 mg total) by mouth daily. 04/22/16   Reyne Dumas, MD  metroNIDAZOLE (METROCREAM) 0.75 % cream Apply 1 application topically daily as needed (for rosacea).    Historical Provider, MD  nitroGLYCERIN (NITROSTAT) 0.4 MG SL tablet Place 0.4 mg under the tongue every 5 (five) minutes as needed for chest pain.    Historical Provider, MD  ranitidine (ZANTAC) 150 MG capsule Take 150 mg by mouth 2 (two) times daily.     Historical Provider, MD    Family History Family History  Problem Relation Age of Onset  . Skin cancer Mother     Social History Social History  Substance Use Topics  . Smoking status: Never Smoker  . Smokeless tobacco: Never Used  . Alcohol use No     Allergies   Iodine; Prednisone; and Shellfish allergy   Review of Systems Review of Systems  Constitutional: Negative for fever.  Eyes: Negative for visual disturbance.  Respiratory: Negative for shortness of breath.   Cardiovascular: Negative for chest pain.  Gastrointestinal: Positive for abdominal pain and nausea. Negative for vomiting.  Neurological: Positive for headaches. Negative for syncope, speech difficulty and numbness.  All other systems reviewed and are negative.  Physical Exam Updated Vital Signs BP 171/75   Pulse 88   Temp 98.1 F (36.7 C) (Oral)   Resp 15   SpO2 99%   Physical Exam CONSTITUTIONAL: Well developed/well nourished HEAD: Normocephalic/atraumatic EYES: EOMI/PERRL, no nystagmus, no ptosis ENMT:  Mucous membranes moist NECK: supple no meningeal signs, no bruits CV: S1/S2 noted, no murmurs/rubs/gallops noted LUNGS: Lungs are clear to auscultation bilaterally, no apparent distress ABDOMEN: soft, nontender, no rebound or guarding GU:no cva tenderness NEURO:Awake/alert, face symmetric, no arm or leg drift is noted Equal 5/5 strength with hip flexion,knee flex/extension, foot dorsi/plantar flexion Cranial nerves 3/4/5/6/09/21/08/11/12 tested and intact Gait normal without ataxia No past pointing Sensation to light touch intact in all extremities Mild tremor noted which is chronic EXTREMITIES: pulses normal, full ROM SKIN: warm, color normal PSYCH: no abnormalities of mood noted  ED Treatments / Results  DIAGNOSTIC STUDIES:  Oxygen Saturation is 99% on RA, normal by my interpretation.    COORDINATION OF CARE:  2:53 AM Discussed treatment plan with pt at bedside and pt agreed to plan.  Labs (all labs ordered are listed, but only abnormal results are displayed) Labs Reviewed  BASIC METABOLIC PANEL - Abnormal; Notable for the following:       Result Value   Glucose, Bld 124 (*)    Creatinine,  Ser 1.08 (*)    GFR calc non Af Amer 49 (*)    GFR calc Af Amer 56 (*)    All other components within normal limits  CBC - Abnormal; Notable for the following:    WBC 11.9 (*)    Platelets 679 (*)    All other components within normal limits  URINALYSIS, ROUTINE W REFLEX MICROSCOPIC - Abnormal; Notable for the following:    Color, Urine STRAW (*)    Specific Gravity, Urine 1.004 (*)    Leukocytes, UA TRACE (*)    All other components within normal limits  I-STAT TROPOININ, ED    EKG  EKG Interpretation  Date/Time:  Tuesday May 26 2016 23:50:19 EDT Ventricular Rate:  106 PR Interval:  152 QRS Duration: 82 QT Interval:  332 QTC Calculation: 441 R Axis:   -42 Text Interpretation:  Sinus tachycardia Left axis deviation Possible Anterolateral infarct , age undetermined  Abnormal ECG No significant change since last tracing Confirmed by Christy Gentles  MD, [place] ([id number]) on 05/27/2016 2:43:16 AM       Radiology No results found.  Procedures Procedures (including critical care time)  Medications Ordered in ED Medications  lisinopril (PRINIVIL,ZESTRIL) tablet 5 mg (5 mg Oral Given 05/27/16 0314)     Initial Impression / Assessment and Plan / ED Course  I have reviewed the triage vital signs and the nursing notes.  Pertinent labs  results that were available during my care of the patient were reviewed by me and considered in my medical decision making (see chart for details).     Labs reassuring Pt well appearing Reports nonspecific fatigue and "not feeling well" but no focal signs of stroke No signs of ACS/CVA or other hypertensive emergency Minimal change in BP here, but pt stable, ambulatory and went to bathroom multiple times Will d/c home She is hesitant to add on any new BP meds as she reports she is sensitive to them Advised to call her PCP followup  Per records, pt has h/o PRES but no stroke noted on previous MR imaging  Final Clinical Impressions(s) / ED Diagnoses   Final diagnoses:  Essential hypertension  Other fatigue    New Prescriptions New Prescriptions   No medications on file  I personally performed the services described in this documentation, which was scribed in my presence. The recorded information has been reviewed and is accurate.        Ripley Fraise, MD 05/27/16 [phone]

## 2016-06-01 ENCOUNTER — Telehealth: Payer: Self-pay | Admitting: Hematology and Oncology

## 2016-06-01 NOTE — Telephone Encounter (Signed)
Pt called to cxl 3/2 appt and r/s to 5/1 at 1045 am

## 2016-06-02 ENCOUNTER — Other Ambulatory Visit: Payer: Medicare Other

## 2016-06-02 ENCOUNTER — Ambulatory Visit: Payer: Medicare Other | Admitting: Hematology and Oncology

## 2016-06-30 ENCOUNTER — Ambulatory Visit: Payer: Medicare Other | Admitting: Adult Health

## 2016-07-08 ENCOUNTER — Ambulatory Visit (INDEPENDENT_AMBULATORY_CARE_PROVIDER_SITE_OTHER): Payer: Medicare Other | Admitting: Adult Health

## 2016-07-08 ENCOUNTER — Encounter: Payer: Self-pay | Admitting: Adult Health

## 2016-07-08 VITALS — BP 162/80 | HR 88 | Resp 16 | Ht 69.0 in | Wt 154.0 lb

## 2016-07-08 DIAGNOSIS — G250 Essential tremor: Secondary | ICD-10-CM

## 2016-07-08 DIAGNOSIS — I6783 Posterior reversible encephalopathy syndrome: Secondary | ICD-10-CM | POA: Diagnosis not present

## 2016-07-08 NOTE — Progress Notes (Addendum)
PATIENT: Deanna Hill DOB: 07/19/1940  REASON FOR VISIT: follow up- PRES, essential tremor HISTORY FROM: patient and daughter  HISTORY OF PRESENT ILLNESS: Deanna Hill is a 76 year old female with a history of essential tremor and PRES syndrome. She returns today for follow-up. She reports that her headaches have improved since her hospitalization however she continues to have headaches in the late afternoon. She describes the headaches as a shooting pain across the forehead. She states in the morning she always feels well but this pain always comes in the afternoon. She reports sometimes it will respond to Tylenol. She has tried gabapentin and Topamax but was unable to tolerate these medications. She has kept a blood pressure log and it appears that her blood pressure is  elevated in the evenings. She recently saw her primary care and no adjustments were made to her blood pressure medicine. She reports that her tremor is slightly worse and she is not using medication. She tends to notice it with eating and her handwriting. Her daughter reports that she is very sensitive to medication. She denies any additional symptoms. She returns today for an evaluation.  HISTORY 05/22/16: Deanna Hill is a 76 year old right-handed white female with history of an essential tremor. The patient recently was in the hospital on 04/17/2016 with what was felt to be a PRES syndrome. The patient had been having headaches for about 3 weeks prior to the admission. She was noted to be confused by the family members, when she came to the hospital, she was noted to have a systolic blood pressure close to 250. The patient underwent MRI of the brain, MRI was consistent with PRES. the patient was admitted for further evaluation. MRA evaluation of the head and neck was relatively unremarkable, moderate stenosis of the left M2 segment was noted. An EEG study was done and showed some slowing of the left posterior aspects of the head,  no epileptiform discharges were seen. A 2-D echocardiogram was performed and showed an ejection fraction of 60-65%. No significant down the valvular abnormalities were noted. The patient has been placed on medications for blood pressure, she was noted to be orthostatic in the hospital, and she remains orthostatic at this time. She is on lisinopril and Norvasc currently. She continues to have tremor, and she continues to have headaches as well. The headaches are daily in nature. The patient does have some neck stiffness and shoulder discomfort. The headaches are better in the morning, worse as the day goes on. She was placed on Topamax, but she could not tolerate the medication, a prescription for Zonegran was called in, but she decided not take medication for fear of potential side effects. She returns to the office today for an evaluation.  REVIEW OF SYSTEMS: Out of a complete 14 system review of symptoms, the patient complains only of the following symptoms, and all other reviewed systems are negative.  Dizziness, headache, tremors, neck pain, neck stiffness, itching, flushing, heat intolerance, cold intolerance, eye discharge, fatigue, palpitations  ALLERGIES: Allergies  Allergen Reactions  . Iodine Other (See Comments)    Pt states "Deadly"  . Prednisone     Facial flushing and swelling  . Shellfish Allergy Other (See Comments)    " I almost died once"     HOME MEDICATIONS: Outpatient Medications Prior to Visit  Medication Sig Dispense Refill  . acetaminophen (TYLENOL) 325 MG tablet Take 650 mg by mouth daily as needed for mild pain.    [name] ALPRAZolam Duanne Moron)  0.25 MG tablet Take 0.5 tablets (0.125 mg total) by mouth 3 (three) times daily as needed for anxiety. 30 tablet 2  . amLODipine (NORVASC) 5 MG tablet Take 1 tablet (5 mg total) by mouth daily. 30 tablet 1  . aspirin 325 MG tablet Take 325 mg by mouth daily.     [name] BIOTIN PO Take 1 capsule by mouth daily.    . Cholecalciferol (VITAMIN  D) 2000 UNITS CAPS Take 1 capsule by mouth daily.    . Evening Primrose Oil 500 MG CAPS Take 1 capsule by mouth 2 (two) times daily.    [name] gabapentin (NEURONTIN) 100 MG capsule Take 1 capsule (100 mg total) by mouth 3 (three) times daily. (Patient not taking: Reported on 05/27/2016) 90 capsule 2  . hydroxyurea (HYDREA) 500 MG capsule TAKE 1 CAPSULE (500 MG TOTAL) BY MOUTH AS DIRECTED. TAKE MONDAY WEDNESDAY AND FRIDAY. (Patient taking differently: Take 500 mg by mouth every Monday, Wednesday, and Friday. ) 48 capsule 3  . levothyroxine (SYNTHROID, LEVOTHROID) 100 MCG tablet Take 100 mcg by mouth daily.     [name] lisinopril (PRINIVIL,ZESTRIL) 5 MG tablet Take 1 tablet (5 mg total) by mouth daily. 30 tablet 1  . metroNIDAZOLE (METROCREAM) 0.75 % cream Apply 1 application topically daily as needed (for rosacea).    . nitroGLYCERIN (NITROSTAT) 0.4 MG SL tablet Place 0.4 mg under the tongue every 5 (five) minutes as needed for chest pain.    . ranitidine (ZANTAC) 150 MG capsule Take 150 mg by mouth 2 (two) times daily.      No facility-administered medications prior to visit.     PAST MEDICAL HISTORY: Past Medical History:  Diagnosis Date  . Anxiety   . Essential tremor 06/19/2015  . GERD (gastroesophageal reflux disease)   . Hyperlipidemia   . Hypertension   . Hypothyroidism   . IBS (irritable bowel syndrome)   . Orthostatic hypotension 05/22/2016  . Thrombocytosis ([place]) 03/11/2011    PAST SURGICAL HISTORY: Past Surgical History:  Procedure Laterality Date  . ABDOMINAL HYSTERECTOMY    . CATARACT EXTRACTION    . CHOLECYSTECTOMY, LAPAROSCOPIC    . LEFT HEART CATHETERIZATION WITH CORONARY ANGIOGRAM N/A 06/08/2013   Procedure: LEFT HEART CATHETERIZATION WITH CORONARY ANGIOGRAM;  Surgeon: Jacolyn Reedy, MD;  Location[facility] C[facility] LAB;  Service: Cardiovascular;  Laterality: N/A;  . TOE SURGERY    . TONSILLECTOMY      FAMILY HISTORY: Family History  Problem Relation Age of Onset  . Skin cancer  Mother     SOCIAL HISTORY: Social History   Social History  . Marital status: Widowed    Spouse name: N/A  . Number of children: N/A  . Years of education: N/A   Occupational History  . Not on file.   Social History Main Topics  . Smoking status: Never Smoker  . Smokeless tobacco: Never Used  . Alcohol use No  . Drug use: No  . Sexual activity: Not Currently   Other Topics Concern  . Not on file   Social History Narrative   Lives home alone.  Retired.  12 th grade education.           PHYSICAL EXAM  Vitals:   07/08/16 1332  BP: (!) 162/80  Pulse: 88  Resp: 16  Weight: 154 lb (69.9 kg)  Height: 5\' 9"  (1.753 m)   Body mass index is 22.74 kg/m.  Generalized: Well developed, in no acute distress   Neurological examination  Mentation: Alert  oriented to time, place, history taking. Follows all commands speech and language fluent Cranial nerve II-XII: Pupils were equal round reactive to light. Extraocular movements were full, visual field were full on confrontational test. Facial sensation and strength were normal. Uvula tongue midline. Head turning and shoulder shrug  were normal and symmetric. Motor: The motor testing reveals 5 over 5 strength of all 4 extremities. Good symmetric motor tone is noted throughout. Mild intention tremor noted in the hands. Sensory: Sensory testing is intact to soft touch on all 4 extremities. No evidence of extinction is noted.  Coordination: Cerebellar testing reveals good finger-nose-finger and heel-to-shin bilaterally.  Gait and station: Gait is normal.  Reflexes: Deep tendon reflexes are symmetric and normal bilaterally.   DIAGNOSTIC DATA (LABS, IMAGING, TESTING) - I reviewed patient records, labs, notes, testing and imaging myself where available.  Lab Results  Component Value Date   WBC 11.9 (H) 05/26/2016   HGB 12.0 05/26/2016   HCT 38.7 05/26/2016   MCV 94.2 05/26/2016   PLT 679 (H) 05/26/2016      Component Value  Date/Time   NA 136 05/26/2016 2348   NA 140 04/19/2015 1107   K 4.1 05/26/2016 2348   K 4.2 04/19/2015 1107   CL 103 05/26/2016 2348   CL 107 06/24/2012 0859   CO2 24 05/26/2016 2348   CO2 25 04/19/2015 1107   GLUCOSE 124 (H) 05/26/2016 2348   GLUCOSE 94 04/19/2015 1107   GLUCOSE 81 06/24/2012 0859   BUN 17 05/26/2016 2348   BUN 21.1 04/19/2015 1107   CREATININE 1.08 (H) 05/26/2016 2348   CREATININE 0.9 04/19/2015 1107   CALCIUM 9.1 05/26/2016 2348   CALCIUM 9.1 04/19/2015 1107   PROT 6.4 (L) 04/18/2016 0513   PROT 6.9 04/19/2015 1107   ALBUMIN 4.0 04/18/2016 0513   ALBUMIN 3.9 04/19/2015 1107   AST 23 04/18/2016 0513   AST 14 04/19/2015 1107   ALT 19 04/18/2016 0513   ALT 12 04/19/2015 1107   ALKPHOS 43 04/18/2016 0513   ALKPHOS 50 04/19/2015 1107   BILITOT 0.8 04/18/2016 0513   BILITOT <0.30 04/19/2015 1107   GFRNONAA 49 (L) 05/26/2016 2348   GFRAA 56 (L) 05/26/2016 2348   Lab Results  Component Value Date   CHOL 162 04/18/2016   HDL 44 04/18/2016   LDLCALC 106 (H) 04/18/2016   TRIG 58 04/18/2016   CHOLHDL 3.7 04/18/2016   Lab Results  Component Value Date   HGBA1C 5.7 (H) 04/18/2016   Lab Results  Component Value Date   VITAMINB12 964 04/16/2016   Lab Results  Component Value Date   TSH 1.259 04/19/2016      ASSESSMENT AND PLAN 76 y.o. year old female  has a past medical history of Anxiety; Essential tremor (06/19/2015); GERD (gastroesophageal reflux disease); Hyperlipidemia; Hypertension; Hypothyroidism; IBS (irritable bowel syndrome); Orthostatic hypotension (05/22/2016); and Thrombocytosis ([place]) (03/11/2011). here with:  1. PRES 2. Essential tremor  The patient continues to have headaches in the late afternoon. Her blood pressure is also elevated at this time. Unsure if this is correlated. We will send the patient for an MRI of the brain to reevaluate after diagnosis of PRES. once we have results of the MRI we will consider trying an additional  medication if the patient continues to have daily afternoon headaches. Patient and her daughter are amenable to this plan. I've encouraged them to continue the blood pressure log. She will follow-up in 3-4 months or sooner if needed.  Ward Givens, MSN, NP-C 07/08/2016, 1:27 PM [facility] [address] [place], [place] [address] [phone]

## 2016-07-08 NOTE — Patient Instructions (Signed)
MRI brain  Will discuss medication after MRI If your symptoms worsen or you develop new symptoms please let us know.

## 2016-07-09 NOTE — Progress Notes (Signed)
I have read the note, and I agree with the clinical assessment and plan.  Fisher Hargadon KEITH   

## 2016-07-14 ENCOUNTER — Ambulatory Visit: Payer: Medicare Other | Admitting: Hematology and Oncology

## 2016-07-14 ENCOUNTER — Telehealth: Payer: Self-pay | Admitting: Hematology and Oncology

## 2016-07-14 ENCOUNTER — Other Ambulatory Visit: Payer: Medicare Other

## 2016-07-14 NOTE — Assessment & Plan Note (Deleted)
IV iron given August 2017.  Irritable bowel syndrome with diarrhea: Patient is also having issues with irritable bowel syndrome which is limiting her day-to-day activities.  Tremors, Raynaud's syndrome, hypertension:

## 2016-07-14 NOTE — Telephone Encounter (Signed)
Pt called to r/s appt due to not feeling well. Gave pt new appt date/tme

## 2016-07-14 NOTE — Assessment & Plan Note (Deleted)
Essential thrombocytosis with JAK2 mutation: Continue Hydrea 500 mg daily and also aspirin 325 mg by mouth once daily. Reduced the dosage of Hydrea 500 mg 3 times a week since 07/17/2014, reducing the dosage of Hydrea to 500 mg twice a week from 10/17/2014 and back up to 3 times a week starting 01/18/2015, reduced to twice a week on 04/19/2015 and toonce a week on 10/17/2015( due to decrease in hemoglobin), increase the dosage to twice a week in February 2018  Hydrea toxicities: 1. Fatigue  2. Hair loss  3. brittle nails   I reviewed her counts and the platelet count is  .  Return to clinic in 3 months for follow-up.

## 2016-07-16 ENCOUNTER — Encounter: Payer: Self-pay | Admitting: Adult Health

## 2016-07-23 ENCOUNTER — Other Ambulatory Visit (HOSPITAL_BASED_OUTPATIENT_CLINIC_OR_DEPARTMENT_OTHER): Payer: Medicare Other

## 2016-07-23 ENCOUNTER — Ambulatory Visit (HOSPITAL_BASED_OUTPATIENT_CLINIC_OR_DEPARTMENT_OTHER): Payer: Medicare Other | Admitting: Hematology and Oncology

## 2016-07-23 ENCOUNTER — Encounter: Payer: Self-pay | Admitting: Hematology and Oncology

## 2016-07-23 DIAGNOSIS — L658 Other specified nonscarring hair loss: Secondary | ICD-10-CM

## 2016-07-23 DIAGNOSIS — L603 Nail dystrophy: Secondary | ICD-10-CM | POA: Diagnosis not present

## 2016-07-23 DIAGNOSIS — D473 Essential (hemorrhagic) thrombocythemia: Secondary | ICD-10-CM | POA: Diagnosis not present

## 2016-07-23 DIAGNOSIS — R5383 Other fatigue: Secondary | ICD-10-CM | POA: Diagnosis not present

## 2016-07-23 DIAGNOSIS — D508 Other iron deficiency anemias: Secondary | ICD-10-CM

## 2016-07-23 LAB — IRON AND TIBC
%SAT: 12 % — ABNORMAL LOW (ref 21–57)
Iron: 42 ug/dL (ref 41–142)
TIBC: 334 ug/dL (ref 236–444)
UIBC: 292 ug/dL (ref 120–384)

## 2016-07-23 LAB — CBC WITH DIFFERENTIAL/PLATELET
BASO%: 1.4 % (ref 0.0–2.0)
Basophils Absolute: 0.1 10*3/uL (ref 0.0–0.1)
EOS ABS: 1.4 10*3/uL — AB (ref 0.0–0.5)
EOS%: 14.4 % — ABNORMAL HIGH (ref 0.0–7.0)
HCT: 37.2 % (ref 34.8–46.6)
HGB: 12.2 g/dL (ref 11.6–15.9)
LYMPH%: 10.2 % — AB (ref 14.0–49.7)
MCH: 30.3 pg (ref 25.1–34.0)
MCHC: 32.9 g/dL (ref 31.5–36.0)
MCV: 92.2 fL (ref 79.5–101.0)
MONO#: 0.6 10*3/uL (ref 0.1–0.9)
MONO%: 6.5 % (ref 0.0–14.0)
NEUT%: 67.5 % (ref 38.4–76.8)
NEUTROS ABS: 6.4 10*3/uL (ref 1.5–6.5)
Platelets: 565 10*3/uL — ABNORMAL HIGH (ref 145–400)
RBC: 4.04 10*6/uL (ref 3.70–5.45)
RDW: 15.6 % — ABNORMAL HIGH (ref 11.2–14.5)
WBC: 9.5 10*3/uL (ref 3.9–10.3)
lymph#: 1 10*3/uL (ref 0.9–3.3)

## 2016-07-23 LAB — FERRITIN: Ferritin: 14 ng/ml (ref 9–269)

## 2016-07-23 MED ORDER — HYDROXYUREA 500 MG PO CAPS: 500.0000 mg | ORAL_CAPSULE | ORAL | 3 refills | Status: DC

## 2016-07-23 NOTE — Assessment & Plan Note (Signed)
Essential thrombocytosis with JAK2 mutation: Continue Hydrea 500 mg daily and also aspirin 325 mg by mouth once daily. Reduced the dosage of Hydrea 500 mg 3 times a week since 07/17/2014, reducing the dosage of Hydrea to 500 mg twice a week from 10/17/2014 and back up to 3 times a week starting 01/18/2015, reduced to twice a week on 04/19/2015 and toonce a week on 10/17/2015( due to decrease in hemoglobin), increase the dosage to twice a week in February 2018  Hospitalization 04/17/2016 to 04/22/2016:Possible PRES (due to seizure) VS Complicated Migraine   Hydrea toxicities: 1. Fatigue  2. Hair loss  3. brittle nails   I reviewed her counts and the platelet count is  .  Return to clinic in 3 months for follow-up.

## 2016-07-23 NOTE — Progress Notes (Signed)
Patient Care Team: Hulan Fess, MD as PCP - General  DIAGNOSIS:  Encounter Diagnosis  Name Primary?  . Essential thrombocytosis (HCC)     CHIEF COMPLIANT: Follow-up on essential thrombocytosis, recent hospitalization for PRES  INTERVAL HISTORY: Deanna Hill is a 76 year old with above-mentioned history of essential thrombocytosis who was recently hospitalized in February with neurological symptoms and was diagnosed with PRES. During the hospitalization Hydrea was held and this was resumed after she left the hospital. The platelet counts have gone up when the Hydrea was held but then they returned back to her baseline which is in the 500s since that time. She was found to be severely hypertensive during the hospitalization and is now on blood pressure medications.  REVIEW OF SYSTEMS:   Constitutional: Denies fevers, chills or abnormal weight loss Eyes: Denies blurriness of vision Ears, nose, mouth, throat, and face: Denies mucositis or sore throat Respiratory: Denies cough, dyspnea or wheezes Cardiovascular: Denies palpitation, chest discomfort Gastrointestinal:  Denies nausea, heartburn or change in bowel habits Skin: Denies abnormal skin rashes Lymphatics: Denies new lymphadenopathy or easy bruising Neurological: Tremors Behavioral/Psych: Mood is stable, no new changes  Extremities: No lower extremity edema  All other systems were reviewed with the patient and are negative.  I have reviewed the past medical history, past surgical history, social history and family history with the patient and they are unchanged from previous note.  ALLERGIES:  is allergic to iodine; prednisone; and shellfish allergy.  MEDICATIONS:  Current Outpatient Prescriptions  Medication Sig Dispense Refill  . acetaminophen (TYLENOL) 325 MG tablet Take 650 mg by mouth daily as needed for mild pain.    [name] ALPRAZolam (XANAX) 0.25 MG tablet Take 0.5 tablets (0.125 mg total) by mouth 3 (three) times  daily as needed for anxiety. 30 tablet 2  . amLODipine (NORVASC) 5 MG tablet Take 1 tablet (5 mg total) by mouth daily. 30 tablet 1  . aspirin 325 MG tablet Take 325 mg by mouth daily.     [name] BIOTIN PO Take 1 capsule by mouth daily.    . Cholecalciferol (VITAMIN D) 2000 UNITS CAPS Take 1 capsule by mouth daily.    . Evening Primrose Oil 500 MG CAPS Take 1 capsule by mouth 2 (two) times daily.    [name] gabapentin (NEURONTIN) 100 MG capsule Take 1 capsule (100 mg total) by mouth 3 (three) times daily. 90 capsule 2  . hydroxyurea (HYDREA) 500 MG capsule TAKE 1 CAPSULE (500 MG TOTAL) BY MOUTH AS DIRECTED. TAKE MONDAY WEDNESDAY AND FRIDAY. (Patient taking differently: Take 500 mg by mouth every Monday, Wednesday, and Friday. ) 48 capsule 3  . levothyroxine (SYNTHROID, LEVOTHROID) 88 MCG tablet Take 100 mcg by mouth daily.     . metroNIDAZOLE (METROCREAM) 0.75 % cream Apply 1 application topically daily as needed (for rosacea).    . nitroGLYCERIN (NITROSTAT) 0.4 MG SL tablet Place 0.4 mg under the tongue every 5 (five) minutes as needed for chest pain.    . ranitidine (ZANTAC) 150 MG capsule Take 150 mg by mouth 2 (two) times daily.      No current facility-administered medications for this visit.     PHYSICAL EXAMINATION: ECOG PERFORMANCE STATUS: 1 - Symptomatic but completely ambulatory  Vitals:   07/23/16 1505  BP: (!) 148/71  Pulse: 94  Temp: 98.6 F (37 C)   Filed Weights   07/23/16 1505  Weight: 154 lb 9.6 oz (70.1 kg)    GENERAL:alert, no distress and comfortable SKIN:  skin color, texture, turgor are normal, no rashes or significant lesions EYES: normal, Conjunctiva are pink and non-injected, sclera clear OROPHARYNX:no exudate, no erythema and lips, buccal mucosa, and tongue normal  NECK: supple, thyroid normal size, non-tender, without nodularity LYMPH:  no palpable lymphadenopathy in the cervical, axillary or inguinal LUNGS: clear to auscultation and percussion with normal  breathing effort HEART: regular rate & rhythm and no murmurs and no lower extremity edema ABDOMEN:abdomen soft, non-tender and normal bowel sounds MUSCULOSKELETAL:no cyanosis of digits and no clubbing  NEURO: alert & oriented x 3 with fluent speech, Tremors EXTREMITIES: No lower extremity edema  LABORATORY DATA:  I have reviewed the data as listed   Chemistry      Component Value Date/Time   NA 136 05/26/2016 2348   NA 140 04/19/2015 1107   K 4.1 05/26/2016 2348   K 4.2 04/19/2015 1107   CL 103 05/26/2016 2348   CL 107 06/24/2012 0859   CO2 24 05/26/2016 2348   CO2 25 04/19/2015 1107   BUN 17 05/26/2016 2348   BUN 21.1 04/19/2015 1107   CREATININE 1.08 (H) 05/26/2016 2348   CREATININE 0.9 04/19/2015 1107      Component Value Date/Time   CALCIUM 9.1 05/26/2016 2348   CALCIUM 9.1 04/19/2015 1107   ALKPHOS 43 04/18/2016 0513   ALKPHOS 50 04/19/2015 1107   AST 23 04/18/2016 0513   AST 14 04/19/2015 1107   ALT 19 04/18/2016 0513   ALT 12 04/19/2015 1107   BILITOT 0.8 04/18/2016 0513   BILITOT <0.30 04/19/2015 1107       Lab Results  Component Value Date   WBC 9.5 07/23/2016   HGB 12.2 07/23/2016   HCT 37.2 07/23/2016   MCV 92.2 07/23/2016   PLT 565 (H) 07/23/2016   NEUTROABS 6.4 07/23/2016    ASSESSMENT & PLAN:  Essential thrombocytosis (HCC) Essential thrombocytosis with JAK2 mutation: Continue Hydrea 500 mg daily and also aspirin 325 mg by mouth once daily. Reduced the dosage of Hydrea 500 mg 3 times a week since 07/17/2014, reducing the dosage of Hydrea to 500 mg twice a week from 10/17/2014 and back up to 3 times a week starting 01/18/2015, reduced to twice a week on 04/19/2015 and toonce a week on 10/17/2015( due to decrease in hemoglobin), increase the dosage to twice a week in February 2018  Hospitalization 04/17/2016 to 04/22/2016:Possible PRES Posterior reversible encephalopathy syndrome (due to seizure) VS Complicated Migraine   Hydrea toxicities: 1.  Fatigue  2. Hair loss  3. brittle nails   Recent hospitalization for posterior reversible encephalopathy syndrome. She has tremors and is expecting to undergo another MRI coming up soon to evaluate her brain. She came in driving to her office today and feels that she has achieved a lot today.  I reviewed her counts and the platelet count is 565.  Return to clinic in 6 months for follow-up.  I spent 25 minutes talking to the patient of which more than half was spent in counseling and coordination of care.  No orders of the defined types were placed in this encounter.  The patient has a good understanding of the overall plan. she agrees with it. she will call with any problems that may develop before the next visit here.   Rulon Eisenmenger, MD 07/23/16

## 2016-07-28 ENCOUNTER — Ambulatory Visit
Admission: RE | Admit: 2016-07-28 | Discharge: 2016-07-28 | Disposition: A | Discharge status: Home / Self Care | Payer: Medicare Other | Source: Ambulatory Visit | Attending: Adult Health | Admitting: Adult Health

## 2016-07-28 DIAGNOSIS — I6783 Posterior reversible encephalopathy syndrome: Secondary | ICD-10-CM | POA: Diagnosis not present

## 2016-07-28 MED ORDER — GADOBENATE DIMEGLUMINE 529 MG/ML IV SOLN: 14.0000 mL | Freq: Once | INTRAVENOUS | Status: DC | PRN

## 2016-07-30 ENCOUNTER — Telehealth: Payer: Self-pay | Admitting: *Deleted

## 2016-07-30 NOTE — Telephone Encounter (Signed)
-----   Message from Ward Givens, NP sent at 07/29/2016  5:10 PM EDT ----- MRI showed improvement and resolution of previous left parietal and occipital T2 hyperintensities. Please call patient with results.

## 2016-07-30 NOTE — Telephone Encounter (Signed)
Spoke to pt and relayed the results of the MR Brain as per below.  (improvement and resolution of the previous L parietal and occipital hyperintensities).  She relayed that she continues with daily headaches (which are not as bad, but has the ice pick sensations around the top of her head).   Did you want to add anything to her current plan?

## 2016-08-03 NOTE — Telephone Encounter (Signed)
I called the patient. She states that she is continuing to have daily headaches. Her blood pressure has been under relatively good control. She is followed by her primary care for this. I suggested that we try Topamax 25 mg at bedtime for her headaches however the patient has a lot of concerns about potential side effects. She also states that she feels as if she may be depressed. I suggested that she come in for an office visit so we can answer all of her questions thoroughly and she can bring her daughter as well. She states that she would "like that." I will have my nurse call to get her an appointment.

## 2016-08-03 NOTE — Telephone Encounter (Signed)
Spoke to pt and made appt for 1400 on 08-05-16.  Will try to have daughter come with her.

## 2016-08-05 ENCOUNTER — Encounter: Payer: Self-pay | Admitting: Adult Health

## 2016-08-05 ENCOUNTER — Ambulatory Visit (INDEPENDENT_AMBULATORY_CARE_PROVIDER_SITE_OTHER): Payer: Medicare Other | Admitting: Adult Health

## 2016-08-05 VITALS — BP 144/70 | HR 86 | Ht 69.0 in | Wt 154.0 lb

## 2016-08-05 DIAGNOSIS — R51 Headache: Secondary | ICD-10-CM | POA: Diagnosis not present

## 2016-08-05 DIAGNOSIS — R519 Headache, unspecified: Secondary | ICD-10-CM

## 2016-08-05 DIAGNOSIS — I6783 Posterior reversible encephalopathy syndrome: Secondary | ICD-10-CM

## 2016-08-05 DIAGNOSIS — G250 Essential tremor: Secondary | ICD-10-CM

## 2016-08-05 NOTE — Patient Instructions (Signed)
Start topamax 25 mg at bedtime for 2 weeks then increase to 50 mg thereafer If your symptoms worsen or you develop new symptoms please let us know.  Topiramate tablets What is this medicine? TOPIRAMATE (toe PYRE a mate) is used to treat seizures in adults or children with epilepsy. It is also used for the prevention of migraine headaches. This medicine may be used for other purposes; ask your health care provider or pharmacist if you have questions. COMMON BRAND NAME(S): Topamax, Topiragen What should I tell my health care provider before I take this medicine? They need to know if you have any of these conditions: -bleeding disorders -cirrhosis of the liver or liver disease -diarrhea -glaucoma -kidney stones or kidney disease -low blood counts, like low white cell, platelet, or red cell counts -lung disease like asthma, obstructive pulmonary disease, emphysema -metabolic acidosis -on a ketogenic diet -schedule for surgery or a procedure -suicidal thoughts, plans, or attempt; a previous suicide attempt by you or a family member -an unusual or allergic reaction to topiramate, other medicines, foods, dyes, or preservatives -pregnant or trying to get pregnant -breast-feeding How should I use this medicine? Take this medicine by mouth with a glass of water. Follow the directions on the prescription label. Do not crush or chew. You may take this medicine with meals. Take your medicine at regular intervals. Do not take it more often than directed. Talk to your pediatrician regarding the use of this medicine in children. Special care may be needed. While this drug may be prescribed for children as young as 88 years of age for selected conditions, precautions do apply. Overdosage: If you think you have taken too much of this medicine contact a poison control center or emergency room at once. NOTE: This medicine is only for you. Do not share this medicine with others. What if I miss a dose? If you  miss a dose, take it as soon as you can. If your next dose is to be taken in less than 6 hours, then do not take the missed dose. Take the next dose at your regular time. Do not take double or extra doses. What may interact with this medicine? Do not take this medicine with any of the following medications: -probenecid This medicine may also interact with the following medications: -acetazolamide -alcohol -amitriptyline -aspirin and aspirin-like medicines -birth control pills -certain medicines for depression -certain medicines for seizures -certain medicines that treat or prevent blood clots like warfarin, enoxaparin, dalteparin, apixaban, dabigatran, and rivaroxaban -digoxin -hydrochlorothiazide -lithium -medicines for pain, sleep, or muscle relaxation -metformin -methazolamide -NSAIDS, medicines for pain and inflammation, like ibuprofen or naproxen -pioglitazone -risperidone This list may not describe all possible interactions. Give your health care provider a list of all the medicines, herbs, non-prescription drugs, or dietary supplements you use. Also tell them if you smoke, drink alcohol, or use illegal drugs. Some items may interact with your medicine. What should I watch for while using this medicine? Visit your doctor or health care professional for regular checks on your progress. Do not stop taking this medicine suddenly. This increases the risk of seizures if you are using this medicine to control epilepsy. Wear a medical identification bracelet or chain to say you have epilepsy or seizures, and carry a card that lists all your medicines. This medicine can decrease sweating and increase your body temperature. Watch for signs of deceased sweating or fever, especially in children. Avoid extreme heat, hot baths, and saunas. Be careful about exercising, especially  in hot weather. Contact your health care provider right away if you notice a fever or decrease in sweating. You should  drink plenty of fluids while taking this medicine. If you have had kidney stones in the past, this will help to reduce your chances of forming kidney stones. If you have stomach pain, with nausea or vomiting and yellowing of your eyes or skin, call your doctor immediately. You may get drowsy, dizzy, or have blurred vision. Do not drive, use machinery, or do anything that needs mental alertness until you know how this medicine affects you. To reduce dizziness, do not sit or stand up quickly, especially if you are an older patient. Alcohol can increase drowsiness and dizziness. Avoid alcoholic drinks. If you notice blurred vision, eye pain, or other eye problems, seek medical attention at once for an eye exam. The use of this medicine may increase the chance of suicidal thoughts or actions. Pay special attention to how you are responding while on this medicine. Any worsening of mood, or thoughts of suicide or dying should be reported to your health care professional right away. This medicine may increase the chance of developing metabolic acidosis. If left untreated, this can cause kidney stones, bone disease, or slowed growth in children. Symptoms include breathing fast, fatigue, loss of appetite, irregular heartbeat, or loss of consciousness. Call your doctor immediately if you experience any of these side effects. Also, tell your doctor about any surgery you plan on having while taking this medicine since this may increase your risk for metabolic acidosis. Birth control pills may not work properly while you are taking this medicine. Talk to your doctor about using an extra method of birth control. Women who become pregnant while using this medicine may enroll in the [place] Pregnancy Registry by calling [phone]. This registry collects information about the safety of antiepileptic drug use during pregnancy. What side effects may I notice from receiving this medicine? Side  effects that you should report to your doctor or health care professional as soon as possible: -allergic reactions like skin rash, itching or hives, swelling of the face, lips, or tongue -decreased sweating and/or rise in body temperature -depression -difficulty breathing, fast or irregular breathing patterns -difficulty speaking -difficulty walking or controlling muscle movements -hearing impairment -redness, blistering, peeling or loosening of the skin, including inside the mouth -tingling, pain or numbness in the hands or feet -unusual bleeding or bruising -unusually weak or tired -worsening of mood, thoughts or actions of suicide or dying Side effects that usually do not require medical attention (report to your doctor or health care professional if they continue or are bothersome): -altered taste -back pain, joint or muscle aches and pains -diarrhea, or constipation -headache -loss of appetite -nausea -stomach upset, indigestion -tremors This list may not describe all possible side effects. Call your doctor for medical advice about side effects. You may report side effects to FDA at 1-800-FDA-1088. Where should I keep my medicine? Keep out of the reach of children. Store at room temperature between 15 and 30 degrees C (59 and 86 degrees F) in a tightly closed container. Protect from moisture. Throw away any unused medicine after the expiration date. NOTE: This sheet is a summary. It may not cover all possible information. If you have questions about this medicine, talk to your doctor, pharmacist, or health care provider.  2018 [employer] Standard (2013-03-06 23:17:57)

## 2016-08-05 NOTE — Progress Notes (Signed)
I have read the note, and I agree with the clinical assessment and plan.  Cozetta Seif KEITH   

## 2016-08-05 NOTE — Progress Notes (Signed)
PATIENT: Deanna Hill DOB: [birth year]  REASON FOR VISIT: follow up tremor, pres syndrome HISTORY FROM: patient and daughter  HISTORY OF PRESENT ILLNESS: Deanna Hill is a 76 year old female with a history of essential tremor and PRES syndrome. She returns today for an evaluation. The patient continues to have daily headaches that typically occur later in the evening. She states that her headaches feel as if there is a buildup of pressure in her head. She was given Topamax at the hospital however she only took 1 dose and states that it caused diarrhea however daughter states that she does have a history of IBS and this could have been coincidental. The patient has also tried gabapentin but was unable to tolerate this medication. She's been given a prescription of zonogran but would not try this medication due to fear of potential side effects. The patient did have a repeat MRI of the brain that shows significant improvement and resolution of previous left parietal and occipital T2 hyperintensities consistent with posterior reversible encephalopathy syndrome. She returns today for an evaluation.  HISTORY 07/08/16: Deanna Hill is a 76 year old female with a history of essential tremor and PRES syndrome. She returns today for follow-up. She reports that her headaches have improved since her hospitalization however she continues to have headaches in the late afternoon. She describes the headaches as a shooting pain across the forehead. She states in the morning she always feels well but this pain always comes in the afternoon. She reports sometimes it will respond to Tylenol. She has tried gabapentin and Topamax but was unable to tolerate these medications. She has kept a blood pressure log and it appears that her blood pressure is  elevated in the evenings. She recently saw her primary care and no adjustments were made to her blood pressure medicine. She reports that her tremor is slightly worse and she  is not using medication. She tends to notice it with eating and her handwriting. Her daughter reports that she is very sensitive to medication. She denies any additional symptoms. She returns today for an evaluation.  HISTORY 05/22/16: Deanna Hill a 76 year old right-handed white female with history of an essential tremor. The patient recently was in the hospital on 04/17/2016 with what was felt to be a PRESsyndrome. The patient had been having headaches for about 3 weeks prior to the admission. She was noted to be confused by the family members, when she came to the hospital, she was noted to have a systolic blood pressure close to 250. The patient underwent MRI of the brain, MRI was consistent with PRES.the patient was admitted for further evaluation. MRA evaluation of the head and neck was relatively unremarkable, moderate stenosis of the left M2 segment was noted. An EEG study was done and showed some slowing of the left posterior aspects of the head, no epileptiform discharges were seen. A 2-D echocardiogram was performed and showed an ejection fraction of 60-65%. No significant down the valvularabnormalities were noted. The patient has been placed on medications for blood pressure, she was noted to be orthostatic in the hospital, and she remains orthostatic at this time. She is on lisinopril and Norvasc currently. She continues to have tremor, and she continues to have headaches as well. The headaches are daily in nature. The patient does have some neck stiffness and shoulder discomfort. The headaches are better in the morning, worse as the day goes on. She was placed on Topamax, but she could not tolerate the medication, a prescription  for Zonegran was called in, but she decided not take medication for fear of potential side effects. She returns to the office today for an evaluation.   REVIEW OF SYSTEMS: Out of a complete 14 system review of symptoms, the patient complains only of the following  symptoms, and all other reviewed systems are negative.  See history of present illness  ALLERGIES: Allergies  Allergen Reactions  . Iodine Other (See Comments)    Pt states "Deadly"  . Prednisone     Facial flushing and swelling  . Shellfish Allergy Other (See Comments)    " I almost died once"     HOME MEDICATIONS: Outpatient Medications Prior to Visit  Medication Sig Dispense Refill  . acetaminophen (TYLENOL) 325 MG tablet Take 650 mg by mouth daily as needed for mild pain.    [name] ALPRAZolam (XANAX) 0.25 MG tablet Take 0.5 tablets (0.125 mg total) by mouth 3 (three) times daily as needed for anxiety. 30 tablet 2  . amLODipine (NORVASC) 5 MG tablet Take 1 tablet (5 mg total) by mouth daily. 30 tablet 1  . aspirin 325 MG tablet Take 325 mg by mouth daily.     [name] BIOTIN PO Take 1 capsule by mouth daily.    . Cholecalciferol (VITAMIN D) 2000 UNITS CAPS Take 1 capsule by mouth daily.    . Evening Primrose Oil 500 MG CAPS Take 1 capsule by mouth 2 (two) times daily.    . hydroxyurea (HYDREA) 500 MG capsule Take 1 capsule (500 mg total) by mouth every Monday, Wednesday, and Friday. 36 capsule 3  . levothyroxine (SYNTHROID, LEVOTHROID) 88 MCG tablet Take 100 mcg by mouth daily.     . metroNIDAZOLE (METROCREAM) 0.75 % cream Apply 1 application topically daily as needed (for rosacea).    . nitroGLYCERIN (NITROSTAT) 0.4 MG SL tablet Place 0.4 mg under the tongue every 5 (five) minutes as needed for chest pain.    . ranitidine (ZANTAC) 150 MG capsule Take 150 mg by mouth 2 (two) times daily.      No facility-administered medications prior to visit.     PAST MEDICAL HISTORY: Past Medical History:  Diagnosis Date  . Anxiety   . Essential tremor 06/19/2015  . GERD (gastroesophageal reflux disease)   . Hyperlipidemia   . Hypertension   . Hypothyroidism   . IBS (irritable bowel syndrome)   . Orthostatic hypotension 05/22/2016  . Thrombocytosis ([place]) 03/11/2011    PAST SURGICAL  HISTORY: Past Surgical History:  Procedure Laterality Date  . ABDOMINAL HYSTERECTOMY    . CATARACT EXTRACTION    . CHOLECYSTECTOMY, LAPAROSCOPIC    . LEFT HEART CATHETERIZATION WITH CORONARY ANGIOGRAM N/A 06/08/2013   Procedure: LEFT HEART CATHETERIZATION WITH CORONARY ANGIOGRAM;  Surgeon: Jacolyn Reedy, MD;  Location: [facility] CATH LAB;  Service: Cardiovascular;  Laterality: N/A;  . TOE SURGERY    . TONSILLECTOMY      FAMILY HISTORY: Family History  Problem Relation Age of Onset  . Skin cancer Mother     SOCIAL HISTORY: Social History   Social History  . Marital status: Widowed    Spouse name: N/A  . Number of children: N/A  . Years of education: N/A   Occupational History  . Not on file.   Social History Main Topics  . Smoking status: Never Smoker  . Smokeless tobacco: Never Used  . Alcohol use No  . Drug use: No  . Sexual activity: Not Currently   Other Topics Concern  . Not  on file   Social History Narrative   Lives home alone.  Retired.  12 th grade education.           PHYSICAL EXAM  Vitals:   08/05/16 1347  BP: (!) 144/70  Pulse: 86  Weight: 154 lb (69.9 kg)  Height: 5\' 9"  (1.753 m)   Body mass index is 22.74 kg/m.  Generalized: Well developed, in no acute distress   Neurological examination  Mentation: Alert oriented to time, place, history taking. Follows all commands speech and language fluent Cranial nerve II-XII: Pupils were equal round reactive to light. Extraocular movements were full, visual field were full on confrontational test. Facial sensation and strength were normal. Uvula tongue midline. Head turning and shoulder shrug  were normal and symmetric. Motor: The motor testing reveals 5 over 5 strength of all 4 extremities. Good symmetric motor tone is noted throughout.  Sensory: Sensory testing is intact to soft touch on all 4 extremities. No evidence of extinction is noted.  Coordination: Cerebellar testing reveals good  finger-nose-finger and heel-to-shin bilaterally.  Gait and station: Gait is normal. \ Reflexes: Deep tendon reflexes are symmetric and normal bilaterally.   DIAGNOSTIC DATA (LABS, IMAGING, TESTING) - I reviewed patient records, labs, notes, testing and imaging myself where available.  Lab Results  Component Value Date   WBC 9.5 07/23/2016   HGB 12.2 07/23/2016   HCT 37.2 07/23/2016   MCV 92.2 07/23/2016   PLT 565 (H) 07/23/2016      Component Value Date/Time   NA 136 05/26/2016 2348   NA 140 04/19/2015 1107   K 4.1 05/26/2016 2348   K 4.2 04/19/2015 1107   CL 103 05/26/2016 2348   CL 107 06/24/2012 0859   CO2 24 05/26/2016 2348   CO2 25 04/19/2015 1107   GLUCOSE 124 (H) 05/26/2016 2348   GLUCOSE 94 04/19/2015 1107   GLUCOSE 81 06/24/2012 0859   BUN 17 05/26/2016 2348   BUN 21.1 04/19/2015 1107   CREATININE 1.08 (H) 05/26/2016 2348   CREATININE 0.9 04/19/2015 1107   CALCIUM 9.1 05/26/2016 2348   CALCIUM 9.1 04/19/2015 1107   PROT 6.4 (L) 04/18/2016 0513   PROT 6.9 04/19/2015 1107   ALBUMIN 4.0 04/18/2016 0513   ALBUMIN 3.9 04/19/2015 1107   AST 23 04/18/2016 0513   AST 14 04/19/2015 1107   ALT 19 04/18/2016 0513   ALT 12 04/19/2015 1107   ALKPHOS 43 04/18/2016 0513   ALKPHOS 50 04/19/2015 1107   BILITOT 0.8 04/18/2016 0513   BILITOT <0.30 04/19/2015 1107   GFRNONAA 49 (L) 05/26/2016 2348   GFRAA 56 (L) 05/26/2016 2348   Lab Results  Component Value Date   CHOL 162 04/18/2016   HDL 44 04/18/2016   LDLCALC 106 (H) 04/18/2016   TRIG 58 04/18/2016   CHOLHDL 3.7 04/18/2016   Lab Results  Component Value Date   HGBA1C 5.7 (H) 04/18/2016   Lab Results  Component Value Date   VITAMINB12 964 04/16/2016   Lab Results  Component Value Date   TSH 1.259 04/19/2016      ASSESSMENT AND PLAN 76 y.o. year old female  has a past medical history of Anxiety; Essential tremor (06/19/2015); GERD (gastroesophageal reflux disease); Hyperlipidemia; Hypertension;  Hypothyroidism; IBS (irritable bowel syndrome); Orthostatic hypotension (05/22/2016); and Thrombocytosis ([place]) (03/11/2011). here with:  1. Daily headache 2. PRES syndrome 3. Essential tremor  I discussed with patient and her daughter about starting a prophylactic medication for her headaches. The patient has a  history of migraine headaches. She would like to retry Topamax. She'll begin taking 25 mg at bedtime. She will try this for 2 weeks and if tolerating well she will increase to 50 mg at bedtime. She is advised that if her symptoms worsen or she develops new symptoms she should let us know. She will follow-up in 6 months with Dr. Jannifer Franklin.    Ward Givens, MSN, NP-C 08/05/2016, 2:15 PM [facility] [address], [place], [place] [address] [phone]

## 2016-11-18 ENCOUNTER — Ambulatory Visit (INDEPENDENT_AMBULATORY_CARE_PROVIDER_SITE_OTHER): Payer: Medicare Other | Admitting: Neurology

## 2016-11-18 ENCOUNTER — Encounter: Payer: Self-pay | Admitting: Neurology

## 2016-11-18 VITALS — BP 144/76 | HR 92 | Ht 69.0 in | Wt 154.0 lb

## 2016-11-18 DIAGNOSIS — I6783 Posterior reversible encephalopathy syndrome: Secondary | ICD-10-CM | POA: Diagnosis not present

## 2016-11-18 DIAGNOSIS — G250 Essential tremor: Secondary | ICD-10-CM

## 2016-11-18 MED ORDER — TOPIRAMATE 25 MG PO CPSP: 25.0000 mg | ORAL_CAPSULE | Freq: Every day | ORAL | 2 refills | Status: DC

## 2016-11-18 NOTE — Progress Notes (Signed)
Called and spoke with Robin at [facility]. Called in VO for tablets instead of capsule. Qty 90, 2 refills. Ok per CW,MD.

## 2016-11-18 NOTE — Progress Notes (Addendum)
Reason for visit: Tremor  Deanna Hill is an 76 y.o. female  History of present illness:  Deanna Hill is a 76 year old right-handed white female with a history of an essential tremor. The patient has had an event of PRES associated with hypertension in February 2018. Repeat MRI of the brain done in May 2018 showed resolution of the posterior abnormalities of the brain associated with PRES. The patient has been placed on blood pressure medications, she has had some problems with orthostatic hypotension. She feels somewhat woozy when she stands up, she uses a cane for ambulation, she has not had any blackout episodes or falls. She will have good days and bad days with the tremor, she has alprazolam to take if needed for this. She has had problems with daily headaches but this has improved on the Topamax taking 25 mg at night. The patient is having a brief headache in the evening once or twice a week at this point. She returns for an evaluation.  Past Medical History:  Diagnosis Date  . Anxiety   . Essential tremor 06/19/2015  . GERD (gastroesophageal reflux disease)   . Hyperlipidemia   . Hypertension   . Hypothyroidism   . IBS (irritable bowel syndrome)   . Orthostatic hypotension 05/22/2016  . Thrombocytosis ([place]) 03/11/2011    Past Surgical History:  Procedure Laterality Date  . ABDOMINAL HYSTERECTOMY    . CATARACT EXTRACTION    . CHOLECYSTECTOMY, LAPAROSCOPIC    . LEFT HEART CATHETERIZATION WITH CORONARY ANGIOGRAM N/A 06/08/2013   Procedure: LEFT HEART CATHETERIZATION WITH CORONARY ANGIOGRAM;  Surgeon: Jacolyn Reedy, MD;  Location: [facility] CATH LAB;  Service: Cardiovascular;  Laterality: N/A;  . TOE SURGERY    . TONSILLECTOMY      Family History  Problem Relation Age of Onset  . Skin cancer Mother     Social history:  reports that she has never smoked. She has never used smokeless tobacco. She reports that she does not drink alcohol or use drugs.    Allergies  Allergen  Reactions  . Iodine Other (See Comments)    Pt states "Deadly"  . Prednisone     Facial flushing and swelling  . Shellfish Allergy Other (See Comments)    " I almost died once"     Medications:  Prior to Admission medications   Medication Sig Start Date End Date Taking? Authorizing Provider  acetaminophen (TYLENOL) 325 MG tablet Take 650 mg by mouth daily as needed for mild pain.   Yes [provider]  ALPRAZolam (XANAX) 0.25 MG tablet Take 0.5 tablets (0.125 mg total) by mouth 3 (three) times daily as needed for anxiety. 06/19/15  Yes Kathrynn Ducking, MD  amLODipine (NORVASC) 5 MG tablet Take 1 tablet (5 mg total) by mouth daily. Patient taking differently: Take 2.5 mg by mouth daily.  04/22/16  Yes Reyne Dumas, MD  aspirin 325 MG tablet Take 325 mg by mouth daily.    Yes [provider]  BIOTIN PO Take 1 capsule by mouth daily.   Yes [provider]  Cholecalciferol (VITAMIN D) 2000 UNITS CAPS Take 1 capsule by mouth daily.   Yes [provider]  Evening Primrose Oil 500 MG CAPS Take 1 capsule by mouth 2 (two) times daily.   Yes [provider]  hydroxyurea (HYDREA) 500 MG capsule Take 1 capsule (500 mg total) by mouth every Monday, Wednesday, and Friday. 07/24/16  Yes Nicholas Lose, MD  levothyroxine ([place], [place]) 88  MCG tablet Take 100 mcg by mouth daily.    Yes [provider]  metroNIDAZOLE (METROCREAM) 0.75 % cream Apply 1 application topically daily as needed (for rosacea).   Yes [provider]  nitroGLYCERIN (NITROSTAT) 0.4 MG SL tablet Place 0.4 mg under the tongue every 5 (five) minutes as needed for chest pain.   Yes [provider]  ranitidine (ZANTAC) 150 MG capsule Take 150 mg by mouth 2 (two) times daily.    Yes [provider]  topiramate (TOPAMAX) 25 MG capsule Take 25 mg by mouth daily.   Yes [provider]    ROS:  Out of a complete 14 system review of symptoms, the  patient complains only of the following symptoms, and all other reviewed systems are negative.  Fatigue Eye discharge Heart murmur Nausea Back pain, neck pain, neck stiffness Headache, tremors Nervousness  Blood pressure (!) 144/76, pulse 92, height 5\' 9"  (1.753 m), weight 154 lb (69.9 kg).   Blood pressure, right arm, sitting is 128/68. Blood pressure, right arm, standing is 94/50.  Physical Exam  General: The patient is alert and cooperative at the time of the examination.  Skin: No significant peripheral edema is noted.   Neurologic Exam  Mental status: The patient is alert and oriented x 3 at the time of the examination. The patient has apparent normal recent and remote memory, with an apparently normal attention span and concentration ability.   Cranial nerves: Facial symmetry is present. Speech is normal, no aphasia or dysarthria is noted. Extraocular movements are full. Visual fields are full.  Motor: The patient has good strength in all 4 extremities.  Sensory examination: Soft touch sensation is symmetric on the face, arms, and legs.  Coordination: The patient has good finger-nose-finger and heel-to-shin bilaterally. The patient does have tremors with finger-nose-finger bilaterally.  Gait and station: The patient has a slightly wide-based gait, patient uses a cane for ambulation. Tandem gait was not attempted. Romberg is negative. No drift is seen.  Reflexes: Deep tendon reflexes are symmetric.   MRI brain 07/29/16:  IMPRESSION:  Mildly abnormal MRI brain (with and without) demonstrating: 1. Mild periventricular and subcortical foci of chronic small vessel ischemic disease. 2. No acute findings. 3. Compared to MRI on 04/18/16, there has been significant improvement and resolution of previous left parietal and occipital T2 hyperintensities consistent with posterior reversible encephalopathy syndrome.  * MRI scan images were reviewed online. I agree with the written  report.    Assessment/Plan:  1. Essential tremor  2. History of PRES  3. Orthostatic hypotension  4. Headache syndrome  The patient continues to have some tremor, this is exacerbated by her underlying anxiety disorder which also worsens her irritable bowel syndrome issues. The patient has gained benefit from Topamax with her headache, she is on a low dose of 25 mg at night. A prescription was sent in for this. The patient will need to be careful about standing too quickly given her underlying orthostasis. She will follow-up through this office in about 6 months.   Jill Alexanders MD 11/18/2016 2:08 PM  [facility] [address] [place], [place] [address]  Phone [phone] Fax [phone]

## 2016-12-15 ENCOUNTER — Telehealth: Payer: Self-pay | Admitting: Neurology

## 2016-12-15 NOTE — Telephone Encounter (Signed)
I called patient. She is still on Topamax 25 mg at night. I got a letter from Dr. Rex Kras who was concerned that the Topamax could result in orthostatic hypotension.  The patient is doing well with her headaches, we will go ahead and stop the Topamax now, the patient will call me if the headaches get worse.  She checks her blood pressures 3 times a day.

## 2016-12-25 ENCOUNTER — Telehealth: Payer: Self-pay | Admitting: *Deleted

## 2016-12-25 NOTE — Telephone Encounter (Signed)
Referral Notes from [facility] @ [facility] sent to Scheduling

## 2017-01-04 ENCOUNTER — Telehealth: Payer: Self-pay | Admitting: Cardiology

## 2017-01-04 NOTE — Telephone Encounter (Signed)
Received records from [facility] for appointment on 01/27/17 with Dr Ellyn Hack.  Records put with Dr Allison Quarry schedule for 01/27/17. lp

## 2017-01-14 HISTORY — PX: OTHER SURGICAL HISTORY: SHX169

## 2017-01-18 ENCOUNTER — Other Ambulatory Visit: Payer: Self-pay

## 2017-01-18 ENCOUNTER — Other Ambulatory Visit (HOSPITAL_BASED_OUTPATIENT_CLINIC_OR_DEPARTMENT_OTHER): Payer: Medicare Other

## 2017-01-18 ENCOUNTER — Telehealth: Payer: Self-pay | Admitting: Hematology and Oncology

## 2017-01-18 ENCOUNTER — Ambulatory Visit (HOSPITAL_BASED_OUTPATIENT_CLINIC_OR_DEPARTMENT_OTHER): Payer: Medicare Other | Admitting: Hematology and Oncology

## 2017-01-18 DIAGNOSIS — D473 Essential (hemorrhagic) thrombocythemia: Secondary | ICD-10-CM | POA: Diagnosis not present

## 2017-01-18 DIAGNOSIS — L658 Other specified nonscarring hair loss: Secondary | ICD-10-CM | POA: Diagnosis not present

## 2017-01-18 DIAGNOSIS — D6481 Anemia due to antineoplastic chemotherapy: Secondary | ICD-10-CM

## 2017-01-18 DIAGNOSIS — I951 Orthostatic hypotension: Secondary | ICD-10-CM | POA: Diagnosis not present

## 2017-01-18 DIAGNOSIS — T451X5A Adverse effect of antineoplastic and immunosuppressive drugs, initial encounter: Secondary | ICD-10-CM

## 2017-01-18 DIAGNOSIS — R5383 Other fatigue: Secondary | ICD-10-CM | POA: Diagnosis not present

## 2017-01-18 DIAGNOSIS — L603 Nail dystrophy: Secondary | ICD-10-CM | POA: Diagnosis not present

## 2017-01-18 LAB — CBC WITH DIFFERENTIAL/PLATELET
BASO%: 1.3 % (ref 0.0–2.0)
BASOS ABS: 0.1 10*3/uL (ref 0.0–0.1)
EOS%: 16.6 % — AB (ref 0.0–7.0)
Eosinophils Absolute: 1.4 10*3/uL — ABNORMAL HIGH (ref 0.0–0.5)
HEMATOCRIT: 39.1 % (ref 34.8–46.6)
HGB: 12.1 g/dL (ref 11.6–15.9)
LYMPH#: 1 10*3/uL (ref 0.9–3.3)
LYMPH%: 12 % — ABNORMAL LOW (ref 14.0–49.7)
MCH: 30.1 pg (ref 25.1–34.0)
MCHC: 30.9 g/dL — AB (ref 31.5–36.0)
MCV: 97.3 fL (ref 79.5–101.0)
MONO#: 0.5 10*3/uL (ref 0.1–0.9)
MONO%: 5.5 % (ref 0.0–14.0)
NEUT#: 5.5 10*3/uL (ref 1.5–6.5)
NEUT%: 64.6 % (ref 38.4–76.8)
Platelets: 464 10*3/uL — ABNORMAL HIGH (ref 145–400)
RBC: 4.02 10*6/uL (ref 3.70–5.45)
RDW: 14.4 % (ref 11.2–14.5)
WBC: 8.6 10*3/uL (ref 3.9–10.3)

## 2017-01-18 LAB — COMPREHENSIVE METABOLIC PANEL
ALK PHOS: 54 U/L (ref 40–150)
ALT: 11 U/L (ref 0–55)
ANION GAP: 8 meq/L (ref 3–11)
AST: 14 U/L (ref 5–34)
Albumin: 4.1 g/dL (ref 3.5–5.0)
BUN: 21.7 mg/dL (ref 7.0–26.0)
CALCIUM: 9.3 mg/dL (ref 8.4–10.4)
CHLORIDE: 107 meq/L (ref 98–109)
CO2: 25 mEq/L (ref 22–29)
Creatinine: 1 mg/dL (ref 0.6–1.1)
EGFR: 56 mL/min/{1.73_m2} — AB (ref >60)
Glucose: 109 mg/dl (ref 70–140)
POTASSIUM: 4.1 meq/L (ref 3.5–5.1)
Sodium: 140 mEq/L (ref 136–145)
Total Bilirubin: 0.31 mg/dL (ref 0.20–1.20)
Total Protein: 7.1 g/dL (ref 6.4–8.3)

## 2017-01-18 MED ORDER — AMLODIPINE BESYLATE 5 MG PO TABS: 2.5000 mg | ORAL_TABLET | Freq: Every day | ORAL | Status: DC

## 2017-01-18 NOTE — Progress Notes (Signed)
Patient Care Team: Hulan Fess, MD as PCP - General  DIAGNOSIS:  Encounter Diagnosis  Name Primary?  . Essential thrombocytosis (HCC)     CHIEF COMPLIANT: Follow-up of essential thrombocytosis  INTERVAL HISTORY: Deanna Hill is a 76 year old with above-mentioned history of essential thrombocytosis is currently on hydroxyurea. She takes hydroxyurea 3 times a week.  She tells me that her major problems are related to blood pressure.  She has orthostatic hypotension and is going to see a cardiologist.  REVIEW OF SYSTEMS:   Constitutional: Denies fevers, chills or abnormal weight loss Eyes: Denies blurriness of vision Ears, nose, mouth, throat, and face: Denies mucositis or sore throat Respiratory: Denies cough, dyspnea or wheezes Cardiovascular: Denies palpitation, chest discomfort Gastrointestinal:  Denies nausea, heartburn or change in bowel habits Skin: Denies abnormal skin rashes Lymphatics: Denies new lymphadenopathy or easy bruising Neurological:Denies numbness, tingling or new weaknesses Behavioral/Psych: Anxiety and stress related to her blood pressure.  She has 2 nieces were going through breast cancer treatments and a friend who has leukemia. Extremities: No lower extremity edema All other systems were reviewed with the patient and are negative.  I have reviewed the past medical history, past surgical history, social history and family history with the patient and they are unchanged from previous note.  ALLERGIES:  is allergic to iodine; prednisone; and shellfish allergy.  MEDICATIONS:  Current Outpatient Medications  Medication Sig Dispense Refill  . acetaminophen (TYLENOL) 325 MG tablet Take 650 mg by mouth daily as needed for mild pain.    [name] ALPRAZolam (XANAX) 0.25 MG tablet Take 0.5 tablets (0.125 mg total) by mouth 3 (three) times daily as needed for anxiety. 30 tablet 2  . amLODipine (NORVASC) 5 MG tablet Take 1 tablet (5 mg total) by mouth daily. (Patient  taking differently: Take 2.5 mg by mouth daily. ) 30 tablet 1  . aspirin 325 MG tablet Take 325 mg by mouth daily.     [name] BIOTIN PO Take 1 capsule by mouth daily.    . Cholecalciferol (VITAMIN D) 2000 UNITS CAPS Take 1 capsule by mouth daily.    . Evening Primrose Oil 500 MG CAPS Take 1 capsule by mouth 2 (two) times daily.    . hydroxyurea (HYDREA) 500 MG capsule Take 1 capsule (500 mg total) by mouth every Monday, Wednesday, and Friday. 36 capsule 3  . levothyroxine (SYNTHROID, LEVOTHROID) 88 MCG tablet Take 100 mcg by mouth daily.     . metroNIDAZOLE (METROCREAM) 0.75 % cream Apply 1 application topically daily as needed (for rosacea).    . nitroGLYCERIN (NITROSTAT) 0.4 MG SL tablet Place 0.4 mg under the tongue every 5 (five) minutes as needed for chest pain.    . ranitidine (ZANTAC) 150 MG capsule Take 150 mg by mouth 2 (two) times daily.     [name] topiramate (TOPAMAX) 25 MG tablet Take 25 mg by mouth daily.     No current facility-administered medications for this visit.     PHYSICAL EXAMINATION: ECOG PERFORMANCE STATUS: 1 - Symptomatic but completely ambulatory  There were no vitals filed for this visit. There were no vitals filed for this visit.  GENERAL:alert, no distress and comfortable SKIN: skin color, texture, turgor are normal, no rashes or significant lesions EYES: normal, Conjunctiva are pink and non-injected, sclera clear OROPHARYNX:no exudate, no erythema and lips, buccal mucosa, and tongue normal  NECK: supple, thyroid normal size, non-tender, without nodularity LYMPH:  no palpable lymphadenopathy in the cervical, axillary or inguinal LUNGS: clear  to auscultation and percussion with normal breathing effort HEART: regular rate & rhythm and no murmurs and no lower extremity edema ABDOMEN:abdomen soft, non-tender and normal bowel sounds MUSCULOSKELETAL:no cyanosis of digits and no clubbing  NEURO: alert & oriented x 3 with fluent speech, no focal motor/sensory  deficits EXTREMITIES: No lower extremity edema  LABORATORY DATA:  I have reviewed the data as listed   Chemistry      Component Value Date/Time   NA 136 05/26/2016 2348   NA 140 04/19/2015 1107   K 4.1 05/26/2016 2348   K 4.2 04/19/2015 1107   CL 103 05/26/2016 2348   CL 107 06/24/2012 0859   CO2 24 05/26/2016 2348   CO2 25 04/19/2015 1107   BUN 17 05/26/2016 2348   BUN 21.1 04/19/2015 1107   CREATININE 1.08 (H) 05/26/2016 2348   CREATININE 0.9 04/19/2015 1107      Component Value Date/Time   CALCIUM 9.1 05/26/2016 2348   CALCIUM 9.1 04/19/2015 1107   ALKPHOS 43 04/18/2016 0513   ALKPHOS 50 04/19/2015 1107   AST 23 04/18/2016 0513   AST 14 04/19/2015 1107   ALT 19 04/18/2016 0513   ALT 12 04/19/2015 1107   BILITOT 0.8 04/18/2016 0513   BILITOT <0.30 04/19/2015 1107       Lab Results  Component Value Date   WBC 8.6 01/18/2017   HGB 12.1 01/18/2017   HCT 39.1 01/18/2017   MCV 97.3 01/18/2017   PLT 464 (H) 01/18/2017   NEUTROABS 5.5 01/18/2017    ASSESSMENT & PLAN:  Essential thrombocytosis (HCC) Essential thrombocytosis with JAK2 mutation: Continue Hydrea 500 mg daily and also aspirin 325 mg by mouth once daily. Reduced the dosage of Hydrea 500 mg 3 times a week since 07/17/2014, reducing the dosage of Hydrea to 500 mg twice a week from 10/17/2014 and back up to 3 times a week starting 01/18/2015, reduced to twice a week on 04/19/2015 and toonce a week on 10/17/2015( due to decrease in hemoglobin), increase the dosage to twice a week in February 2018  Hospitalization 04/17/2016 to 04/22/2016:Possible PRES Posterior reversible encephalopathy syndrome (due to seizure) VS Complicated Migraine   Hydrea toxicities: 1. Fatigue  2. Hair loss  3. brittle nails   Hospitalization for posterior reversible encephalopathy syndrome  I reviewed her counts and the platelet count is .  Return to clinic in 6 months for follow-up.     I spent 25 minutes talking to  the patient of which more than half was spent in counseling and coordination of care.  No orders of the defined types were placed in this encounter.  The patient has a good understanding of the overall plan. she agrees with it. she will call with any problems that may develop before the next visit here.   Rulon Eisenmenger, MD 01/18/17

## 2017-01-18 NOTE — Assessment & Plan Note (Signed)
Essential thrombocytosis with JAK2 mutation: Continue Hydrea 500 mg daily and also aspirin 325 mg by mouth once daily. Reduced the dosage of Hydrea 500 mg 3 times a week since 07/17/2014, reducing the dosage of Hydrea to 500 mg twice a week from 10/17/2014 and back up to 3 times a week starting 01/18/2015, reduced to twice a week on 04/19/2015 and toonce a week on 10/17/2015( due to decrease in hemoglobin), increase the dosage to twice a week in February 2018  Hospitalization 04/17/2016 to 04/22/2016:Possible PRES Posterior reversible encephalopathy syndrome (due to seizure) VS Complicated Migraine   Hydrea toxicities: 1. Fatigue  2. Hair loss  3. brittle nails   Hospitalization for posterior reversible encephalopathy syndrome  I reviewed her counts and the platelet count is .  Return to clinic in 6 months for follow-up.

## 2017-01-18 NOTE — Telephone Encounter (Signed)
Gave patient avs report and appointments for May  °

## 2017-01-27 ENCOUNTER — Encounter: Payer: Self-pay | Admitting: Cardiology

## 2017-01-27 ENCOUNTER — Ambulatory Visit: Payer: Medicare Other | Admitting: Cardiology

## 2017-01-27 VITALS — Ht 68.0 in | Wt 154.0 lb

## 2017-01-27 DIAGNOSIS — I10 Essential (primary) hypertension: Secondary | ICD-10-CM | POA: Diagnosis not present

## 2017-01-27 DIAGNOSIS — I6783 Posterior reversible encephalopathy syndrome: Secondary | ICD-10-CM

## 2017-01-27 DIAGNOSIS — I209 Angina pectoris, unspecified: Secondary | ICD-10-CM

## 2017-01-27 DIAGNOSIS — R011 Cardiac murmur, unspecified: Secondary | ICD-10-CM

## 2017-01-27 DIAGNOSIS — R9439 Abnormal result of other cardiovascular function study: Secondary | ICD-10-CM

## 2017-01-27 DIAGNOSIS — G250 Essential tremor: Secondary | ICD-10-CM

## 2017-01-27 DIAGNOSIS — I951 Orthostatic hypotension: Secondary | ICD-10-CM | POA: Diagnosis not present

## 2017-01-27 MED ORDER — HYDRALAZINE HCL 25 MG PO TABS: 25.0000 mg | ORAL_TABLET | Freq: Every day | ORAL | 6 refills | Status: DC | PRN

## 2017-01-27 NOTE — Progress Notes (Addendum)
PCP: Hulan Fess, MD [place] [facility] Neurology: Dr. Jannifer Franklin Hematology-Oncology Dr. Lindi Adie She has been seen by both Dr. Wynonia Lawman and Dr. Einar Gip for cardiology --now referred for her third cardiologist he had an echo 10  Clinic Note: Chief Complaint  Patient presents with  . New Patient (Initial Visit)    Orthostatic hypotension, history of accelerated hypertension with PRES    HPI: Deanna Hill is a 76 y.o. female who is being seen today for the evaluation of orthostatic hypotension in setting of hypertension at the request of Hulan Fess, MD.  -->  Most likely a second opinion. She has a history of hypertension, hypothyroidism, thrombocytosis as well as essential tremor and mixed dyslipidemia.  In February 2018, she actually was admitted for Posterior Reversible Encephalopathic Syndrome (PRES) with significant hypertension (hypertensive crisis) and blood pressure in the 200 range.  She had severe headaches at that time.  She was also started on Topamax at that time for headaches   Originally seen by Dr. Einar Gip who acknowledged orthostatic hypotension and started her on Florinef and instructed her to wear compression stockings.  The patient had not started the Florinef.  She tells me she has not started Midrin and that it was also we was previously written.  Deanna Hill was referred by Dr. Rex Kras after being seen on October 11.  He is noticing significant or static hypotension as well as difficult to control hypertension.  She also was noted to have a murmur and history for she is referred for evaluation.  Initial referral was placed to [facility], however she is now here. --Blood pressure logs indicate her pressures are elevated in the evenings.  Recent Hospitalizations:   Complicated admission February (2-7) 2018 determination was that the final diagnosis was PRES as a complication of accelerated hypertension.  Several follow-up visits with neurology. --     May 27, 2016 ER visit for hypertension   Initial Cardiology consultation with Dr. Einar Gip (seen by his NP -Donette Larry, FNP-C) September 03, 2016: -Consult was for murmur.  Because of murmur was not indicated however it is clearly aortic sclerosis based on echocardiogram.  The orthostatic hypotension and previous PRES was discussed.  Recommended taking amlodipine in the evening.  Also advised sleeping on a wedge pillow to avoid supine hypertension.  Recommended thigh-high compression stockings (patient did not use) was started on low-dose Midodrine 5 mg as needed (patient did not use)  On follow-up visit, patient did not want to either wear support stockings or take midodrine. .  Noted that she was very emotional about the loss of her husband.  Noted that she walk with a cane. -->  Last seen on November 11, 2016:  Patient was very reluctant again to use midodrine despite lengthy discussion for fall risk.  Again recommended thigh-high support stockings.  Recommendation was PRN follow-up  Studies Personally Reviewed - (if available, images/films reviewed: From Epic Chart or Care Everywhere)  Transthoracic Echo February 2018: Normal EF of 60-65%.  Normal wall motion.  GR 2 DD.  Aortic sclerosis without stenosis  Cardiac catheterization March 2015 (Dr. Wynonia Lawman): Mild coronary artery disease with 30% proximal circumflex lesion.  Otherwise nonobstructive.  Normal left ventricle function.  Interval History: Deanna Hill presents today with an extensive packet that I received after seeing the patient. She has been seen several times by Dr. Einar Gip.  I just received those notes as well.  Prior to that she is being seen by Dr. Wynonia Lawman.  She actually  had a heart catheterization in 2015 after nuclear stress test that was read as abnormal.  Deanna Hill still has a wide range of blood pressures and today on orthostatic check was extremely orthostatic with initial supine blood pressures of 186/87 mmHg indicating supine  hypertension with a drop upon sitting to 143/78 mmHg --> initial standing pressures dropped all the way to 101/58 mmHg.  There is only a modest heart rate increased to 94 bpm from 84.  After 3 minutes her blood pressure artery rate normalized to 117/90 mmHg. she does note profound positional dizziness, but has had blood pressures as high as 230s over 110s with profound headaches.  Looking at her blood pressure log most the time the blood pressures are low to moderate in the morning and go up in the evenings.  She is currently only taking low-dose amlodipine.  No longer on lisinopril.  She has ProAmatine listed as a medication on her PCPs note, but is not currently listed here.  She has a follow-up but tells me that she is not taking it.  She denies any headaches or chest tightness or pressure currently, but if her blood pressure goes up she does get headaches and blurred vision.  She has not had any more episodes like her PRES episode, but lives in constant fear. But she does have significant dizziness with standing, but has not had any pass out spells.  No falls.  She does use a walker now.  Other cardiac symptoms are relatively benign with no PND, orthopnea and only minimal edema. No palpitations or irregular heartbeats.  No recurrent TIA or amaurosis fugax symptoms.  No melena, hematochezia, hematuria, or epstaxis. No claudication.  ROS: A comprehensive was performed. Review of Systems  Constitutional: Positive for malaise/fatigue (Not a new finding).  HENT: Negative for nosebleeds.   Eyes: Positive for blurred vision (With elevated blood pressure).  Respiratory: Positive for shortness of breath (Only when blood pressure is very high).   Musculoskeletal: Negative for joint pain.  Neurological: Positive for tremors (Baseline essential tremor mostly on the left side) and headaches (With elevated blood pressure).  All other systems reviewed and are negative.  I have reviewed and (if needed)  personally updated the patient's problem list, medications, allergies, past medical and surgical history, social and family history.   Past Medical History:  Diagnosis Date  . Anxiety   . Coronary artery disease, non-occlusive 05/2013   Cath for Abn Myoview => mild CAD, 30% pCx. Otw normal Coronaries.  Normlal EF.  . Essential tremor 06/19/2015  . GERD (gastroesophageal reflux disease)   . Hyperlipidemia   . Hypertension   . Hypothyroidism   . IBS (irritable bowel syndrome)   . Orthostatic hypotension 05/22/2016  . Senile calcific aortic valve sclerosis 04/2016   2D echo showed aortic sclerosis but no stenosis.  Normal wall motion.  GR 2 DD  . Thrombocytosis ([place]) 03/11/2011    Past Surgical History:  Procedure Laterality Date  . ABDOMINAL HYSTERECTOMY  1979  . CARDIAC CATHETERIZATION  05/2013   Dr. Wynonia Lawman: Mild CAD, 30%pCx.  Otherwise nonobstructive disease.  Normal LV function.  [name] CATARACT EXTRACTION Bilateral 2015   Dr. Herbert Deaner  . CHOLECYSTECTOMY, LAPAROSCOPIC  2001  . LEFT HEART CATHETERIZATION WITH CORONARY ANGIOGRAM N/A 06/08/2013   Performed by Jacolyn Reedy, MD at [facility] CATH LAB  . TOE SURGERY Left 1994  . TONSILLECTOMY  1952    Current Meds  Medication Sig  . acetaminophen (TYLENOL) 325 MG tablet Take 650  mg by mouth daily as needed for mild pain.  [name] ALPRAZolam (XANAX) 0.25 MG tablet Take 0.5 tablets (0.125 mg total) by mouth 3 (three) times daily as needed for anxiety.  [name] amLODipine (NORVASC) 5 MG tablet Take 0.5 tablets (2.5 mg total) daily by mouth.  [name] aspirin 325 MG tablet Take 325 mg by mouth daily.   [name] BIOTIN PO Take 1 capsule by mouth daily.  . Cholecalciferol (VITAMIN D) 2000 UNITS CAPS Take 1 capsule by mouth daily.  . Evening Primrose Oil 500 MG CAPS Take 1 capsule by mouth 2 (two) times daily.  . hydroxyurea (HYDREA) 500 MG capsule Take 1 capsule (500 mg total) by mouth every Monday, Wednesday, and Friday.  . levothyroxine (SYNTHROID, LEVOTHROID) 88 MCG  tablet Take 88 mcg daily by mouth.   . metroNIDAZOLE (METROCREAM) 0.75 % cream Apply 1 application topically daily as needed (for rosacea).  . nitroGLYCERIN (NITROSTAT) 0.4 MG SL tablet Place 0.4 mg under the tongue every 5 (five) minutes as needed for chest pain.  . ranitidine (ZANTAC) 150 MG capsule Take 150 mg by mouth 2 (two) times daily.   [name] topiramate (TOPAMAX) 25 MG tablet Take 25 mg by mouth daily.    Allergies  Allergen Reactions  . Iodine Shortness Of Breath and Other (See Comments)    Pt states "Deadly"  . Prednisone     Facial flushing and swelling  . Shellfish Allergy Anaphylaxis and Other (See Comments)    " I almost died once"     Social History   Socioeconomic History  . Marital status: Widowed    Spouse name: None  . Number of children: None  . Years of education: None  . Highest education level: None  Social Needs  . Financial resource strain: None  . Food insecurity - worry: None  . Food insecurity - inability: None  . Transportation needs - medical: None  . Transportation needs - non-medical: None  Occupational History  . None  Tobacco Use  . Smoking status: Never Smoker  . Smokeless tobacco: Never Used  Substance and Sexual Activity  . Alcohol use: No  . Drug use: No  . Sexual activity: Not Currently  Other Topics Concern  . None  Social History Narrative   Widowed.  Lives home alone.  Retired.  12 th grade education.     \No routine exercise.   Long-term exposure to passive smoke.      In addition to her true allergies, she had a syncopal episode after starting Zoloft.  She is allergic to feathers (down).  Meloxicam causes upset stomach.  Prednisone makes her face red and hot.  Topamax causes diarrhea and gabapentin causes lightheadedness.    family history includes Gallstones in her brother and brother; Skin cancer in her mother.  Wt Readings from Last 3 Encounters:  01/27/17 154 lb (69.9 kg)  01/18/17 153 lb 14.4 oz (69.8 kg)  11/18/16  154 lb (69.9 kg)    PHYSICAL EXAM Ht 5\' 8"  (1.727 m)   Wt 154 lb (69.9 kg)   BMI 23.42 kg/m  -Final standing blood pressure is 117/96 mmHg with a heart rate is 95 bpm No data found.  Orthostatic vitals discussed above  Physical Exam  Constitutional: She is oriented to person, place, and time. She appears well-developed and well-nourished. No distress (Just nervous and anxious).  Appears older than her stated age.  HENT:  Head: Normocephalic and atraumatic.  Eyes: Conjunctivae and EOM are normal. Pupils are equal,  round, and reactive to light. No scleral icterus.  Intermittently tearful.  Especially when she talks about having lost her husband.  Neck: Normal range of motion. Neck supple. No hepatojugular reflux and no JVD present. Carotid bruit is not present.  Cardiovascular: Normal rate, regular rhythm and intact distal pulses. Exam reveals gallop and S4. Exam reveals no friction rub.  Murmur heard.  Medium-pitched harsh crescendo-decrescendo early systolic murmur is present with a grade of 1/6 at the upper right sternal border. Pulmonary/Chest: Effort normal and breath sounds normal. No respiratory distress. She has no wheezes. She has no rales.  Abdominal: Soft. Bowel sounds are normal. She exhibits no distension. There is no tenderness. There is no rebound.  Musculoskeletal: Normal range of motion. She exhibits no edema (Trivial pedal edema) or deformity.  Mild kyphosis  Neurological: She is alert and oriented to person, place, and time. No cranial nerve deficit.  Skin: Skin is warm and dry. No rash noted. No erythema.  Psychiatric: She has a normal mood and affect. Her behavior is normal. Judgment and thought content normal.  Nursing note and vitals reviewed.    Adult ECG Report  Rate: 89;  Rhythm: normal sinus rhythm and LAFB (-46 degrees).  Otherwise normal axis, intervals and durations.;   Narrative Interpretation: Relatively normal EKG no significant change from prior EKG  dated December 24, 2016  Other studies Reviewed: Additional studies/ records that were reviewed today include:  Recent Labs:  --Sleep --> As of April 18, 2016: TC 162, TG 58, HDL 44, LDL was 106.  A1c 5.7.  TSH 1.259. Lab Results  Component Value Date   CREATININE 1.0 01/18/2017   BUN 21.7 01/18/2017   NA 140 01/18/2017   K 4.1 01/18/2017   CL 103 05/26/2016   CO2 25 01/18/2017     ASSESSMENT / PLAN: Problem List Items Addressed This Visit    Abnormal cardiovascular stress test    History of abnormal stress test in 2015 but minimal CAD on cardiac cath.      Essential hypertension (Chronic)    She has baseline hypertension, but also has significant supine hypertension.  She really only notes symptoms if her blood pressures get in the 299 mmHg systolic range.  For now we will continue with current dose of amlodipine switch to a new medication) for baseline control.  It does appear that most of her symptoms occurring in the evening, and therefore I do agree with taking it in the evening or afternoon.    We will add a new as needed short acting medication for PRN occasions which may be better than using a longer acting medication such as amlodipine PRN. --Add 25 mg hydralazine 1-2 tablets daily PRN SBP>180-190 mmHg. -Also recommended using sublingual nitroglycerin for significant hypertension (systolic pressure greater than 200 mmHg. -Adequate hydration; foot elevation and support stockings      Relevant Medications   hydrALAZINE (APRESOLINE) 25 MG tablet   Essential tremor (Chronic)   Murmur (Chronic)    SEM on exam - c/w aortic sclerosis seen on echocardiogram.        Orthostatic hypotension - Primary (Chronic)    Profoundly orthostatic by evaluation today. I reiterated the importance of wearing the support stockings, adequate hydration.  --She is sleeping on a wedge pillow. Clarification: Midodrine is not Florinef.  I tried to explain to her the reason for using as  needed midodrine, but she continues to be reluctant to use it.  -Complicated situation with supine hypertension and severe  orthostatic hypotension.  She is on amlodipine for her baseline hypertension which is a vasodilator -> may be better to change to a beta-blocker.  -May consider Northera, but will need to look into appropriate BP control with this new medication.       Relevant Medications   hydrALAZINE (APRESOLINE) 25 MG tablet   Other Relevant Orders   EKG 12-Lead (Completed)   PRES (posterior reversible encephalopathy syndrome) (Chronic)    Is significant supine hypertension. -Sleep with a wedge pillow -Still need adequate blood pressure control, but may require Northera      Supine hypertension (Chronic)    She has significant supine hypertension with traumatic orthostatic drops. - may be a candidate for Northera.       Relevant Medications   hydrALAZINE (APRESOLINE) 25 MG tablet     Very complex, difficult history.  Multiple cardiology clinic notes, procedures, hospital notes and PCP notes to review.  The patient herself is a very poor historian and reluctant to make any changes.  I spent close to 40 minutes with her directly as well as an additional 1 hour and chart review.  50% of the time with the patient was spent in direct counseling.    Current medicines are reviewed at length with the patient today. (+/- concerns) complicated  The following changes have been made: see below  Patient Instructions  Medication instructions  ---start hydralazine 25 mg daily as needed if blood pressure is over 387 systolic ( top number of blood pressure)  Take in the evening.   Ii if blood pressure is over 564 systolic  Take nitroglycerin sublingual with the hydralazine  If blood pressure does not go down in 2 hours go to the ER.    IF BLOOD PRESSURE IS UNDER 332 SYSTOLIC  DRINK A BOTTLE OF WATER  AND ELEVATE FEET ABOVE  HEART.   DRINK A BOTTLE OF WATER WHEN YOU AWAKE UP IN  THE MORNING.     Your physician recommends that you schedule a follow-up appointment in [place].    Studies Ordered:   Orders Placed This Encounter  Procedures  . EKG 12-Lead      Glenetta Hew, M.D., M.S. Interventional Cardiologist   Pager # [phone] Phone # [phone] [address] [place], [place] [address]

## 2017-01-27 NOTE — Patient Instructions (Signed)
Medication instructions  ---start hydralazine 25 mg daily as needed if blood pressure is over 830 systolic ( top number of blood pressure)  Take in the evening.   Ii if blood pressure is over 940 systolic  Take nitroglycerin sublingual with the hydralazine  If blood pressure does not go down in 2 hours go to the ER.    IF BLOOD PRESSURE IS UNDER 768 SYSTOLIC  DRINK A BOTTLE OF WATER  AND ELEVATE FEET ABOVE  HEART.   DRINK A BOTTLE OF WATER WHEN YOU AWAKE UP IN THE MORNING.     Your physician recommends that you schedule a follow-up appointment in [place].

## 2017-01-29 DIAGNOSIS — I358 Other nonrheumatic aortic valve disorders: Secondary | ICD-10-CM | POA: Insufficient documentation

## 2017-01-29 DIAGNOSIS — I10 Essential (primary) hypertension: Secondary | ICD-10-CM | POA: Insufficient documentation

## 2017-01-30 ENCOUNTER — Encounter: Payer: Self-pay | Admitting: Cardiology

## 2017-01-30 DIAGNOSIS — I10 Essential (primary) hypertension: Secondary | ICD-10-CM | POA: Insufficient documentation

## 2017-01-30 NOTE — Assessment & Plan Note (Signed)
SEM on exam - c/w aortic sclerosis seen on echocardiogram.

## 2017-01-30 NOTE — Assessment & Plan Note (Signed)
History of abnormal stress test in 2015 but minimal CAD on cardiac cath.

## 2017-01-30 NOTE — Assessment & Plan Note (Signed)
Is significant supine hypertension. -Sleep with a wedge pillow -Still need adequate blood pressure control, but may require Northera

## 2017-01-30 NOTE — Assessment & Plan Note (Signed)
Profoundly orthostatic by evaluation today. I reiterated the importance of wearing the support stockings, adequate hydration.  --She is sleeping on a wedge pillow. Clarification: Midodrine is not Florinef.  I tried to explain to her the reason for using as needed midodrine, but she continues to be reluctant to use it.  -Complicated situation with supine hypertension and severe orthostatic hypotension.  She is on amlodipine for her baseline hypertension which is a vasodilator -> may be better to change to a beta-blocker.  -May consider Northera, but will need to look into appropriate BP control with this new medication.

## 2017-01-30 NOTE — Assessment & Plan Note (Signed)
She has significant supine hypertension with traumatic orthostatic drops. - may be a candidate for Northera.

## 2017-01-30 NOTE — Assessment & Plan Note (Signed)
She has baseline hypertension, but also has significant supine hypertension.  She really only notes symptoms if her blood pressures get in the 720 mmHg systolic range.  For now we will continue with current dose of amlodipine switch to a new medication) for baseline control.  It does appear that most of her symptoms occurring in the evening, and therefore I do agree with taking it in the evening or afternoon.    We will add a new as needed short acting medication for PRN occasions which may be better than using a longer acting medication such as amlodipine PRN. --Add 25 mg hydralazine 1-2 tablets daily PRN SBP>180-190 mmHg. -Also recommended using sublingual nitroglycerin for significant hypertension (systolic pressure greater than 200 mmHg. -Adequate hydration; foot elevation and support stockings

## 2017-02-04 ENCOUNTER — Inpatient Hospital Stay (HOSPITAL_COMMUNITY)
Admission: EM | Admit: 2017-02-04 | Discharge: 2017-02-07 | DRG: 312 | Disposition: A | Discharge status: Home / Self Care | Payer: Medicare Other | Attending: Internal Medicine | Admitting: Internal Medicine

## 2017-02-04 ENCOUNTER — Observation Stay (HOSPITAL_COMMUNITY): Payer: Medicare Other

## 2017-02-04 ENCOUNTER — Other Ambulatory Visit: Payer: Self-pay

## 2017-02-04 ENCOUNTER — Encounter (HOSPITAL_COMMUNITY): Payer: Self-pay | Admitting: *Deleted

## 2017-02-04 DIAGNOSIS — G903 Multi-system degeneration of the autonomic nervous system: Secondary | ICD-10-CM | POA: Diagnosis present

## 2017-02-04 DIAGNOSIS — Z8673 Personal history of transient ischemic attack (TIA), and cerebral infarction without residual deficits: Secondary | ICD-10-CM

## 2017-02-04 DIAGNOSIS — G250 Essential tremor: Secondary | ICD-10-CM | POA: Diagnosis present

## 2017-02-04 DIAGNOSIS — I159 Secondary hypertension, unspecified: Secondary | ICD-10-CM

## 2017-02-04 DIAGNOSIS — I951 Orthostatic hypotension: Principal | ICD-10-CM | POA: Diagnosis present

## 2017-02-04 DIAGNOSIS — R55 Syncope and collapse: Secondary | ICD-10-CM | POA: Diagnosis not present

## 2017-02-04 DIAGNOSIS — E876 Hypokalemia: Secondary | ICD-10-CM | POA: Diagnosis present

## 2017-02-04 DIAGNOSIS — Z7982 Long term (current) use of aspirin: Secondary | ICD-10-CM

## 2017-02-04 DIAGNOSIS — D473 Essential (hemorrhagic) thrombocythemia: Secondary | ICD-10-CM | POA: Diagnosis present

## 2017-02-04 DIAGNOSIS — N390 Urinary tract infection, site not specified: Secondary | ICD-10-CM | POA: Diagnosis not present

## 2017-02-04 DIAGNOSIS — I2510 Atherosclerotic heart disease of native coronary artery without angina pectoris: Secondary | ICD-10-CM | POA: Diagnosis present

## 2017-02-04 DIAGNOSIS — I10 Essential (primary) hypertension: Secondary | ICD-10-CM | POA: Diagnosis present

## 2017-02-04 DIAGNOSIS — Z79899 Other long term (current) drug therapy: Secondary | ICD-10-CM

## 2017-02-04 DIAGNOSIS — K219 Gastro-esophageal reflux disease without esophagitis: Secondary | ICD-10-CM | POA: Diagnosis present

## 2017-02-04 DIAGNOSIS — E039 Hypothyroidism, unspecified: Secondary | ICD-10-CM | POA: Diagnosis present

## 2017-02-04 DIAGNOSIS — I444 Left anterior fascicular block: Secondary | ICD-10-CM | POA: Diagnosis present

## 2017-02-04 DIAGNOSIS — I7300 Raynaud's syndrome without gangrene: Secondary | ICD-10-CM | POA: Diagnosis present

## 2017-02-04 DIAGNOSIS — R7303 Prediabetes: Secondary | ICD-10-CM | POA: Diagnosis present

## 2017-02-04 DIAGNOSIS — E785 Hyperlipidemia, unspecified: Secondary | ICD-10-CM | POA: Diagnosis present

## 2017-02-04 LAB — CBC
HCT: 37.6 % (ref 36.0–46.0)
HEMOGLOBIN: 11.7 g/dL — AB (ref 12.0–15.0)
MCH: 29.8 pg (ref 26.0–34.0)
MCHC: 31.1 g/dL (ref 30.0–36.0)
MCV: 95.9 fL (ref 78.0–100.0)
PLATELETS: 480 10*3/uL — AB (ref 150–400)
RBC: 3.92 MIL/uL (ref 3.87–5.11)
RDW: 14.3 % (ref 11.5–15.5)
WBC: 7.7 10*3/uL (ref 4.0–10.5)

## 2017-02-04 LAB — URINALYSIS, ROUTINE W REFLEX MICROSCOPIC
Bilirubin Urine: NEGATIVE
GLUCOSE, UA: NEGATIVE mg/dL
HGB URINE DIPSTICK: NEGATIVE
KETONES UR: NEGATIVE mg/dL
Nitrite: NEGATIVE
PROTEIN: NEGATIVE mg/dL
Specific Gravity, Urine: 1.006 (ref 1.005–1.030)
pH: 6 (ref 5.0–8.0)

## 2017-02-04 LAB — I-STAT TROPONIN, ED: Troponin i, poc: 0 ng/mL (ref 0.00–0.08)

## 2017-02-04 LAB — BASIC METABOLIC PANEL
ANION GAP: 7 (ref 5–15)
BUN: 18 mg/dL (ref 6–20)
CALCIUM: 8.7 mg/dL — AB (ref 8.9–10.3)
CHLORIDE: 107 mmol/L (ref 101–111)
CO2: 23 mmol/L (ref 22–32)
CREATININE: 0.88 mg/dL (ref 0.44–1.00)
GFR calc non Af Amer: >60 mL/min (ref >60)
Glucose, Bld: 162 mg/dL — ABNORMAL HIGH (ref 65–99)
Potassium: 3.4 mmol/L — ABNORMAL LOW (ref 3.5–5.1)
SODIUM: 137 mmol/L (ref 135–145)

## 2017-02-04 LAB — CBG MONITORING, ED: GLUCOSE-CAPILLARY: 102 mg/dL — AB (ref 65–99)

## 2017-02-04 MED ORDER — SODIUM CHLORIDE 0.9 % IV BOLUS (SEPSIS)
1000.0000 mL | Freq: Once | INTRAVENOUS | Status: AC
  Administered 2017-02-04: 1000 mL via INTRAVENOUS

## 2017-02-04 MED ORDER — HYDRALAZINE HCL 25 MG PO TABS
25.0000 mg | ORAL_TABLET | Freq: Once | ORAL | Status: AC
  Administered 2017-02-04: 25 mg via ORAL
  Filled 2017-02-04: qty 1

## 2017-02-04 MED ORDER — DEXTROSE 5 % IV SOLN
1.0000 g | INTRAVENOUS | Status: DC
  Administered 2017-02-04 – 2017-02-06 (×3): 1 g via INTRAVENOUS
  Filled 2017-02-04 (×4): qty 10

## 2017-02-04 MED ORDER — POTASSIUM CHLORIDE CRYS ER 20 MEQ PO TBCR
20.0000 meq | EXTENDED_RELEASE_TABLET | Freq: Once | ORAL | Status: AC
  Administered 2017-02-04: 20 meq via ORAL
  Filled 2017-02-04: qty 1

## 2017-02-04 NOTE — H&P (Signed)
TRH H&P   Patient Demographics:    Deanna Hill, is a 75 y.o. female  MRN: [id number]   DOB - [birth year]  Admit Date - 02/04/2017  Outpatient Primary MD for the patient is Hulan Fess, MD  Referring MD/NP/PA: Kaleen Odea, Quintella Reichert  Outpatient Specialists:  Jerrol Banana   Patient coming from: home  Chief Complaint  Patient presents with  . Dizziness      HPI:    Deanna Hill  is a 76 y.o. female, w hypertension, hyperlipidemia, hypothyroidism, CAD, thrombocytosis, orthostatic hypotension apparently c/o dizziness for the past week.  She was dizzy this am. .denies headache, vertigo, cp,  sob, focal numbness, tingling, weakness, dysarthria, vision change.  Pt apparently went home after dizziness this am and was sitting in chair and had unwitnessed sycnope for a few minutes.  Pt states might have had slight palpiations and increase in tremor when she awoke.  Pt denies seizure, tongue lac and incontinence.  Pt has had dizziness and orthostasis over the past year.   In ED,  Na 137, K 3.4 Bun 18, Creatinine 0.88 Glucose 162 Wbc 7.7, Hgb 11.7, Plt 480 ua wbc 6-30  CT brain pending,     Pt will be admitted for syncope and uncontrolled hypertension, and uti   Review of systems:    In addition to the HPI above,   + vertigo in the past.  No Fever-chills, No Headache, No changes with Vision or hearing, No problems swallowing food or Liquids, No Chest pain, Cough or Shortness of Breath, No Abdominal pain, No Nausea or Vommitting, Bowel movements are regular, No Blood in stool or Urine, No dysuria, No new skin rashes or bruises, No new joints pains-aches,  No new weakness, tingling, numbness in any extremity, No recent weight gain or loss, No polyuria, polydypsia or polyphagia, No significant Mental Stressors.  A full 10 point Review of  Systems was done, except as stated above, all other Review of Systems were negative.   With Past History of the following :    Past Medical History:  Diagnosis Date  . Anxiety   . Coronary artery disease, non-occlusive 05/2013   Cath for Abn Myoview => mild CAD, 30% pCx. Otw normal Coronaries.  Normlal EF.  . Essential tremor 06/19/2015  . GERD (gastroesophageal reflux disease)   . Hyperlipidemia   . Hypertension   . Hypothyroidism   . IBS (irritable bowel syndrome)   . Orthostatic hypotension 05/22/2016  . Senile calcific aortic valve sclerosis 04/2016   2D echo showed aortic sclerosis but no stenosis.  Normal wall motion.  GR 2 DD  . Thrombocytosis ([place]) 03/11/2011      Past Surgical History:  Procedure Laterality Date  . ABDOMINAL HYSTERECTOMY  1979  . CARDIAC CATHETERIZATION  05/2013   Dr. Wynonia Lawman: Mild CAD, 30%pCx.  Otherwise nonobstructive disease.  Normal LV function.  [name]  CATARACT EXTRACTION Bilateral 2015   Dr. Herbert Deaner  . CHOLECYSTECTOMY, LAPAROSCOPIC  2001  . LEFT HEART CATHETERIZATION WITH CORONARY ANGIOGRAM N/A 06/08/2013   Procedure: LEFT HEART CATHETERIZATION WITH CORONARY ANGIOGRAM;  Surgeon: Jacolyn Reedy, MD;  Location: [facility] CATH LAB;  Service: Cardiovascular;  Laterality: N/A;  . TOE SURGERY Left 1994  . TONSILLECTOMY  1952      Social History:     Social History   Tobacco Use  . Smoking status: Never Smoker  . Smokeless tobacco: Never Used  Substance Use Topics  . Alcohol use: No     Lives - at home  Mobility - walks by self  Family History :     Family History  Problem Relation Age of Onset  . Skin cancer Mother   . Gallstones Brother   . Gallstones Brother       Home Medications:   Prior to Admission medications   Medication Sig Start Date End Date Taking? Authorizing Provider  acetaminophen (TYLENOL) 325 MG tablet Take 650 mg by mouth daily as needed for mild pain.   Yes [provider]  ALPRAZolam (XANAX) 0.25 MG tablet Take  0.5 tablets (0.125 mg total) by mouth 3 (three) times daily as needed for anxiety. 06/19/15  Yes Kathrynn Ducking, MD  amLODipine (NORVASC) 5 MG tablet Take 0.5 tablets (2.5 mg total) daily by mouth. 01/18/17  Yes Nicholas Lose, MD  aspirin 325 MG tablet Take 325 mg by mouth daily.    Yes [provider]  BIOTIN PO Take 1 capsule by mouth daily.   Yes [provider]  Cholecalciferol (VITAMIN D) 2000 UNITS CAPS Take 1 capsule by mouth daily.   Yes [provider]  Evening Primrose Oil 500 MG CAPS Take 1 capsule by mouth daily.    Yes [provider]  hydrALAZINE (APRESOLINE) 25 MG tablet Take 1 tablet (25 mg total) daily as needed by mouth. If blood pressure over 160 systolic,take in the evening. 01/27/17 04/27/17 Yes Leonie Man, MD  hydroxyurea (HYDREA) 500 MG capsule Take 1 capsule (500 mg total) by mouth every Monday, Wednesday, and Friday. 07/24/16  Yes Nicholas Lose, MD  levothyroxine (SYNTHROID, LEVOTHROID) 88 MCG tablet Take 88 mcg daily by mouth.    Yes [provider]  metroNIDAZOLE (METROCREAM) 0.75 % cream Apply 1 application topically daily as needed (for rosacea).   Yes [provider]  nitroGLYCERIN (NITROSTAT) 0.4 MG SL tablet Place 0.4 mg under the tongue every 5 (five) minutes as needed for chest pain.   Yes [provider]  ranitidine (ZANTAC) 150 MG capsule Take 150 mg by mouth 2 (two) times daily.    Yes [provider]  topiramate (TOPAMAX) 25 MG tablet Take 25 mg by mouth daily.   Yes [provider]     Allergies:     Allergies  Allergen Reactions  . Iodine Shortness Of Breath and Other (See Comments)    Pt states "Deadly"  . Prednisone     Facial flushing and swelling  . Shellfish Allergy Anaphylaxis and Other (See Comments)    " I almost died once"      Physical Exam:   Vitals  Blood pressure (!) 185/81, pulse 82, temperature 98.2 F (36.8 C), temperature source Oral, resp.  rate 15, SpO2 100 %.   1. General  lying in bed in NAD,   2. Normal affect and insight, Not Suicidal or Homicidal, Awake Alert, Oriented X 3.  3. No  F.N deficits, ALL C.Nerves Intact, Strength 5/5 all 4 extremities, Sensation intact all 4 extremities, Plantars down going.  4. Ears and Eyes appear Normal, Conjunctivae clear, PERRLA. Moist Oral Mucosa.  5. Supple Neck, No JVD, No cervical lymphadenopathy appriciated, No Carotid Bruits.  6. Symmetrical Chest wall movement, Good air movement bilaterally, CTAB.  7. RRR, s1 s2 1/6 sem rusb  8. Positive Bowel Sounds, Abdomen Soft, No tenderness, No organomegaly appriciated,No rebound -guarding or rigidity.  9.  No Cyanosis, Normal Skin Turgor, No Skin Rash or Bruise.  10. Good muscle tone,  joints appear normal , no effusions, Normal ROM.  11. No Palpable Lymph Nodes in Neck or Axillae  Raynauds, + tremor, slight pill rolling of the left hand   Data Review:    CBC Recent Labs  Lab 02/04/17 1923  WBC 7.7  HGB 11.7*  HCT 37.6  PLT 480*  MCV 95.9  MCH 29.8  MCHC 31.1  RDW 14.3   ------------------------------------------------------------------------------------------------------------------  Chemistries  Recent Labs  Lab 02/04/17 1923  NA 137  K 3.4*  CL 107  CO2 23  GLUCOSE 162*  BUN 18  CREATININE 0.88  CALCIUM 8.7*   ------------------------------------------------------------------------------------------------------------------ estimated creatinine clearance is 54 mL/min (by C-G formula based on SCr of 0.88 mg/dL). ------------------------------------------------------------------------------------------------------------------ No results for input(s): TSH, T4TOTAL, T3FREE, THYROIDAB in the last 72 hours.  Invalid input(s): FREET3  Coagulation profile No results for input(s): INR, PROTIME in the last 168  hours. ------------------------------------------------------------------------------------------------------------------- No results for input(s): DDIMER in the last 72 hours. -------------------------------------------------------------------------------------------------------------------  Cardiac Enzymes No results for input(s): CKMB, TROPONINI, MYOGLOBIN in the last 168 hours.  Invalid input(s): CK ------------------------------------------------------------------------------------------------------------------ No results found for: BNP   ---------------------------------------------------------------------------------------------------------------  Urinalysis    Component Value Date/Time   COLORURINE STRAW (A) 02/04/2017 2013   APPEARANCEUR CLEAR 02/04/2017 2013   LABSPEC 1.006 02/04/2017 2013   PHURINE 6.0 02/04/2017 2013   GLUCOSEU NEGATIVE 02/04/2017 2013   HGBUR NEGATIVE 02/04/2017 2013   [place] NEGATIVE 02/04/2017 2013   [place] NEGATIVE 02/04/2017 2013   PROTEINUR NEGATIVE 02/04/2017 2013   UROBILINOGEN 0.2 11/20/2013 0630   NITRITE NEGATIVE 02/04/2017 2013   LEUKOCYTESUR MODERATE (A) 02/04/2017 2013    ----------------------------------------------------------------------------------------------------------------   Imaging Results:    No results found.   Assessment & Plan:    Principal Problem:   Syncope Active Problems:   Orthostatic hypotension   Hypokalemia   UTI (urinary tract infection)    Syncope ? Orthostatic hypotension Tele Trop I q6h x3 D dimer If d dimer is positive then CTA chest r/o PE CT brain EEG Carotid ultrasound Cardiac echo  Hypokalemia Replete Check cmp in am  UTI Await urine culture Rocephin 1gm iv qday  Orthostatic hypotension Compression stockings Avoid sudden standing type movement.  Hyperglycemia Check hga1c  Thrombocytosis Continue hydroxyurea Check cbc in am  Tremor Needs outpatient follow  up  Raynauds Needs outpatient follow up  Hypothyroidism Cont levothyroxine Check tsh   DVT Prophylaxis Lovenox - SCDs   AM Labs Ordered, also please review Full Orders  Family Communication: Admission, patients condition and plan of care including tests being ordered have been discussed with the patient  who indicate understanding and agree with the plan and Code Status.  Code Status FULL CODE  Likely DC to  home  Condition GUARDED    Consults called: none  Admission status: observation   Time spent in minutes : 45   Jani Gravel M.D on 02/04/2017 at 10:14 PM  Between 7pm to 7am -  Pager - [phone] . After 7am go to [email or web address] - password [facility]  [facility] - Office  [phone]

## 2017-02-04 NOTE — ED Provider Notes (Signed)
[facility] EMERGENCY DEPARTMENT Provider Note   CSN: [id number] Arrival date & time: 02/04/17  1846     History   Chief Complaint Chief Complaint  Patient presents with  . Dizziness    HPI ARRIA NAIM is a 76 y.o. female.  HPI MARLAYSIA LENIG is a 76 y.o. female with hx of anxiety, GERD, tremor, htn, PRES syndrome just in February, presents to emergency department with multiple complaints.  Patient has had at least 3 near syncopal episodes today and states that she believes she fainted while sitting the chair earlier.  She states she has been feeling unwell since February, but symptoms worsened in the last week.  She reports generalized malaise, generalized weakness, intermittent chest pains, nausea that is worse in the morning, weakness in her arms or legs.  She reports headaches that are worse at nighttime, states they are mainly in the back of her head.  Denies any blurred vision.  States she is not able to walk as well.  States that her symptoms also get worse whenever she is standing up or moving.  Patient was seen by cardiologist several days ago, had some changes with her medications made but she states she has not started taking new medications yet.  She was noted to have severe orthostatic hypotension on their exam.  Patient states she has been taking her blood pressures at home over the last week and noted that she is hypertensive more than usual.  She was admitted in February for PRES syndrome accelerated by hypertension.  Past Medical History:  Diagnosis Date  . Anxiety   . Coronary artery disease, non-occlusive 05/2013   Cath for Abn Myoview => mild CAD, 30% pCx. Otw normal Coronaries.  Normlal EF.  . Essential tremor 06/19/2015  . GERD (gastroesophageal reflux disease)   . Hyperlipidemia   . Hypertension   . Hypothyroidism   . IBS (irritable bowel syndrome)   . Orthostatic hypotension 05/22/2016  . Senile calcific aortic valve sclerosis 04/2016     2D echo showed aortic sclerosis but no stenosis.  Normal wall motion.  GR 2 DD  . Thrombocytosis ([place]) 03/11/2011    Patient Active Problem List   Diagnosis Date Noted  . Supine hypertension 01/30/2017  . Murmur 01/29/2017  . Essential hypertension 01/29/2017  . Orthostatic hypotension 05/22/2016  . Difficulty in walking, not elsewhere classified   . Stroke ([place]) 04/19/2016  . Other secondary hypertension 04/18/2016  . Cerebrovascular accident (CVA) ([place]) 04/18/2016  . PRES (posterior reversible encephalopathy syndrome)   . Cerebral embolism with cerebral infarction 04/17/2016  . Iron deficiency anemia 10/17/2015  . Essential tremor 06/19/2015  . Encounter for long-term (current) use of medications 01/12/2014  . Anemia due to chemotherapy 07/14/2013  . Angina pectoris ([place]) 06/08/2013  . Abnormal cardiovascular stress test 06/08/2013  . Hypothyroidism, acquired   . Hyperlipidemia   . Essential thrombocytosis ([place]) 03/11/2011    Past Surgical History:  Procedure Laterality Date  . ABDOMINAL HYSTERECTOMY  1979  . CARDIAC CATHETERIZATION  05/2013   Dr. Wynonia Lawman: Mild CAD, 30%pCx.  Otherwise nonobstructive disease.  Normal LV function.  [name] CATARACT EXTRACTION Bilateral 2015   Dr. Herbert Deaner  . CHOLECYSTECTOMY, LAPAROSCOPIC  2001  . LEFT HEART CATHETERIZATION WITH CORONARY ANGIOGRAM N/A 06/08/2013   Procedure: LEFT HEART CATHETERIZATION WITH CORONARY ANGIOGRAM;  Surgeon: Jacolyn Reedy, MD;  Location: [facility] CATH LAB;  Service: Cardiovascular;  Laterality: N/A;  . TOE SURGERY Left 1994  . TONSILLECTOMY  1952  OB History    No data available       Home Medications    Prior to Admission medications   Medication Sig Start Date End Date Taking? Authorizing Provider  acetaminophen (TYLENOL) 325 MG tablet Take 650 mg by mouth daily as needed for mild pain.    [provider]  ALPRAZolam Duanne Moron) 0.25 MG tablet Take 0.5 tablets (0.125 mg total) by mouth 3 (three) times  daily as needed for anxiety. 06/19/15   Kathrynn Ducking, MD  amLODipine (NORVASC) 5 MG tablet Take 0.5 tablets (2.5 mg total) daily by mouth. 01/18/17   Nicholas Lose, MD  aspirin 325 MG tablet Take 325 mg by mouth daily.     [provider]  BIOTIN PO Take 1 capsule by mouth daily.    [provider]  Cholecalciferol (VITAMIN D) 2000 UNITS CAPS Take 1 capsule by mouth daily.    [provider]  Evening Primrose Oil 500 MG CAPS Take 1 capsule by mouth 2 (two) times daily.    [provider]  hydrALAZINE (APRESOLINE) 25 MG tablet Take 1 tablet (25 mg total) daily as needed by mouth. If blood pressure over 161 systolic,take in the evening. 01/27/17 04/27/17  Leonie Man, MD  hydroxyurea (HYDREA) 500 MG capsule Take 1 capsule (500 mg total) by mouth every Monday, Wednesday, and Friday. 07/24/16   Nicholas Lose, MD  levothyroxine (SYNTHROID, LEVOTHROID) 88 MCG tablet Take 88 mcg daily by mouth.     [provider]  metroNIDAZOLE (METROCREAM) 0.75 % cream Apply 1 application topically daily as needed (for rosacea).    [provider]  nitroGLYCERIN (NITROSTAT) 0.4 MG SL tablet Place 0.4 mg under the tongue every 5 (five) minutes as needed for chest pain.    [provider]  ranitidine (ZANTAC) 150 MG capsule Take 150 mg by mouth 2 (two) times daily.     [provider]  topiramate (TOPAMAX) 25 MG tablet Take 25 mg by mouth daily.    [provider]    Family History Family History  Problem Relation Age of Onset  . Skin cancer Mother   . Gallstones Brother   . Gallstones Brother     Social History Social History   Tobacco Use  . Smoking status: Never Smoker  . Smokeless tobacco: Never Used  Substance Use Topics  . Alcohol use: No  . Drug use: No     Allergies   Iodine; Prednisone; and Shellfish allergy   Review of Systems Review of Systems  Constitutional: Positive for fatigue. Negative for chills  and fever.  Respiratory: Negative for cough, chest tightness and shortness of breath.   Cardiovascular: Positive for palpitations. Negative for chest pain and leg swelling.  Gastrointestinal: Positive for nausea. Negative for abdominal pain, diarrhea and vomiting.  Genitourinary: Positive for vaginal pain. Negative for dysuria, flank pain, pelvic pain, vaginal bleeding and vaginal discharge.  Musculoskeletal: Negative for arthralgias, myalgias, neck pain and neck stiffness.  Skin: Negative for rash.  Neurological: Positive for dizziness, syncope, weakness and headaches. Negative for seizures and numbness.  All other systems reviewed and are negative.    Physical Exam Updated Vital Signs BP (!) 177/87   Pulse 92   Temp 98.2 F (36.8 C) (Oral)   Resp 16   SpO2 100%   Physical Exam  Constitutional: She is oriented to person, place, and time. She appears well-developed and well-nourished. No distress.  HENT:  Head: Normocephalic.  Eyes: Conjunctivae are normal.  Neck: Neck supple.  Cardiovascular: Normal rate, regular rhythm and normal heart sounds.  Pulmonary/Chest: Effort normal and breath sounds normal. No respiratory distress. She has no wheezes. She has no rales.  Abdominal: Soft. Bowel sounds are normal. She exhibits no distension. There is no tenderness. There is no rebound.  Musculoskeletal: She exhibits no edema.  Neurological: She is alert and oriented to person, place, and time.  5/5 and equal upper and lower extremity strength bilaterally. Equal grip strength bilaterally. Normal finger to nose and heel to shin. No pronator drift. Patellar reflexes 2+   Skin: Skin is warm and dry.  Psychiatric: She has a normal mood and affect. Her behavior is normal.  Nursing note and vitals reviewed.    ED Treatments / Results  Labs (all labs ordered are listed, but only abnormal results are displayed) Labs Reviewed  CBC - Abnormal; Notable for the following components:       Result Value   Hemoglobin 11.7 (*)    Platelets 480 (*)    All other components within normal limits  BASIC METABOLIC PANEL  URINALYSIS, ROUTINE W REFLEX MICROSCOPIC  CBG MONITORING, ED  I-STAT TROPONIN, ED    EKG  EKG Interpretation  Date/Time:  Thursday February 04 2017 18:56:57 EST Ventricular Rate:  94 PR Interval:  140 QRS Duration: 82 QT Interval:  354 QTC Calculation: 442 R Axis:   -56 Text Interpretation:  Normal sinus rhythm Left anterior fascicular block Inferior-posterior infarct , age undetermined Abnormal ECG Confirmed by Quintella Reichert [phone]) on 02/04/2017 7:03:41 PM       Radiology  Procedures Procedures (including critical care time)  Medications Ordered in ED Medications - No data to display   Initial Impression / Assessment and Plan / ED Course  I have reviewed the triage vital signs and the nursing notes.  Pertinent labs & imaging results that were available during my care of the patient were reviewed by me and considered in my medical decision making (see chart for details).     Patient in the emergency department generalized weakness, dizziness, possible syncopal episode today and at least 3 near syncopal episodes.  History of orthostatic hypotension and PRES syndrome.  Will get labs, troponin, EKG, orthostatic vital signs.  Will monitor.  Patient is significantly orthostatic, however also hypertensive.  Discussed with Dr. Ralene Bathe who is seen patient as well.  At this time labs are unremarkable.  Given 3 near syncopal episodes and one syncopal episode, will admit for observation.  Will monitor for arrhythmias.  Hydralazine ordered for her hypertension NAD.     Discussed with hospitalist who will admit patient.  .vi  Final Clinical Impressions(s) / ED Diagnoses   Final diagnoses:  Syncope, unspecified syncope type  Secondary hypertension    ED Discharge Orders    None       Jeannett Senior, PA-C 02/05/17 1752    Quintella Reichert,  MD 02/05/17 1954

## 2017-02-04 NOTE — ED Triage Notes (Signed)
To ED for eval of dizziness over the past few weeks intermittently. States if she moves her head to quickly the dizziness becomes worse. States this dizziness has just become worse. Pt with tremors - she states she has hx of same but has become worse. Alert and oriented. No neuro deficits noted.

## 2017-02-04 NOTE — ED Notes (Signed)
Attempted report X1

## 2017-02-05 ENCOUNTER — Encounter (HOSPITAL_COMMUNITY): Payer: Self-pay | Admitting: General Practice

## 2017-02-05 ENCOUNTER — Other Ambulatory Visit: Payer: Self-pay

## 2017-02-05 ENCOUNTER — Observation Stay (HOSPITAL_COMMUNITY): Payer: Medicare Other

## 2017-02-05 ENCOUNTER — Observation Stay (HOSPITAL_BASED_OUTPATIENT_CLINIC_OR_DEPARTMENT_OTHER): Payer: Medicare Other

## 2017-02-05 DIAGNOSIS — I361 Nonrheumatic tricuspid (valve) insufficiency: Secondary | ICD-10-CM

## 2017-02-05 DIAGNOSIS — N390 Urinary tract infection, site not specified: Secondary | ICD-10-CM | POA: Diagnosis not present

## 2017-02-05 DIAGNOSIS — E876 Hypokalemia: Secondary | ICD-10-CM | POA: Diagnosis not present

## 2017-02-05 DIAGNOSIS — I951 Orthostatic hypotension: Principal | ICD-10-CM

## 2017-02-05 DIAGNOSIS — I159 Secondary hypertension, unspecified: Secondary | ICD-10-CM

## 2017-02-05 DIAGNOSIS — R55 Syncope and collapse: Secondary | ICD-10-CM

## 2017-02-05 LAB — COMPREHENSIVE METABOLIC PANEL
ALT: 12 U/L — ABNORMAL LOW (ref 14–54)
ANION GAP: 9 (ref 5–15)
AST: 15 U/L (ref 15–41)
Albumin: 3.8 g/dL (ref 3.5–5.0)
Alkaline Phosphatase: 51 U/L (ref 38–126)
BILIRUBIN TOTAL: 0.8 mg/dL (ref 0.3–1.2)
BUN: 12 mg/dL (ref 6–20)
CALCIUM: 8.7 mg/dL — AB (ref 8.9–10.3)
CHLORIDE: 110 mmol/L (ref 101–111)
CO2: 22 mmol/L (ref 22–32)
CREATININE: 0.87 mg/dL (ref 0.44–1.00)
Glucose, Bld: 97 mg/dL (ref 65–99)
Potassium: 3.8 mmol/L (ref 3.5–5.1)
Sodium: 141 mmol/L (ref 135–145)
TOTAL PROTEIN: 6.5 g/dL (ref 6.5–8.1)

## 2017-02-05 LAB — CBC
HCT: 36.8 % (ref 36.0–46.0)
Hemoglobin: 11.6 g/dL — ABNORMAL LOW (ref 12.0–15.0)
MCH: 29.8 pg (ref 26.0–34.0)
MCHC: 31.5 g/dL (ref 30.0–36.0)
MCV: 94.6 fL (ref 78.0–100.0)
PLATELETS: 445 10*3/uL — AB (ref 150–400)
RBC: 3.89 MIL/uL (ref 3.87–5.11)
RDW: 14 % (ref 11.5–15.5)
WBC: 9.2 10*3/uL (ref 4.0–10.5)

## 2017-02-05 LAB — TROPONIN I
Troponin I: <0.03 ng/mL (ref <0.03)
Troponin I: <0.03 ng/mL (ref <0.03)

## 2017-02-05 LAB — TSH: TSH: 1.928 u[IU]/mL (ref 0.350–4.500)

## 2017-02-05 LAB — HEMOGLOBIN A1C
HEMOGLOBIN A1C: 5.9 % — AB (ref 4.8–5.6)
MEAN PLASMA GLUCOSE: 122.63 mg/dL

## 2017-02-05 LAB — ECHOCARDIOGRAM COMPLETE
Height: 68 in
Weight: 2500.9 oz

## 2017-02-05 LAB — D-DIMER, QUANTITATIVE (NOT AT ARMC)

## 2017-02-05 MED ORDER — ENOXAPARIN SODIUM 40 MG/0.4ML ~~LOC~~ SOLN
40.0000 mg | SUBCUTANEOUS | Status: DC
  Administered 2017-02-05 – 2017-02-07 (×3): 40 mg via SUBCUTANEOUS
  Filled 2017-02-05 (×3): qty 0.4

## 2017-02-05 MED ORDER — TOPIRAMATE 25 MG PO TABS
25.0000 mg | ORAL_TABLET | Freq: Every day | ORAL | Status: DC
  Administered 2017-02-05 – 2017-02-07 (×3): 25 mg via ORAL
  Filled 2017-02-05 (×3): qty 1

## 2017-02-05 MED ORDER — ACETAMINOPHEN 325 MG PO TABS
650.0000 mg | ORAL_TABLET | Freq: Four times a day (QID) | ORAL | Status: DC | PRN
  Administered 2017-02-05 – 2017-02-06 (×3): 650 mg via ORAL
  Filled 2017-02-05 (×3): qty 2

## 2017-02-05 MED ORDER — AMLODIPINE BESYLATE 5 MG PO TABS
5.0000 mg | ORAL_TABLET | Freq: Every day | ORAL | Status: DC
  Administered 2017-02-06 – 2017-02-07 (×2): 5 mg via ORAL
  Filled 2017-02-05 (×2): qty 1

## 2017-02-05 MED ORDER — ACETAMINOPHEN 650 MG RE SUPP: 650.0000 mg | Freq: Four times a day (QID) | RECTAL | Status: DC | PRN

## 2017-02-05 MED ORDER — AMLODIPINE BESYLATE 5 MG PO TABS
2.5000 mg | ORAL_TABLET | Freq: Every day | ORAL | Status: DC
  Administered 2017-02-05: 2.5 mg via ORAL
  Filled 2017-02-05: qty 1

## 2017-02-05 MED ORDER — HYDROXYUREA 500 MG PO CAPS
500.0000 mg | ORAL_CAPSULE | ORAL | Status: DC
  Administered 2017-02-05: 500 mg via ORAL
  Filled 2017-02-05: qty 1

## 2017-02-05 MED ORDER — SODIUM CHLORIDE 0.9% FLUSH: 3.0000 mL | INTRAVENOUS | Status: DC | PRN

## 2017-02-05 MED ORDER — SODIUM CHLORIDE 0.9 % IV SOLN: 250.0000 mL | INTRAVENOUS | Status: DC | PRN

## 2017-02-05 MED ORDER — SODIUM CHLORIDE 0.9% FLUSH
3.0000 mL | Freq: Two times a day (BID) | INTRAVENOUS | Status: DC
  Administered 2017-02-05 – 2017-02-07 (×4): 3 mL via INTRAVENOUS

## 2017-02-05 MED ORDER — FAMOTIDINE 20 MG PO TABS
10.0000 mg | ORAL_TABLET | Freq: Two times a day (BID) | ORAL | Status: DC
  Administered 2017-02-05 – 2017-02-07 (×5): 10 mg via ORAL
  Filled 2017-02-05 (×5): qty 1

## 2017-02-05 MED ORDER — LEVOTHYROXINE SODIUM 88 MCG PO TABS
88.0000 ug | ORAL_TABLET | Freq: Every day | ORAL | Status: DC
  Administered 2017-02-05 – 2017-02-07 (×3): 88 ug via ORAL
  Filled 2017-02-05 (×3): qty 1

## 2017-02-05 MED ORDER — GADOBENATE DIMEGLUMINE 529 MG/ML IV SOLN
15.0000 mL | Freq: Once | INTRAVENOUS | Status: AC
  Administered 2017-02-05: 15 mL via INTRAVENOUS

## 2017-02-05 MED ORDER — NITROGLYCERIN 0.4 MG SL SUBL: 0.4000 mg | SUBLINGUAL_TABLET | SUBLINGUAL | Status: DC | PRN

## 2017-02-05 MED ORDER — VITAMIN D 1000 UNITS PO TABS
2000.0000 [IU] | ORAL_TABLET | Freq: Every day | ORAL | Status: DC
Start: 2017-02-05 — End: 2017-02-07
  Administered 2017-02-05 – 2017-02-07 (×3): 2000 [IU] via ORAL
  Filled 2017-02-05 (×4): qty 2

## 2017-02-05 MED ORDER — HYDRALAZINE HCL 25 MG PO TABS
25.0000 mg | ORAL_TABLET | Freq: Three times a day (TID) | ORAL | Status: DC
  Administered 2017-02-05: 25 mg via ORAL
  Filled 2017-02-05 (×2): qty 1

## 2017-02-05 MED ORDER — ASPIRIN 325 MG PO TABS
325.0000 mg | ORAL_TABLET | Freq: Every day | ORAL | Status: DC
Start: 2017-02-05 — End: 2017-02-07
  Administered 2017-02-05 – 2017-02-07 (×3): 325 mg via ORAL
  Filled 2017-02-05 (×3): qty 1

## 2017-02-05 MED ORDER — ALPRAZOLAM 0.25 MG PO TABS
0.1250 mg | ORAL_TABLET | Freq: Three times a day (TID) | ORAL | Status: DC | PRN
  Administered 2017-02-05 – 2017-02-06 (×2): 0.125 mg via ORAL
  Filled 2017-02-05 (×2): qty 1

## 2017-02-05 NOTE — Progress Notes (Signed)
  Echocardiogram 2D Echocardiogram has been performed.  Bobbye Charleston 02/05/2017, 11:47 AM

## 2017-02-05 NOTE — Progress Notes (Signed)
PROGRESS NOTE  Deanna Hill RWE:[id number] DOB: 06/10/1940 DOA: 02/04/2017 PCP: Hulan Fess, MD  HPI/Recap of past 24 hours: HPI from Dr Jani Gravel on 02/04/17 Deanna Hill  is a 76 y.o. female, with history of hypertension, hyperlipidemia, hypothyroidism, CAD, thrombocytosis, orthostatic hypotension apparently c/o dizziness for the past week.  Patient reported having unwitnessed syncope for a few minutes while seated on a chair.  The patient also reports intermittent palpitations over past couple of weeks.  Patient also reports frontal headache, but no focal neurologic deficit, although reported in the past have been numbness and tingling of her upper extremity.  Patient does have a history of orthostatic hypotension, vertigo and tremors.  Patient currently denied any focal numbness, tingling, weakness, dysarthria, vision change, seizure, tongue laceration, incontinence, chest pain, shortness of breath, abdominal pain, nausea/vomiting, diarrhea/constipation, fever/chills.  Pt admitted for syncope and uncontrolled hypertension.  Assessment/Plan: Principal Problem:   Syncope Active Problems:   Orthostatic hypotension   Hypokalemia   UTI (urinary tract infection)  #Syncope Patient is currently awake, alert, oriented x3 ??  Orthostatic hypotension versus worsening vertigo, rule out cardiac causes Unsure if she was orthostatic on presentation Troponin x3 neg CT brain negative MRI negative Echo with EF of 60-65%, otherwise normal Carotid ultrasound pending EEG pending PT/OT pending  #History of orthostatic hypotension Continue compression stockings Monitor closely  #?? UTI UA showed moderate leukocytosis, WBC 6-30 UC pending Was started on IV ceftriaxone, will continue pending results of urine culture  #Prediabetes A1c 5.9 PCP to follow-up as an outpatient  #Hypothyroidism TSH within normal limits Continue levothyroxine  #Thrombocytosis Continue  hydroxyurea Follow-up as an outpatient  #Tremor Ongoing, chronic condition Follow-up as an outpatient  #Raynauds Ongoing, follow-up as an outpatient    Code Status: Full  Family Communication: None at bedside  Disposition Plan: Once workup complete   Consultants:  None  Procedures:  None  Antimicrobials:  IV ceftriaxone  DVT prophylaxis: Lovenox   Objective: Vitals:   02/04/17 2359 02/05/17 0451 02/05/17 0958 02/05/17 1828  BP: (!) 194/79 (!) 184/50 (!) 164/71 (!) 177/77  Pulse: 94 83 80 93  Resp: 17 17 17 17   Temp: 98.2 F (36.8 C) 98.5 F (36.9 C) 98.4 F (36.9 C) 98.1 F (36.7 C)  TempSrc: Oral Oral Oral Oral  SpO2: 100% 95% 96% 99%  Weight: 70.9 kg (156 lb 4.9 oz)     Height: 5\' 8"  (1.727 m)       Intake/Output Summary (Last 24 hours) at 02/05/2017 1946 Last data filed at 02/05/2017 1828 Gross per 24 hour  Intake 840 ml  Output 2575 ml  Net -1735 ml   Filed Weights   02/04/17 2359  Weight: 70.9 kg (156 lb 4.9 oz)    Exam:   General: Awake, alert, oriented x3  Cardiovascular: S1-S2 present, no added heart sounds  Respiratory: Chest clear bilaterally  Abdomen: Soft, nontender, nondistended, bowel sounds present  Musculoskeletal: No pedal edema  Skin: Normal  Psychiatry: Appears a bit anxious  Neuro: No focal neurologic deficit noted, slight hand tremor noted especially of the left hand   Data Reviewed: CBC: Recent Labs  Lab 02/04/17 1923 02/05/17 0631  WBC 7.7 9.2  HGB 11.7* 11.6*  HCT 37.6 36.8  MCV 95.9 94.6  PLT 480* 619*   Basic Metabolic Panel: Recent Labs  Lab 02/04/17 1923 02/05/17 0631  NA 137 141  K 3.4* 3.8  CL 107 110  CO2 23 22  GLUCOSE 162* 97  BUN  18 12  CREATININE 0.88 0.87  CALCIUM 8.7* 8.7*   GFR: Estimated Creatinine Clearance: 54.6 mL/min (by C-G formula based on SCr of 0.87 mg/dL). Liver Function Tests: Recent Labs  Lab 02/05/17 0631  AST 15  ALT 12*  ALKPHOS 51  BILITOT 0.8   PROT 6.5  ALBUMIN 3.8   No results for input(s): LIPASE, AMYLASE in the last 168 hours. No results for input(s): AMMONIA in the last 168 hours. Coagulation Profile: No results for input(s): INR, PROTIME in the last 168 hours. Cardiac Enzymes: Recent Labs  Lab 02/05/17 0010 02/05/17 0631 02/05/17 1145  TROPONINI <0.03 <0.03 <0.03   BNP (last 3 results) No results for input(s): PROBNP in the last 8760 hours. HbA1C: Recent Labs    02/05/17 0010  HGBA1C 5.9*   CBG: Recent Labs  Lab 02/04/17 2240  GLUCAP 102*   Lipid Profile: No results for input(s): CHOL, HDL, LDLCALC, TRIG, CHOLHDL, LDLDIRECT in the last 72 hours. Thyroid Function Tests: Recent Labs    02/05/17 0010  TSH 1.928   Anemia Panel: No results for input(s): VITAMINB12, FOLATE, FERRITIN, TIBC, IRON, RETICCTPCT in the last 72 hours. Urine analysis:    Component Value Date/Time   COLORURINE STRAW (A) 02/04/2017 2013   APPEARANCEUR CLEAR 02/04/2017 2013   LABSPEC 1.006 02/04/2017 2013   PHURINE 6.0 02/04/2017 2013   GLUCOSEU NEGATIVE 02/04/2017 2013   HGBUR NEGATIVE 02/04/2017 2013   [place] NEGATIVE 02/04/2017 2013   [place] NEGATIVE 02/04/2017 2013   PROTEINUR NEGATIVE 02/04/2017 2013   UROBILINOGEN 0.2 11/20/2013 0630   NITRITE NEGATIVE 02/04/2017 2013   LEUKOCYTESUR MODERATE (A) 02/04/2017 2013   Sepsis Labs: @LABRCNTIP (procalcitonin:4,lacticidven:4)  )No results found for this or any previous visit (from the past 240 hour(s)).    Studies: Ct Head Wo Contrast  Result Date: 02/04/2017 CLINICAL DATA:  Syncope EXAM: CT HEAD WITHOUT CONTRAST TECHNIQUE: Contiguous axial images were obtained from the base of the skull through the vertex without intravenous contrast. COMPARISON:  Brain MRI 07/28/2016 FINDINGS: Brain: No mass lesion, intraparenchymal hemorrhage or extra-axial collection. No evidence of acute cortical infarct. Brain parenchyma and CSF-containing spaces are normal for age.  Vascular: No hyperdense vessel or unexpected calcification. Skull: Normal visualized skull base, calvarium and extracranial soft tissues. Sinuses/Orbits: No sinus fluid levels or advanced mucosal thickening. No mastoid effusion. Normal orbits. IMPRESSION: Normal head CT. Electronically Signed   By: Ulyses Jarred M.D.   On: 02/04/2017 23:30   Mr Jeri Cos TM Contrast  Result Date: 02/05/2017 CLINICAL DATA:  Initial evaluation for acute dizziness for 1 week. EXAM: MRI HEAD WITHOUT AND WITH CONTRAST TECHNIQUE: Multiplanar, multiecho pulse sequences of the brain and surrounding structures were obtained without and with intravenous contrast. CONTRAST:  99mL MULTIHANCE GADOBENATE DIMEGLUMINE 529 MG/ML IV SOLN COMPARISON:  [facility] CT from 02/04/2017 as well as previous MRI from 07/28/2016. FINDINGS: Brain: The cerebral volume within normal limits for age. Patchy T2/FLAIR hyperintensity within the periventricular white matter, nonspecific, but most like related chronic small vessel ischemic disease, felt to be within normal limits for age. No abnormal foci of restricted diffusion to suggest acute or subacute ischemia. Gray-white matter differentiation well maintained. No encephalomalacia to suggest chronic infarction. No foci of susceptibility artifact suggest acute or chronic intracranial hemorrhage. Tiny 8 mm probable right parafalcine meningioma noted along the right posterior falx (series 12, image 34). No associated edema or mass effect. No other mass lesion. No midline shift or mass effect. Ventricles normal in size without hydrocephalus. No extra-axial  fluid collection. Major dural sinuses are patent. Incidental note made of a right parietal DVA. No other abnormal enhancement. Pituitary gland suprasellar region normal. Midline structures intact and normal. Vascular: Major intracranial vascular flow voids are well maintained. Skull and upper cervical spine: Craniocervical junction normal. Upper cervical spine within  normal limits. Bone marrow signal intensity normal. No scalp soft tissue abnormality. Sinuses/Orbits: Globes and orbital soft tissues within normal limits. Patient status post lens extraction bilaterally. Left maxillary sinus retention cyst noted. Mild scattered mucosal thickening within the ethmoidal air cells and maxillary sinuses. Paranasal sinuses are otherwise clear. No mastoid effusion. Inner ear structures grossly normal. Other: None. IMPRESSION: 1. No acute intracranial abnormality. 2. Tiny 8 mm right parafalcine meningioma along the posterior falx. No associated mass effect or edema. This is felt to be incidental in nature, and likely of no clinical significance. 3. Otherwise normal brain MRI for age. Electronically Signed   By: Jeannine Boga M.D.   On: 02/05/2017 13:58    Scheduled Meds: . amLODipine  2.5 mg Oral Daily  . aspirin  325 mg Oral Daily  . cholecalciferol  2,000 Units Oral Daily  . enoxaparin (LOVENOX) injection  40 mg Subcutaneous Q24H  . famotidine  10 mg Oral BID  . hydroxyurea  500 mg Oral Q M,W,F  . levothyroxine  88 mcg Oral QAC breakfast  . sodium chloride flush  3 mL Intravenous Q12H  . topiramate  25 mg Oral Daily    Continuous Infusions: . sodium chloride    . cefTRIAXone (ROCEPHIN)  IV Stopped (02/05/17 0002)     LOS: 0 days     Alma Friendly, MD [facility]   If 7PM-7AM, please contact night-coverage [email or web address] Password [facility] 02/05/2017, 7:46 PM

## 2017-02-06 ENCOUNTER — Observation Stay (HOSPITAL_COMMUNITY): Payer: Medicare Other

## 2017-02-06 DIAGNOSIS — I444 Left anterior fascicular block: Secondary | ICD-10-CM | POA: Diagnosis present

## 2017-02-06 DIAGNOSIS — R55 Syncope and collapse: Secondary | ICD-10-CM | POA: Diagnosis not present

## 2017-02-06 DIAGNOSIS — N390 Urinary tract infection, site not specified: Secondary | ICD-10-CM | POA: Diagnosis present

## 2017-02-06 DIAGNOSIS — I2510 Atherosclerotic heart disease of native coronary artery without angina pectoris: Secondary | ICD-10-CM | POA: Diagnosis present

## 2017-02-06 DIAGNOSIS — I7300 Raynaud's syndrome without gangrene: Secondary | ICD-10-CM | POA: Diagnosis present

## 2017-02-06 DIAGNOSIS — Z8673 Personal history of transient ischemic attack (TIA), and cerebral infarction without residual deficits: Secondary | ICD-10-CM | POA: Diagnosis not present

## 2017-02-06 DIAGNOSIS — G250 Essential tremor: Secondary | ICD-10-CM | POA: Diagnosis present

## 2017-02-06 DIAGNOSIS — D473 Essential (hemorrhagic) thrombocythemia: Secondary | ICD-10-CM | POA: Diagnosis present

## 2017-02-06 DIAGNOSIS — Z7982 Long term (current) use of aspirin: Secondary | ICD-10-CM | POA: Diagnosis not present

## 2017-02-06 DIAGNOSIS — I951 Orthostatic hypotension: Secondary | ICD-10-CM | POA: Diagnosis present

## 2017-02-06 DIAGNOSIS — K219 Gastro-esophageal reflux disease without esophagitis: Secondary | ICD-10-CM | POA: Diagnosis present

## 2017-02-06 DIAGNOSIS — E785 Hyperlipidemia, unspecified: Secondary | ICD-10-CM | POA: Diagnosis present

## 2017-02-06 DIAGNOSIS — E876 Hypokalemia: Secondary | ICD-10-CM | POA: Diagnosis present

## 2017-02-06 DIAGNOSIS — R7303 Prediabetes: Secondary | ICD-10-CM | POA: Diagnosis present

## 2017-02-06 DIAGNOSIS — E039 Hypothyroidism, unspecified: Secondary | ICD-10-CM | POA: Diagnosis present

## 2017-02-06 DIAGNOSIS — Z79899 Other long term (current) drug therapy: Secondary | ICD-10-CM | POA: Diagnosis not present

## 2017-02-06 DIAGNOSIS — I159 Secondary hypertension, unspecified: Secondary | ICD-10-CM | POA: Diagnosis present

## 2017-02-06 DIAGNOSIS — I10 Essential (primary) hypertension: Secondary | ICD-10-CM | POA: Diagnosis present

## 2017-02-06 LAB — BASIC METABOLIC PANEL
Anion gap: 7 (ref 5–15)
BUN: 14 mg/dL (ref 6–20)
CHLORIDE: 108 mmol/L (ref 101–111)
CO2: 24 mmol/L (ref 22–32)
CREATININE: 0.87 mg/dL (ref 0.44–1.00)
Calcium: 8.9 mg/dL (ref 8.9–10.3)
GFR calc Af Amer: >60 mL/min (ref >60)
GFR calc non Af Amer: >60 mL/min (ref >60)
GLUCOSE: 94 mg/dL (ref 65–99)
Potassium: 3.6 mmol/L (ref 3.5–5.1)
Sodium: 139 mmol/L (ref 135–145)

## 2017-02-06 LAB — CBC WITH DIFFERENTIAL/PLATELET
Basophils Absolute: 0.1 10*3/uL (ref 0.0–0.1)
Basophils Relative: 1 %
Eosinophils Absolute: 1 10*3/uL — ABNORMAL HIGH (ref 0.0–0.7)
Eosinophils Relative: 12 %
HEMATOCRIT: 37.4 % (ref 36.0–46.0)
HEMOGLOBIN: 12 g/dL (ref 12.0–15.0)
LYMPHS ABS: 1.5 10*3/uL (ref 0.7–4.0)
Lymphocytes Relative: 17 %
MCH: 30.4 pg (ref 26.0–34.0)
MCHC: 32.1 g/dL (ref 30.0–36.0)
MCV: 94.7 fL (ref 78.0–100.0)
MONO ABS: 1 10*3/uL (ref 0.1–1.0)
MONOS PCT: 11 %
NEUTROS ABS: 5.3 10*3/uL (ref 1.7–7.7)
NEUTROS PCT: 59 %
Platelets: 464 10*3/uL — ABNORMAL HIGH (ref 150–400)
RBC: 3.95 MIL/uL (ref 3.87–5.11)
RDW: 14.3 % (ref 11.5–15.5)
WBC: 8.9 10*3/uL (ref 4.0–10.5)

## 2017-02-06 LAB — URINE CULTURE

## 2017-02-06 MED ORDER — SODIUM CHLORIDE 0.9 % IV SOLN
INTRAVENOUS | Status: DC
  Administered 2017-02-06: 13:00:00 via INTRAVENOUS

## 2017-02-06 MED ORDER — HYDRALAZINE HCL 50 MG PO TABS: 50.0000 mg | ORAL_TABLET | Freq: Three times a day (TID) | ORAL | Status: DC

## 2017-02-06 MED ORDER — HYDRALAZINE HCL 25 MG PO TABS
25.0000 mg | ORAL_TABLET | Freq: Three times a day (TID) | ORAL | Status: DC
  Administered 2017-02-06 – 2017-02-07 (×3): 25 mg via ORAL
  Filled 2017-02-06 (×3): qty 1

## 2017-02-06 NOTE — Progress Notes (Signed)
VASCULAR LAB PRELIMINARY  PRELIMINARY  PRELIMINARY  PRELIMINARY  Carotid duplex completed.    Preliminary report:  1-39% ICA plaquing. Vertebral artery flow is antegrade.   Josefita Weissmann, RVT 02/06/2017, 4:12 PM

## 2017-02-06 NOTE — Progress Notes (Signed)
Daughters Phone number is Harrel Lemon [phone]. Wants Dr to call her.

## 2017-02-06 NOTE — Evaluation (Signed)
Physical Therapy Evaluation Patient Details Name: Deanna Hill MRN: [id number] DOB: [birth year] Today's Date: 02/06/2017   History of Present Illness  76 y.o. female, with history of hypertension, hyperlipidemia, hypothyroidism, CAD, thrombocytosis, orthostatic hypotension apparently c/o dizziness for the past week.  Patient reported having unwitnessed syncope for a few minutes while seated on a chair.  The patient also reports intermittent palpitations over past couple of weeks.  Patient also reports frontal headache, but no focal neurologic deficit, although reported in the past have been numbness and tingling of her upper extremity.   Clinical Impression  Pt admitted with above diagnosis. Pt currently with functional limitations due to the deficits listed below (see PT Problem List). Pt is currently severely limited by orthostatic hypotension with positional change. Pt is currently mod I for bed mobility, supervision for transfers and unable to ambulate due to drop in BP. Orthostatic vitals can be found below.  Pt will benefit from skilled PT to increase their independence and safety with mobility to allow discharge to the venue listed below.       Follow Up Recommendations Supervision/Assistance - 24 hour;No PT follow up    Equipment Recommendations  3in1 (PT)    Recommendations for Other Services       Precautions / Restrictions Precautions Precautions: Fall Restrictions Weight Bearing Restrictions: No      Mobility  Bed Mobility Overal bed mobility: Modified Independent             General bed mobility comments: HoB elevated and use of bedrails   Transfers Overall transfer level: Needs assistance Equipment used: Rolling walker (2 wheeled) Transfers: Sit to/from Stand Sit to Stand: Supervision         General transfer comment: supervision for safety, dizziness on standing unable to obtain BP in standing due to increase in symptoms  Ambulation/Gait             General Gait Details: unable to attempt due to orthostasis      Balance Overall balance assessment: Needs assistance Sitting-balance support: Feet supported;No upper extremity supported Sitting balance-Leahy Scale: Good     Standing balance support: Bilateral upper extremity supported Standing balance-Leahy Scale: Poor Standing balance comment: requires RW for stabilization                             Pertinent Vitals/Pain Pain Assessment: 0-10 Pain Score: 5  Pain Location: headache Pain Descriptors / Indicators: Headache Pain Intervention(s): Monitored during session;Limited activity within patient's tolerance   All other vitals stable throughout  Orthostatic BPs  Supine 132/60  Sitting 103/84  Sitting after 3 min 115/62  Standing - unable to maintain for BP due to dizziness value started in standing but obtained in sitting  88/44      Home Living Family/patient expects to be discharged to:: Private residence Living Arrangements: Alone Available Help at Discharge: Family;Friend(s);Available PRN/intermittently Type of Home: House Home Access: Stairs to enter Entrance Stairs-Rails: Right Entrance Stairs-Number of Steps: 3 Home Layout: One level Home Equipment: Walker - 2 wheels;Cane - single point;Shower seat - built in;Hand held shower head;Grab bars - tub/shower      Prior Function Level of Independence: Independent         Comments: before syncopal episodes able to drive and was independent in ADLs and iADLs     Hand Dominance   Dominant Hand: Right    Extremity/Trunk Assessment   Upper Extremity Assessment Upper Extremity Assessment: Overall  WFL for tasks assessed    Lower Extremity Assessment Lower Extremity Assessment: Overall WFL for tasks assessed    Cervical / Trunk Assessment Cervical / Trunk Assessment: Kyphotic  Communication   Communication: No difficulties  Cognition Arousal/Alertness: Awake/alert Behavior During  Therapy: WFL for tasks assessed/performed Overall Cognitive Status: Within Functional Limits for tasks assessed                                        General Comments General comments (skin integrity, edema, etc.): Pt became orthostatic with standing, Pt became very tearful about how she can not do anything due to her dizziness and was not able to go to Thanksgiving dinner because she was symptomatic. Pt feels she is a burden on her daughter and has already has feelings of hopelessness this time of year due to multiple family deaths around the holidays.      Assessment/Plan    PT Assessment Patient needs continued PT services  PT Problem List Decreased activity tolerance;Decreased balance;Decreased mobility;Cardiopulmonary status limiting activity;Pain       PT Treatment Interventions Gait training;Stair training;DME instruction;Functional mobility training;Therapeutic activities;Therapeutic exercise;Balance training;Patient/family education    PT Goals (Current goals can be found in the Care Plan section)  Acute Rehab PT Goals Patient Stated Goal: feel better and be able to do things PT Goal Formulation: With patient Time For Goal Achievement: 02/20/17 Potential to Achieve Goals: Fair    Frequency Min 3X/week   Barriers to discharge Decreased caregiver support         AM-PAC PT "6 Clicks" Daily Activity  Outcome Measure Difficulty turning over in bed (including adjusting bedclothes, sheets and blankets)?: A Little Difficulty moving from lying on back to sitting on the side of the bed? : A Little Difficulty sitting down on and standing up from a chair with arms (e.g., wheelchair, bedside commode, etc,.)?: A Little Help needed moving to and from a bed to chair (including a wheelchair)?: A Little Help needed walking in hospital room?: A Lot Help needed climbing 3-5 steps with a railing? : Total 6 Click Score: 15    End of Session Equipment Utilized During  Treatment: Gait belt Activity Tolerance: Treatment limited secondary to medical complications (Comment)(becomes orhtostatic)   Nurse Communication: Mobility status PT Visit Diagnosis: Other abnormalities of gait and mobility (R26.89);Muscle weakness (generalized) (M62.81);Difficulty in walking, not elsewhere classified (R26.2);Dizziness and giddiness (R42);Pain Pain - part of body: (headache)    Time: 0981-1914 PT Time Calculation (min) (ACUTE ONLY): 47 min   Charges:   PT Evaluation $PT Eval Moderate Complexity: 1 Mod PT Treatments $Therapeutic Activity: 23-37 mins   PT G Codes:   PT G-Codes **NOT FOR INPATIENT CLASS** Functional Assessment Tool Used: AM-PAC 6 Clicks Basic Mobility Functional Limitation: Mobility: Walking and moving around Mobility: Walking and Moving Around Current Status (N8295): At least 40 percent but less than 60 percent impaired, limited or restricted Mobility: Walking and Moving Around Goal Status [phone]): At least 1 percent but less than 20 percent impaired, limited or restricted    Benjamine Mola B. Migdalia Dk PT, DPT Acute Rehabilitation  [phone] Pager [phone]    [place] 02/06/2017, 10:08 AM

## 2017-02-06 NOTE — Progress Notes (Signed)
PROGRESS NOTE  Deanna Hill BTD:[id number] DOB: 1940/09/27 DOA: 02/04/2017 PCP: Hulan Fess, MD  HPI/Recap of past 24 hours: HPI from Dr Jani Gravel on 02/04/17 Deanna Hill  is a 76 y.o. female, with history of hypertension, hyperlipidemia, hypothyroidism, CAD, thrombocytosis, orthostatic hypotension apparently c/o dizziness for the past week.  Patient reported having unwitnessed syncope for a few minutes while seated on a chair.  The patient also reports intermittent palpitations over past couple of weeks.  Patient also reports frontal headache, but no focal neurologic deficit, although reported in the past have been numbness and tingling of her upper extremity.  Patient does have a history of orthostatic hypotension, vertigo and tremors.  Patient currently denied any focal numbness, tingling, weakness, dysarthria, vision change, seizure, tongue laceration, incontinence, chest pain, shortness of breath, abdominal pain, nausea/vomiting, diarrhea/constipation, fever/chills.  Pt admitted for syncope and uncontrolled hypertension.  Today, patient reported still feeling dizzy especially when she stands up from a seated position as well as sudden movements.  Patient also appears a little bit anxious, reports she lost some people close to her in the month of November.  Patient denies any chest pain, shortness of breath, abdominal pain, nausea/vomiting, diarrhea/constipation, or upper respiratory tract symptoms, fever/chills.  Assessment/Plan: Principal Problem:   Syncope Active Problems:   Orthostatic hypotension   Hypokalemia   UTI (urinary tract infection)  #Syncope Likely due to orthostatic hypotension Patient is currently awake, alert, oriented x3 Patient noted to be orthostatic this a.m. versus worsening vertigo History of orthostatic hypotension Troponin x3 neg CT brain negative MRI negative Echo with EF of 60-65%, otherwise normal Will give 1L of NS as pt is still c/o  dizziness and is clearly orthostatic Carotid ultrasound pending PT/OT on board  #History of orthostatic hypotension Patient follows up with an outside cardiologist, has recommended compression stockings for which patient has not been able to wear, also started patient on p.o. hydralazine as needed, and stay hydrated Recommend to continue compression stockings Will give 1L of IVF as pt is still symptomatic Monitor closely  #?? UTI UA showed moderate leukocytosis, WBC 6-30 UC showed multiple species, recommend re-collection Was started on IV ceftriaxone, will continue pending results of urine culture  #HTN Uncontrolled, SBP 190s Difficult to manage as pt has also a hx of orthostatic hypotension Increased home amlodipine to 5mg  daily, started hydralazine 25 mg Q8H Monitor  #Prediabetes A1c 5.9 PCP to follow-up as an outpatient  #Hypothyroidism TSH within normal limits Continue levothyroxine  #Thrombocytosis Continue hydroxyurea Follow-up as an outpatient  #Essential tremor Ongoing, chronic condition Follow-up as an outpatient  #Raynauds Ongoing, follow-up as an outpatient    Code Status: Full  Family Communication: None at bedside  Disposition Plan: Once workup complete   Consultants:  None  Procedures:  None  Antimicrobials:  IV ceftriaxone  DVT prophylaxis: Lovenox   Objective: Vitals:   02/05/17 1828 02/05/17 2036 02/06/17 0458 02/06/17 0900  BP: (!) 177/77 (!) 197/83 (!) 181/84 132/60  Pulse: 93 93 80 83  Resp: 17 18 18 18   Temp: 98.1 F (36.7 C) 98.6 F (37 C) 97.9 F (36.6 C) 98.3 F (36.8 C)  TempSrc: Oral Oral Oral Oral  SpO2: 99% 98% 97% 98%  Weight:  70.9 kg (156 lb 4.8 oz)    Height:        Intake/Output Summary (Last 24 hours) at 02/06/2017 1255 Last data filed at 02/06/2017 1058 Gross per 24 hour  Intake 483 ml  Output 900 ml  Net -417 ml   Filed Weights   02/04/17 2359 02/05/17 2036  Weight: 70.9 kg (156 lb 4.9 oz)  70.9 kg (156 lb 4.8 oz)    Exam:   General: Awake, alert, oriented x3  Cardiovascular: S1-S2 present, no added heart sounds  Respiratory: Chest clear bilaterally  Abdomen: Soft, nontender, nondistended, bowel sounds present  Musculoskeletal: No pedal edema  Skin: Normal  Psychiatry: Appears a bit anxious  Neuro: No focal neurologic deficit noted, slight hand tremor noted especially of the left hand   Data Reviewed: CBC: Recent Labs  Lab 02/04/17 1923 02/05/17 0631 02/06/17 0353  WBC 7.7 9.2 8.9  NEUTROABS  --   --  5.3  HGB 11.7* 11.6* 12.0  HCT 37.6 36.8 37.4  MCV 95.9 94.6 94.7  PLT 480* 445* 614*   Basic Metabolic Panel: Recent Labs  Lab 02/04/17 1923 02/05/17 0631 02/06/17 0353  NA 137 141 139  K 3.4* 3.8 3.6  CL 107 110 108  CO2 23 22 24   GLUCOSE 162* 97 94  BUN 18 12 14   CREATININE 0.88 0.87 0.87  CALCIUM 8.7* 8.7* 8.9   GFR: Estimated Creatinine Clearance: 54.6 mL/min (by C-G formula based on SCr of 0.87 mg/dL). Liver Function Tests: Recent Labs  Lab 02/05/17 0631  AST 15  ALT 12*  ALKPHOS 51  BILITOT 0.8  PROT 6.5  ALBUMIN 3.8   No results for input(s): LIPASE, AMYLASE in the last 168 hours. No results for input(s): AMMONIA in the last 168 hours. Coagulation Profile: No results for input(s): INR, PROTIME in the last 168 hours. Cardiac Enzymes: Recent Labs  Lab 02/05/17 0010 02/05/17 0631 02/05/17 1145  TROPONINI <0.03 <0.03 <0.03   BNP (last 3 results) No results for input(s): PROBNP in the last 8760 hours. HbA1C: Recent Labs    02/05/17 0010  HGBA1C 5.9*   CBG: Recent Labs  Lab 02/04/17 2240  GLUCAP 102*   Lipid Profile: No results for input(s): CHOL, HDL, LDLCALC, TRIG, CHOLHDL, LDLDIRECT in the last 72 hours. Thyroid Function Tests: Recent Labs    02/05/17 0010  TSH 1.928   Anemia Panel: No results for input(s): VITAMINB12, FOLATE, FERRITIN, TIBC, IRON, RETICCTPCT in the last 72 hours. Urine analysis:      Component Value Date/Time   COLORURINE STRAW (A) 02/04/2017 2013   APPEARANCEUR CLEAR 02/04/2017 2013   LABSPEC 1.006 02/04/2017 2013   PHURINE 6.0 02/04/2017 2013   GLUCOSEU NEGATIVE 02/04/2017 2013   HGBUR NEGATIVE 02/04/2017 2013   BILIRUBINUR NEGATIVE 02/04/2017 2013   KETONESUR NEGATIVE 02/04/2017 2013   PROTEINUR NEGATIVE 02/04/2017 2013   UROBILINOGEN 0.2 11/20/2013 0630   NITRITE NEGATIVE 02/04/2017 2013   LEUKOCYTESUR MODERATE (A) 02/04/2017 2013   Sepsis Labs: @LABRCNTIP (procalcitonin:4,lacticidven:4)  ) Recent Results (from the past 240 hour(s))  Urine culture     Status: Abnormal   Collection Time: 02/04/17  8:39 PM  Result Value Ref Range Status   Specimen Description URINE, RANDOM  Final   Special Requests NONE  Final   Culture MULTIPLE SPECIES PRESENT, SUGGEST RECOLLECTION (A)  Final   Report Status 02/06/2017 FINAL  Final      Studies: Mr Jeri Cos ER Contrast  Result Date: 02/05/2017 CLINICAL DATA:  Initial evaluation for acute dizziness for 1 week. EXAM: MRI HEAD WITHOUT AND WITH CONTRAST TECHNIQUE: Multiplanar, multiecho pulse sequences of the brain and surrounding structures were obtained without and with intravenous contrast. CONTRAST:  69mL MULTIHANCE GADOBENATE DIMEGLUMINE 529 MG/ML IV SOLN COMPARISON:  [facility]  CT from 02/04/2017 as well as previous MRI from 07/28/2016. FINDINGS: Brain: The cerebral volume within normal limits for age. Patchy T2/FLAIR hyperintensity within the periventricular white matter, nonspecific, but most like related chronic small vessel ischemic disease, felt to be within normal limits for age. No abnormal foci of restricted diffusion to suggest acute or subacute ischemia. Gray-white matter differentiation well maintained. No encephalomalacia to suggest chronic infarction. No foci of susceptibility artifact suggest acute or chronic intracranial hemorrhage. Tiny 8 mm probable right parafalcine meningioma noted along the right posterior  falx (series 12, image 34). No associated edema or mass effect. No other mass lesion. No midline shift or mass effect. Ventricles normal in size without hydrocephalus. No extra-axial fluid collection. Major dural sinuses are patent. Incidental note made of a right parietal DVA. No other abnormal enhancement. Pituitary gland suprasellar region normal. Midline structures intact and normal. Vascular: Major intracranial vascular flow voids are well maintained. Skull and upper cervical spine: Craniocervical junction normal. Upper cervical spine within normal limits. Bone marrow signal intensity normal. No scalp soft tissue abnormality. Sinuses/Orbits: Globes and orbital soft tissues within normal limits. Patient status post lens extraction bilaterally. Left maxillary sinus retention cyst noted. Mild scattered mucosal thickening within the ethmoidal air cells and maxillary sinuses. Paranasal sinuses are otherwise clear. No mastoid effusion. Inner ear structures grossly normal. Other: None. IMPRESSION: 1. No acute intracranial abnormality. 2. Tiny 8 mm right parafalcine meningioma along the posterior falx. No associated mass effect or edema. This is felt to be incidental in nature, and likely of no clinical significance. 3. Otherwise normal brain MRI for age. Electronically Signed   By: Jeannine Boga M.D.   On: 02/05/2017 13:58    Scheduled Meds: . amLODipine  5 mg Oral Daily  . aspirin  325 mg Oral Daily  . cholecalciferol  2,000 Units Oral Daily  . enoxaparin (LOVENOX) injection  40 mg Subcutaneous Q24H  . famotidine  10 mg Oral BID  . hydrALAZINE  50 mg Oral Q8H  . hydroxyurea  500 mg Oral Q M,W,F  . levothyroxine  88 mcg Oral QAC breakfast  . sodium chloride flush  3 mL Intravenous Q12H  . topiramate  25 mg Oral Daily    Continuous Infusions: . sodium chloride    . cefTRIAXone (ROCEPHIN)  IV 1 g (02/05/17 2136)     LOS: 0 days     Alma Friendly, MD [facility]   If  7PM-7AM, please contact night-coverage [email or web address] Password [facility] 02/06/2017, 12:55 PM

## 2017-02-06 NOTE — Progress Notes (Signed)
  Physical Therapy -Progress Note Orthostatic BPs  Supine 132/60  Sitting 103/84  Sitting after 3 min 115/62  Standing - unable to maintain for BP due to dizziness value started in standing but obtained in sitting  88/44     Deanna Hill B. Migdalia Dk PT, DPT Acute Rehabilitation  [phone] Pager [phone]

## 2017-02-06 NOTE — Evaluation (Addendum)
Occupational Therapy Evaluation Patient Details Name: Deanna Hill MRN: [id number] DOB: [birth year] Today's Date: 02/06/2017    History of Present Illness 76 y.o. female, with history of hypertension, hyperlipidemia, hypothyroidism, CAD, thrombocytosis, orthostatic hypotension apparently c/o dizziness for the past week.  Patient reported having unwitnessed syncope for a few minutes while seated on a chair.  The patient also reports intermittent palpitations over past couple of weeks.  Patient also reports frontal headache, but no focal neurologic deficit, although reported in the past have been numbness and tingling of her upper extremity.    Clinical Impression   Pt admitted with above diagnosis.  Pt is independent PTA and lives alone.  Pt up in chair upon OT arrival (had just finished working with PT) and declining mobility out of chair.  Pt currently limited by dizziness due to orthostatic changes.  Anticipate she will be moving well once BP is stabilized.  Will continue to follow acutely in order to address deficits listed below.    Follow Up Recommendations  No OT follow up;Supervision/Assistance - 24 hour    Equipment Recommendations  3 in 1 bedside commode    Recommendations for Other Services       Precautions / Restrictions Precautions Precautions: Fall Restrictions Weight Bearing Restrictions: No      Mobility Bed Mobility Overal bed mobility: (pt up in chair)               Transfers              Balance                         ADL either performed or assessed with clinical judgement   ADL Overall ADL's : Needs assistance/impaired Eating/Feeding: Independent   Grooming: Set up;Sitting;Wash/dry hands;Wash/dry face;Brushing hair   Upper Body Bathing: Set up;Sitting   Lower Body Bathing: Set up;Sitting/lateral leans   Upper Body Dressing : Set up;Sitting   Lower Body Dressing: Set up;Sitting/lateral leans Lower Body Dressing  Details (indicate cue type and reason): Independently donning/doffing socks.               General ADL Comments: Pt deferring mobility out of chair as she had recently worked with PT.  Currently reports some "Dizziness" with head turns. BP in sitting 125/65.  Therapist educated pt on energy conservation techniques/strateiges- use of 3n1 at home espeically since she gets up frequently at night to void, completing ADLs/IADLs in seated position as needed and utilize seated rest breaks as needed.     Vision         Perception     Praxis      Pertinent Vitals/Pain Pain Assessment: 0-10 Pain Score: 3  Pain Location: headache Pain Descriptors / Indicators: Headache Pain Intervention(s): Monitored during session     Hand Dominance Right   Extremity/Trunk Assessment Upper Extremity Assessment Upper Extremity Assessment: Overall WFL for tasks assessed   Lower Extremity Assessment Lower Extremity Assessment: Defer to PT evaluation       Communication Communication Communication: No difficulties   Cognition Arousal/Alertness: Awake/alert Behavior During Therapy: WFL for tasks assessed/performed Overall Cognitive Status: Within Functional Limits for tasks assessed                                     General Comments     Exercises     Shoulder Instructions  Home Living Family/patient expects to be discharged to:: Private residence Living Arrangements: Alone Available Help at Discharge: Family;Friend(s);Available PRN/intermittently Type of Home: House Home Access: Stairs to enter [employer] of Steps: 3 Entrance Stairs-Rails: Right Home Layout: One level     Bathroom Shower/Tub: Occupational psychologist: Handicapped height Bathroom Accessibility: Yes   Home Equipment: Environmental consultant - 2 wheels;Cane - single point;Shower seat - built in;Hand held shower head;Grab bars - tub/shower   Additional Comments: Lives alone, family nearby  but unable to help full time.      Prior Functioning/Environment Level of Independence: Independent        Comments: before syncopal episodes able to drive and was independent in ADLs and iADLs        OT Problem List: Decreased strength;Decreased activity tolerance;Decreased knowledge of use of DME or AE;Impaired balance (sitting and/or standing)      OT Treatment/Interventions: Self-care/ADL training;Energy conservation;DME and/or AE instruction;Therapeutic activities;Patient/family education;Balance training    OT Goals(Current goals can be found in the care plan section) Acute Rehab OT Goals Patient Stated Goal: feel better and be able to do things OT Goal Formulation: With patient Time For Goal Achievement: 02/20/17 Potential to Achieve Goals: Good ADL Goals Pt Will Transfer to Toilet: with modified independence;ambulating Additional ADL Goal #1: Pt will be able to complete UB/LB bathing and dressing tasks at mod I level.  OT Frequency: Min 2X/week   Barriers to D/C:            Co-evaluation              AM-PAC PT "6 Clicks" Daily Activity     Outcome Measure Help from another person eating meals?: None Help from another person taking care of personal grooming?: None Help from another person toileting, which includes using toliet, bedpan, or urinal?: A Little Help from another person bathing (including washing, rinsing, drying)?: A Little Help from another person to put on and taking off regular upper body clothing?: None Help from another person to put on and taking off regular lower body clothing?: A Little 6 Click Score: 21   End of Session    Activity Tolerance: Patient tolerated treatment well Patient left: in chair;with call bell/phone within reach  OT Visit Diagnosis: Unsteadiness on feet (R26.81);Dizziness and giddiness (R42)                Time: 3545-6256 OT Time Calculation (min): 15 min Charges:  OT General Charges $OT Visit: 1 Visit OT  Evaluation $OT Eval Low Complexity: 1 Low G-Codes: OT G-codes **NOT FOR INPATIENT CLASS** Functional Assessment Tool Used: Clinical judgement Functional Limitation: Self care Self Care Current Status (L8937): At least 1 percent but less than 20 percent impaired, limited or restricted Self Care Goal Status (D4287): 0 percent impaired, limited or restricted    Darrol Jump OTR/L 02/06/2017, 1:31 PM

## 2017-02-07 LAB — BASIC METABOLIC PANEL
ANION GAP: 6 (ref 5–15)
BUN: 21 mg/dL — ABNORMAL HIGH (ref 6–20)
CO2: 25 mmol/L (ref 22–32)
Calcium: 8.6 mg/dL — ABNORMAL LOW (ref 8.9–10.3)
Chloride: 106 mmol/L (ref 101–111)
Creatinine, Ser: 1.01 mg/dL — ABNORMAL HIGH (ref 0.44–1.00)
GFR, EST NON AFRICAN AMERICAN: 52 mL/min — AB (ref >60)
GLUCOSE: 101 mg/dL — AB (ref 65–99)
Potassium: 3.7 mmol/L (ref 3.5–5.1)
Sodium: 137 mmol/L (ref 135–145)

## 2017-02-07 LAB — CBC WITH DIFFERENTIAL/PLATELET
BASOS ABS: 0.1 10*3/uL (ref 0.0–0.1)
BASOS PCT: 1 %
Eosinophils Absolute: 1.1 10*3/uL — ABNORMAL HIGH (ref 0.0–0.7)
Eosinophils Relative: 14 %
HEMATOCRIT: 38.1 % (ref 36.0–46.0)
HEMOGLOBIN: 11.8 g/dL — AB (ref 12.0–15.0)
LYMPHS PCT: 13 %
Lymphs Abs: 1 10*3/uL (ref 0.7–4.0)
MCH: 29.4 pg (ref 26.0–34.0)
MCHC: 31 g/dL (ref 30.0–36.0)
MCV: 94.8 fL (ref 78.0–100.0)
MONOS PCT: 10 %
Monocytes Absolute: 0.8 10*3/uL (ref 0.1–1.0)
NEUTROS ABS: 5 10*3/uL (ref 1.7–7.7)
NEUTROS PCT: 62 %
Platelets: 440 10*3/uL — ABNORMAL HIGH (ref 150–400)
RBC: 4.02 MIL/uL (ref 3.87–5.11)
RDW: 14 % (ref 11.5–15.5)
WBC: 8 10*3/uL (ref 4.0–10.5)

## 2017-02-07 MED ORDER — AMLODIPINE BESYLATE 5 MG PO TABS: 5.0000 mg | ORAL_TABLET | Freq: Every day | ORAL | Status: DC

## 2017-02-07 NOTE — Discharge Summary (Signed)
Discharge Summary  Deanna Hill EUM:[id number] DOB: 11/12/1940  PCP: Hulan Fess, MD  Admit date: 02/04/2017 Discharge date: 02/07/2017  Time spent: >54mins  Recommendations for Outpatient Follow-up:  1. PCP 2. Cardiology  Discharge Diagnoses:  Active Hospital Problems   Diagnosis Date Noted  . Syncope 02/04/2017  . Hypokalemia 02/04/2017  . UTI (urinary tract infection) 02/04/2017  . Orthostatic hypotension 05/22/2016    Resolved Hospital Problems  No resolved problems to display.    Discharge Condition: Stable  Diet recommendation: Heart healthy  Vitals:   02/07/17 0436 02/07/17 0936  BP: (!) 164/68 (!) 132/58  Pulse: 82 86  Resp: 16 18  Temp: 98 F (36.7 C) (!) 97.5 F (36.4 C)  SpO2: 97% 100%    History of present illness:  PatriciaHordykis a76 y.o.female,with history of hypertension, hyperlipidemia, hypothyroidism, CAD, thrombocytosis, orthostatic hypotension apparently c/odizziness for the past week.  Patient reported having unwitnessed syncope for a few minutes while seated on a chair.  The patient also reports intermittent palpitations over past couple of weeks.  Patient also reports frontal headache, but no focal neurologic deficit, although reported in the past have been numbness and tingling of her upper extremity.  Patient does have a history of orthostatic hypotension, vertigo and tremors.  Patient currently denied any focal numbness, tingling, weakness, dysarthria, vision change, seizure, tongue laceration, incontinence, chest pain, shortness of breath, abdominal pain, nausea/vomiting, diarrhea/constipation, fever/chills.  Pt admitted for syncope and uncontrolled hypertension.  Today, patient reported feeling much better, feels less dizzy and has been able to ambulate back and forth to the bathroom and back. Patient denies any chest pain, shortness of breath, abdominal pain, nausea/vomiting, diarrhea/constipation, or upper respiratory tract  symptoms, fever/chills. Patient stable for discharge.  Hospital Course:  Principal Problem:   Syncope Active Problems:   Orthostatic hypotension   Hypokalemia   UTI (urinary tract infection)  #Syncope Likely due to orthostatic hypotension Patient is currently awake, alert, oriented x3 History of orthostatic hypotension Troponin x3 neg CT brain negative MRI negative Echo with EF of 60-65%, otherwise normal Carotid ultrasound normal PT/OT, provider patient with all necessary information  #History of orthostatic hypotension -Patient follows up with an outside cardiologist, has recommended compression stockings for which patient has not been able to wear, also started patient on p.o. hydralazine as needed, and rec pt to stay hydrated -Reinforced that pt follow up with her cardiologist for a possible tilt-table test, advised to wear the compression stockings and stay very hydrated S/P IVF Can consider outpt endocrinologist for further investigation/workup  #?? UTI UA showed moderate leukocytosis, WBC 6-30 UC showed multiple species, recommend re-collection Was started on IV ceftriaxone, for 4 days total -Repeat UC pending, PCP please follow up on result  #HTN Uncontrolled, SBP 190s->200s Difficult to manage as pt has also a hx of orthostatic hypotension Increased home amlodipine to 5mg  daily, continue prn hydralazine 25mg   PCP to follow up and monitor  #Prediabetes A1c 5.9 PCP to follow-up as an outpatient  #Hypothyroidism TSH within normal limits Continue levothyroxine  #Thrombocytosis Continue hydroxyurea Follow-up as an outpatient  #Essential tremor Ongoing, chronic condition, work up for parkinson negative Follow-up outpatient neurologist  #Raynauds Ongoing, follow-up as an outpatient  #Pt appeared depressed and overwhelmed with her medical condition and some deaths in the family. PT advised to talk with her PCP for possible low-dose antidepressant.  Discussed with patient, no SI/HI.    Procedures:  None  Consultations:  None   Discharge Exam: BP (!) 132/58 (  BP Location: Left Arm)   Pulse 86   Temp (!) 97.5 F (36.4 C) (Oral)   Resp 18   Ht 5\' 8"  (1.727 m)   Wt 70.9 kg (156 lb 4.8 oz)   SpO2 100%   BMI 23.77 kg/m   General: Alert, awake,oreiented X3  Cardiovascular: S1-S2 present, no added hrt sounds Respiratory: Chest clear bilaterally Neuro: No focal neurologic deficit  Discharge Instructions You were cared for by a hospitalist during your hospital stay. If you have any questions about your discharge medications or the care you received while you were in the hospital after you are discharged, you can call the unit and asked to speak with the hospitalist on call if the hospitalist that took care of you is not available. Once you are discharged, your primary care physician will handle any further medical issues. Please note that NO REFILLS for any discharge medications will be authorized once you are discharged, as it is imperative that you return to your primary care physician (or establish a relationship with a primary care physician if you do not have one) for your aftercare needs so that they can reassess your need for medications and monitor your lab values.  Discharge Instructions    Diet - low sodium heart healthy   Complete by:  As directed    Increase activity slowly   Complete by:  As directed      Allergies as of 02/07/2017      Reactions   Iodine Shortness Of Breath, Other (See Comments)   Pt states "Deadly"   Prednisone    Facial flushing and swelling   Shellfish Allergy Anaphylaxis, Other (See Comments)   " I almost died once"      Medication List    TAKE these medications   acetaminophen 325 MG tablet Commonly known as:  TYLENOL Take 650 mg by mouth daily as needed for mild pain.   ALPRAZolam 0.25 MG tablet Commonly known as:  XANAX Take 0.5 tablets (0.125 mg total) by mouth 3 (three) times  daily as needed for anxiety.   amLODipine 5 MG tablet Commonly known as:  NORVASC Take 1 tablet (5 mg total) by mouth daily. What changed:  how much to take   aspirin 325 MG tablet Take 325 mg by mouth daily.   BIOTIN PO Take 1 capsule by mouth daily.   Evening Primrose Oil 500 MG Caps Take 1 capsule by mouth daily.   hydrALAZINE 25 MG tablet Commonly known as:  APRESOLINE Take 1 tablet (25 mg total) daily as needed by mouth. If blood pressure over 983 systolic,take in the evening.   hydroxyurea 500 MG capsule Commonly known as:  HYDREA Take 1 capsule (500 mg total) by mouth every Monday, Wednesday, and Friday.   levothyroxine 88 MCG tablet Commonly known as:  SYNTHROID, LEVOTHROID Take 88 mcg daily by mouth.   metroNIDAZOLE 0.75 % cream Commonly known as:  METROCREAM Apply 1 application topically daily as needed (for rosacea).   nitroGLYCERIN 0.4 MG SL tablet Commonly known as:  NITROSTAT Place 0.4 mg under the tongue every 5 (five) minutes as needed for chest pain.   ranitidine 150 MG capsule Commonly known as:  ZANTAC Take 150 mg by mouth 2 (two) times daily.   topiramate 25 MG tablet Commonly known as:  TOPAMAX Take 25 mg by mouth daily.   Vitamin D 2000 units Caps Take 1 capsule by mouth daily.      Allergies  Allergen Reactions  .  Iodine Shortness Of Breath and Other (See Comments)    Pt states "Deadly"  . Prednisone     Facial flushing and swelling  . Shellfish Allergy Anaphylaxis and Other (See Comments)    " I almost died once"    Follow-up Information    Little, Lennette Bihari, MD. Schedule an appointment as soon as possible for a visit in 1 week(s).   Specialty:  Family Medicine Contact information: [place] [address] [phone]        Leonie Man, MD. Go in 2 week(s).   Specialty:  Cardiology Contact information: [address] [place] [address] [phone]            The results of  significant diagnostics from this hospitalization (including imaging, microbiology, ancillary and laboratory) are listed below for reference.    Significant Diagnostic Studies: Ct Head Wo Contrast  Result Date: 02/04/2017 CLINICAL DATA:  Syncope EXAM: CT HEAD WITHOUT CONTRAST TECHNIQUE: Contiguous axial images were obtained from the base of the skull through the vertex without intravenous contrast. COMPARISON:  Brain MRI 07/28/2016 FINDINGS: Brain: No mass lesion, intraparenchymal hemorrhage or extra-axial collection. No evidence of acute cortical infarct. Brain parenchyma and CSF-containing spaces are normal for age. Vascular: No hyperdense vessel or unexpected calcification. Skull: Normal visualized skull base, calvarium and extracranial soft tissues. Sinuses/Orbits: No sinus fluid levels or advanced mucosal thickening. No mastoid effusion. Normal orbits. IMPRESSION: Normal head CT. Electronically Signed   By: Ulyses Jarred M.D.   On: 02/04/2017 23:30   Mr Jeri Cos TM Contrast  Result Date: 02/05/2017 CLINICAL DATA:  Initial evaluation for acute dizziness for 1 week. EXAM: MRI HEAD WITHOUT AND WITH CONTRAST TECHNIQUE: Multiplanar, multiecho pulse sequences of the brain and surrounding structures were obtained without and with intravenous contrast. CONTRAST:  63mL MULTIHANCE GADOBENATE DIMEGLUMINE 529 MG/ML IV SOLN COMPARISON:  Priors CT from 02/04/2017 as well as previous MRI from 07/28/2016. FINDINGS: Brain: The cerebral volume within normal limits for age. Patchy T2/FLAIR hyperintensity within the periventricular white matter, nonspecific, but most like related chronic small vessel ischemic disease, felt to be within normal limits for age. No abnormal foci of restricted diffusion to suggest acute or subacute ischemia. Gray-white matter differentiation well maintained. No encephalomalacia to suggest chronic infarction. No foci of susceptibility artifact suggest acute or chronic intracranial hemorrhage.  Tiny 8 mm probable right parafalcine meningioma noted along the right posterior falx (series 12, image 34). No associated edema or mass effect. No other mass lesion. No midline shift or mass effect. Ventricles normal in size without hydrocephalus. No extra-axial fluid collection. Major dural sinuses are patent. Incidental note made of a right parietal DVA. No other abnormal enhancement. Pituitary gland suprasellar region normal. Midline structures intact and normal. Vascular: Major intracranial vascular flow voids are well maintained. Skull and upper cervical spine: Craniocervical junction normal. Upper cervical spine within normal limits. Bone marrow signal intensity normal. No scalp soft tissue abnormality. Sinuses/Orbits: Globes and orbital soft tissues within normal limits. Patient status post lens extraction bilaterally. Left maxillary sinus retention cyst noted. Mild scattered mucosal thickening within the ethmoidal air cells and maxillary sinuses. Paranasal sinuses are otherwise clear. No mastoid effusion. Inner ear structures grossly normal. Other: None. IMPRESSION: 1. No acute intracranial abnormality. 2. Tiny 8 mm right parafalcine meningioma along the posterior falx. No associated mass effect or edema. This is felt to be incidental in nature, and likely of no clinical significance. 3. Otherwise normal brain MRI for age.  Electronically Signed   By: Jeannine Boga M.D.   On: 02/05/2017 13:58    Microbiology: Recent Results (from the past 240 hour(s))  Urine culture     Status: Abnormal   Collection Time: 02/04/17  8:39 PM  Result Value Ref Range Status   Specimen Description URINE, RANDOM  Final   Special Requests NONE  Final   Culture MULTIPLE SPECIES PRESENT, SUGGEST RECOLLECTION (A)  Final   Report Status 02/06/2017 FINAL  Final     Labs: Basic Metabolic Panel: Recent Labs  Lab 02/04/17 1923 02/05/17 0631 02/06/17 0353 02/07/17 0640  NA 137 141 139 137  K 3.4* 3.8 3.6 3.7    CL 107 110 108 106  CO2 23 22 24 25   GLUCOSE 162* 97 94 101*  BUN 18 12 14  21*  CREATININE 0.88 0.87 0.87 1.01*  CALCIUM 8.7* 8.7* 8.9 8.6*   Liver Function Tests: Recent Labs  Lab 02/05/17 0631  AST 15  ALT 12*  ALKPHOS 51  BILITOT 0.8  PROT 6.5  ALBUMIN 3.8   No results for input(s): LIPASE, AMYLASE in the last 168 hours. No results for input(s): AMMONIA in the last 168 hours. CBC: Recent Labs  Lab 02/04/17 1923 02/05/17 0631 02/06/17 0353 02/07/17 0640  WBC 7.7 9.2 8.9 8.0  NEUTROABS  --   --  5.3 5.0  HGB 11.7* 11.6* 12.0 11.8*  HCT 37.6 36.8 37.4 38.1  MCV 95.9 94.6 94.7 94.8  PLT 480* 445* 464* 440*   Cardiac Enzymes: Recent Labs  Lab 02/05/17 0010 02/05/17 0631 02/05/17 1145  TROPONINI <0.03 <0.03 <0.03   BNP: BNP (last 3 results) No results for input(s): BNP in the last 8760 hours.  ProBNP (last 3 results) No results for input(s): PROBNP in the last 8760 hours.  CBG: Recent Labs  Lab 02/04/17 2240  GLUCAP 102*       Signed:  Alma Friendly, MD [facility] 02/07/2017, 7:34 PM

## 2017-02-07 NOTE — Progress Notes (Signed)
Rocky Link to be D/C'd Home per MD order.  Discussed prescriptions and follow up appointments with the patient. Prescriptions given to patient, medication list explained in detail. Pt verbalized understanding.  Allergies as of 02/07/2017      Reactions   Iodine Shortness Of Breath, Other (See Comments)   Pt states "Deadly"   Prednisone    Facial flushing and swelling   Shellfish Allergy Anaphylaxis, Other (See Comments)   " I almost died once"      Medication List    TAKE these medications   acetaminophen 325 MG tablet Commonly known as:  TYLENOL Take 650 mg by mouth daily as needed for mild pain.   ALPRAZolam 0.25 MG tablet Commonly known as:  XANAX Take 0.5 tablets (0.125 mg total) by mouth 3 (three) times daily as needed for anxiety.   amLODipine 5 MG tablet Commonly known as:  NORVASC Take 1 tablet (5 mg total) by mouth daily. What changed:  how much to take   aspirin 325 MG tablet Take 325 mg by mouth daily.   BIOTIN PO Take 1 capsule by mouth daily.   Evening Primrose Oil 500 MG Caps Take 1 capsule by mouth daily.   hydrALAZINE 25 MG tablet Commonly known as:  APRESOLINE Take 1 tablet (25 mg total) daily as needed by mouth. If blood pressure over 563 systolic,take in the evening.   hydroxyurea 500 MG capsule Commonly known as:  HYDREA Take 1 capsule (500 mg total) by mouth every Monday, Wednesday, and Friday.   levothyroxine 88 MCG tablet Commonly known as:  SYNTHROID, LEVOTHROID Take 88 mcg daily by mouth.   metroNIDAZOLE 0.75 % cream Commonly known as:  METROCREAM Apply 1 application topically daily as needed (for rosacea).   nitroGLYCERIN 0.4 MG SL tablet Commonly known as:  NITROSTAT Place 0.4 mg under the tongue every 5 (five) minutes as needed for chest pain.   ranitidine 150 MG capsule Commonly known as:  ZANTAC Take 150 mg by mouth 2 (two) times daily.   topiramate 25 MG tablet Commonly known as:  TOPAMAX Take 25 mg by mouth daily.   Vitamin D 2000 units Caps Take 1 capsule by mouth daily.       Vitals:   02/07/17 0436 02/07/17 0936  BP: (!) 164/68 (!) 132/58  Pulse: 82 86  Resp: 16 18  Temp: 98 F (36.7 C) (!) 97.5 F (36.4 C)  SpO2: 97% 100%    Skin clean, dry and intact without evidence of skin break down, no evidence of skin tears noted. IV catheter discontinued intact. Site without signs and symptoms of complications. Dressing and pressure applied. Pt denies pain at this time. No complaints noted.  An After Visit Summary was printed and given to the patient. Patient escorted via [place], and D/C home via private auto.  Haywood Lasso BSN, RN [facility] Phone [phone]

## 2017-02-08 LAB — URINE CULTURE

## 2017-02-14 ENCOUNTER — Other Ambulatory Visit: Payer: Self-pay | Admitting: Neurology

## 2017-03-01 ENCOUNTER — Ambulatory Visit: Payer: Medicare Other | Admitting: Cardiology

## 2017-03-01 ENCOUNTER — Encounter: Payer: Self-pay | Admitting: Cardiology

## 2017-03-01 VITALS — BP 146/72 | HR 80 | Ht 68.0 in | Wt 152.0 lb

## 2017-03-01 DIAGNOSIS — I10 Essential (primary) hypertension: Secondary | ICD-10-CM | POA: Diagnosis not present

## 2017-03-01 DIAGNOSIS — G903 Multi-system degeneration of the autonomic nervous system: Secondary | ICD-10-CM

## 2017-03-01 DIAGNOSIS — R42 Dizziness and giddiness: Secondary | ICD-10-CM

## 2017-03-01 DIAGNOSIS — R262 Difficulty in walking, not elsewhere classified: Secondary | ICD-10-CM

## 2017-03-01 DIAGNOSIS — G250 Essential tremor: Secondary | ICD-10-CM | POA: Diagnosis not present

## 2017-03-01 DIAGNOSIS — R55 Syncope and collapse: Secondary | ICD-10-CM | POA: Diagnosis not present

## 2017-03-01 MED ORDER — MECLIZINE HCL 25 MG PO TABS: 25.0000 mg | ORAL_TABLET | Freq: Three times a day (TID) | ORAL | 0 refills | Status: DC | PRN

## 2017-03-01 NOTE — Progress Notes (Signed)
PCP: Hulan Fess, MD [facility] Neurology: Dr. Jannifer Franklin Hematology-Oncology Dr. Lindi Adie She has been seen by both Dr. Wynonia Lawman and Dr. Einar Gip for cardiology --now referred for her third cardiologist he had an echo  Clinic Note: Chief Complaint  Patient presents with  . Follow-up    1 monts -labile hypertension with neurogenic orthostatic hypotension  . Hospitalization Follow-up    Syncope  . Nausea    Every morning.  . Sinusitis    HPI: Deanna Hill is a 76 y.o. female who is being seen today for follow-up evaluation of orthostatic hypotension in setting of hypertension at the request of Hulan Fess, MD.  --> Initially seen as a 3rd opinion.  Now being seen post-hospitalization for Syncope.   She has a history of hypertension, hypothyroidism, thrombocytosis as well as essential tremor and mixed dyslipidemia.    In February 2018, she actually was admitted for Posterior Reversible Encephalopathic Syndrome (PRES) with significant hypertension (hypertensive crisis) and blood pressure in the 200 range.    She had severe headaches at that time.  She was also started on Topamax at that time for headaches   She has noted significant swings in BP ranging from 90s/60s --> 220/120 mmhg: none as high as during her PRES episode.  She has also noted vertigo that can be debilitating since her PRES episode.    Deanna Hill was first seen here on 11/14 (just 2 days prior to her hospitalization). Was on 2.5 mg of Amlodipine - with significant BP swings - did not like the BP increase from Midodrine & was therefore similarly concerned about converting to Fluorinef.  -> I recommended Hydration, support stockings & PRN Hydralazine for SBP > 180 mmhg (has not needed).  Recent Hospitalizations:  01/29/2017 - Syncope. In setting of UTI - thought to be related to her underlying Labile BP / Orthostatic Hypotension & dehydration -- treated with IVF & Abx.  Thankfully, she fainted  while seated - so no fall/injury.  -> Amlodipine dose increased to 5 mg.  Studies Personally Reviewed - (if available, images/films reviewed: From Epic Chart or Care Everywhere)  Transthoracic echo November 2018: Moderate LVH.  EF 60-65%.  Aortic sclerosis.  No stenosis.  Diastolic parameters noted as "normal "however they are pseudo-normal.  Carotid Dopplers, November 2018: Normal carotid arteries, vertebral arteries and subclavian arteries.   Interval History: Deanna Hill returns today for scheduled follow-up but also as part of her hospital follow-up.  She is quite nervous today and her tremor seems to be quite pronounced.  She indicates that in addition to having her passout spell going to the hospital, she has had several episodes where she has had loss of balance and dizziness where she is falling back onto her bed, but she has not had any injuries.  No real falls per se.  She notes that since being on the higher dose of amlodipine, her spikes of blood pressures are not that high.  She brings with her blood pressure log which usually shows lowish blood pressures in the morning down as low as 132 systolic, but as high as the 170s in the evenings.  She truly has supine hypertension as noted with some her blood pressures lying down being the ones that are usually in the 170-180 higher pressures to the point where she has had to use the hydralazine. She is just extremely fearful of having another passout spell.  She is noticing that her vertigo is also getting worse, and indicates  that the balance exercises that she was told to do actually make her feel worse.  These all seem to have occurred since her PRES.  She also notes that she has had headaches off & on.  She has not had issues with rapid irregular heartbeats or palpitations.  She has not had any significant dyspnea with exertion.  No chest tightness or pressure with a rest or exertion. No PND, orthopnea or edema.  Since her hospitalization no  further syncopal episodes.  He has however felt somewhat near syncopal on occasion. No claudication she does now use a cane or walker..  ROS: A comprehensive was performed. Review of Systems  Constitutional: Positive for malaise/fatigue (Not a new finding).  HENT: Negative for nosebleeds.   Eyes: Positive for blurred vision (With elevated blood pressure).  Respiratory: Negative for shortness of breath (Only when blood pressure is very high).   Musculoskeletal: Negative for joint pain.  Neurological: Positive for tremors (Baseline essential tremor mostly on the left side) and headaches (With elevated blood pressure).  Psychiatric/Behavioral: Positive for depression (This month and lasts are particularly bad because she is coming up on the anniversary of her son's death.  She is also lost her husband and several other family members during this month. ).       She is compounding her grief and sadness from the loss of her family members with her being extremely upset and frustrated by fear of doing any activity because she is scared of passing out.  All other systems reviewed and are negative.  I have reviewed and (if needed) personally updated the patient's problem list, medications, allergies, past medical and surgical history, social and family history.   Past Medical History:  Diagnosis Date  . Anxiety   . Coronary artery disease, non-occlusive 05/2013   Cath for Abn Myoview => mild CAD, 30% pCx. Otw normal Coronaries.  Normlal EF.  . Essential tremor 06/19/2015  . GERD (gastroesophageal reflux disease)   . Hyperlipidemia   . Hypertension   . Hypothyroidism   . IBS (irritable bowel syndrome)   . Orthostatic hypotension 05/22/2016  . Senile calcific aortic valve sclerosis 04/2016   2D echo showed aortic sclerosis but no stenosis.  Normal wall motion.  GR 2 DD  . Thrombocytosis ([place]) 03/11/2011    Initial Cardiology consultation with Dr. Einar Gip (seen by his NP -Donette Larry, FNP-C) September 03, 2016: -Consult was for murmur.  Because of murmur was not indicated however it is clearly aortic sclerosis based on echocardiogram.  The orthostatic hypotension and previous PRES was discussed.  Recommended taking amlodipine in the evening.  Also advised sleeping on a wedge pillow to avoid supine hypertension.  Recommended thigh-high compression stockings (patient did not use) was started on low-dose Midodrine 5 mg as needed (patient did not use)  On follow-up visit, patient did not want to either wear support stockings or take midodrine. .  Noted that she was very emotional about the loss of her husband.  Noted that she walk with a cane. -->  Last seen on November 11, 2016:  Patient was very reluctant again to use midodrine despite lengthy discussion for fall risk.  Again recommended thigh-high support stockings.  Recommendation was PRN follow-up  Past Surgical History:  Procedure Laterality Date  . ABDOMINAL HYSTERECTOMY  1979  . CATARACT EXTRACTION Bilateral 2015   Dr. Herbert Deaner  . CHOLECYSTECTOMY, LAPAROSCOPIC  2001  . LEFT HEART CATHETERIZATION WITH CORONARY ANGIOGRAM N/A 06/08/2013   Procedure: LEFT HEART  CATHETERIZATION WITH CORONARY ANGIOGRAM;  Surgeon: Jacolyn Reedy, MD;  Location: [facility] CATH LAB;  Service: Cardiovascular: Dr. Wynonia Lawman: Mild CAD, 30%pCx.  Otherwise nonobstructive disease.  Normal LV function.  . TOE SURGERY Left 1994  . TONSILLECTOMY  1952  . TRANSTHORACIC ECHOCARDIOGRAM  04/2016   Normal EF of 60-65%.  Normal wall motion.  GR 2 DD.  Aortic sclerosis without stenosis    Current Meds  Medication Sig  . acetaminophen (TYLENOL) 325 MG tablet Take 650 mg by mouth daily as needed for mild pain.  [name] ALPRAZolam (XANAX) 0.25 MG tablet Take 0.5 tablets (0.125 mg total) by mouth 3 (three) times daily as needed for anxiety.  [name] amLODipine (NORVASC) 5 MG tablet Take 1 tablet (5 mg total) by mouth daily.  [name] aspirin 325 MG tablet Take 325 mg by mouth daily.   [name] BIOTIN PO Take 1  capsule by mouth daily.  . Cholecalciferol (VITAMIN D) 2000 UNITS CAPS Take 1 capsule by mouth daily.  . Evening Primrose Oil 500 MG CAPS Take 1 capsule by mouth daily.   . hydrALAZINE (APRESOLINE) 25 MG tablet Take 1 tablet (25 mg total) daily as needed by mouth. If blood pressure over 742 systolic,take in the evening.  . hydroxyurea (HYDREA) 500 MG capsule Take 1 capsule (500 mg total) by mouth every Monday, Wednesday, and Friday.  . levothyroxine (SYNTHROID, LEVOTHROID) 88 MCG tablet Take 88 mcg daily by mouth.   . metroNIDAZOLE (METROCREAM) 0.75 % cream Apply 1 application topically daily as needed (for rosacea).  . nitroGLYCERIN (NITROSTAT) 0.4 MG SL tablet Place 0.4 mg under the tongue every 5 (five) minutes as needed for chest pain.  . ranitidine (ZANTAC) 150 MG capsule Take 150 mg by mouth 2 (two) times daily.   [name] topiramate (TOPAMAX) 25 MG tablet TAKE 1 TABLET BY MOUTH EVERY DAY    Allergies  Allergen Reactions  . Iodine Shortness Of Breath and Other (See Comments)    Pt states "Deadly"  . Prednisone     Facial flushing and swelling  . Shellfish Allergy Anaphylaxis and Other (See Comments)    " I almost died once"     Social History   Socioeconomic History  . Marital status: Widowed    Spouse name: None  . Number of children: None  . Years of education: None  . Highest education level: None  Social Needs  . Financial resource strain: None  . Food insecurity - worry: None  . Food insecurity - inability: None  . Transportation needs - medical: None  . Transportation needs - non-medical: None  Occupational History  . None  Tobacco Use  . Smoking status: Never Smoker  . Smokeless tobacco: Never Used  Substance and Sexual Activity  . Alcohol use: No  . Drug use: No  . Sexual activity: Not Currently  Other Topics Concern  . None  Social History Narrative   Widowed.  Lives home alone.  Retired.  12 th grade education.     \No routine exercise.   Long-term exposure  to passive smoke.      In addition to her true allergies, she had a syncopal episode after starting Zoloft.  She is allergic to feathers (down).  Meloxicam causes upset stomach.  Prednisone makes her face red and hot.  Topamax causes diarrhea and gabapentin causes lightheadedness.    family history includes Gallstones in her brother and brother; Skin cancer in her mother.  Wt Readings from Last 3 Encounters:  03/01/17 152  lb (68.9 kg)  02/05/17 156 lb 4.8 oz (70.9 kg)  01/27/17 154 lb (69.9 kg)    PHYSICAL EXAM BP (!) 146/72   Pulse 80   Ht 5\' 8"  (1.727 m)   Wt 152 lb (68.9 kg)   BMI 23.11 kg/m  -Final standing blood pressure is 117/96 mmHg with a heart rate is 95 bpm No data found.  Orthostatic vitals discussed above  Physical Exam  Constitutional: She is oriented to person, place, and time. She appears well-developed and well-nourished. No distress (Just nervous and anxious).  Appears older than her stated age.  HENT:  Head: Normocephalic and atraumatic.  Eyes: Conjunctivae and EOM are normal. Pupils are equal, round, and reactive to light. No scleral icterus.  Intermittently tearful.  Especially when she talks about having lost her husband.  Neck: Normal range of motion. Neck supple. No hepatojugular reflux and no JVD present. Carotid bruit is not present.  Cardiovascular: Normal rate, regular rhythm and intact distal pulses. Exam reveals gallop and S4. Exam reveals no friction rub.  Murmur heard.  Medium-pitched harsh crescendo-decrescendo early systolic murmur is present with a grade of 1/6 at the upper right sternal border. Pulmonary/Chest: Effort normal and breath sounds normal. No respiratory distress. She has no wheezes. She has no rales.  Abdominal: Soft. Bowel sounds are normal. She exhibits no distension. There is no tenderness. There is no rebound.  Musculoskeletal: Normal range of motion. She exhibits no edema (Trivial pedal edema) or deformity.  Mild kyphosis    Neurological: She is alert and oriented to person, place, and time. No cranial nerve deficit.  Skin: Skin is warm and dry. No rash noted. No erythema.  Psychiatric: She has a normal mood and affect. Her behavior is normal. Judgment and thought content normal.  Nursing note and vitals reviewed.    Adult ECG Report Not checked  Other studies Reviewed: Additional studies/ records that were reviewed today include:  Recent Labs:  --Sleep  Lab Results  Component Value Date   TSH 1.928 02/05/2017   Lab Results  Component Value Date   CHOL 162 04/18/2016   HDL 44 04/18/2016   LDLCALC 106 (H) 04/18/2016   TRIG 58 04/18/2016   CHOLHDL 3.7 04/18/2016    Lab Results  Component Value Date   CREATININE 1.01 (H) 02/07/2017   BUN 21 (H) 02/07/2017   NA 137 02/07/2017   K 3.7 02/07/2017   CL 106 02/07/2017   CO2 25 02/07/2017     ASSESSMENT / PLAN: Problem List Items Addressed This Visit    Difficulty in walking, not elsewhere classified    I think this is probably related to her balance and vertigo issues.  I do think that walking with a walker preferably a rolling walker with seat would be the best option for her.  This would allow her to have a place to sit safely if she starts to feel dizzy.      Essential tremor (Chronic)    This seems to have worsened since her hospitalization this spring.  She is not on any medications such as carbidopa levodopa, Requip. Has been told that this is not true parkinsonism -does not have other features associated with Parkinson's      Labile essential hypertension (Chronic)    Labile hypertension with hypotension is consistent with neurogenic orthostatic hypotension features.  We spent quite a bit of time discussing this pathophysiology.  Her blood pressures however seem to be much better on amlodipine.  I still  think using the as needed hydralazine for pressures over 180 is reasonable. She will follow-up in CV RR with Tommy Medal, RPH- CCP  to discuss decide if any further titration of amlodipine is warranted.  Simply based on her hypotension spells, and less she has Northera, I would be reluctant to further titrate up the amlodipine.      Neurogenic orthostatic hypotension (HCC) - Primary (Chronic)    There was discussion about possibly needing a tilt table test.  She does not need a tilt table test because of how profound her orthostatic vitals were when she is was seen here last.  I do recommend that he sleeps using a wedge pillow which she is doing to avoid supine hypertension exacerbating her orthostatic hypotension. I agree with her concerns with either Primatene or Florinef because of her significant supine hypertension.  With her essential tremor and the neurologic symptoms following her episode of PRES, I think that her labile blood pressures and orthostatic hypotension are very consistent with neurogenic orthostatic hypotension.  I think she would be an excellent candidate for Northera therapy.  I initiated her interaction with Tommy Medal, [place] and will arrange for her to follow-up in CV RR for potentially starting with therapy.  Again because of her significant supine hypertension -with worsening blood pressures on Primatene, I do not think further attempts at using other ProAmatine or Florinef are reasonable.      Supine hypertension (Chronic)    She clearly has evidence of supine hypertension.  We talked about not checking her blood pressure while lying down.  This seems to be a little bit better controlled however with the amlodipine at 5 mg.  I was reluctant to do that, but monitoring her pressures in the hospital, they felt safe doing so.  She seems to be doing better with that she has however had lower blood pressures in the 100-110 mmHg range. I am worried about her orthostatic hypotension as well.  I think she will be a candidate for Northera.       Syncope    She had a true episode of syncope leading to  her hospitalization.  This was probably related to UTI, dehydration and her labile pressures.  It is hard to tell what really triggered what.  It does not sound like it was arrhythmia genic.  Thankfully, she did not suffer any injuries.  She had a follow-up echocardiogram and carotid Dopplers both of which were normal.  Continue to recommend adequate hydration and support stockings if she is able to wear them.  Unfortunately she injured her left arm and wrist/thumb doing something and it has been hard for her to pull up the stockings.      Vertigo    A lot of her dizziness that she is describing is not related to blood pressure changes, but more related to positional changes from vertigo. Recommend using over-the-counter meclizine as needed.  I would be very reluctant to use antiemetics.        Prolonged post hospital visit.  Hospital records reviewed along with her blood pressure log.  We then spent an additional half an hour plus discussing neurogenic orthostatic hypotension and recommendations for potentially considering Northera as a treatment option.  I included Tommy Medal, RPH- CCP in this discussion and recommended that she then follow-up in CV RR with Kristen.  All told combined time with the patient was spent close to 50 minutes with her directly.  50% of the time with the  patient was spent in direct counseling.    Current medicines are reviewed at length with the patient today. (+/- concerns) complicated  The following changes have been made: see below  Patient Instructions  MEDICATION INSTRUCTION  MAY TRY  MECLIZINE  25  MG  FOR VERTIGO UP TO 3 TIMES A DAY AS NEEDED   [facility] PHARMACIST  ( [place] OFFICE)WILL CONTACT YOU IN REGARDS TO FOLLOW UP APPOINTMENT IN REGARDS TO STARTING THE MEDICATION -[place]  Your physician recommends that you schedule a follow-up appointment in [place].    If you need a refill on your cardiac medications before your next  appointment, please call your pharmacy.      Studies Ordered:   No orders of the defined types were placed in this encounter.     Glenetta Hew, M.D., M.S. Interventional Cardiologist   Pager # [phone] Phone # [phone] [address] [place], [place] [address]

## 2017-03-01 NOTE — Patient Instructions (Addendum)
MEDICATION INSTRUCTION  MAY TRY  MECLIZINE  25  MG  FOR VERTIGO UP TO 3 TIMES A DAY AS NEEDED   [facility] PHARMACIST  ( [place] OFFICE)WILL CONTACT YOU IN REGARDS TO FOLLOW UP APPOINTMENT IN REGARDS TO STARTING THE MEDICATION -[place]  Your physician recommends that you schedule a follow-up appointment in [place].    If you need a refill on your cardiac medications before your next appointment, please call your pharmacy.

## 2017-03-02 ENCOUNTER — Encounter: Payer: Self-pay | Admitting: Cardiology

## 2017-03-02 NOTE — Assessment & Plan Note (Signed)
She had a true episode of syncope leading to her hospitalization.  This was probably related to UTI, dehydration and her labile pressures.  It is hard to tell what really triggered what.  It does not sound like it was arrhythmia genic.  Thankfully, she did not suffer any injuries.  She had a follow-up echocardiogram and carotid Dopplers both of which were normal.  Continue to recommend adequate hydration and support stockings if she is able to wear them.  Unfortunately she injured her left arm and wrist/thumb doing something and it has been hard for her to pull up the stockings.

## 2017-03-02 NOTE — Assessment & Plan Note (Signed)
Labile hypertension with hypotension is consistent with neurogenic orthostatic hypotension features.  We spent quite a bit of time discussing this pathophysiology.  Her blood pressures however seem to be much better on amlodipine.  I still think using the as needed hydralazine for pressures over 180 is reasonable. She will follow-up in CV RR with Tommy Medal, RPH- CCP to discuss decide if any further titration of amlodipine is warranted.  Simply based on her hypotension spells, and less she has Northera, I would be reluctant to further titrate up the amlodipine.

## 2017-03-02 NOTE — Assessment & Plan Note (Signed)
I think this is probably related to her balance and vertigo issues.  I do think that walking with a walker preferably a rolling walker with seat would be the best option for her.  This would allow her to have a place to sit safely if she starts to feel dizzy.

## 2017-03-02 NOTE — Assessment & Plan Note (Signed)
This seems to have worsened since her hospitalization this spring.  She is not on any medications such as carbidopa levodopa, Requip. Has been told that this is not true parkinsonism -does not have other features associated with Parkinson's

## 2017-03-02 NOTE — Assessment & Plan Note (Signed)
She clearly has evidence of supine hypertension.  We talked about not checking her blood pressure while lying down.  This seems to be a little bit better controlled however with the amlodipine at 5 mg.  I was reluctant to do that, but monitoring her pressures in the hospital, they felt safe doing so.  She seems to be doing better with that she has however had lower blood pressures in the 100-110 mmHg range. I am worried about her orthostatic hypotension as well.  I think she will be a candidate for Northera.

## 2017-03-02 NOTE — Assessment & Plan Note (Signed)
There was discussion about possibly needing a tilt table test.  She does not need a tilt table test because of how profound her orthostatic vitals were when she is was seen here last.  I do recommend that he sleeps using a wedge pillow which she is doing to avoid supine hypertension exacerbating her orthostatic hypotension. I agree with her concerns with either Primatene or Florinef because of her significant supine hypertension.  With her essential tremor and the neurologic symptoms following her episode of PRES, I think that her labile blood pressures and orthostatic hypotension are very consistent with neurogenic orthostatic hypotension.  I think she would be an excellent candidate for Northera therapy.  I initiated her interaction with Tommy Medal, [place] and will arrange for her to follow-up in CV RR for potentially starting with therapy.  Again because of her significant supine hypertension -with worsening blood pressures on Primatene, I do not think further attempts at using other ProAmatine or Florinef are reasonable.

## 2017-03-02 NOTE — Assessment & Plan Note (Signed)
A lot of her dizziness that she is describing is not related to blood pressure changes, but more related to positional changes from vertigo. Recommend using over-the-counter meclizine as needed.  I would be very reluctant to use antiemetics.

## 2017-03-17 ENCOUNTER — Telehealth: Payer: Self-pay | Admitting: *Deleted

## 2017-03-17 NOTE — Telephone Encounter (Signed)
Received call from [facility] regarding Northera. Patient has been approved for Northera but max of 3 daily. A plan limit exception needs to now be obtained. Will forward to RN for Dr Ellyn Hack

## 2017-03-18 NOTE — Telephone Encounter (Signed)
FORWARD  CV[facility]- following  Approval of medication

## 2017-03-19 NOTE — Telephone Encounter (Signed)
Spoke with patient, current approval is for 90 capsules with copay of $850.  Will now apply for tier exception to see if we can bring the price to a more reasonable level.  Patient understands this is a long process and is appreciative of our help

## 2017-05-21 ENCOUNTER — Ambulatory Visit: Payer: Medicare Other | Admitting: Neurology

## 2017-05-21 ENCOUNTER — Encounter: Payer: Self-pay | Admitting: Neurology

## 2017-05-21 VITALS — BP 120/63 | HR 91 | Ht 69.0 in | Wt 152.0 lb

## 2017-05-21 DIAGNOSIS — I6783 Posterior reversible encephalopathy syndrome: Secondary | ICD-10-CM

## 2017-05-21 DIAGNOSIS — G250 Essential tremor: Secondary | ICD-10-CM

## 2017-05-21 DIAGNOSIS — G903 Multi-system degeneration of the autonomic nervous system: Secondary | ICD-10-CM

## 2017-05-21 MED ORDER — TOPIRAMATE 25 MG PO TABS: 25.0000 mg | ORAL_TABLET | Freq: Two times a day (BID) | ORAL | 1 refills | Status: DC

## 2017-05-21 NOTE — Patient Instructions (Signed)
   We will try to go up on the Topamax to 25 mg twice a day.

## 2017-05-21 NOTE — Progress Notes (Signed)
Reason for visit: Essential tremor headache  Deanna Hill is an 77 y.o. female  History of present illness:  Deanna Hill is a 77 year old right-handed white female with a history of an essential tremor.  The patient has difficulty with feeding herself and with handwriting, the tremor she believes has worsened some since she was seen here last.  The patient was admitted to the hospital with syncope on 04 February 2017.  The patient has been seen by several physicians for orthostatic hypotension.  She checks her blood pressures frequently, she uses a foam wedge at night with sleeping.  The patient has not had any further blackouts.  She does have good days and bad days, some days she has a lot of dizziness issues.  She is taking Topamax 25 mg at night which she believes may have helped the tremor and headaches some.  She does tend to have intermittent headache.  The patient has occasionally noted some tremor in the leg as well, she is walking with more of a stooped posture.  She returns to the office today for an evaluation.  She also reports some increased hair loss.   Past Medical History:  Diagnosis Date  . Anxiety   . Coronary artery disease, non-occlusive 05/2013   Cath for Abn Myoview => mild CAD, 30% pCx. Otw normal Coronaries.  Normlal EF.  . Essential tremor 06/19/2015  . GERD (gastroesophageal reflux disease)   . Hyperlipidemia   . Hypertension   . Hypothyroidism   . IBS (irritable bowel syndrome)   . Orthostatic hypotension 05/22/2016  . Senile calcific aortic valve sclerosis 04/2016   2D echo showed aortic sclerosis but no stenosis.  Normal wall motion.  GR 2 DD  . Thrombocytosis ([place]) 03/11/2011    Past Surgical History:  Procedure Laterality Date  . ABDOMINAL HYSTERECTOMY  1979  . CATARACT EXTRACTION Bilateral 2015   Dr. Herbert Deaner  . CHOLECYSTECTOMY, LAPAROSCOPIC  2001  . LEFT HEART CATHETERIZATION WITH CORONARY ANGIOGRAM N/A 06/08/2013   Procedure: LEFT HEART  CATHETERIZATION WITH CORONARY ANGIOGRAM;  Surgeon: Jacolyn Reedy, MD;  Location: [facility] CATH LAB;  Service: Cardiovascular: Dr. Wynonia Lawman: Mild CAD, 30%pCx.  Otherwise nonobstructive disease.  Normal LV function.  . TOE SURGERY Left 1994  . TONSILLECTOMY  1952  . TRANSTHORACIC ECHOCARDIOGRAM  04/2016   Normal EF of 60-65%.  Normal wall motion.  GR 2 DD.  Aortic sclerosis without stenosis    Family History  Problem Relation Age of Onset  . Skin cancer Mother   . Gallstones Brother   . Gallstones Brother     Social history:  reports that  has never smoked. she has never used smokeless tobacco. She reports that she does not drink alcohol or use drugs.    Allergies  Allergen Reactions  . Iodine Shortness Of Breath and Other (See Comments)    Pt states "Deadly"  . Prednisone     Facial flushing and swelling  . Shellfish Allergy Anaphylaxis and Other (See Comments)    " I almost died once"     Medications:  Prior to Admission medications   Medication Sig Start Date End Date Taking? Authorizing Provider  acetaminophen (TYLENOL) 325 MG tablet Take 650 mg by mouth daily as needed for mild pain.   Yes [provider]  ALPRAZolam (XANAX) 0.25 MG tablet Take 0.5 tablets (0.125 mg total) by mouth 3 (three) times daily as needed for anxiety. 06/19/15  Yes Kathrynn Ducking, MD  amLODipine [facility])  5 MG tablet Take 1 tablet (5 mg total) by mouth daily. 02/07/17  Yes Alma Friendly, MD  aspirin 325 MG tablet Take 325 mg by mouth daily.    Yes [provider]  BIOTIN PO Take 1 capsule by mouth daily.   Yes [provider]  Cholecalciferol (VITAMIN D) 2000 UNITS CAPS Take 1 capsule by mouth daily.   Yes [provider]  Evening Primrose Oil 500 MG CAPS Take 1 capsule by mouth daily.    Yes [provider]  hydroxyurea (HYDREA) 500 MG capsule Take 1 capsule (500 mg total) by mouth every Monday, Wednesday, and Friday. 07/24/16  Yes Nicholas Lose, MD    levothyroxine (SYNTHROID, LEVOTHROID) 88 MCG tablet Take 88 mcg daily by mouth.    Yes [provider]  meclizine (ANTIVERT) 25 MG tablet Take 1 tablet (25 mg total) by mouth 3 (three) times daily as needed for dizziness. 03/01/17  Yes Leonie Man, MD  metroNIDAZOLE (METROCREAM) 0.75 % cream Apply 1 application topically daily as needed (for rosacea).   Yes [provider]  nitroGLYCERIN (NITROSTAT) 0.4 MG SL tablet Place 0.4 mg under the tongue every 5 (five) minutes as needed for chest pain.   Yes [provider]  ranitidine (ZANTAC) 150 MG capsule Take 150 mg by mouth 2 (two) times daily.    Yes [provider]  topiramate (TOPAMAX) 25 MG tablet Take 1 tablet (25 mg total) by mouth 2 (two) times daily. 05/21/17  Yes Kathrynn Ducking, MD  hydrALAZINE (APRESOLINE) 25 MG tablet Take 1 tablet (25 mg total) daily as needed by mouth. If blood pressure over 211 systolic,take in the evening. 01/27/17 04/27/17  Leonie Man, MD    ROS:  Out of a complete 14 system review of symptoms, the patient complains only of the following symptoms, and all other reviewed systems are negative.  Dizziness, tremors, passing out  Blood pressure 120/63, pulse 91, height 5\' 9"  (1.753 m), weight 152 lb (68.9 kg).   Blood pressure, right arm, sitting is 152/78.  Blood pressure, right arm, standing is 104/60.  Physical Exam  General: The patient is alert and cooperative at the time of the examination.  Skin: No significant peripheral edema is noted.   Neurologic Exam  Mental status: The patient is alert and oriented x 3 at the time of the examination. The patient has apparent normal recent and remote memory, with an apparently normal attention span and concentration ability.   Cranial nerves: Facial symmetry is present. Speech is normal, no aphasia or dysarthria is noted. Extraocular movements are full. Visual fields are full.  Motor: The patient has good strength  in all 4 extremities.  Sensory examination: Soft touch sensation is symmetric on the face, arms, and legs.  Coordination: The patient has good finger-nose-finger and heel-to-shin bilaterally.  The patient does have intentional tremors with finger-nose-finger.  No resting tremors are seen.  With handwriting, tremor is translated into the handwriting when drawing a spiral.  Gait and station: The patient has a normal gait.  The patient does appear to have decreased arm swing in a symmetric fashion, no tremors are seen with walking.  Posture is somewhat stooped.  Tandem gait is normal. Romberg is negative. No drift is seen.  Reflexes: Deep tendon reflexes are symmetric.   Assessment/Plan:  1.  Essential tremor  2.  Orthostatic hypotension  3.  Headaches  The patient will try to go up on the Topamax taking 25  mg twice daily.  She had difficulty acclimating to the 25 mg daily dose previously.  The patient will call if she has any significant side effects on this dose increase.  The patient is controlling her orthostasis fairly well, she has not had any further blackouts.  She will follow-up in 6 months.  Jill Alexanders MD 05/21/2017 11:40 AM  [facility] [address] [place], [place] [address]  Phone [phone] Fax [phone]

## 2017-06-08 ENCOUNTER — Encounter: Payer: Self-pay | Admitting: Cardiology

## 2017-06-08 ENCOUNTER — Ambulatory Visit: Payer: Medicare Other | Admitting: Cardiology

## 2017-06-08 VITALS — BP 136/74 | HR 84 | Ht 68.0 in | Wt 153.0 lb

## 2017-06-08 DIAGNOSIS — G903 Multi-system degeneration of the autonomic nervous system: Secondary | ICD-10-CM | POA: Diagnosis not present

## 2017-06-08 DIAGNOSIS — I10 Essential (primary) hypertension: Secondary | ICD-10-CM

## 2017-06-08 NOTE — Patient Instructions (Signed)
Medication Instructions: Continue with current medication   If BP less the 500 systolic(top number) then sit down, drink/eat something, and hold Amlodipine.  Your physician recommends that you schedule a follow-up appointment in: 6 months with Doctor Ellyn Hack  If you need a refill on your cardiac medications before your next appointment, please call your pharmacy.

## 2017-06-10 ENCOUNTER — Encounter: Payer: Self-pay | Admitting: Cardiology

## 2017-06-10 NOTE — Assessment & Plan Note (Addendum)
Allowing for permissive hypertension -> target systolic blood pressure range is 130-160 mmHg..  Do not check blood pressure lying down. Would only as needed treat systolic blood pressures consistently above 170 mmHg.  Also diastolic pressures over 859 mmHg --> PRN hydralazine plus/minus nitroglycerin.

## 2017-06-10 NOTE — Progress Notes (Signed)
PCP: Hulan Fess, MD [place] [facility] Neurology: Dr. Jannifer Franklin Hematology-Oncology Dr. Lindi Adie She has been seen by both Dr. Wynonia Lawman and Dr. Einar Gip for cardiology --now on her third cardiologist   Clinic Note: Chief Complaint  Patient presents with  . Follow-up    Labile HTN with Neurogenic Orthostatic Hypotension --> "stable"    HPI: Deanna Hill is a 77 y.o. female with a PMH below who presents today for 6-month follow-up for labile hypertension with neurogenic orthostatic hypotension. She has a PMH of hypertension, hypothyroidism, essential thrombocytosis as well as essential tremor and mixed dyslipidemia.   February 2018, ADMITTED for PRES (Posterior Reversible Encephalopathic Syndrome) with HTN-Crisis - SBPs well over 200 mmHg range.   --> Per Cardiology c/s eval (Dr. Einar Gip) plan was to start Florinef & support stockings for orthostatic hypotension along with Amlodipine. She did not take Florinef - was "changed" to Midodrine (also did not use). She had severe headaches at that time. She was also started on Topamax at that time for headaches as well as to hopefully help tremor She has noted significant swings in BP ranging from 90s/60s --> 220/120 mmhg: none as high as during her PRES episode.  She has also noted vertigo that can be debilitating since her PRES episode She was first seen here on Nov 14 - noted wide rage of BPs & profound orthostatic dizziness.  Only on Amlodipine. -> added PRN Hydralazine for SBP >180 mmHg & SL NTG if > 200 mmHg; adequate hydration, foot elevation & support stockings (she does not like to wear them).  Planned to consider Northera during f/u visit.  Jan 29, 2017 - Syncope in setting of UTI --> IVF & Abx.  Amlodipine dose reduced to 5 mg.  Deanna Hill was last seen on Mar 01, 2017 --> recommended walking with cane or walker.  We plan to try to start her on [place] there are, however unfortunately, this was not able to be covered by  insurance.  We decided not to use ProAmatine or Florinef because of concerns for hypertension.  [place] apparently would cost $850 co-pay for 90 capsules.  Unable to afford. Dr. Jannifer Franklin from neurology on March 8 intended to go up to 25 mg twice daily of Topamax.-->  She was not able to tolerate.  Recent Hospitalizations: none   Studies Personally Reviewed - (if available, images/films reviewed: From Epic Chart or Care Everywhere)  None  Interval History: Deanna Hill is here today with her daughter indicating that really since I last saw her, she has not had any blood pressure recordings over 175 mmHg.  She is taking her amlodipine and tolerating it relatively well.  She has not had to use hydralazine but may be twice since I saw her.  She is being careful to ensure that she does adequately eat and hydrate.  She has not however been wearing her support stockings, stating that they are difficult to wear.  Mostly difficult to put on.  She has had some blood pressures in the 1 teen range, but none have been below 110.  She has not had any syncopal or near syncopal episodes since I last saw her. She indicated that she was unable to tolerate the increased dose of Topamax but was wondering if she could just simply take 1/2 tablet for the second dose of the day (I felt like that would be a reasonable compromise).  She denies any rapid irregular heartbeats palpitations.  No PND, orthopnea or edema.  No significant dyspnea or chest discomfort with rest or exertion.  Mild edema, but mostly at the end of the day.  No recurrent TIA or amaurosis fugax symptoms.  She does have occasional headaches but they have been better controlled with the Topamax.  No claudication.  ROS: A comprehensive was performed. Review of Systems  Constitutional: Positive for malaise/fatigue. Negative for weight loss.  HENT: Negative for congestion and nosebleeds.   Respiratory: Negative for cough and shortness of breath.     Cardiovascular:       Per HPI  Gastrointestinal: Negative for blood in stool and constipation.  Genitourinary: Negative for hematuria.  Musculoskeletal: Positive for joint pain. Negative for falls (No recent falls).  Neurological: Positive for dizziness (Not as bad), tremors and headaches. Negative for focal weakness and seizures.  Psychiatric/Behavioral: Positive for depression (She seems to be a little bit more stable and less emotionally labile the father where she is from her husband's death.) and memory loss. The patient is nervous/anxious and has insomnia.   All other systems reviewed and are negative.  Constitutional: Positive for malaise/fatigue (Not a new finding).  HENT: Negative for nosebleeds.   Eyes: Positive for blurred vision (With elevated blood pressure).  Respiratory: Negative for shortness of breath (Only when blood pressure is very high).   Musculoskeletal: Negative for joint pain.  Neurological: Positive for tremors (Baseline essential tremor mostly on the left side) and headaches (With elevated blood pressure).  Psychiatric/Behavioral: Positive for depression (This month and lasts are particularly bad because she is coming up on the anniversary of her son's death.  She is also lost her husband and several other family members during this month. ).       She is compounding her grief and sadness from the loss of her family members with her being extremely upset and frustrated by fear of doing any activity because she is scared of passing out.  All other systems reviewed and are negative    I have reviewed and (if needed) personally updated the patient's problem list, medications, allergies, past medical and surgical history, social and family history.   Past Medical History:  Diagnosis Date  . Anxiety   . Coronary artery disease, non-occlusive 05/2013   Cath for Abn Myoview => mild CAD, 30% pCx. Otw normal Coronaries.  Normlal EF.  . Essential tremor 06/19/2015  .  GERD (gastroesophageal reflux disease)   . Hyperlipidemia   . Hypertension   . Hypothyroidism   . IBS (irritable bowel syndrome)   . Orthostatic hypotension 05/22/2016  . Senile calcific aortic valve sclerosis 04/2016   2D echo showed aortic sclerosis but no stenosis.  Normal wall motion.  GR 2 DD  . Thrombocytosis ([place]) 03/11/2011    Past Surgical History:  Procedure Laterality Date  . ABDOMINAL HYSTERECTOMY  1979  . CAROTID DOPPLERS  01/2017   normal Carotid, Sub-Clavian & Vertebral Arteries.  [name] CATARACT EXTRACTION Bilateral 2015   Dr. Herbert Deaner  . CHOLECYSTECTOMY, LAPAROSCOPIC  2001  . LEFT HEART CATHETERIZATION WITH CORONARY ANGIOGRAM N/A 06/08/2013   Procedure: LEFT HEART CATHETERIZATION WITH CORONARY ANGIOGRAM;  Surgeon: Jacolyn Reedy, MD;  Location: [facility] C[facility] LAB;  Service: Cardiovascular: Dr. Wynonia Lawman: Mild CAD, 30%pCx.  Otherwise nonobstructive disease.  Normal LV function.  . TOE SURGERY Left 1994  . TONSILLECTOMY  1952  . TRANSTHORACIC ECHOCARDIOGRAM  02/'18, 11/'18   a) Normal EF of 60-65%.  Normal wall motion.  GR 2 DD.  Aortic sclerosis without stenosis;; b) Mod LVH.  EF 60-65%. Ao Sclerosis w/o AS. Pseudonormal - Gr 2 DD.   Current Meds  Medication Sig  . acetaminophen (TYLENOL) 325 MG tablet Take 650 mg by mouth daily as needed for mild pain.  [name] ALPRAZolam (XANAX) 0.25 MG tablet Take 0.5 tablets (0.125 mg total) by mouth 3 (three) times daily as needed for anxiety.  [name] amLODipine (NORVASC) 5 MG tablet Take 1 tablet (5 mg total) by mouth daily.  [name] aspirin 325 MG tablet Take 325 mg by mouth daily.   [name] BIOTIN PO Take 1 capsule by mouth daily.  . Cholecalciferol (VITAMIN D) 2000 UNITS CAPS Take 1 capsule by mouth daily.  . Evening Primrose Oil 500 MG CAPS Take 1 capsule by mouth daily.   . hydroxyurea (HYDREA) 500 MG capsule Take 1 capsule (500 mg total) by mouth every Monday, Wednesday, and June 25, 2022.  . levothyroxine (SYNTHROID, LEVOTHROID) 88 MCG tablet Take 88 mcg daily by  mouth.   . meclizine (ANTIVERT) 25 MG tablet Take 1 tablet (25 mg total) by mouth 3 (three) times daily as needed for dizziness.  . metroNIDAZOLE (METROCREAM) 0.75 % cream Apply 1 application topically daily as needed (for rosacea).  . nitroGLYCERIN (NITROSTAT) 0.4 MG SL tablet Place 0.4 mg under the tongue every 5 (five) minutes as needed for chest pain.  . ranitidine (ZANTAC) 150 MG capsule Take 150 mg by mouth 2 (two) times daily.   [name] topiramate (TOPAMAX) 25 MG tablet Take 1 tablet (25 mg total) by mouth 2 (two) times daily. (Patient taking differently: Take 25 mg by mouth daily. )  --On 325 mg aspirin because of concern for stroke  Allergies  Allergen Reactions  . Iodine Shortness Of Breath and Other (See Comments)    Pt states "Deadly"  . Prednisone     Facial flushing and swelling  . Shellfish Allergy Anaphylaxis and Other (See Comments)    " I almost died once"     Social History   Tobacco Use  . Smoking status: Never Smoker  . Smokeless tobacco: Never Used  Substance Use Topics  . Alcohol use: No  . Drug use: No   Social History   Social History Narrative   Widowed.  Lives home alone.  Retired.  12 th grade education.     \No routine exercise.   Long-term exposure to passive smoke.      In addition to her true allergies, she had a syncopal episode after starting Zoloft.  She is allergic to feathers (down).  Meloxicam causes upset stomach.  Prednisone makes her face red and hot.  Topamax causes diarrhea and gabapentin causes lightheadedness.  - Husband died ~spring-summer of 2016/06/24 -- very emotionally labile as a result  family history includes Gallstones in her brother and brother; Skin cancer in her mother.  Wt Readings from Last 3 Encounters:  06/08/17 153 lb (69.4 kg)  05/21/17 152 lb (68.9 kg)  03/01/17 152 lb (68.9 kg)    PHYSICAL EXAM BP 136/74 (BP Location: Left Arm, Patient Position: Sitting, Cuff Size: Normal)   Pulse 84   Ht 5\' 8"  (1.727 m)   Wt 153  lb (69.4 kg)   BMI 23.26 kg/m  Physical Exam  Constitutional: She is oriented to person, place, and time. She appears well-developed and well-nourished.  Appears older than stated age.  Seems very shy and timid.  Well-groomed.  HENT:  Head: Normocephalic and atraumatic.  Neck: No JVD present.  Cardiovascular: S1 normal, S2 normal and normal pulses.  No  extrasystoles are present. PMI is not displaced. Exam reveals gallop and S4. Exam reveals no friction rub and no decreased pulses.  Murmur heard.  Medium-pitched harsh crescendo-decrescendo early systolic murmur is present with a grade of 1/6 at the upper right sternal border radiating to the neck. Pulmonary/Chest: Effort normal and breath sounds normal. No respiratory distress. She has no wheezes. She has no rales.  Abdominal: Soft. Bowel sounds are normal. She exhibits no distension. There is no tenderness. There is no rebound and no guarding.  Musculoskeletal: Normal range of motion. She exhibits no edema.  She does have mildly erythematous hand joints and fingertips.  Neurological: She is alert and oriented to person, place, and time.  Skin: Skin is warm and dry. No rash noted. There is erythema (fingertips).  Psychiatric: Judgment and thought content normal.  Still somewhat anxious but much less so than before.  Timid.  She does not openly answer questions.  Nursing note and vitals reviewed.   Adult ECG Report Not checked  Other studies Reviewed: Additional studies/ records that were reviewed today include:  Recent Labs:   Lab Results  Component Value Date   CREATININE 1.01 (H) 02/07/2017   BUN 21 (H) 02/07/2017   NA 137 02/07/2017   K 3.7 02/07/2017   CL 106 02/07/2017   CO2 25 02/07/2017   Lab Results  Component Value Date   HGBA1C 5.9 (H) 02/05/2017   Lab Results  Component Value Date   TSH 1.928 02/05/2017   Last cholesterol by PCP April 2018: TC 172, TG 129, HDL 38, LDL 108.   ASSESSMENT / PLAN: Problem List  Items Addressed This Visit    Supine hypertension (Chronic)    She is aware that she should not take her blood pressure while lying down.  This will only simply show her falsely elevated blood pressures.      Neurogenic orthostatic hypotension (HCC) (Chronic)    Unfortunately, not there is seems to be unobtainable at this time due to cost issues.  We are  working on trying to get planning to continue covered.  I think she would be an excellent candidate.  For now however we will continue using amlodipine at current dose which seems to be keeping her blood pressure from elevating.  Avoiding midodrine/Primatene and certainly Florinef.  We discussed different options and ways of putting on her support stockings.  She needs to wear them and elevate feet when possible.  She still has as needed hydralazine to take for systolic blood pressure greater than 170-180 mmHg.  Additionally take consulting a nitroglycerin if the pressures are above 180 mmHg.  For pressures less than 110 mmHg, light down with feet elevated and drink/eat until feeling better.      Labile essential hypertension - Primary (Chronic)    Allowing for permissive hypertension -> target systolic blood pressure range is 130-160 mmHg..  Do not check blood pressure lying down. Would only as needed treat systolic blood pressures consistently above 170 mmHg.  Also diastolic pressures over 456 mmHg --> PRN hydralazine plus/minus nitroglycerin.         I spent a total of 35 minutes with the patient and chart review. >  50% of the time was spent in direct patient consultation.   Current medicines are reviewed at length with the patient today.  (+/- concerns) Not able to Afford Northera The following changes have been made:  n/a  Patient Instructions  Medication Instructions: Continue with current medication   If BP  less the 027 systolic(top number) then sit down, drink/eat something, and hold Amlodipine.  Your physician  recommends that you schedule a follow-up appointment in: 6 months with Doctor Ellyn Hack  If you need a refill on your cardiac medications before your next appointment, please call your pharmacy.     Studies Ordered:   No orders of the defined types were placed in this encounter.     Glenetta Hew, M.D., M.S. Interventional Cardiologist   Pager # [phone] Phone # [phone] [address] [place], [place] [address]   Thank you for choosing [facility] at [facility]!!

## 2017-06-10 NOTE — Assessment & Plan Note (Signed)
She is aware that she should not take her blood pressure while lying down.  This will only simply show her falsely elevated blood pressures.

## 2017-06-10 NOTE — Assessment & Plan Note (Signed)
Unfortunately, not there is seems to be unobtainable at this time due to cost issues.  We are  working on trying to get planning to continue covered.  I think she would be an excellent candidate.  For now however we will continue using amlodipine at current dose which seems to be keeping her blood pressure from elevating.  Avoiding midodrine/Primatene and certainly Florinef.  We discussed different options and ways of putting on her support stockings.  She needs to wear them and elevate feet when possible.  She still has as needed hydralazine to take for systolic blood pressure greater than 170-180 mmHg.  Additionally take consulting a nitroglycerin if the pressures are above 180 mmHg.  For pressures less than 110 mmHg, light down with feet elevated and drink/eat until feeling better.

## 2017-07-19 ENCOUNTER — Inpatient Hospital Stay: Payer: Medicare Other

## 2017-07-19 ENCOUNTER — Telehealth: Payer: Self-pay | Admitting: Hematology and Oncology

## 2017-07-19 ENCOUNTER — Inpatient Hospital Stay: Payer: Medicare Other | Attending: Hematology and Oncology | Admitting: Hematology and Oncology

## 2017-07-19 DIAGNOSIS — L603 Nail dystrophy: Secondary | ICD-10-CM | POA: Diagnosis not present

## 2017-07-19 DIAGNOSIS — Z79899 Other long term (current) drug therapy: Secondary | ICD-10-CM | POA: Diagnosis not present

## 2017-07-19 DIAGNOSIS — R5383 Other fatigue: Secondary | ICD-10-CM | POA: Insufficient documentation

## 2017-07-19 DIAGNOSIS — L659 Nonscarring hair loss, unspecified: Secondary | ICD-10-CM | POA: Insufficient documentation

## 2017-07-19 DIAGNOSIS — D473 Essential (hemorrhagic) thrombocythemia: Secondary | ICD-10-CM | POA: Diagnosis present

## 2017-07-19 DIAGNOSIS — Z79890 Hormone replacement therapy (postmenopausal): Secondary | ICD-10-CM | POA: Diagnosis not present

## 2017-07-19 LAB — CBC WITH DIFFERENTIAL/PLATELET
BASOS PCT: 1 %
Basophils Absolute: 0.1 10*3/uL (ref 0.0–0.1)
EOS ABS: 0.9 10*3/uL — AB (ref 0.0–0.5)
EOS PCT: 9 %
HCT: 38.4 % (ref 34.8–46.6)
Hemoglobin: 11.9 g/dL (ref 11.6–15.9)
LYMPHS PCT: 11 %
Lymphs Abs: 1 10*3/uL (ref 0.9–3.3)
MCH: 29 pg (ref 25.1–34.0)
MCHC: 31 g/dL — AB (ref 31.5–36.0)
MCV: 93.7 fL (ref 79.5–101.0)
MONOS PCT: 6 %
Monocytes Absolute: 0.6 10*3/uL (ref 0.1–0.9)
NEUTROS ABS: 6.6 10*3/uL — AB (ref 1.5–6.5)
Neutrophils Relative %: 73 %
PLATELETS: 421 10*3/uL — AB (ref 145–400)
RBC: 4.1 MIL/uL (ref 3.70–5.45)
RDW: 15 % — ABNORMAL HIGH (ref 11.2–14.5)
WBC: 9.1 10*3/uL (ref 3.9–10.3)

## 2017-07-19 MED ORDER — HYDROXYUREA 500 MG PO CAPS: 500.0000 mg | ORAL_CAPSULE | ORAL | 3 refills | Status: DC

## 2017-07-19 NOTE — Assessment & Plan Note (Signed)
Essential thrombocytosis with JAK2 mutation: Continue Hydrea 500 mg daily and also aspirin 325 mg by mouth once daily.  Current dose: Hydroxyurea 500 mg Monday Wednesday Friday  Hospitalization 04/17/2016 to 04/22/2016:Possible PRES Posterior reversible encephalopathy syndrome (due to seizure) VS Complicated Migraine   Patient is a been experiencing multiple comorbidities. We will continue with the same dosage of hydroxyurea. Return to clinic in 6 months with labs and follow-up. Hydrea toxicities: 1. Fatigue  2. Hair loss  3. brittle nails   Hospitalization for posterior reversible encephalopathy syndrome  I reviewed her counts and the platelet count is .  Return to clinic in 6 months for follow-up.

## 2017-07-19 NOTE — Progress Notes (Signed)
Patient Care Team: Hulan Fess, MD as PCP - General  DIAGNOSIS:  Encounter Diagnosis  Name Primary?  . Essential thrombocytosis (HCC)    CHIEF COMPLIANT: Follow-up of essential thrombocytosis on hydroxyurea  INTERVAL HISTORY: Deanna Hill is a 77 year old with above-mentioned history of essential thrombocytosis for which she is currently on hydroxyurea.  She appears to be tolerating it fairly well.  With the current schedule of Monday Wednesday Friday she is able to manage her anemia as well.  REVIEW OF SYSTEMS:   Constitutional: Denies fevers, chills or abnormal weight loss Eyes: Denies blurriness of vision Ears, nose, mouth, throat, and face: Denies mucositis or sore throat Respiratory: Denies cough, dyspnea or wheezes Cardiovascular: Denies palpitation, chest discomfort Gastrointestinal:  Denies nausea, heartburn or change in bowel habits Skin: Denies abnormal skin rashes Lymphatics: Denies new lymphadenopathy or easy bruising Neurological:Denies numbness, tingling or new weaknesses Behavioral/Psych: Mood is stable, no new changes  Extremities: No lower extremity edema All other systems were reviewed with the patient and are negative.  I have reviewed the past medical history, past surgical history, social history and family history with the patient and they are unchanged from previous note.  ALLERGIES:  is allergic to iodine; prednisone; and shellfish allergy.  MEDICATIONS:  Current Outpatient Medications  Medication Sig Dispense Refill  . acetaminophen (TYLENOL) 325 MG tablet Take 650 mg by mouth daily as needed for mild pain.    [name] ALPRAZolam (XANAX) 0.25 MG tablet Take 0.5 tablets (0.125 mg total) by mouth 3 (three) times daily as needed for anxiety. 30 tablet 2  . amLODipine (NORVASC) 5 MG tablet Take 1 tablet (5 mg total) by mouth daily.    [name] aspirin 325 MG tablet Take 325 mg by mouth daily.     [name] BIOTIN PO Take 1 capsule by mouth daily.    . Cholecalciferol  (VITAMIN D) 2000 UNITS CAPS Take 1 capsule by mouth daily.    . Evening Primrose Oil 500 MG CAPS Take 1 capsule by mouth daily.     . hydrALAZINE (APRESOLINE) 25 MG tablet Take 1 tablet (25 mg total) daily as needed by mouth. If blood pressure over 267 systolic,take in the evening. 30 tablet 6  . hydroxyurea (HYDREA) 500 MG capsule Take 1 capsule (500 mg total) by mouth every Monday, Wednesday, and Friday. 36 capsule 3  . levothyroxine (SYNTHROID, LEVOTHROID) 88 MCG tablet Take 88 mcg daily by mouth.     . meclizine (ANTIVERT) 25 MG tablet Take 1 tablet (25 mg total) by mouth 3 (three) times daily as needed for dizziness. 90 tablet 0  . metroNIDAZOLE (METROCREAM) 0.75 % cream Apply 1 application topically daily as needed (for rosacea).    . nitroGLYCERIN (NITROSTAT) 0.4 MG SL tablet Place 0.4 mg under the tongue every 5 (five) minutes as needed for chest pain.    . ranitidine (ZANTAC) 150 MG capsule Take 150 mg by mouth 2 (two) times daily.     [name] topiramate (TOPAMAX) 25 MG tablet Take 1 tablet (25 mg total) by mouth 2 (two) times daily. (Patient taking differently: Take 25 mg by mouth daily. ) 180 tablet 1   No current facility-administered medications for this visit.     PHYSICAL EXAMINATION: ECOG PERFORMANCE STATUS: 1 - Symptomatic but completely ambulatory  Vitals:   07/19/17 1200  BP: (!) 156/73  Pulse: 86  Resp: 16  Temp: 98.7 F (37.1 C)  SpO2: 100%   Filed Weights   07/19/17 1200  Weight: 151  lb (68.5 kg)    GENERAL:alert, no distress and comfortable SKIN: skin color, texture, turgor are normal, no rashes or significant lesions EYES: normal, Conjunctiva are pink and non-injected, sclera clear OROPHARYNX:no exudate, no erythema and lips, buccal mucosa, and tongue normal  NECK: supple, thyroid normal size, non-tender, without nodularity LYMPH:  no palpable lymphadenopathy in the cervical, axillary or inguinal LUNGS: clear to auscultation and percussion with normal breathing  effort HEART: regular rate & rhythm and no murmurs and no lower extremity edema ABDOMEN:abdomen soft, non-tender and normal bowel sounds MUSCULOSKELETAL:no cyanosis of digits and no clubbing  NEURO: alert & oriented x 3 with fluent speech, no focal motor/sensory deficits EXTREMITIES: No lower extremity edema  LABORATORY DATA:  I have reviewed the data as listed CMP Latest Ref Rng & Units 02/07/2017 02/06/2017 02/05/2017  Glucose 65 - 99 mg/dL 101(H) 94 97  BUN 6 - 20 mg/dL 21(H) 14 12  Creatinine 0.44 - 1.00 mg/dL 1.01(H) 0.87 0.87  Sodium 135 - 145 mmol/L 137 139 141  Potassium 3.5 - 5.1 mmol/L 3.7 3.6 3.8  Chloride 101 - 111 mmol/L 106 108 110  CO2 22 - 32 mmol/L 25 24 22   Calcium 8.9 - 10.3 mg/dL 8.6(L) 8.9 8.7(L)  Total Protein 6.5 - 8.1 g/dL - - 6.5  Total Bilirubin 0.3 - 1.2 mg/dL - - 0.8  Alkaline Phos 38 - 126 U/L - - 51  AST 15 - 41 U/L - - 15  ALT 14 - 54 U/L - - 12(L)    Lab Results  Component Value Date   WBC 9.1 07/19/2017   HGB 11.9 07/19/2017   HCT 38.4 07/19/2017   MCV 93.7 07/19/2017   PLT 421 (H) 07/19/2017   NEUTROABS 6.6 (H) 07/19/2017    ASSESSMENT & PLAN:  Essential thrombocytosis (HCC) Essential thrombocytosis with JAK2 mutation: Continue Hydrea 500 mg daily and also aspirin 325 mg by mouth once daily.  Current dose: Hydroxyurea 500 mg Monday Wednesday Friday  Hospitalization 04/17/2016 to 04/22/2016:Possible PRES Posterior reversible encephalopathy syndrome (due to seizure) VS Complicated Migraine   Patient is a been experiencing multiple comorbidities. We will continue with the same dosage of hydroxyurea. Return to clinic in 6 months with labs and follow-up. Hydrea toxicities: 1. Fatigue  2. Hair loss  3. brittle nails   Hospitalization for posterior reversible encephalopathy syndrome  I reviewed her counts and the platelet count is .  Return to clinic in 6 months for follow-up.      Orders Placed This Encounter  Procedures    . CBC with Differential (Cancer Center Only)    Standing Status:   Future    Standing Expiration Date:   07/20/2018   The patient has a good understanding of the overall plan. she agrees with it. she will call with any problems that may develop before the next visit here.   [name], MD 07/19/17

## 2017-07-19 NOTE — Telephone Encounter (Signed)
Gave avs and calendar ° °

## 2017-07-23 ENCOUNTER — Other Ambulatory Visit: Payer: Self-pay | Admitting: Neurology

## 2017-07-23 ENCOUNTER — Other Ambulatory Visit: Payer: Self-pay

## 2017-07-23 DIAGNOSIS — M79604 Pain in right leg: Secondary | ICD-10-CM

## 2017-07-23 DIAGNOSIS — M79605 Pain in left leg: Principal | ICD-10-CM

## 2017-08-31 ENCOUNTER — Encounter: Payer: Self-pay | Admitting: Vascular Surgery

## 2017-08-31 ENCOUNTER — Ambulatory Visit: Payer: Medicare Other | Admitting: Vascular Surgery

## 2017-08-31 ENCOUNTER — Ambulatory Visit (HOSPITAL_COMMUNITY)
Admission: RE | Admit: 2017-08-31 | Discharge: 2017-08-31 | Disposition: A | Discharge status: Home / Self Care | Payer: Medicare Other | Source: Ambulatory Visit | Attending: Vascular Surgery | Admitting: Vascular Surgery

## 2017-08-31 VITALS — BP 140/78 | HR 86 | Temp 97.6°F | Resp 16 | Ht 69.0 in | Wt 150.0 lb

## 2017-08-31 DIAGNOSIS — M79604 Pain in right leg: Secondary | ICD-10-CM | POA: Diagnosis not present

## 2017-08-31 DIAGNOSIS — M79605 Pain in left leg: Secondary | ICD-10-CM

## 2017-08-31 DIAGNOSIS — I872 Venous insufficiency (chronic) (peripheral): Secondary | ICD-10-CM | POA: Insufficient documentation

## 2017-08-31 NOTE — Progress Notes (Signed)
[facility] [place]  Patient name: Deanna Hill MRN: [id number] DOB: Dec 16, 1940 Sex: female  REASON FOR CONSULT: Evaluation of lower extremity pain  HPI: Deanna Hill is a 77 y.o. female, who is here today for evaluation of lower extremity pain.  She reports that she has pain in the level of her knees when walking.  This is worse in the morning when she first arises.  Also some numbness in her feet bilaterally.  No true claudication type symptoms.  She does have scattered telangiectasia over both legs but no large varicosities.  Does not report any specific swelling.  Reports that she feels that she has had continued deterioration since her husband's death 6 years ago.  Past Medical History:  Diagnosis Date  . Anxiety   . Coronary artery disease, non-occlusive 05/2013   Cath for Abn Myoview => mild CAD, 30% pCx. Otw normal Coronaries.  Normlal EF.  . Essential tremor 06/19/2015  . GERD (gastroesophageal reflux disease)   . Hyperlipidemia   . Hypertension   . Hypothyroidism   . IBS (irritable bowel syndrome)   . Orthostatic hypotension 05/22/2016  . Senile calcific aortic valve sclerosis 04/2016   2D echo showed aortic sclerosis but no stenosis.  Normal wall motion.  GR 2 DD  . Thrombocytosis ([place]) 03/11/2011    Family History  Problem Relation Age of Onset  . Skin cancer Mother   . Gallstones Brother   . Gallstones Brother     SOCIAL HISTORY: Social History   Socioeconomic History  . Marital status: Widowed    Spouse name: Not on file  . Number of children: Not on file  . Years of education: Not on file  . Highest education level: Not on file  Occupational History  . Not on file  Social Needs  . Financial resource strain: Not on file  . Food insecurity:    Worry: Not on file    Inability: Not on file  . Transportation needs:    Medical: Not on file    Non-medical: Not on file  Tobacco Use  . Smoking  status: Never Smoker  . Smokeless tobacco: Never Used  Substance and Sexual Activity  . Alcohol use: No  . Drug use: No  . Sexual activity: Not Currently  Lifestyle  . Physical activity:    Days per week: Not on file    Minutes per session: Not on file  . Stress: Not on file  Relationships  . Social connections:    Talks on phone: Not on file    Gets together: Not on file    Attends religious service: Not on file    Active member of club or organization: Not on file    Attends meetings of clubs or organizations: Not on file    Relationship status: Not on file  . Intimate partner violence:    Fear of current or ex partner: Not on file    Emotionally abused: Not on file    Physically abused: Not on file    Forced sexual activity: Not on file  Other Topics Concern  . Not on file  Social History Narrative   Widowed.  Lives home alone.  Retired.  12 th grade education.     \No routine exercise.   Long-term exposure to passive smoke.      In addition to her true allergies, she had a syncopal episode after starting Zoloft.  She is allergic to feathers (down).  Meloxicam causes upset stomach.  Prednisone makes her face red and hot.  Topamax causes diarrhea and gabapentin causes lightheadedness.    Allergies  Allergen Reactions  . Iodine Shortness Of Breath and Other (See Comments)    Pt states "Deadly"  . Prednisone     Facial flushing and swelling  . Shellfish Allergy Anaphylaxis and Other (See Comments)    " I almost died once"     Current Outpatient Medications  Medication Sig Dispense Refill  . acetaminophen (TYLENOL) 325 MG tablet Take 650 mg by mouth daily as needed for mild pain.    [name] ALPRAZolam (XANAX) 0.25 MG tablet Take 0.5 tablets (0.125 mg total) by mouth 3 (three) times daily as needed for anxiety. 30 tablet 2  . amLODipine (NORVASC) 5 MG tablet Take 1 tablet (5 mg total) by mouth daily.    [name] aspirin 325 MG tablet Take 325 mg by mouth daily.     [name] BIOTIN PO  Take 1 capsule by mouth daily.    . Cholecalciferol (VITAMIN D) 2000 UNITS CAPS Take 1 capsule by mouth daily.    . Evening Primrose Oil 500 MG CAPS Take 1 capsule by mouth daily.     . hydrALAZINE (APRESOLINE) 25 MG tablet Take 1 tablet (25 mg total) daily as needed by mouth. If blood pressure over 476 systolic,take in the evening. 30 tablet 6  . hydroxyurea (HYDREA) 500 MG capsule Take 1 capsule (500 mg total) by mouth every Monday, Wednesday, and Friday. 36 capsule 3  . levothyroxine (SYNTHROID, LEVOTHROID) 88 MCG tablet Take 88 mcg daily by mouth.     . meclizine (ANTIVERT) 25 MG tablet Take 1 tablet (25 mg total) by mouth 3 (three) times daily as needed for dizziness. 90 tablet 0  . metroNIDAZOLE (METROCREAM) 0.75 % cream Apply 1 application topically daily as needed (for rosacea).    . nitroGLYCERIN (NITROSTAT) 0.4 MG SL tablet Place 0.4 mg under the tongue every 5 (five) minutes as needed for chest pain.    . ranitidine (ZANTAC) 150 MG capsule Take 150 mg by mouth 2 (two) times daily.     [name] topiramate (TOPAMAX) 25 MG tablet Take 1 tablet (25 mg total) by mouth 2 (two) times daily. (Patient taking differently: Take 25 mg by mouth daily. ) 180 tablet 1   No current facility-administered medications for this visit.     REVIEW OF SYSTEMS:  [X]  denotes positive finding, [ ]  denotes negative finding Cardiac  Comments:  Chest pain or chest pressure: x   Shortness of breath upon exertion: x   Short of breath when lying flat:    Irregular heart rhythm:        Vascular    Pain in calf, thigh, or hip brought on by ambulation: x   Pain in feet at night that wakes you up from your sleep:     Blood clot in your veins:    Leg swelling:  x       Pulmonary    Oxygen at home:    Productive cough:     Wheezing:         Neurologic    Sudden weakness in arms or legs:  x   Sudden numbness in arms or legs:     Sudden onset of difficulty speaking or slurred speech:    Temporary loss of vision in  one eye:     Problems with dizziness:  x       Gastrointestinal  Blood in stool:     Vomited blood:         Genitourinary    Burning when urinating:     Blood in urine:        Psychiatric    Major depression:         Hematologic    Bleeding problems:    Problems with blood clotting too easily:        Skin    Rashes or ulcers:        Constitutional    Fever or chills:      PHYSICAL EXAM: Vitals:   08/31/17 1439  BP: 140/78  Pulse: 86  Resp: 16  Temp: 97.6 F (36.4 C)  SpO2: 100%  Weight: 150 lb (68 kg)  Height: 5\' 9"  (1.753 m)    GENERAL: The patient is a well-nourished female, in no acute distress. The vital signs are documented above. CARDIOVASCULAR: 2+ radial and 2+ dorsalis pedis pulses bilaterally.  Scattered telangiectasia over both lower extremities PULMONARY: There is good air exchange  ABDOMEN: Soft and non-tender  MUSCULOSKELETAL: There are no major deformities or cyanosis. NEUROLOGIC: No focal weakness or paresthesias are detected. SKIN: There are no ulcers or rashes noted. PSYCHIATRIC: The patient has a normal affect.  DATA:  Venous duplex in our office today.  This shows no evidence of DVT.  She does have some mild reflux with no significant enlargement in her saphenous vein bilaterally  MEDICAL ISSUES: I discussed these findings at length with the patient.  Explained that she has a normal arterial exam and mild irregularities in her venous study.  Do not feel that this is related to arterial or venous pathology.  She was reassured with this discussion will see [place] again on an as-needed basis.  We will follow-up with her medical doctor for further evaluation   Rosetta Posner, MD [facility] Tel [phone] Pager [phone]

## 2017-10-13 ENCOUNTER — Other Ambulatory Visit: Payer: Self-pay | Admitting: Dermatology

## 2017-11-17 ENCOUNTER — Other Ambulatory Visit: Payer: Self-pay | Admitting: Neurology

## 2017-11-22 ENCOUNTER — Encounter: Payer: Self-pay | Admitting: Cardiology

## 2017-11-22 ENCOUNTER — Ambulatory Visit: Payer: Medicare Other | Admitting: Cardiology

## 2017-11-22 VITALS — BP 142/72 | HR 82 | Ht 68.0 in | Wt 154.2 lb

## 2017-11-22 DIAGNOSIS — G903 Multi-system degeneration of the autonomic nervous system: Secondary | ICD-10-CM

## 2017-11-22 DIAGNOSIS — I10 Essential (primary) hypertension: Secondary | ICD-10-CM | POA: Diagnosis not present

## 2017-11-22 DIAGNOSIS — I358 Other nonrheumatic aortic valve disorders: Secondary | ICD-10-CM

## 2017-11-22 DIAGNOSIS — Z79899 Other long term (current) drug therapy: Secondary | ICD-10-CM | POA: Diagnosis not present

## 2017-11-22 MED ORDER — ASPIRIN EC 81 MG PO TBEC: 81.0000 mg | DELAYED_RELEASE_TABLET | Freq: Every day | ORAL | 3 refills | Status: DC

## 2017-11-22 NOTE — Patient Instructions (Addendum)
    DECREASE TO  76 MG ASPIRIN ONE DAILY   CONTINUE ALL OTHER MEDICATIONS   KEEP WEARING SUPPORT STOCKING/SOCKS  Your physician wants you to follow-up in 6 MONTHS WITH DR HARDING.You will receive a reminder letter in the mail two months in advance. If you don't receive a letter, please call our office to schedule the follow-up appointment.    If you need a refill on your cardiac medications before your next appointment, please call your pharmacy.

## 2017-11-22 NOTE — Progress Notes (Signed)
PCP: Hulan Fess, MD [facility] Neurology: Dr. Jannifer Franklin Hematology-Oncology Dr. Lindi Adie She has been seen by both Dr. Wynonia Lawman and Dr. Einar Gip for cardiology --now on her third cardiologist   Clinic Note: Chief Complaint  Patient presents with  . Follow-up    Neurocardiogenic Syncope - Orthostatic Hypotension.      HPI: Deanna Hill is a 77 y.o. female with a PMH notable for neurogenic orthostatic hypotension with labile hypertension (1 episode of PRES) who presents today for 33-month follow-up. She has a PMH of hypertension, hypothyroidism, essential thrombocytosis as well as essential tremor and mixed dyslipidemia.   February 2018, ADMITTED for PRES (Posterior Reversible Encephalopathic Syndrome) with HTN-Crisis - SBPs well over 200 mmHg range.   --> Per Cardiology c/s eval (Dr. Einar Gip) plan was to start Florinef & support stockings for orthostatic hypotension along with Amlodipine. She did not take Florinef - was "changed" to Midodrine (also did not use). She had severe headaches at that time. She was also started on Topamax at that time for headaches as well as to hopefully help tremor She has noted significant swings in BP ranging from 90s/60s --> 220/120 mmhg: none as high as during her PRES episode.  She has also noted vertigo that can be debilitating since her PRES episode She was first seen here on Nov 14 - noted wide rage of BPs & profound orthostatic dizziness.  Only on Amlodipine. -> added PRN Hydralazine for SBP >180 mmHg & SL NTG if > 200 mmHg; adequate hydration, foot elevation & support stockings (she does not like to wear them).  Planned to consider Northera during f/u visit.-- Northera not covered by Insurance  Jan 29, 2017 - Syncope in setting of UTI --> IVF & Abx.  Amlodipine dose reduced to 5 mg.  YORLEY BUCH was last seen on June 08, 2017.  She does note her blood pressure has not been over 121mmHg.  With amlodipine at current dose well.   Required hydralazine more than twice in the interim since her last visit.  She had not been wearing support stockings because of difficulty putting them on.  Thankfully not had any further syncope or near syncopal episodes.  Questions about .Topamax dosing  Recent Hospitalizations: none   Studies Personally Reviewed - (if available, images/films reviewed: From Epic Chart or Care Everywhere)  08/31/2017: B LE Venous duplex fo reflux eval: Reflux noted in common femoral and popliteal as well as greater saphenous vein.  No DVT.  --> NO PV options for treatment.  Interval History: Deanna Hill is here today with her daughter noting that she has had pretty good blood pressure recordings over the last several months.  She has had one recording over 180 that went back down to less than 160 after about an hour.  She is only had one recording as low as 110 mmHg.  She did feel dizzy with that.  She has not had any further pass out spells besides some dizziness when her blood pressure gets down into the 110-120 range.  She is doing well with her amlodipine and is not having to use any PRN hydralazine at this point.  She is staying adequately hydrated, but complains and how difficult it is to put on her support stockings -but agrees that she needs to wear them. She had one fall, but it was because she lost her balance, tripping.  No pass out spells..  No further issues with Topamax.  Otherwise, she is doing well from a  cardiac standpoint with no major complaints: Cardiovascular ROS: no chest pain or dyspnea on exertion positive for - mild edema & postural dizziness as noted. -- edema usually stable when she wears support stockings.   negative for - irregular heartbeat, loss of consciousness, orthopnea, palpitations, paroxysmal nocturnal dyspnea, rapid heart rate, shortness of breath or TIA/amaurosis fugax symptoms.  No claudication.  ROS: A comprehensive was performed. Review of Systems  Constitutional:  Positive for malaise/fatigue. Negative for weight loss.  HENT: Negative for congestion and nosebleeds.   Respiratory: Negative for cough and shortness of breath.   Cardiovascular:       Per HPI  Gastrointestinal: Negative for blood in stool and constipation.  Genitourinary: Negative for hematuria.  Musculoskeletal: Positive for joint pain. Negative for falls (No recent falls).  Neurological: Positive for dizziness (Not as bad), tremors and headaches. Negative for focal weakness and seizures.  Psychiatric/Behavioral: Positive for depression (Seems to be handling her emotional lability much better, coping with her husband's death the father away from the event it is.) and memory loss. The patient is nervous/anxious and has insomnia.   All other systems reviewed and are negative.   I have reviewed and (if needed) personally updated the patient's problem list, medications, allergies, past medical and surgical history, social and family history.   Past Medical History:  Diagnosis Date  . Anxiety   . Coronary artery disease, non-occlusive 05/2013   Cath for Abn Myoview => mild CAD, 30% pCx. Otw normal Coronaries.  Normlal EF.  . Essential tremor 06/19/2015  . GERD (gastroesophageal reflux disease)   . Hyperlipidemia   . Hypertension   . Hypothyroidism   . IBS (irritable bowel syndrome)   . Orthostatic hypotension 05/22/2016  . Senile calcific aortic valve sclerosis 04/2016   2D echo showed aortic sclerosis but no stenosis.  Normal wall motion.  GR 2 DD  . Thrombocytosis ([place]) 03/11/2011    Past Surgical History:  Procedure Laterality Date  . ABDOMINAL HYSTERECTOMY  1979  . CAROTID DOPPLERS  01/2017   normal Carotid, Sub-Clavian & Vertebral Arteries.  [name] CATARACT EXTRACTION Bilateral 2015   Dr. Herbert Deaner  . CHOLECYSTECTOMY, LAPAROSCOPIC  2001  . LEFT HEART CATHETERIZATION WITH CORONARY ANGIOGRAM N/A 06/08/2013   Procedure: LEFT HEART CATHETERIZATION WITH CORONARY ANGIOGRAM;  Surgeon: Jacolyn Reedy, MD;  Location: [facility] CATH LAB;  Service: Cardiovascular: Dr. Wynonia Lawman: Mild CAD, 30%pCx.  Otherwise nonobstructive disease.  Normal LV function.  . TOE SURGERY Left 1994  . TONSILLECTOMY  1952  . TRANSTHORACIC ECHOCARDIOGRAM  02/'18, 11/'18   a) Normal EF of 60-65%.  Normal wall motion.  GR 2 DD.  Aortic sclerosis without stenosis;; b) Mod LVH. EF 60-65%. Ao Sclerosis w/o AS. Pseudonormal - Gr 2 DD.   Current Meds  Medication Sig  . acetaminophen (TYLENOL) 325 MG tablet Take 650 mg by mouth daily as needed for mild pain.  [name] ALPRAZolam (XANAX) 0.25 MG tablet Take 0.5 tablets (0.125 mg total) by mouth 3 (three) times daily as needed for anxiety.  [name] amLODipine (NORVASC) 5 MG tablet Take 1 tablet (5 mg total) by mouth daily.  [name] BIOTIN PO Take 1 capsule by mouth daily.  . Cholecalciferol (VITAMIN D) 2000 UNITS CAPS Take 1 capsule by mouth daily.  . Evening Primrose Oil 500 MG CAPS Take 1 capsule by mouth daily.   . hydroxyurea (HYDREA) 500 MG capsule Take 1 capsule (500 mg total) by mouth every Monday, Wednesday, and Friday.  . levothyroxine (SYNTHROID, LEVOTHROID) 88  MCG tablet Take 88 mcg daily by mouth.   . meclizine (ANTIVERT) 25 MG tablet Take 1 tablet (25 mg total) by mouth 3 (three) times daily as needed for dizziness.  . metroNIDAZOLE (METROCREAM) 0.75 % cream Apply 1 application topically daily as needed (for rosacea).  . nitroGLYCERIN (NITROSTAT) 0.4 MG SL tablet Place 0.4 mg under the tongue every 5 (five) minutes as needed for chest pain.  . ranitidine (ZANTAC) 150 MG capsule Take 150 mg by mouth 2 (two) times daily.   [name] topiramate (TOPAMAX) 25 MG tablet TAKE 1 TABLET BY MOUTH TWICE A DAY  . [DISCONTINUED] aspirin 325 MG tablet Take 325 mg by mouth daily.   --On 325 mg aspirin because of concern for stroke  Allergies  Allergen Reactions  . Iodine Shortness Of Breath and Other (See Comments)    Pt states "Deadly"  . Prednisone     Facial flushing and swelling  . Shellfish  Allergy Anaphylaxis and Other (See Comments)    " I almost died once"     Social History   Tobacco Use  . Smoking status: Never Smoker  . Smokeless tobacco: Never Used  Substance Use Topics  . Alcohol use: No  . Drug use: No   Social History   Social History Narrative   Widowed.  Lives home alone.  Retired.  12 th grade education.     \No routine exercise.   Long-term exposure to passive smoke.      In addition to her true allergies, she had a syncopal episode after starting Zoloft.  She is allergic to feathers (down).  Meloxicam causes upset stomach.  Prednisone makes her face red and hot.  Topamax causes diarrhea and gabapentin causes lightheadedness.  - Husband died ~spring-summer of June 27, 2016 -- very emotionally labile as a result  family history includes Gallstones in her brother and brother; Skin cancer in her mother.  Wt Readings from Last 3 Encounters:  11/22/17 154 lb 3.2 oz (69.9 kg)  08/31/17 150 lb (68 kg)  07/19/17 151 lb (68.5 kg)    PHYSICAL EXAM BP (!) 142/72   Pulse 82   Ht 5\' 8"  (1.727 m)   Wt 154 lb 3.2 oz (69.9 kg)   BMI 23.45 kg/m  Physical Exam  Constitutional: She is oriented to person, place, and time. She appears well-developed and well-nourished.  Appears older than stated age.  Seems very shy and timid.  Well-groomed.  HENT:  Head: Normocephalic and atraumatic.  Neck: Normal range of motion. Neck supple. No JVD present.  Cardiovascular: Normal rate, regular rhythm, S1 normal, S2 normal, intact distal pulses and normal pulses.  No extrasystoles are present. PMI is not displaced. Exam reveals gallop and S4. Exam reveals no friction rub.  Murmur heard.  Medium-pitched harsh crescendo-decrescendo early systolic murmur is present with a grade of 1/6 at the upper right sternal border radiating to the neck. Pulmonary/Chest: Effort normal and breath sounds normal. No respiratory distress. She has no wheezes. She has no rales.  Abdominal: Soft. Bowel  sounds are normal. She exhibits no distension. There is no tenderness. There is no rebound and no guarding.  Musculoskeletal: Normal range of motion. She exhibits no edema.  She does have mildly erythematous hand joints and fingertips.  Neurological: She is alert and oriented to person, place, and time.  Notable tremor, but it seems to be more stable.  More just intention tremor than resting tremor other than just her right hand.  Skin: Skin is warm  and dry. No rash noted. No erythema (fingertips).  Psychiatric: She has a normal mood and affect. Her behavior is normal. Judgment and thought content normal.  Much less anxious.  Appears to be somewhat content.  Answers more questions and not always deferring to her daughter.  Nursing note and vitals reviewed.   Adult ECG Report Not checked  Other studies Reviewed: Additional studies/ records that were reviewed today include:  Recent Labs:   Lab Results  Component Value Date   CREATININE 1.01 (H) 02/07/2017   BUN 21 (H) 02/07/2017   NA 137 02/07/2017   K 3.7 02/07/2017   CL 106 02/07/2017   CO2 25 02/07/2017   Lab Results  Component Value Date   HGBA1C 5.9 (H) 02/05/2017   Lab Results  Component Value Date   TSH 1.928 02/05/2017   Last cholesterol by PCP April 2018: TC 172, TG 129, HDL 38, LDL 108.   ASSESSMENT / PLAN: Problem List Items Addressed This Visit    Aortic valve sclerosis (Chronic)    No significant finding on prior echocardiogram.  At this point I think we can just reassess if the murmur worsens.      Relevant Medications   aspirin EC 81 MG tablet   Other Relevant Orders   EKG 12-Lead (Completed)   Encounter for long-term (current) use of medications   Relevant Orders   EKG 12-Lead (Completed)   Labile essential hypertension (Chronic)    Continue to allow for permissive hypertension.  Most of her blood pressure recordings are within her target range of 130-160 mmHg. Again stressed the importance of not  checking her blood pressure while lying down.  Only treating sustained systolic pressures over 701-779 mmHg or diastolic pressures greater than 100 mmHg.  She has hydralazine ordered.       Relevant Medications   aspirin EC 81 MG tablet   Other Relevant Orders   EKG 12-Lead (Completed)   Neurogenic orthostatic hypotension (HCC) - Primary (Chronic)    Overall, seems to be stable.  As long as she continues to be doing well and we are not requiring midodrine her other medications, I think we will hold off on changes, but there have been updates in the availability of Northera, and we can potentially reconsider in follow-up visits.  For now continue with amlodipine for blood pressure control and allowing for permissive hypertension.  Because of issues with falling, I am to have her reduce her aspirin dose to 81 mg.      Relevant Medications   aspirin EC 81 MG tablet      I spent a total of 25 minutes with the patient and chart review. >  50% of the time was spent in direct patient consultation.   Current medicines are reviewed at length with the patient today.  (+/- concerns) n/a The following changes have been made:  n/a  Patient Instructions     DECREASE TO  42 MG ASPIRIN ONE DAILY   CONTINUE ALL OTHER MEDICATIONS   KEEP WEARING SUPPORT STOCKING/SOCKS  Your physician wants you to follow-up in [place].You will receive a reminder letter in the mail two months in advance. If you don't receive a letter, please call our office to schedule the follow-up appointment.    If you need a refill on your cardiac medications before your next appointment, please call your pharmacy.  Studies Ordered:   Orders Placed This Encounter  Procedures  . EKG 12-Lead  Glenetta Hew, M.D., M.S. Interventional Cardiologist   Pager # [phone] Phone # [phone] [address] [place], [place] [address]   Thank you for choosing Heartcare at  [facility]!!

## 2017-11-28 ENCOUNTER — Encounter: Payer: Self-pay | Admitting: Cardiology

## 2017-11-28 NOTE — Assessment & Plan Note (Signed)
Continue to allow for permissive hypertension.  Most of her blood pressure recordings are within her target range of 130-160 mmHg. Again stressed the importance of not checking her blood pressure while lying down.  Only treating sustained systolic pressures over 539-122 mmHg or diastolic pressures greater than 100 mmHg.  She has hydralazine ordered.

## 2017-11-28 NOTE — Assessment & Plan Note (Signed)
No significant finding on prior echocardiogram.  At this point I think we can just reassess if the murmur worsens.

## 2017-11-28 NOTE — Assessment & Plan Note (Signed)
Overall, seems to be stable.  As long as she continues to be doing well and we are not requiring midodrine her other medications, I think we will hold off on changes, but there have been updates in the availability of Northera, and we can potentially reconsider in follow-up visits.  For now continue with amlodipine for blood pressure control and allowing for permissive hypertension.  Because of issues with falling, I am to have her reduce her aspirin dose to 81 mg.

## 2017-11-29 ENCOUNTER — Encounter: Payer: Self-pay | Admitting: Neurology

## 2017-11-29 ENCOUNTER — Ambulatory Visit: Payer: Medicare Other | Admitting: Neurology

## 2017-11-29 VITALS — BP 122/58 | HR 88 | Ht 68.0 in | Wt 153.0 lb

## 2017-11-29 DIAGNOSIS — G250 Essential tremor: Secondary | ICD-10-CM

## 2017-11-29 MED ORDER — PRIMIDONE 50 MG PO TABS: 50.0000 mg | ORAL_TABLET | Freq: Every day | ORAL | 3 refills | Status: DC

## 2017-11-29 NOTE — Progress Notes (Signed)
Reason for visit: Essential tremor  Deanna Hill is an 77 y.o. female  History of present illness:  Deanna Hill is a 77 year old right-handed white female with a history of an essential tremor.  The patient has been on Topamax in low-dose taking 25 mg daily.  The patient reports cognitive side effects on the medication.  She in general feels poorly.  She has had issues with orthostatic hypotension, she will have good days and bad days with her blood pressure and with her tremor.  When she gets hungry, the tremor may significant worsen.  We may affect her ability to use her hands to perform tasks.  The patient has not had any blackouts since last seen, she may have episodes of dizziness where she has to hold onto something left the dizziness past.  The patient walks with a cane.  She returns for an evaluation.  Past Medical History:  Diagnosis Date  . Anxiety   . Coronary artery disease, non-occlusive 05/2013   Cath for Abn Myoview => mild CAD, 30% pCx. Otw normal Coronaries.  Normlal EF.  . Essential tremor 06/19/2015  . GERD (gastroesophageal reflux disease)   . Hyperlipidemia   . Hypertension   . Hypothyroidism   . IBS (irritable bowel syndrome)   . Orthostatic hypotension 05/22/2016  . Senile calcific aortic valve sclerosis 04/2016   2D echo showed aortic sclerosis but no stenosis.  Normal wall motion.  GR 2 DD  . Thrombocytosis ([place]) 03/11/2011    Past Surgical History:  Procedure Laterality Date  . ABDOMINAL HYSTERECTOMY  1979  . CAROTID DOPPLERS  01/2017   normal Carotid, Sub-Clavian & Vertebral Arteries.  [name] CATARACT EXTRACTION Bilateral 2015   Dr. Herbert Deaner  . CHOLECYSTECTOMY, LAPAROSCOPIC  2001  . LEFT HEART CATHETERIZATION WITH CORONARY ANGIOGRAM N/A 06/08/2013   Procedure: LEFT HEART CATHETERIZATION WITH CORONARY ANGIOGRAM;  Surgeon: Jacolyn Reedy, MD;  Location: [facility] CATH LAB;  Service: Cardiovascular: Dr. Wynonia Lawman: Mild CAD, 30%pCx.  Otherwise nonobstructive disease.   Normal LV function.  . TOE SURGERY Left 1994  . TONSILLECTOMY  1952  . TRANSTHORACIC ECHOCARDIOGRAM  02/'18, 11/'18   a) Normal EF of 60-65%.  Normal wall motion.  GR 2 DD.  Aortic sclerosis without stenosis;; b) Mod LVH. EF 60-65%. Ao Sclerosis w/o AS. Pseudonormal - Gr 2 DD.    Family History  Problem Relation Age of Onset  . Skin cancer Mother   . Gallstones Brother   . Gallstones Brother     Social history:  reports that she has never smoked. She has never used smokeless tobacco. She reports that she does not drink alcohol or use drugs.    Allergies  Allergen Reactions  . Iodine Shortness Of Breath and Other (See Comments)    Pt states "Deadly"  . Prednisone     Facial flushing and swelling  . Shellfish Allergy Anaphylaxis and Other (See Comments)    " I almost died once"     Medications:  Prior to Admission medications   Medication Sig Start Date End Date Taking? Authorizing Provider  acetaminophen (TYLENOL) 325 MG tablet Take 650 mg by mouth daily as needed for mild pain.   Yes [provider]  ALPRAZolam (XANAX) 0.25 MG tablet Take 0.5 tablets (0.125 mg total) by mouth 3 (three) times daily as needed for anxiety. 06/19/15  Yes Kathrynn Ducking, MD  amLODipine (NORVASC) 5 MG tablet Take 1 tablet (5 mg total) by mouth daily. 02/07/17  Yes [place],  Adline Peals, MD  aspirin EC 81 MG tablet Take 1 tablet (81 mg total) by mouth daily. 11/22/17  Yes Leonie Man, MD  BIOTIN PO Take 1 capsule by mouth daily.   Yes [provider]  Cholecalciferol (VITAMIN D) 2000 UNITS CAPS Take 1 capsule by mouth daily.   Yes [provider]  Evening Primrose Oil 500 MG CAPS Take 1 capsule by mouth daily.    Yes [provider]  hydroxyurea (HYDREA) 500 MG capsule Take 1 capsule (500 mg total) by mouth every Monday, Wednesday, and Friday. 07/19/17  Yes Nicholas Lose, MD  levothyroxine (SYNTHROID, LEVOTHROID) 88 MCG tablet Take 88 mcg daily by mouth.    Yes  [provider]  meclizine (ANTIVERT) 25 MG tablet Take 1 tablet (25 mg total) by mouth 3 (three) times daily as needed for dizziness. 03/01/17  Yes Leonie Man, MD  metroNIDAZOLE (METROCREAM) 0.75 % cream Apply 1 application topically daily as needed (for rosacea).   Yes [provider]  nitroGLYCERIN (NITROSTAT) 0.4 MG SL tablet Place 0.4 mg under the tongue every 5 (five) minutes as needed for chest pain.   Yes [provider]  ranitidine (ZANTAC) 150 MG capsule Take 150 mg by mouth 2 (two) times daily.    Yes [provider]  hydrALAZINE (APRESOLINE) 25 MG tablet Take 1 tablet (25 mg total) daily as needed by mouth. If blood pressure over 267 systolic,take in the evening. Patient not taking: Reported on 11/22/2017 01/27/17 04/27/17  Leonie Man, MD  primidone (MYSOLINE) 50 MG tablet Take 1 tablet (50 mg total) by mouth at bedtime. 11/29/17   Kathrynn Ducking, MD    ROS:  Out of a complete 14 system review of symptoms, the patient complains only of the following symptoms, and all other reviewed systems are negative.  Tremor  Blood pressure (!) 122/58, pulse 88, height 5\' 8"  (1.727 m), weight 153 lb (69.4 kg).   Blood pressure, right arm, sitting is 116/78.  Blood pressure, right arm, standing is 92/60.  Physical Exam  General: The patient is alert and cooperative at the time of the examination.  Skin: No significant peripheral edema is noted.   Neurologic Exam  Mental status: The patient is alert and oriented x 3 at the time of the examination. The patient has apparent normal recent and remote memory, with an apparently normal attention span and concentration ability.   Cranial nerves: Facial symmetry is present. Speech is normal, no aphasia or dysarthria is noted. Extraocular movements are full. Visual fields are full.  Motor: The patient has good strength in all 4 extremities.  Sensory examination: Soft touch sensation is symmetric  on the face, arms, and legs.  Coordination: The patient has good finger-nose-finger and heel-to-shin bilaterally.  The patient has tremors in both upper extremities with both action and resting components.  Gait and station: The patient has the ability to walk independently, she usually uses a cane for ambulation.  Posture is stooped, with walking, there is an asymmetry with the tremor, the right arm is more involved.  There is some decrease in arm swing bilaterally. Romberg is negative. No drift is seen.  Reflexes: Deep tendon reflexes are symmetric.   Assessment/Plan:  1.  Essential tremor  2.  Orthostatic hypotension  The patient continues to have some problems with orthostasis, she has not had any blackouts.  She is having cognitive side effects on the Topamax.  The Topamax will be discontinued, we will  try low-dose Mysoline at night taking 50 mg.  She will call for any dose adjustment.  Otherwise she will follow-up in 6 months.  Jill Alexanders MD 11/29/2017 11:10 AM  [facility] [address] [place], [place] [address]  Phone [phone] Fax [phone]

## 2017-11-29 NOTE — Patient Instructions (Signed)
Stop the Topamax and start Mysoline for the tremor.

## 2017-11-30 ENCOUNTER — Telehealth: Payer: Self-pay | Admitting: Neurology

## 2017-11-30 NOTE — Telephone Encounter (Signed)
I called the patient.  The patient had stopped the Topamax and started Mysoline at 50 mg at night.  She feels dizzy and nauseated today.  The patient is to wait until she feels back to normal, then she will try one half of a 50 mg Mysoline tablet.  She will call if she is not doing well.

## 2017-11-30 NOTE — Telephone Encounter (Signed)
Patient called and stated that she was given a new medication at her apt yesterday and she took it yesterday. She is not feeling well at all and thinks it is due to the medication. She is experiencing dizziness and nausea. Please call and advise.

## 2017-12-17 ENCOUNTER — Telehealth: Payer: Self-pay | Admitting: Hematology and Oncology

## 2017-12-17 NOTE — Telephone Encounter (Signed)
[facility] PAL 11/7 - moved appointments from 11/7 to 11/5. Spoke with patient.

## 2017-12-24 ENCOUNTER — Telehealth: Payer: Self-pay | Admitting: Neurology

## 2017-12-24 MED ORDER — CLONAZEPAM 0.5 MG PO TABS: 0.5000 mg | ORAL_TABLET | Freq: Every day | ORAL | 1 refills | Status: DC

## 2017-12-24 NOTE — Addendum Note (Signed)
Addended by: Kathrynn Ducking on: 12/24/2017 01:18 PM   Modules accepted: Orders

## 2017-12-24 NOTE — Telephone Encounter (Signed)
Pt is asking for a call to discuss her management of primidone (MYSOLINE) 50 MG tablet.  Pt states she was taking this with Topomax, she then stopped taking the Topomax and only took the Primidone last night, pt states she feels better but week with tremors. Please call

## 2017-12-24 NOTE — Telephone Encounter (Signed)
I called the patient.  The patient has not been able to tolerate Topamax or Mysoline.  We will stop these medications, the patient will be placed on low-dose clonazepam, take 0.5 mg at night.  She is no longer on alprazolam.

## 2018-01-18 ENCOUNTER — Telehealth: Payer: Self-pay | Admitting: Hematology and Oncology

## 2018-01-18 ENCOUNTER — Inpatient Hospital Stay (HOSPITAL_BASED_OUTPATIENT_CLINIC_OR_DEPARTMENT_OTHER): Payer: Medicare Other | Admitting: Hematology and Oncology

## 2018-01-18 ENCOUNTER — Inpatient Hospital Stay: Payer: Medicare Other | Attending: Hematology and Oncology

## 2018-01-18 DIAGNOSIS — L603 Nail dystrophy: Secondary | ICD-10-CM

## 2018-01-18 DIAGNOSIS — Z79899 Other long term (current) drug therapy: Secondary | ICD-10-CM

## 2018-01-18 DIAGNOSIS — Z7982 Long term (current) use of aspirin: Secondary | ICD-10-CM | POA: Diagnosis not present

## 2018-01-18 DIAGNOSIS — D473 Essential (hemorrhagic) thrombocythemia: Secondary | ICD-10-CM | POA: Insufficient documentation

## 2018-01-18 LAB — CBC WITH DIFFERENTIAL (CANCER CENTER ONLY)
Abs Immature Granulocytes: 0.03 10*3/uL (ref 0.00–0.07)
Basophils Absolute: 0.1 10*3/uL (ref 0.0–0.1)
Basophils Relative: 1 %
EOS PCT: 10 %
Eosinophils Absolute: 1.2 10*3/uL — ABNORMAL HIGH (ref 0.0–0.5)
HEMATOCRIT: 40.6 % (ref 36.0–46.0)
HEMOGLOBIN: 12.4 g/dL (ref 12.0–15.0)
Immature Granulocytes: 0 %
Lymphocytes Relative: 8 %
Lymphs Abs: 0.9 10*3/uL (ref 0.7–4.0)
MCH: 29 pg (ref 26.0–34.0)
MCHC: 30.5 g/dL (ref 30.0–36.0)
MCV: 94.9 fL (ref 80.0–100.0)
MONO ABS: 0.7 10*3/uL (ref 0.1–1.0)
Monocytes Relative: 6 %
NEUTROS ABS: 8.8 10*3/uL — AB (ref 1.7–7.7)
Neutrophils Relative %: 75 %
Platelet Count: 465 10*3/uL — ABNORMAL HIGH (ref 150–400)
RBC: 4.28 MIL/uL (ref 3.87–5.11)
RDW: 14 % (ref 11.5–15.5)
WBC: 11.7 10*3/uL — AB (ref 4.0–10.5)
nRBC: 0 % (ref 0.0–0.2)

## 2018-01-18 NOTE — Telephone Encounter (Signed)
Gave pt avs and calendar  °

## 2018-01-18 NOTE — Progress Notes (Signed)
Patient Care Team: Hulan Fess, MD as PCP - General  DIAGNOSIS:  Encounter Diagnosis  Name Primary?  . Essential thrombocytosis (HCC)    Current treatment: Hydrea 500 mg 3 times a week CHIEF COMPLIANT: Multiple problems associated with tremors and imbalance  INTERVAL HISTORY: Deanna Hill is a 77 year old lady with above-mentioned history of essential thrombocytosis is currently on Hydrea.  She is tolerating it reasonably well.  She is been taking it 3 times a week and her side effects appear to be under reasonable control.  Her aspirin dose has been reduced to 81 mg.  REVIEW OF SYSTEMS:   Constitutional: Denies fevers, chills or abnormal weight loss Eyes: Denies blurriness of vision Ears, nose, mouth, throat, and face: Denies mucositis or sore throat Respiratory: Denies cough, dyspnea or wheezes Cardiovascular: Denies palpitation, chest discomfort Gastrointestinal:  Denies nausea, heartburn or change in bowel habits Skin: Denies abnormal skin rashes Lymphatics: Denies new lymphadenopathy or easy bruising Neurological:Denies numbness, tingling or new weaknesses Behavioral/Psych: Mood is stable, no new changes  Extremities: No lower extremity edema  All other systems were reviewed with the patient and are negative.  I have reviewed the past medical history, past surgical history, social history and family history with the patient and they are unchanged from previous note.  ALLERGIES:  is allergic to iodine; prednisone; and shellfish allergy.  MEDICATIONS:  Current Outpatient Medications  Medication Sig Dispense Refill  . acetaminophen (TYLENOL) 325 MG tablet Take 650 mg by mouth daily as needed for mild pain.    [name] amLODipine (NORVASC) 5 MG tablet Take 1 tablet (5 mg total) by mouth daily.    [name] aspirin EC 81 MG tablet Take 1 tablet (81 mg total) by mouth daily. 90 tablet 3  . BIOTIN PO Take 1 capsule by mouth daily.    . Cholecalciferol (VITAMIN D) 2000 UNITS CAPS Take  1 capsule by mouth daily.    . clonazePAM (KLONOPIN) 0.5 MG tablet Take 1 tablet (0.5 mg total) by mouth at bedtime. 30 tablet 1  . Evening Primrose Oil 500 MG CAPS Take 1 capsule by mouth daily.     . hydrALAZINE (APRESOLINE) 25 MG tablet Take 1 tablet (25 mg total) daily as needed by mouth. If blood pressure over 417 systolic,take in the evening. (Patient not taking: Reported on 11/22/2017) 30 tablet 6  . hydroxyurea (HYDREA) 500 MG capsule Take 1 capsule (500 mg total) by mouth every Monday, Wednesday, and Friday. 36 capsule 3  . levothyroxine (SYNTHROID, LEVOTHROID) 88 MCG tablet Take 88 mcg daily by mouth.     . meclizine (ANTIVERT) 25 MG tablet Take 1 tablet (25 mg total) by mouth 3 (three) times daily as needed for dizziness. 90 tablet 0  . metroNIDAZOLE (METROCREAM) 0.75 % cream Apply 1 application topically daily as needed (for rosacea).    . nitroGLYCERIN (NITROSTAT) 0.4 MG SL tablet Place 0.4 mg under the tongue every 5 (five) minutes as needed for chest pain.    . ranitidine (ZANTAC) 150 MG capsule Take 150 mg by mouth 2 (two) times daily.      No current facility-administered medications for this visit.     PHYSICAL EXAMINATION: ECOG PERFORMANCE STATUS: 1 - Symptomatic but completely ambulatory  Vitals:   01/18/18 1328  BP: 140/61  Pulse: 99  Resp: 16  Temp: 98.5 F (36.9 C)  SpO2: 100%   Filed Weights   01/18/18 1328  Weight: 152 lb 9.6 oz (69.2 kg)    GENERAL:alert, no  distress and comfortable SKIN: skin color, texture, turgor are normal, no rashes or significant lesions EYES: normal, Conjunctiva are pink and non-injected, sclera clear OROPHARYNX:no exudate, no erythema and lips, buccal mucosa, and tongue normal  NECK: supple, thyroid normal size, non-tender, without nodularity LYMPH:  no palpable lymphadenopathy in the cervical, axillary or inguinal LUNGS: clear to auscultation and percussion with normal breathing effort HEART: regular rate & rhythm and no murmurs  and no lower extremity edema ABDOMEN:abdomen soft, non-tender and normal bowel sounds MUSCULOSKELETAL:no cyanosis of digits and no clubbing  NEURO: alert & oriented x 3 with fluent speech, no focal motor/sensory deficits EXTREMITIES: No lower extremity edema   LABORATORY DATA:  I have reviewed the data as listed CMP Latest Ref Rng & Units 02/07/2017 02/06/2017 02/05/2017  Glucose 65 - 99 mg/dL 101(H) 94 97  BUN 6 - 20 mg/dL 21(H) 14 12  Creatinine 0.44 - 1.00 mg/dL 1.01(H) 0.87 0.87  Sodium 135 - 145 mmol/L 137 139 141  Potassium 3.5 - 5.1 mmol/L 3.7 3.6 3.8  Chloride 101 - 111 mmol/L 106 108 110  CO2 22 - 32 mmol/L 25 24 22   Calcium 8.9 - 10.3 mg/dL 8.6(L) 8.9 8.7(L)  Total Protein 6.5 - 8.1 g/dL - - 6.5  Total Bilirubin 0.3 - 1.2 mg/dL - - 0.8  Alkaline Phos 38 - 126 U/L - - 51  AST 15 - 41 U/L - - 15  ALT 14 - 54 U/L - - 12(L)    Lab Results  Component Value Date   WBC 11.7 (H) 01/18/2018   HGB 12.4 01/18/2018   HCT 40.6 01/18/2018   MCV 94.9 01/18/2018   PLT 465 (H) 01/18/2018   NEUTROABS 8.8 (H) 01/18/2018    ASSESSMENT & PLAN:  Essential thrombocytosis (HCC) Essential thrombocytosis with JAK2 mutation: Continue Hydrea 500 mg daily and also aspirin 325 mg by mouth once daily.  Current dose: Hydroxyurea 500 mg Monday Wednesday Friday  Hospitalization 02/02/2018to02/09/2016:Possible PRESPosterior reversible encephalopathy syndrome(due to seizure)VS Complicated Migraine   Patient is a been experiencing multiple comorbidities. We will continue with the same dosage of hydroxyurea.  Hydrea toxicities: 1. Fatigue  2. Hair loss  3. brittle nails  Hospitalization for posterior reversible encephalopathy syndrome  I reviewed her counts and the platelet count is 465 and it is very stable since the past 1 year.  Return to clinic in23months for follow-up with labs.  No orders of the defined types were placed in this encounter.  The patient has a good  understanding of the overall plan. she agrees with it. she will call with any problems that may develop before the next visit here.   [name], MD 01/18/18

## 2018-01-18 NOTE — Assessment & Plan Note (Signed)
Essential thrombocytosis with JAK2 mutation: Continue Hydrea 500 mg daily and also aspirin 325 mg by mouth once daily.  Current dose: Hydroxyurea 500 mg Monday Wednesday Friday  Hospitalization 02/02/2018to02/09/2016:Possible PRESPosterior reversible encephalopathy syndrome(due to seizure)VS Complicated Migraine   Patient is a been experiencing multiple comorbidities. We will continue with the same dosage of hydroxyurea.  Hydrea toxicities: 1. Fatigue  2. Hair loss  3. brittle nails  Hospitalization for posterior reversible encephalopathy syndrome  I reviewed her counts and the platelet count is .  Return to clinic in91months for follow-up.

## 2018-01-20 ENCOUNTER — Other Ambulatory Visit: Payer: Medicare Other

## 2018-01-20 ENCOUNTER — Ambulatory Visit: Payer: Medicare Other | Admitting: Hematology and Oncology

## 2018-02-14 ENCOUNTER — Telehealth: Payer: Self-pay | Admitting: Neurology

## 2018-02-14 MED ORDER — PROPRANOLOL HCL 10 MG PO TABS: 10.0000 mg | ORAL_TABLET | Freq: Two times a day (BID) | ORAL | 3 refills | Status: DC

## 2018-02-14 NOTE — Telephone Encounter (Signed)
I called the patient.  The patient is not getting much benefit from low-dose clonazepam, she is also feeling tired and draggy throughout the day, we will stop the medication, try low-dose propranolol taking 10 mg twice daily, she claims that her blood pressures are running high, in the 287O to 676 systolic range.  She will call for any dose adjustments of the propranolol.

## 2018-02-14 NOTE — Addendum Note (Signed)
Addended by: Kathrynn Ducking on: 02/14/2018 12:22 PM   Modules accepted: Orders

## 2018-02-14 NOTE — Telephone Encounter (Signed)
Patient has been taking clonazePAM (KLONOPIN) 0.5 MG tablet for 6 weeks and she still has tremors with elevated BP.  Please call and discuss.

## 2018-04-02 IMAGING — MR MR MRA NECK WO/W CM
14 of 20 series · 26 of 48 positions shown · IV contrast (Yes   MULTIHANCE)
Comparison: CT HEAD April 17, 2016 at 5035 hours

CLINICAL DATA: Acute onset confusion, last seen normal at noon
today. Assess stroke. History of hyperlipidemia, tremor, essential
thrombocytosis.

EXAM:
MRI HEAD WITH AND WITHOUT CONTRAST
MRA HEAD WITHOUT CONTRAST
MRA NECK WITHOUT AND WITH CONTRAST
TECHNIQUE: Multiplanar, multiecho pulse sequences of the brain and surrounding
structures were obtained with and without intravenous contrast.
Angiographic images of the Circle of Willis were obtained using MRA
technique without intravenous contrast. Angiographic images of the
neck were obtained using MRA technique without and with intravenous
contrast. Carotid stenosis measurements (when applicable) are
obtained utilizing NASCET criteria, using the distal internal
carotid diameter as the denominator.
CONTRAST:  16 cc MultiHance

[Series 3: DWI · axial · 3.0mm · 1.09mm/px · z∈[-57,+84]mm · 4 of 96 slices shown (1 of 5)]
[im 1/96]
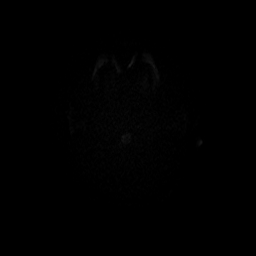
[im 32/96]
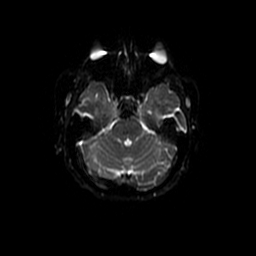
[im 64/96]
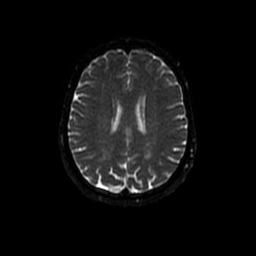
[im 96/96]
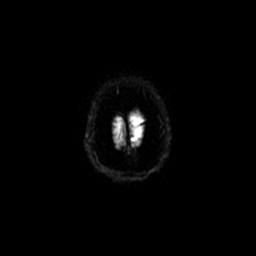

[Series 7: (id) mt fs · axial · 1.0mm · 0.43mm/px · z∈[-29,+61]mm · 7 of 182 slices shown]
[im 1/182]
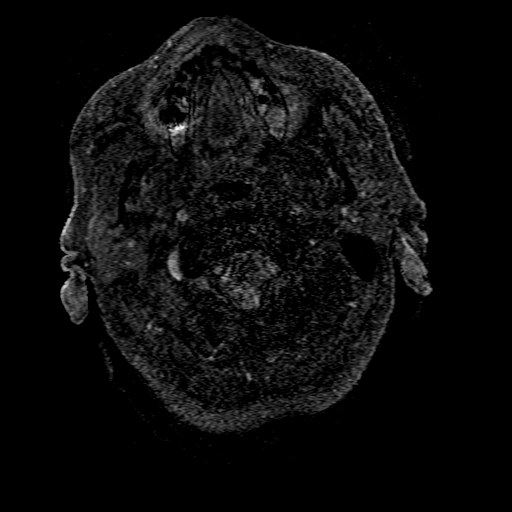
[im 31/182]
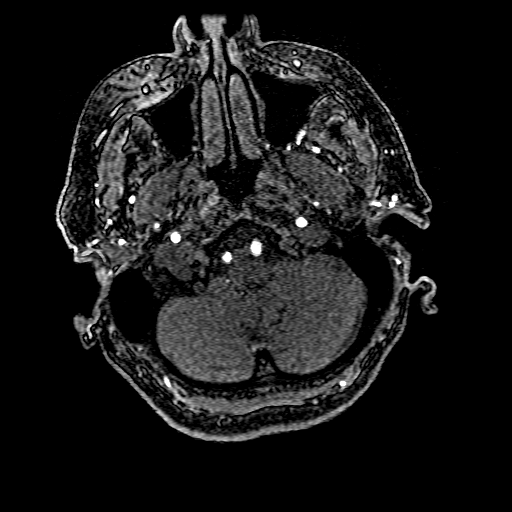
[im 61/182]
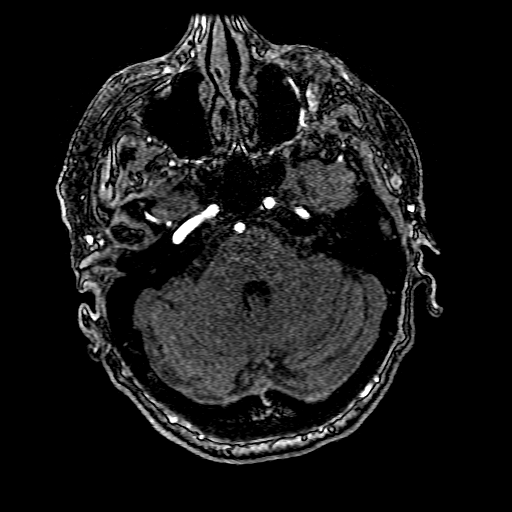
[im 91/182]
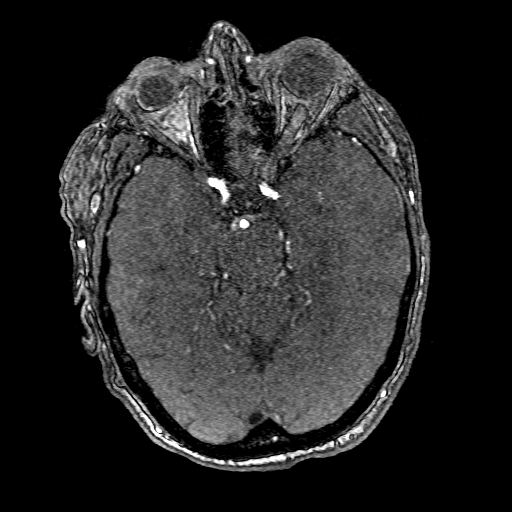
[im 121/182]
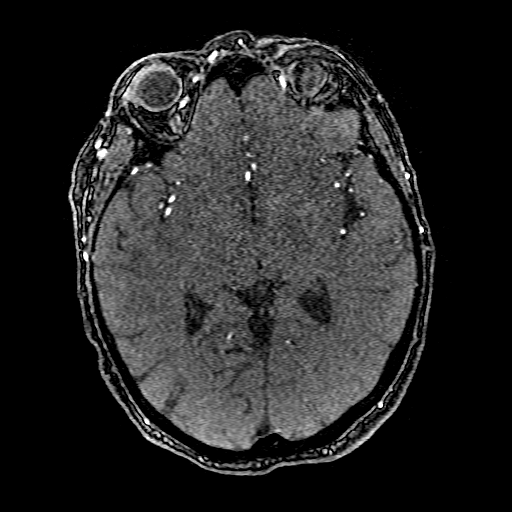
[im 151/182]
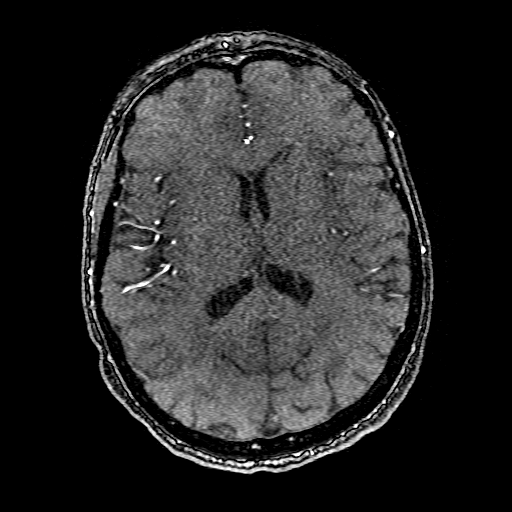
[im 182/182]
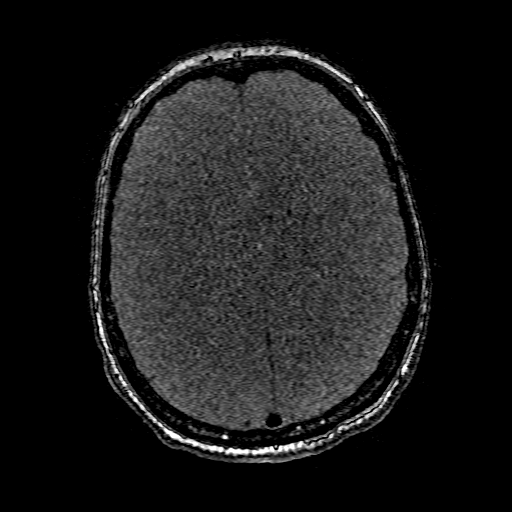

[Series 8: T2 · axial · 5.0mm · 0.43mm/px · 1 of 23 slices shown (1 of 2)]
[im 1/23]
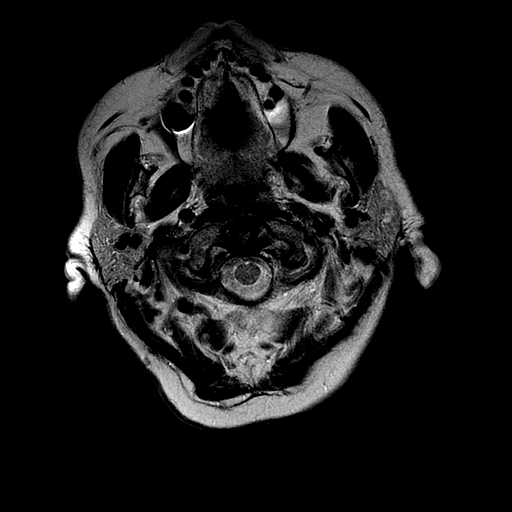

[Series 9: FLAIR · axial · 5.0mm · 0.43mm/px · 1 of 23 slices shown (1 of 2)]
[im 1/23]
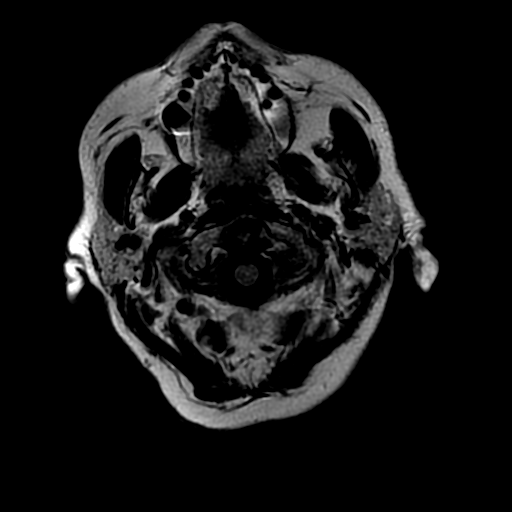

[Series 10: T2 · coronal · 5.0mm · 0.43mm/px · 1 of 27 slices shown (2 of 2)]
[im 1/27]
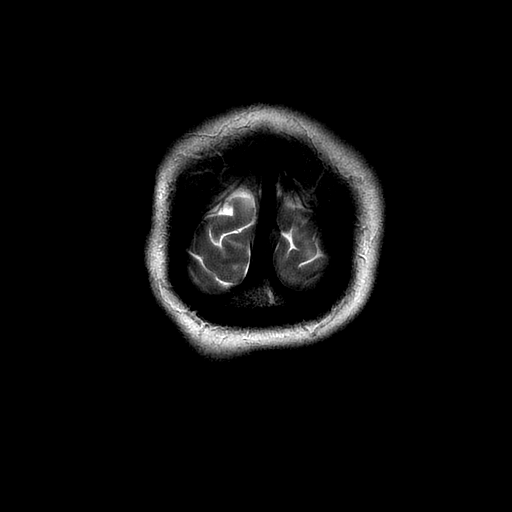

[Series 11: FLAIR · sagittal · 5.0mm · 0.47mm/px · 1 of 22 slices shown (2 of 2)]
[im 1/22]
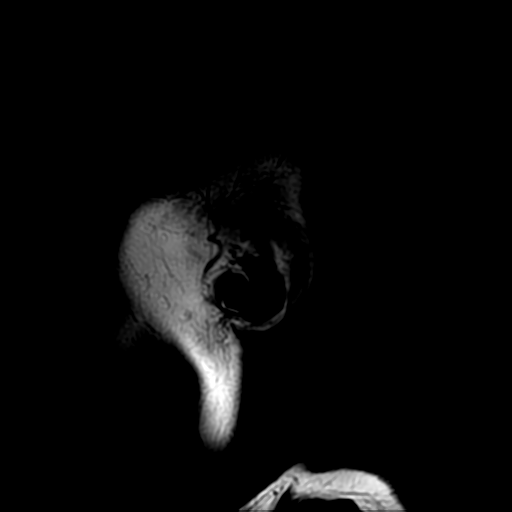

[Series 12: ax mpgr · axial · 5.0mm · 0.43mm/px · 1 of 23 slices shown]
[im 1/23]
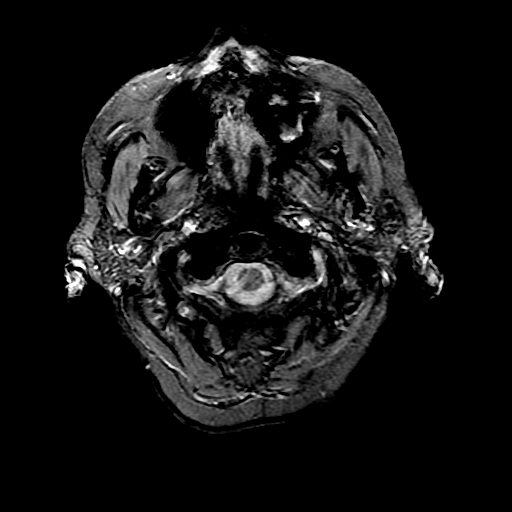

[Series 13: edwi asset · axial · 5.0mm · 1.09mm/px · z∈[-21,+110]mm · 2 of 46 slices shown]
[im 1/46]
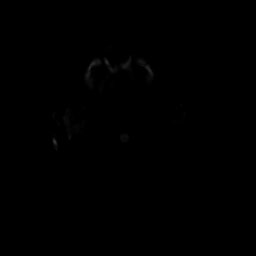
[im 46/46]
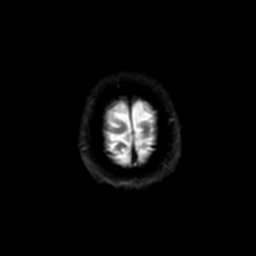

[Series 15: DWI · coronal · 5.0mm · 1.09mm/px · 2 of 64 slices shown (2 of 5)]
[im 1/64]
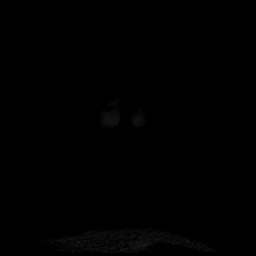
[im 64/64]
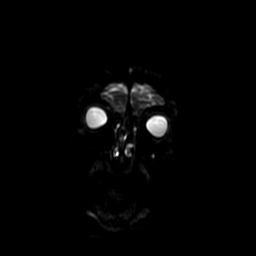

[Series 18: ax (id) · axial · 2.8mm · 0.47mm/px · 1 of 152 slices shown]
[im 1/152]
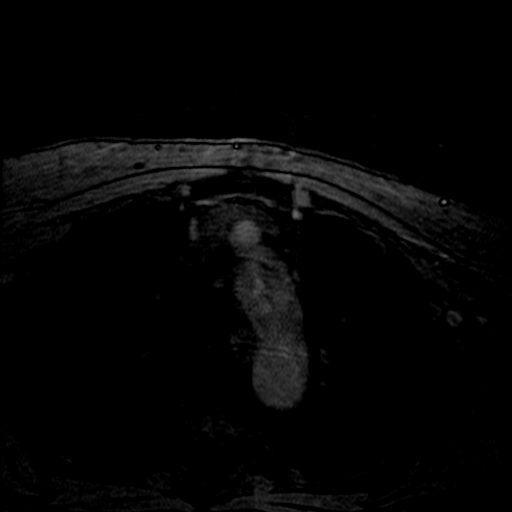

[Series 22: T1 post-contrast · coronal · 5.0mm · 0.86mm/px · 1 of 27 slices shown]
[im 1/27]
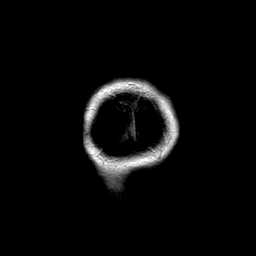

[Series 300: DWI · axial · 3.0mm · 1.09mm/px · z∈[-57,+84]mm · 2 of 48 slices shown (3 of 5)]
[im 1/48]
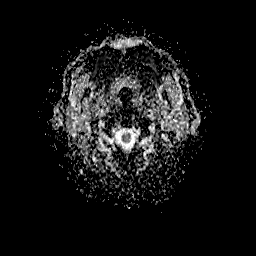
[im 48/48]
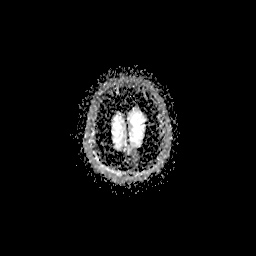

[Series 1300: DWI · axial · 5.0mm · 1.09mm/px · 1 of 23 slices shown (4 of 5)]
[im 1/23]
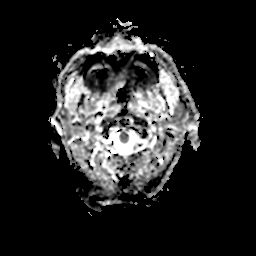

[Series 1500: DWI · coronal · 5.0mm · 1.09mm/px · 1 of 32 slices shown (5 of 5)]
[im 1/32]
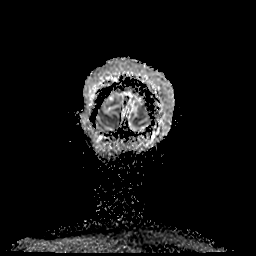

[26 of 48 positions shown; findings below may reference images not displayed]

FINDINGS: MRI HEAD FINDINGS

INTRACRANIAL CONTENTS: No reduced diffusion to suggest acute
ischemia. No susceptibility artifact to suggest hemorrhage. The
ventricles and sulci are normal for patient's age. Abnormal
enhancing FLAIR T2 hyperintense signal within the LEFT parietal and
occipital lobes extending to the cortex associated reduced diffusion
in bright ADC values consistent with T2 shine through. Additional
patchy supratentorial white matter FLAIR T2 hyperintensities. No
masses, mass effect. No abnormal extra-axial enhancement. RIGHT
parietal developmental venous anomaly, benign finding. No abnormal
extra-axial fluid collections. No extra-axial masses.

VASCULAR: Normal major intracranial vascular flow voids present at
skull base. Though not tailored for evaluation, dural venous sinuses
appear patent.

SKULL AND UPPER CERVICAL SPINE: No abnormal sellar expansion. No
suspicious calvarial bone marrow signal. Craniocervical junction
maintained. Severe LEFT temporomandibular osteoarthrosis.

SINUSES/ORBITS: LEFT maxillary mucosal retention cyst without
paranasal sinus air-fluid levels. Mild paranasal sinus mucosal
thickening. Mastoid air cells are well aerated. The included ocular
globes and orbital contents are non-suspicious. Status post
bilateral ocular lens implants.

OTHER: None.

MRA HEAD FINDINGS

ANTERIOR CIRCULATION: Normal flow related enhancement of the
included cervical, petrous, cavernous and supraclinoid internal
carotid arteries. Patent anterior communicating artery. Flow related
enhancement of the anterior and middle cerebral arteries, including
distal segments. Moderate afocal LEFT M2 origin segment.

No large vessel occlusion, high-grade stenosis, abnormal luminal
irregularity, aneurysm.

POSTERIOR CIRCULATION: vertebral artery is dominant. Basilar artery
is patent, with normal flow related enhancement of the main branch
vessels. Normal flow related enhancement of the posterior cerebral
arteries.

No large vessel occlusion, high-grade stenosis, abnormal luminal
irregularity, aneurysm.

ANATOMIC VARIANTS: None.

MRA NECK FINDINGS

ANTERIOR CIRCULATION: The common carotid arteries are widely patent
bilaterally. The carotid bifurcation is patent bilaterally and there
is no hemodynamically significant carotid stenosis by NASCET
criteria. No evidence for atherosclerosis or flow limiting stenosis
of the cervical internal carotid arteries.

POSTERIOR CIRCULATION: Bilateral vertebral arteries are patent to
the vertebrobasilar junction. No evidence for atherosclerosis or
flow limiting stenosis.

Source images and MIP image were reviewed.
IMPRESSION: MRI HEAD: Abnormal signal LEFT parietal occipital lobe with imaging
characteristics of posterior reversible encephalopathic syndrome,
less likely status epilepticus.

Moderate chronic small vessel ischemic disease.

MRA HEAD: No emergent large vessel occlusion.

Moderate stenosis LEFT M2 origin.

MRA NECK:  Negative.

## 2018-05-27 NOTE — Progress Notes (Deleted)
PATIENT: Deanna Hill DOB: 1940/12/01  REASON FOR VISIT: follow up HISTORY FROM: patient  HISTORY OF PRESENT ILLNESS: Today 05/27/18  Deanna Hill is a 42 year olde female with history of essential tremor and orthostatic hypotension. She has tried topamax, but couldn't tolerate because of cognitive side effects. Unable to tolerate primidone because dizziness, nausea. She was started on low-dose clonazepam 0.5 mg at night but unable to tolerate because of feeling tired, draggy during the day. She was started on propranolol 10 mg twice daily.     HISTORY 11/29/2017 Dr. Jannifer Franklin Deanna Hill is a 78 year old right-handed white female with a history of an essential tremor.  The patient has been on Topamax in low-dose taking 25 mg daily.  The patient reports cognitive side effects on the medication.  She in general feels poorly.  She has had issues with orthostatic hypotension, she will have good days and bad days with her blood pressure and with her tremor.  When she gets hungry, the tremor may significant worsen.  We may affect her ability to use her hands to perform tasks.  The patient has not had any blackouts since last seen, she may have episodes of dizziness where she has to hold onto something left the dizziness past.  The patient walks with a cane.  She returns for an evaluation.   REVIEW OF SYSTEMS: Out of a complete 14 system review of symptoms, the patient complains only of the following symptoms, and all other reviewed systems are negative.  ALLERGIES: Allergies  Allergen Reactions  . Iodine Shortness Of Breath and Other (See Comments)    Pt states "Deadly"  . Prednisone     Facial flushing and swelling  . Shellfish Allergy Anaphylaxis and Other (See Comments)    " I almost died once"     HOME MEDICATIONS: Outpatient Medications Prior to Visit  Medication Sig Dispense Refill  . acetaminophen (TYLENOL) 325 MG tablet Take 650 mg by mouth daily as needed for mild pain.    [name]  amLODipine (NORVASC) 5 MG tablet Take 1 tablet (5 mg total) by mouth daily.    [name] aspirin EC 81 MG tablet Take 1 tablet (81 mg total) by mouth daily. 90 tablet 3  . BIOTIN PO Take 1 capsule by mouth daily.    . Cholecalciferol (VITAMIN D) 2000 UNITS CAPS Take 1 capsule by mouth daily.    . Evening Primrose Oil 500 MG CAPS Take 1 capsule by mouth daily.     . hydrALAZINE (APRESOLINE) 25 MG tablet Take 1 tablet (25 mg total) daily as needed by mouth. If blood pressure over 062 systolic,take in the evening. (Patient not taking: Reported on 11/22/2017) 30 tablet 6  . hydroxyurea (HYDREA) 500 MG capsule Take 1 capsule (500 mg total) by mouth every Monday, Wednesday, and Friday. 36 capsule 3  . levothyroxine (SYNTHROID, LEVOTHROID) 88 MCG tablet Take 88 mcg daily by mouth.     . meclizine (ANTIVERT) 25 MG tablet Take 1 tablet (25 mg total) by mouth 3 (three) times daily as needed for dizziness. 90 tablet 0  . metroNIDAZOLE (METROCREAM) 0.75 % cream Apply 1 application topically daily as needed (for rosacea).    . nitroGLYCERIN (NITROSTAT) 0.4 MG SL tablet Place 0.4 mg under the tongue every 5 (five) minutes as needed for chest pain.    [name] propranolol (INDERAL) 10 MG tablet Take 1 tablet (10 mg total) by mouth 2 (two) times daily. 60 tablet 3  . ranitidine (ZANTAC)  150 MG capsule Take 150 mg by mouth 2 (two) times daily.      No facility-administered medications prior to visit.     PAST MEDICAL HISTORY: Past Medical History:  Diagnosis Date  . Anxiety   . Coronary artery disease, non-occlusive 05/2013   Cath for Abn Myoview => mild CAD, 30% pCx. Otw normal Coronaries.  Normlal EF.  . Essential tremor 06/19/2015  . GERD (gastroesophageal reflux disease)   . Hyperlipidemia   . Hypertension   . Hypothyroidism   . IBS (irritable bowel syndrome)   . Orthostatic hypotension 05/22/2016  . Senile calcific aortic valve sclerosis 04/2016   2D echo showed aortic sclerosis but no stenosis.  Normal wall motion.   GR 2 DD  . Thrombocytosis ([place]) 03/11/2011    PAST SURGICAL HISTORY: Past Surgical History:  Procedure Laterality Date  . ABDOMINAL HYSTERECTOMY  1979  . CAROTID DOPPLERS  01/2017   normal Carotid, Sub-Clavian & Vertebral Arteries.  [name] CATARACT EXTRACTION Bilateral 2015   Dr. Herbert Deaner  . CHOLECYSTECTOMY, LAPAROSCOPIC  2001  . LEFT HEART CATHETERIZATION WITH CORONARY ANGIOGRAM N/A 06/08/2013   Procedure: LEFT HEART CATHETERIZATION WITH CORONARY ANGIOGRAM;  Surgeon: Jacolyn Reedy, MD;  Location: [facility] CATH LAB;  Service: Cardiovascular: Dr. Wynonia Lawman: Mild CAD, 30%pCx.  Otherwise nonobstructive disease.  Normal LV function.  . TOE SURGERY Left 1994  . TONSILLECTOMY  1952  . TRANSTHORACIC ECHOCARDIOGRAM  02/'18, 11/'18   a) Normal EF of 60-65%.  Normal wall motion.  GR 2 DD.  Aortic sclerosis without stenosis;; b) Mod LVH. EF 60-65%. Ao Sclerosis w/o AS. Pseudonormal - Gr 2 DD.    FAMILY HISTORY: Family History  Problem Relation Age of Onset  . Skin cancer Mother   . Gallstones Brother   . Gallstones Brother     SOCIAL HISTORY: Social History   Socioeconomic History  . Marital status: Widowed    Spouse name: Not on file  . Number of children: Not on file  . Years of education: Not on file  . Highest education level: Not on file  Occupational History  . Not on file  Social Needs  . Financial resource strain: Not on file  . Food insecurity:    Worry: Not on file    Inability: Not on file  . Transportation needs:    Medical: Not on file    Non-medical: Not on file  Tobacco Use  . Smoking status: Never Smoker  . Smokeless tobacco: Never Used  Substance and Sexual Activity  . Alcohol use: No  . Drug use: No  . Sexual activity: Not Currently  Lifestyle  . Physical activity:    Days per week: Not on file    Minutes per session: Not on file  . Stress: Not on file  Relationships  . Social connections:    Talks on phone: Not on file    Gets together: Not on file    Attends  religious service: Not on file    Active member of club or organization: Not on file    Attends meetings of clubs or organizations: Not on file    Relationship status: Not on file  . Intimate partner violence:    Fear of current or ex partner: Not on file    Emotionally abused: Not on file    Physically abused: Not on file    Forced sexual activity: Not on file  Other Topics Concern  . Not on file  Social History Narrative   Widowed.  Lives home alone.  Retired.  12 th grade education.     \No routine exercise.   Long-term exposure to passive smoke.      In addition to her true allergies, she had a syncopal episode after starting Zoloft.  She is allergic to feathers (down).  Meloxicam causes upset stomach.  Prednisone makes her face red and hot.  Topamax causes diarrhea and gabapentin causes lightheadedness.      PHYSICAL EXAM  There were no vitals filed for this visit. There is no height or weight on file to calculate BMI.  Generalized: Well developed, in no acute distress   Neurological examination  Mentation: Alert oriented to time, place, history taking. Follows all commands speech and language fluent Cranial nerve II-XII: Pupils were equal round reactive to light. Extraocular movements were full, visual field were full on confrontational test. Facial sensation and strength were normal. Uvula tongue midline. Head turning and shoulder shrug  were normal and symmetric. Motor: The motor testing reveals 5 over 5 strength of all 4 extremities. Good symmetric motor tone is noted throughout.  Sensory: Sensory testing is intact to soft touch on all 4 extremities. No evidence of extinction is noted.  Coordination: Cerebellar testing reveals good finger-nose-finger and heel-to-shin bilaterally.  Gait and station: Gait is normal. Tandem gait is normal. Romberg is negative. No drift is seen.  Reflexes: Deep tendon reflexes are symmetric and normal bilaterally.   DIAGNOSTIC DATA (LABS,  IMAGING, TESTING) - I reviewed patient records, labs, notes, testing and imaging myself where available.  Lab Results  Component Value Date   WBC 11.7 (H) 01/18/2018   HGB 12.4 01/18/2018   HCT 40.6 01/18/2018   MCV 94.9 01/18/2018   PLT 465 (H) 01/18/2018      Component Value Date/Time   NA 137 02/07/2017 0640   NA 140 01/18/2017 1029   K 3.7 02/07/2017 0640   K 4.1 01/18/2017 1029   CL 106 02/07/2017 0640   CL 107 06/24/2012 0859   CO2 25 02/07/2017 0640   CO2 25 01/18/2017 1029   GLUCOSE 101 (H) 02/07/2017 0640   GLUCOSE 109 01/18/2017 1029   GLUCOSE 81 06/24/2012 0859   BUN 21 (H) 02/07/2017 0640   BUN 21.7 01/18/2017 1029   CREATININE 1.01 (H) 02/07/2017 0640   CREATININE 1.0 01/18/2017 1029   CALCIUM 8.6 (L) 02/07/2017 0640   CALCIUM 9.3 01/18/2017 1029   PROT 6.5 02/05/2017 0631   PROT 7.1 01/18/2017 1029   ALBUMIN 3.8 02/05/2017 0631   ALBUMIN 4.1 01/18/2017 1029   AST 15 02/05/2017 0631   AST 14 01/18/2017 1029   ALT 12 (L) 02/05/2017 0631   ALT 11 01/18/2017 1029   ALKPHOS 51 02/05/2017 0631   ALKPHOS 54 01/18/2017 1029   BILITOT 0.8 02/05/2017 0631   BILITOT 0.31 01/18/2017 1029   GFRNONAA 52 (L) 02/07/2017 0640   GFRAA >60 02/07/2017 0640   Lab Results  Component Value Date   CHOL 162 04/18/2016   HDL 44 04/18/2016   LDLCALC 106 (H) 04/18/2016   TRIG 58 04/18/2016   CHOLHDL 3.7 04/18/2016   Lab Results  Component Value Date   HGBA1C 5.9 (H) 02/05/2017   Lab Results  Component Value Date   VITAMINB12 964 04/16/2016   Lab Results  Component Value Date   TSH 1.928 02/05/2017      ASSESSMENT AND PLAN 78 y.o. year old female  has a past medical history of Anxiety, Coronary artery disease, non-occlusive (05/2013), Essential tremor (  06/19/2015), GERD (gastroesophageal reflux disease), Hyperlipidemia, Hypertension, Hypothyroidism, IBS (irritable bowel syndrome), Orthostatic hypotension (05/22/2016), Senile calcific aortic valve sclerosis (04/2016),  and Thrombocytosis ([place]) (03/11/2011). here with ***   I spent 15 minutes with the patient. 50% of this time was spent   Butler Denmark, [place], DNP 05/27/2018, 11:07 AM [facility] [address] [place], [place] [address] [phone]

## 2018-05-29 ENCOUNTER — Telehealth: Payer: Self-pay | Admitting: Neurology

## 2018-05-29 NOTE — Telephone Encounter (Signed)
I called the patient and left a message. I let her know that her appointment for tomorrow March 16th has been cancelled. Our office will reach out to reschedule.

## 2018-05-30 ENCOUNTER — Ambulatory Visit: Payer: Medicare Other | Admitting: Neurology

## 2018-05-30 ENCOUNTER — Telehealth: Payer: Self-pay | Admitting: Neurology

## 2018-05-30 ENCOUNTER — Ambulatory Visit: Payer: Medicare Other | Admitting: Nurse Practitioner

## 2018-05-30 NOTE — Telephone Encounter (Signed)
I called the patient to check in on her from today's appointment that was cancelled. She is on propanolol 10 mg twice daily for tremors. She reports that she has unable to tolerate 10 mg once daily due to side effect of feeling shaky, dizziness, feeling no control. She has been taking 5 mg at bedtime and is able to tolerate that dose. She does report some benefit to her tremors. She is okay to come back in 2-3 weeks for follow-up. She has been taking the medication since December.

## 2018-05-31 NOTE — Telephone Encounter (Signed)
Spoke with the patient and I was able to r/s her appt with Sarah for 06-15-2018 at 9:45 am.

## 2018-06-15 ENCOUNTER — Encounter: Payer: Self-pay | Admitting: Neurology

## 2018-06-15 ENCOUNTER — Ambulatory Visit (INDEPENDENT_AMBULATORY_CARE_PROVIDER_SITE_OTHER): Payer: Medicare Other | Admitting: Neurology

## 2018-06-15 ENCOUNTER — Other Ambulatory Visit: Payer: Self-pay

## 2018-06-15 DIAGNOSIS — G250 Essential tremor: Secondary | ICD-10-CM | POA: Diagnosis not present

## 2018-06-15 NOTE — Progress Notes (Signed)
I have read the note, and I agree with the clinical assessment and plan.  Charles K Willis   

## 2018-06-15 NOTE — Progress Notes (Signed)
Virtual Visit via Telephone Note  I connected with Deanna Hill on 06/15/18 at  9:45 AM EDT by telephone and verified that I am speaking with the correct person using two identifiers.   I discussed the limitations, risks, security and privacy concerns of performing an evaluation and management service by telephone and the availability of in person appointments. I also discussed with the patient that there may be a patient responsible charge related to this service. The patient expressed understanding and agreed to proceed.   History of Present Illness: 06/15/2018 SS: Deanna Hill is a 78 yo female with history of essential tremor. In the past she has tried topamax, but couldn't tolerate due to cognitive side effects. She tried primidone, but couldn't tolerate due to feeling dizzy, nauseated. She then tried clonazepam 0.5 mg at night, wasn't beneficial for tremor and caused her BP to increase. Most recently in December 2019 she started propranolol 10 mg twice daily. She felt shaky, dizzy taking propranolol twice daily. She has been taking 5 mg at bedtime, and this has been beneficial for her tremors and she can tolerate. She has issues with orthostatic hypotension.   She reports that her tremors have continued, more bothersome.  She reports she has a tremor in her right hand that is worse than the left.  She reports difficulty eating, with her handwriting.  She does live alone, is not driving at this time.  She reports she is able to perform her ADLs, sometimes cooking but difficult when measuring amounts.  She thinks she is tolerating the medication okay because she is taking it at bedtime.  She does report that she feels tired, occasionally lightheaded, woozy.  She reports about 3 weeks ago she had an episode of near syncope, was at the gas station, was moving too fast.  She does check her blood pressure twice daily.  She reports the tremor is worse when she gets nervous, anxious.  She would describe  herself as a nervous person.  She reports being very sensitive to medications, has not been able to tolerate medications in the past.  She does report continued shedding of her hair, her nails are thin and weak.  She never took the propanolol as prescribed, or twice daily.  She reports she may have a tremor in her hands at rest when she is nervous.  Sometimes she will feel like she has a tremor throughout her body, worsened by anxiety, is not visible to anyone else.  She has not had any falls.  11/29/2017 Dr .Jannifer Franklin: Deanna Hill is a 78 year old right-handed white female with a history of an essential tremor.  The patient has been on Topamax in low-dose taking 25 mg daily.  The patient reports cognitive side effects on the medication.  She in general feels poorly.  She has had issues with orthostatic hypotension, she will have good days and bad days with her blood pressure and with her tremor.  When she gets hungry, the tremor may significant worsen.  We may affect her ability to use her hands to perform tasks.  The patient has not had any blackouts since last seen, she may have episodes of dizziness where she has to hold onto something left the dizziness past. The patient walks with a cane.  She returns for an evaluation.   Observations/Objective: I reviewed her medications and history  Alert, answers questions appropriately  She reports blood pressure 131/74, HR 77 this morning  Assessment and Plan:  1.  Essential tremor  She continues to complain of a tremor in both of her hands, right is greater than the left.  She has been taking propanolol 5 mg at bedtime.  She thinks she is tolerating the medication okay.  She will try taking 5 mg twice daily.  She will monitor her blood pressure and heart rate.  In the past she has tried Topamax, primidone, clonazepam, Xanax, prescribed zonisamide however never took it because she was afraid of side effects.  She thinks that the Topamax was beneficial for her  tremor, taking 25 mg daily.  She mentioned she may want to retry this medication.  She will follow-up in 3 to 4 months in the office for a recheck on the tremor.  I advised her that if her symptoms worsen or if she develops any new symptoms she should let us know. I checked up to date, could possibly try gabapentin in the future, however would need to be low dose due to sensitivity to medications.   Follow Up Instructions: 3 to 4 months for revisit   I discussed the assessment and treatment plan with the patient. The patient was provided an opportunity to ask questions and all were answered. The patient agreed with the plan and demonstrated an understanding of the instructions.   The patient was advised to call back or seek an in-person evaluation if the symptoms worsen or if the condition fails to improve as anticipated.  I provided 30 minutes of non-face-to-face time during this encounter.   Evangeline Dakin, DNP  [facility] [address] [place], [place] [address] [phone]

## 2018-06-17 ENCOUNTER — Other Ambulatory Visit: Payer: Self-pay | Admitting: Hematology and Oncology

## 2018-06-17 DIAGNOSIS — D473 Essential (hemorrhagic) thrombocythemia: Secondary | ICD-10-CM

## 2018-07-05 ENCOUNTER — Other Ambulatory Visit: Payer: Self-pay

## 2018-07-05 ENCOUNTER — Encounter: Payer: Self-pay | Admitting: Neurology

## 2018-07-05 ENCOUNTER — Ambulatory Visit (INDEPENDENT_AMBULATORY_CARE_PROVIDER_SITE_OTHER): Payer: Medicare Other | Admitting: Neurology

## 2018-07-05 ENCOUNTER — Telehealth: Payer: Self-pay | Admitting: Neurology

## 2018-07-05 DIAGNOSIS — G250 Essential tremor: Secondary | ICD-10-CM | POA: Diagnosis not present

## 2018-07-05 MED ORDER — GABAPENTIN 100 MG PO CAPS: 100.0000 mg | ORAL_CAPSULE | Freq: Two times a day (BID) | ORAL | 5 refills | Status: DC

## 2018-07-05 NOTE — Telephone Encounter (Signed)
Pt called in and stated she is having some trouble managing her medication changes.

## 2018-07-05 NOTE — Telephone Encounter (Signed)
Patient stated that she doesn't have access to a smart phone. She has been scheduled with Sarah for 07/05/2018 at 1:45 pm to discuss her medication concerns. Verbal consent to file her insurance.

## 2018-07-05 NOTE — Progress Notes (Signed)
I have read the note, and I agree with the clinical assessment and plan.  Charles K Willis   

## 2018-07-05 NOTE — Addendum Note (Signed)
Addended by: Suzzanne Cloud on: 07/05/2018 02:16 PM   Modules accepted: Level of Service
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
## 2018-07-14 NOTE — Assessment & Plan Note (Deleted)
Essential thrombocytosis with JAK2 mutation: Continue Hydrea 500 mg daily and also aspirin 325 mg by mouth once daily. Current dose: Hydroxyurea 500 mg Monday Wednesday Friday  Hospitalization 02/02/2018to02/09/2016:Possible PRESPosterior reversible encephalopathy syndrome(due to seizure)VS Complicated Migraine   Patient has been experiencing multiple comorbidities. We will continue with the same dosage of hydroxyurea.  Hydrea toxicities: 1. Fatigue  2. Hair loss  3. brittle nails  I reviewed her counts and the platelet count is 465 and it is very stable since the past 1 year.  Return to clinic in38months for follow-up with labs.

## 2018-07-19 ENCOUNTER — Other Ambulatory Visit: Payer: Medicare Other

## 2018-07-19 ENCOUNTER — Ambulatory Visit: Payer: Medicare Other | Admitting: Hematology and Oncology

## 2018-08-01 ENCOUNTER — Other Ambulatory Visit: Payer: Self-pay | Admitting: Neurology

## 2018-08-30 NOTE — Assessment & Plan Note (Signed)
Essential thrombocytosis with JAK2 mutation: Continue Hydrea 500 mg daily and also aspirin 325 mg by mouth once daily. Current dose: Hydroxyurea 500 mg Monday Wednesday Friday  Hospitalization 02/02/2018to02/09/2016:Possible PRESPosterior reversible encephalopathy syndrome(due to seizure)VS Complicated Migraine   Patient is a been experiencing multiple comorbidities. We will continue with the same dosage of hydroxyurea.  Hydrea toxicities: 1. Fatigue  2. Hair loss  3. brittle nails  Hospitalization for posterior reversible encephalopathy syndrome  Lab review:  Return again in 6 months for follow-up with labs

## 2018-09-03 NOTE — Progress Notes (Signed)
Patient Care Team: Hulan Fess, MD as PCP - General  DIAGNOSIS:    ICD-10-CM   1. Essential thrombocytosis (HCC)  D47.3     CHIEF COMPLIANT: Follow-up of essential thrombocytosis  INTERVAL HISTORY: Deanna Hill is a 78 y.o. with above-mentioned history of essential thrombocytosis who is currently on Hydrea. I last saw her 7 month ago. She presents to the clinic today to review her labs.  She is experiencing multiple problems with tremors and she was not able to tolerate Inderal.  She was prescribed gabapentin but after she read the side effect she did not want to do it.  She has not had any major trouble with Hydrea other than some hair thinning.  REVIEW OF SYSTEMS:   Constitutional: Denies fevers, chills or abnormal weight loss Eyes: Denies blurriness of vision Ears, nose, mouth, throat, and face: Denies mucositis or sore throat Respiratory: Denies cough, dyspnea or wheezes Cardiovascular: Denies palpitation, chest discomfort Gastrointestinal: Denies nausea, heartburn or change in bowel habits Skin: Denies abnormal skin rashes Lymphatics: Denies new lymphadenopathy or easy bruising Neurological: Severe tremors and generalized weakness Behavioral/Psych: Mood is stable, no new changes  Extremities: No lower extremity edema Breast: denies any pain or lumps or nodules in either breasts All other systems were reviewed with the patient and are negative.  I have reviewed the past medical history, past surgical history, social history and family history with the patient and they are unchanged from previous note.  ALLERGIES:  is allergic to iodine; prednisone; and shellfish allergy.  MEDICATIONS:  Current Outpatient Medications  Medication Sig Dispense Refill  . acetaminophen (TYLENOL) 325 MG tablet Take 650 mg by mouth daily as needed for mild pain.    [name] amLODipine (NORVASC) 5 MG tablet Take 1 tablet (5 mg total) by mouth daily.    [name] aspirin EC 81 MG tablet Take 1 tablet (81  mg total) by mouth daily. 90 tablet 3  . BIOTIN PO Take 1 capsule by mouth daily.    . Cholecalciferol (VITAMIN D) 2000 UNITS CAPS Take 1 capsule by mouth daily.    . Evening Primrose Oil 500 MG CAPS Take 1 capsule by mouth daily.     [name] gabapentin (NEURONTIN) 100 MG capsule TAKE 1 CAPSULE BY MOUTH TWICE A DAY 180 capsule 1  . hydroxyurea (HYDREA) 500 MG capsule TAKE 1 CAPSULE BY MOUTH EVERY MONDAY, WEDNESDAY, AND FRIDAY. 36 capsule 3  . levothyroxine (SYNTHROID, LEVOTHROID) 88 MCG tablet Take 88 mcg daily by mouth.     . meclizine (ANTIVERT) 25 MG tablet Take 1 tablet (25 mg total) by mouth 3 (three) times daily as needed for dizziness. 90 tablet 0  . metroNIDAZOLE (METROCREAM) 0.75 % cream Apply 1 application topically daily as needed (for rosacea).    . nitroGLYCERIN (NITROSTAT) 0.4 MG SL tablet Place 0.4 mg under the tongue every 5 (five) minutes as needed for chest pain.    [name] propranolol (INDERAL) 10 MG tablet Take 1 tablet (10 mg total) by mouth 2 (two) times daily. (Patient not taking: Reported on 07/05/2018) 60 tablet 3   No current facility-administered medications for this visit.     PHYSICAL EXAMINATION: ECOG PERFORMANCE STATUS: 2 - Symptomatic, <50% confined to bed  Vitals:   09/05/18 1128  BP: (!) 156/70  Pulse: 99  Resp: 18  Temp: 98.7 F (37.1 C)  SpO2: 97%   Filed Weights   09/05/18 1128  Weight: 152 lb 12.8 oz (69.3 kg)    GENERAL: alert,  no distress and comfortable SKIN: skin color, texture, turgor are normal, no rashes or significant lesions EYES: normal, Conjunctiva are pink and non-injected, sclera clear OROPHARYNX: no exudate, no erythema and lips, buccal mucosa, and tongue normal  NECK: supple, thyroid normal size, non-tender, without nodularity LYMPH: no palpable lymphadenopathy in the cervical, axillary or inguinal LUNGS: clear to auscultation and percussion with normal breathing effort HEART: regular rate & rhythm and no murmurs and no lower extremity  edema ABDOMEN: abdomen soft, non-tender and normal bowel sounds MUSCULOSKELETAL: no cyanosis of digits and no clubbing  NEURO: alert & oriented x 3 with fluent speech, no focal motor/sensory deficits EXTREMITIES: No lower extremity edema  LABORATORY DATA:  I have reviewed the data as listed CMP Latest Ref Rng & Units 02/07/2017 02/06/2017 02/05/2017  Glucose 65 - 99 mg/dL 101(H) 94 97  BUN 6 - 20 mg/dL 21(H) 14 12  Creatinine 0.44 - 1.00 mg/dL 1.01(H) 0.87 0.87  Sodium 135 - 145 mmol/L 137 139 141  Potassium 3.5 - 5.1 mmol/L 3.7 3.6 3.8  Chloride 101 - 111 mmol/L 106 108 110  CO2 22 - 32 mmol/L 25 24 22   Calcium 8.9 - 10.3 mg/dL 8.6(L) 8.9 8.7(L)  Total Protein 6.5 - 8.1 g/dL - - 6.5  Total Bilirubin 0.3 - 1.2 mg/dL - - 0.8  Alkaline Phos 38 - 126 U/L - - 51  AST 15 - 41 U/L - - 15  ALT 14 - 54 U/L - - 12(L)    Lab Results  Component Value Date   WBC 8.5 09/05/2018   HGB 12.4 09/05/2018   HCT 40.1 09/05/2018   MCV 102.0 (H) 09/05/2018   PLT 469 (H) 09/05/2018   NEUTROABS 5.9 09/05/2018    ASSESSMENT & PLAN:  Essential thrombocytosis (HCC) Essential thrombocytosis with JAK2 mutation: Continue Hydrea 500 mg daily and also aspirin 325 mg by mouth once daily. Current dose: Hydroxyurea 500 mg Monday Wednesday Friday  Hospitalization 02/02/2018to02/09/2016:Possible PRESPosterior reversible encephalopathy syndrome(due to seizure)VS Complicated Migraine   Patient is a been experiencing multiple comorbidities. We will continue with the same dosage of hydroxyurea.  Hydrea toxicities: 1. Fatigue  2. Hair loss  3. brittle nails  Hospitalization for posterior reversible encephalopathy syndrome  Lab review: 09/05/2018: White count 8.5, hemoglobin 12.4, platelets 469, previously they were 465.  Return again in 6 months for follow-up with labs    No orders of the defined types were placed in this encounter.  The patient has a good understanding of the overall  plan. she agrees with it. she will call with any problems that may develop before the next visit here.  Nicholas Lose, MD 09/05/2018  Julious Oka Dorshimer am acting as scribe for Dr. Nicholas Lose.  I have reviewed the above documentation for accuracy and completeness, and I agree with the above.

## 2018-09-05 ENCOUNTER — Inpatient Hospital Stay: Payer: Medicare Other | Attending: Hematology and Oncology

## 2018-09-05 ENCOUNTER — Other Ambulatory Visit: Payer: Self-pay

## 2018-09-05 ENCOUNTER — Inpatient Hospital Stay: Payer: Medicare Other | Admitting: Hematology and Oncology

## 2018-09-05 DIAGNOSIS — Z79899 Other long term (current) drug therapy: Secondary | ICD-10-CM | POA: Insufficient documentation

## 2018-09-05 DIAGNOSIS — L603 Nail dystrophy: Secondary | ICD-10-CM

## 2018-09-05 DIAGNOSIS — R5383 Other fatigue: Secondary | ICD-10-CM | POA: Insufficient documentation

## 2018-09-05 DIAGNOSIS — L658 Other specified nonscarring hair loss: Secondary | ICD-10-CM

## 2018-09-05 DIAGNOSIS — R251 Tremor, unspecified: Secondary | ICD-10-CM | POA: Insufficient documentation

## 2018-09-05 DIAGNOSIS — D473 Essential (hemorrhagic) thrombocythemia: Secondary | ICD-10-CM | POA: Insufficient documentation

## 2018-09-05 LAB — CBC WITH DIFFERENTIAL (CANCER CENTER ONLY)
Abs Immature Granulocytes: 0.03 10*3/uL (ref 0.00–0.07)
Basophils Absolute: 0.1 10*3/uL (ref 0.0–0.1)
Basophils Relative: 1 %
Eosinophils Absolute: 1 10*3/uL — ABNORMAL HIGH (ref 0.0–0.5)
Eosinophils Relative: 12 %
HCT: 40.1 % (ref 36.0–46.0)
Hemoglobin: 12.4 g/dL (ref 12.0–15.0)
Immature Granulocytes: 0 %
Lymphocytes Relative: 10 %
Lymphs Abs: 0.9 10*3/uL (ref 0.7–4.0)
MCH: 31.6 pg (ref 26.0–34.0)
MCHC: 30.9 g/dL (ref 30.0–36.0)
MCV: 102 fL — ABNORMAL HIGH (ref 80.0–100.0)
Monocytes Absolute: 0.7 10*3/uL (ref 0.1–1.0)
Monocytes Relative: 8 %
Neutro Abs: 5.9 10*3/uL (ref 1.7–7.7)
Neutrophils Relative %: 69 %
Platelet Count: 469 10*3/uL — ABNORMAL HIGH (ref 150–400)
RBC: 3.93 MIL/uL (ref 3.87–5.11)
RDW: 13.1 % (ref 11.5–15.5)
WBC Count: 8.5 10*3/uL (ref 4.0–10.5)
nRBC: 0 % (ref 0.0–0.2)

## 2018-09-08 ENCOUNTER — Telehealth: Payer: Self-pay | Admitting: Hematology and Oncology

## 2018-09-08 NOTE — Telephone Encounter (Signed)
I talk with patient she said she couldn't talk at the moment will maill

## 2018-09-14 ENCOUNTER — Telehealth: Payer: Self-pay | Admitting: Hematology and Oncology

## 2018-09-14 NOTE — Telephone Encounter (Signed)
Patient called to reschedule  °

## 2018-10-05 ENCOUNTER — Telehealth: Payer: Self-pay | Admitting: Neurology

## 2018-10-05 NOTE — Telephone Encounter (Signed)
I called the patient. She said the gabapentin caused her a lot of problems, for 2.5 weeks she took the medication. She has tremor in both hands. She would like to retry Topamax, she has some on hand. She can start 25 mg Topamax taking at bedtime, to hopefully minimize any side effect.  In the past she is tried gabapentin,Topamax, primidone, propranolol, clonazepam, Xanax, was prescribed zonisamide (but didn't start because of fear of side effect).  All the medication she has tried she reported a variety of side effects. Hopefully this will be helpful.

## 2018-10-05 NOTE — Telephone Encounter (Signed)
Pt called in and stated she has stopped gabapentin as told by her oncology doctor, but she wants to know what she can take for her tremors

## 2018-10-05 NOTE — Telephone Encounter (Signed)
Spoke to pt.  She is having SE from gabapentin (coordination problems, muscle pain in legs, lightheadedness, worsening tremors, mood changes. Her Hemo/Onco MD told her to stop.   She took the gabapentin for for 2.5 wks and has been off for 1 wk now.  Noted is better but tremors worse.  ? Other medication or retry topamax (at the time taking it previously she was having issues with shredding nails/ hairloss (this may now have been med she was taking relating to her increased platelets).  Please advise.  (mentioned as per ofv note that DBS only true cure).

## 2018-10-24 ENCOUNTER — Telehealth: Payer: Self-pay | Admitting: Neurology

## 2018-10-24 NOTE — Telephone Encounter (Signed)
I called patient and LVM regarding rescheduling 8/21 appt due to office being closed. Requested patient call back to reschedule.

## 2018-10-25 ENCOUNTER — Encounter: Payer: Self-pay | Admitting: Neurology

## 2018-10-25 NOTE — Telephone Encounter (Signed)
8/21 appt has been cancelled. Unable to get in contact with patient. Letter sent to advise patient of cancellation.

## 2018-11-04 ENCOUNTER — Ambulatory Visit: Payer: Self-pay | Admitting: Neurology

## 2018-11-16 ENCOUNTER — Encounter: Payer: Self-pay | Admitting: Cardiology

## 2018-11-16 ENCOUNTER — Ambulatory Visit (INDEPENDENT_AMBULATORY_CARE_PROVIDER_SITE_OTHER): Payer: Medicare Other | Admitting: Cardiology

## 2018-11-16 ENCOUNTER — Other Ambulatory Visit: Payer: Self-pay

## 2018-11-16 VITALS — BP 124/73 | HR 91 | Ht 68.0 in | Wt 155.2 lb

## 2018-11-16 DIAGNOSIS — G903 Multi-system degeneration of the autonomic nervous system: Secondary | ICD-10-CM

## 2018-11-16 DIAGNOSIS — I10 Essential (primary) hypertension: Secondary | ICD-10-CM

## 2018-11-16 NOTE — Progress Notes (Signed)
PCP: Hulan Fess, MD [facility] Neurology: Dr. Jannifer Franklin Hematology-Oncology Dr. Lindi Adie She has been seen by both Dr. Wynonia Lawman and Dr. Einar Gip for cardiology --now on her third cardiologist [facility] Note: Chief Complaint  Patient presents with   Follow-up    labile BP   HPI: Deanna Hill is a 78 y.o. female with a PMH notable for Neurogenic Orthostatic Hypotension with labile HTN (has had 1 episode of PRES) & LE Deep Venous Reflux who presents for 1 yr f/u.   She has a PMH of hypertension, hypothyroidism, essential thrombocytosis as well as essential tremor and mixed dyslipidemia.   February 2018, ADMITTED for PRES (Posterior Reversible Encephalopathic Syndrome) with HTN-Crisis - SBPs well over 200 mmHg range.   Plan was Florinef & Amlodipine - then changed to midodrine - she did not take. She has noted significant swings in BP ranging from 90s/60s --> 220/120 mmhg: none as high as during her PRES episode.  She has also noted vertigo that can be debilitating since her PRES episode She was first seen here on Nov 14 - noted wide rage of BPs & profound orthostatic dizziness.  Only on Amlodipine. -> added PRN Hydralazine for SBP >180 mmHg & SL NTG if > 200 mmHg; adequate hydration, foot elevation & support stockings (she does not like to wear them).  Planned to consider Northera during f/u visit.-- Northera not covered by Insurance  Jan 29, 2017 - Syncope in setting of UTI --> IVF & Abx.  Amlodipine dose reduced to 5 mg.  June 08, 2017.  She does note her blood pressure has not been over 125mmHg.  With amlodipine at current dose well.  Required hydralazine more than twice in the interim since her last visit.  She had not been wearing support stockings because of difficulty putting them on.  Thankfully not had any further syncope or near syncopal episodes.  Questions about .Topamax dosing    ANDERSYN CONVEY was last seen in September 2019.  Plan was for 20-month follow-up,  but this was delayed due to the COVID-19.  During that visit we only decreased her aspirin 81 mg daily.  No other changes. -Her orthostatic hypotension issues seem to be relatively stable.  Not requiring PRN Midodrine.  Allowing for permissive hypertension on amlodipine (range of acceptable between SBP 130-160) -> PRN hydralazine for higher readings..  Still question the availability of Northera. --Continue to recommend support stockings, but she was not wearing them because are difficult on.  Recent Hospitalizations: none   Studies Personally Reviewed - (if available, images/films reviewed: From Epic Chart or Care Everywhere)  none  Interval History: Theta is here today with a much more notable tremor today.  She is been having blood pressures as high as 190/80 and then 3 days later 100/70 mmHg.  One time she noted a blood pressure of 194/80, rechecked it 30 minutes later and it was down to 150/70 so she did not take any medications.  She has had one episode where she almost passed out when at the gym, but could not tell me much about it just that she was unsteady and felt like she is in a fall.  Did not really sound that she actually was dizzy or near syncopal, just felt lightheaded and lost her balance.  Probably orthostatic.  Her tremor has definitely gotten a lot worse and she is tried multiple her medications.  She did not tolerate propranolol or Neurontin.  She is now on  Topamax and seems to be doing okay with it.  Just feels tired.  She seems to be tolerating it better than the other medications.  noting that she has had pretty good blood pressure recordings over the last several months.  She has had one recording over 180 that went back down to less than 160 after about an hour.  She is only had one recording as low as 110 mmHg.  She did feel dizzy with that.  She has not had any further pass out spells besides some dizziness when her blood pressure gets down into the 110-120 range.  She is  very rarely using any PRN medications for blood pressure.  Usually just rechecking her pressures will be sufficient.  She is not really noticing any significant edema and is indicated that is difficult to put on her support stockings.  For the most part, her balance is now worse.  Otherwise, she is doing well from a cardiac standpoint with no major complaints: Cardiovascular ROS: no chest pain or dyspnea on exertion positive for - Mild edema.  1 near syncopal orthostatic event.  Much more notable dizziness and tremor negative for - irregular heartbeat, orthopnea, palpitations, paroxysmal nocturnal dyspnea, rapid heart rate, shortness of breath or TIA/amaurosis fugax symptoms.  No true syncope.  No claudication.  ROS: A comprehensive was performed. Review of Systems  Constitutional: Positive for malaise/fatigue. Negative for weight loss.  HENT: Negative for congestion and nosebleeds.   Respiratory: Negative for cough and shortness of breath.   Cardiovascular: Positive for leg swelling.       Per HPI  Gastrointestinal: Negative for blood in stool and constipation.  Genitourinary: Negative for hematuria.  Musculoskeletal: Positive for falls (noted in HPI) and joint pain.  Neurological: Positive for dizziness (Not as bad), tremors (Much worse) and headaches. Negative for focal weakness and seizures.  Psychiatric/Behavioral: Positive for depression (Just seems to be very upset about her tremor getting worse and loss of balance etc.  She is very worried about her sways and blood pressures.) and memory loss. The patient is nervous/anxious and has insomnia.   All other systems reviewed and are negative.   The patient does not have symptoms concerning for COVID-19 infection (fever, chills, cough, or new shortness of breath).  The patient is practicing social distancing.   COVID-19 Education: The signs and symptoms of COVID-19 were discussed with the patient and how to seek care for testing  (follow up with PCP or arrange E-visit).   The importance of social distancing was discussed today.   I have reviewed and (if needed) personally updated the patient's problem list, medications, allergies, past medical and surgical history, social and family history.   Past Medical History:  Diagnosis Date   Anxiety    Coronary artery disease, non-occlusive 05/2013   Cath for Abn Myoview => mild CAD, 30% pCx. Otw normal Coronaries.  Normlal EF.   Essential tremor 06/19/2015   GERD (gastroesophageal reflux disease)    Hyperlipidemia    Hypertension    Hypothyroidism    IBS (irritable bowel syndrome)    Orthostatic hypotension 05/22/2016   Senile calcific aortic valve sclerosis 04/2016   2D echo showed aortic sclerosis but no stenosis.  Normal wall motion.  GR 2 DD   Thrombocytosis ([place]) 03/11/2011    Past Surgical History:  Procedure Laterality Date   ABDOMINAL HYSTERECTOMY  1979   CAROTID DOPPLERS  01/2017   normal Carotid, Sub-Clavian & Vertebral Arteries.   CATARACT EXTRACTION Bilateral 2015  Dr. Oneita Hurt, [place] WITH CORONARY ANGIOGRAM N/A 06/27/13   Procedure: LEFT HEART CATHETERIZATION WITH CORONARY ANGIOGRAM;  Surgeon: Jacolyn Reedy, MD;  Location: [facility] C[facility] LAB;  Service: Cardiovascular: Dr. Wynonia Lawman: Mild CAD, 30%pCx.  Otherwise nonobstructive disease.  Normal LV function.   TOE SURGERY Left [place]   TRANSTHORACIC ECHOCARDIOGRAM  02/'18, 11/'18   a) Normal EF of 60-65%.  Normal wall motion.  GR 2 DD.  Aortic sclerosis without stenosis;; b) Mod LVH. EF 60-65%. Ao Sclerosis w/o AS. Pseudonormal - Gr 2 DD.   Current Meds  Medication Sig   acetaminophen (TYLENOL) 325 MG tablet Take 650 mg by mouth daily as needed for mild pain.   amLODipine (NORVASC) 5 MG tablet Take 1 tablet (5 mg total) by mouth daily.   aspirin EC 81 MG tablet Take 1 tablet (81 mg total) by mouth daily.    BIOTIN PO Take 1 capsule by mouth daily.   Cholecalciferol (VITAMIN D) 2000 UNITS CAPS Take 1 capsule by mouth daily.   Evening Primrose Oil 500 MG CAPS Take 1 capsule by mouth daily.    hydroxyurea (HYDREA) 500 MG capsule TAKE 1 CAPSULE BY MOUTH EVERY MONDAY, WEDNESDAY, AND FRIDAY.   levothyroxine (SYNTHROID, LEVOTHROID) 88 MCG tablet Take 88 mcg daily by mouth.    meclizine (ANTIVERT) 25 MG tablet Take 1 tablet (25 mg total) by mouth 3 (three) times daily as needed for dizziness.   metroNIDAZOLE (METROCREAM) 0.75 % cream Apply 1 application topically daily as needed (for rosacea).   nitroGLYCERIN (NITROSTAT) 0.4 MG SL tablet Place 0.4 mg under the tongue every 5 (five) minutes as needed for chest pain.   topiramate (TOPAMAX) 25 MG tablet Take 25 mg by mouth daily.  --On 325 mg aspirin because of concern for stroke  Allergies  Allergen Reactions   Iodine Shortness Of Breath and Other (See Comments)    Pt states "Deadly"   Prednisone     Facial flushing and swelling   Shellfish Allergy Anaphylaxis and Other (See Comments)    " I almost died once"     Social History   Tobacco Use   Smoking status: Never Smoker   Smokeless tobacco: Never Used  Substance Use Topics   Alcohol use: No   Drug use: No   Social History   Social History Narrative   Widowed.  Lives home alone.  Retired.  12 th grade education.     \No routine exercise.   Long-term exposure to passive smoke.      In addition to her true allergies, she had a syncopal episode after starting Zoloft.  She is allergic to feathers (down).  Meloxicam causes upset stomach.  Prednisone makes her face red and hot.  Topamax causes diarrhea and gabapentin causes lightheadedness.  - Husband died ~spring-summer of 06-27-2016 -- very emotionally labile as a result  family history includes Gallstones in her brother and brother; Skin cancer in her mother.  Wt Readings from Last 3 Encounters:  11/16/18 155 lb 3.2 oz (70.4 kg)   09/05/18 152 lb 12.8 oz (69.3 kg)  01/18/18 152 lb 9.6 oz (69.2 kg)    PHYSICAL EXAM BP 124/73    Pulse 91    Ht 5\' 8"  (1.727 m)    Wt 155 lb 3.2 oz (70.4 kg)    SpO2 99%    BMI 23.60 kg/m  Physical Exam  Constitutional: She is oriented to  person, place, and time. She appears well-developed and well-nourished.  Appears older than stated age.  Seems very shy and timid.  Very anxious..  Well-groomed.  HENT:  Head: Normocephalic and atraumatic.  Neck: Normal range of motion. Neck supple. No JVD present.  Cardiovascular: Normal rate, regular rhythm, S1 normal, S2 normal, intact distal pulses and normal pulses.  No extrasystoles are present. PMI is not displaced. Exam reveals gallop and S4. Exam reveals no friction rub.  Murmur heard.  Medium-pitched harsh crescendo-decrescendo early systolic murmur is present with a grade of 1/6 at the upper right sternal border radiating to the neck. Pulmonary/Chest: Effort normal and breath sounds normal. No respiratory distress. She has no wheezes. She has no rales.  Abdominal: Soft. Bowel sounds are normal. She exhibits no distension. There is no abdominal tenderness. There is no rebound and no guarding.  Musculoskeletal: Normal range of motion.        General: No edema.  Neurological: She is alert and oriented to person, place, and time.  Significant right greater than left tremor of the hands worse with intention.  Skin: Skin is warm and dry. No rash noted. No erythema (fingertips).  Psychiatric: She has a normal mood and affect. Her behavior is normal. Judgment and thought content normal.  More anxious than usual.  She still answers questions, but will often defer to her daughter.  Nursing note and vitals reviewed.   Adult ECG Report Not checked  Other studies Reviewed: Additional studies/ records that were reviewed today include:  Recent Labs:   Lab Results  Component Value Date   CREATININE 1.01 (H) 02/07/2017   BUN 21 (H) 02/07/2017   NA  137 02/07/2017   K 3.7 02/07/2017   CL 106 02/07/2017   CO2 25 02/07/2017   Lab Results  Component Value Date   HGBA1C 5.9 (H) 02/05/2017   Lab Results  Component Value Date   TSH 1.928 02/05/2017   Last cholesterol by PCP April 2018: TC 172, TG 129, HDL 38, LDL 108.   ASSESSMENT / PLAN: Problem List Items Addressed This Visit    Labile essential hypertension (Chronic)   Relevant Orders   EKG 12-Lead (Completed)   Neurogenic orthostatic hypotension (HCC) - Primary (Chronic)   Relevant Orders   EKG 12-Lead (Completed)     Unfortunately, we are sort of an impact when it comes her blood pressure.  The very fact that she had a 90 mmHg swing in 24 hours makes me very fearful of being too aggressive with treating her blood pressure.  I think she did the right thing this 1 episode where she checked it at 194/80 and on recheck was 150/70.  If it does not go down in that amount of time, then she should take her as needed hydralazine.  Otherwise would leave it alone.  I do encourage her to do some type of leg blood flow.  I think probably the easiest thing to do is reclining bicycle exercises.  This would however require her to probably find a way to get into the gym or potentially purchase a reclining bike.  If nothing else, she can do the paddling exercises while seated and having her legs in front of her.  She has been tired a lot, and I think really she should take a nap in the afternoon.  Whenever she does take a nap she feels much better afterwards and is less likely to have swings in blood pressure.  This in addition to making  sure that she drinks plenty of fluid is really the most significant recommendations for her.   I spent a total of 25 minutes with the patient and chart review. >  50% of the time was spent in direct patient consultation.   Current medicines are reviewed at length with the patient today.  (+/- concerns) n/a The following changes have been made:  n/a  Patient  Instructions  Medication Instructions:  No other changes     Continue follow the direction of checking blood pressure and taking as need  hydralazine  If you need a refill on your cardiac medications before your next appointment, please call your pharmacy.   Lab work: Not needed    Testing/Procedures: Not needed Follow-Up: At [facility], you and your health needs are our priority.  As part of our continuing mission to provide you with exceptional heart care, we have created designated Provider Care Teams.  These Care Teams include your primary Cardiologist (physician) and Advanced Practice Providers (APPs -  Physician Assistants and Nurse Practitioners) who all work together to provide you with the care you need, when you need it.  You will need a follow up appointment in 12 months- SEPT 2021.  Please call our office 2 months in advance to schedule this appointment.  You may see Glenetta Hew, MD or one of the following Advanced Practice Providers on your designated Care Team:    Rosaria Ferries, PA-C  Jory Sims, DNP, ANP  Any Other Special Instructions Will Be Listed Below (If Applicable).  -CONTINUE WEARING SUPPORT STOCKING /KNEE-HI -TAKE A NAP IN THE AFTERNOON   - DO THE EXERCISE DISCUSS AT THIS OFFICE VISIT -- TOE LIFTS,  BIKING WITH LEGS  Studies Ordered:   Orders Placed This Encounter  Procedures   EKG 12-Lead      Glenetta Hew, M.D., M.S. Interventional Cardiologist   Pager # [phone] Phone # [phone] [address]. [place], [place] [address]   Thank you for choosing Heartcare at [facility]!!

## 2018-11-16 NOTE — Patient Instructions (Signed)
Medication Instructions:  No other changes     Continue follow the direction of checking blood pressure and taking as need  hydralazine  If you need a refill on your cardiac medications before your next appointment, please call your pharmacy.   Lab work: Not needed    Testing/Procedures: Not needed Follow-Up: At [facility], you and your health needs are our priority.  As part of our continuing mission to provide you with exceptional heart care, we have created designated Provider Care Teams.  These Care Teams include your primary Cardiologist (physician) and Advanced Practice Providers (APPs -  Physician Assistants and Nurse Practitioners) who all work together to provide you with the care you need, when you need it. . You will need a follow up appointment in 12 months- SEPT 2021.  Please call our office 2 months in advance to schedule this appointment.  You may see Glenetta Hew, MD or one of the following Advanced Practice Providers on your designated Care Team:   . Rosaria Ferries, PA-C . Jory Sims, DNP, ANP  Any Other Special Instructions Will Be Listed Below (If Applicable).  -CONTINUE WEARING SUPPORT STOCKING /KNEE-HI -TAKE A NAP IN THE AFTERNOON   - DO THE EXERCISE DISCUSS AT THIS OFFICE VISIT -- TOE LIFTS,  BIKING WITH LEGS

## 2018-11-23 ENCOUNTER — Encounter: Payer: Self-pay | Admitting: Cardiology
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
## 2018-11-30 ENCOUNTER — Encounter: Payer: Self-pay | Admitting: Neurology

## 2018-11-30 ENCOUNTER — Ambulatory Visit (INDEPENDENT_AMBULATORY_CARE_PROVIDER_SITE_OTHER): Payer: Medicare Other | Admitting: Neurology

## 2018-11-30 ENCOUNTER — Other Ambulatory Visit: Payer: Self-pay

## 2018-11-30 VITALS — BP 122/70 | HR 86 | Temp 97.8°F | Ht 68.0 in | Wt 152.6 lb

## 2018-11-30 DIAGNOSIS — G250 Essential tremor: Secondary | ICD-10-CM | POA: Diagnosis not present

## 2018-11-30 MED ORDER — TOPIRAMATE 25 MG PO TABS
ORAL_TABLET | ORAL | 6 refills | Status: DC
Start: 2018-11-30 — End: 2019-03-08

## 2018-11-30 NOTE — Patient Instructions (Signed)
1. Increase Topamax 12.5 mg in the morning, 25 mg at bedtime 2. Discuss with primary doctor starting antidepressant medication 3. Return in 6 months

## 2018-11-30 NOTE — Progress Notes (Signed)
I have read the note, and I agree with the clinical assessment and plan.  Gianluca Chhim K Cuahutemoc Attar   

## 2019-02-24 ENCOUNTER — Other Ambulatory Visit: Payer: Self-pay | Admitting: Cardiology

## 2019-02-24 ENCOUNTER — Telehealth: Payer: Self-pay | Admitting: Cardiology

## 2019-02-24 MED ORDER — NITROGLYCERIN 0.4 MG SL SUBL: 0.4000 mg | SUBLINGUAL_TABLET | SUBLINGUAL | 6 refills | Status: DC | PRN

## 2019-02-24 NOTE — Telephone Encounter (Signed)
*  STAT* If patient is at the pharmacy, call can be transferred to refill team.   1. Which medications need to be refilled? (please list name of each medication and dose if known)  Nitroglycerin  2. Which pharmacy/location (including street and city if local pharmacy) is medication to be sent to? [facility]  3. Do they need a 30 day or 90 day supply?  1 bottle

## 2019-02-24 NOTE — Telephone Encounter (Signed)
Informed that this is not on her medication list. nitreo refill [facility] to [facility]

## 2019-02-24 NOTE — Telephone Encounter (Signed)
New message:     Deanna Hill wants to know if pt is still taking Midodrine?

## 2019-02-28 ENCOUNTER — Other Ambulatory Visit: Payer: Self-pay | Admitting: Rehabilitation

## 2019-02-28 DIAGNOSIS — M48062 Spinal stenosis, lumbar region with neurogenic claudication: Secondary | ICD-10-CM

## 2019-03-02 MED ORDER — NITROGLYCERIN 0.4 MG SL SUBL: 0.4000 mg | SUBLINGUAL_TABLET | SUBLINGUAL | 6 refills | Status: DC | PRN

## 2019-03-02 NOTE — Telephone Encounter (Signed)
Rx(s) sent to pharmacy electronically.  

## 2019-03-07 ENCOUNTER — Ambulatory Visit: Payer: Medicare Other | Admitting: Hematology and Oncology

## 2019-03-07 ENCOUNTER — Other Ambulatory Visit: Payer: Medicare Other

## 2019-03-07 NOTE — Progress Notes (Signed)
Patient Care Team: Hulan Fess, MD as PCP - General  DIAGNOSIS:    ICD-10-CM   1. Essential thrombocytosis (HCC)  D47.3 hydroxyurea (HYDREA) 500 MG capsule    CHIEF COMPLIANT: Follow-up of essential thrombocytosis on hydrea   INTERVAL HISTORY: Deanna Hill is a 78 y.o. with above-mentioned history of essential thrombocytosis who is currently on hydrea. She presents to the clinic today to review her labs.  She has pretty profound essential tremor and all the medications that were tried have caused additional side effects and she is struggling with the back pain as well. She is losing hair and would like to decrease the Hydrea dosage.  She is taking 3 times a week.  REVIEW OF SYSTEMS:   Constitutional: Denies fevers, chills or abnormal weight loss Eyes: Denies blurriness of vision Ears, nose, mouth, throat, and face: Denies mucositis or sore throat Respiratory: Denies cough, dyspnea or wheezes Cardiovascular: Denies palpitation, chest discomfort Gastrointestinal: Denies nausea, heartburn or change in bowel habits Skin: Denies abnormal skin rashes Lymphatics: Denies new lymphadenopathy or easy bruising Neurological: Severe essential tremor Behavioral/Psych: Mood is stable, no new changes  Extremities: No lower extremity edema  All other systems were reviewed with the patient and are negative.  I have reviewed the past medical history, past surgical history, social history and family history with the patient and they are unchanged from previous note.  ALLERGIES:  is allergic to iodine; prednisone; and shellfish allergy.  MEDICATIONS:  Current Outpatient Medications  Medication Sig Dispense Refill  . acetaminophen (TYLENOL) 325 MG tablet Take 650 mg by mouth daily as needed for mild pain.    [name] amLODipine (NORVASC) 5 MG tablet Take 1 tablet (5 mg total) by mouth daily.    [name] aspirin EC 81 MG tablet Take 1 tablet (81 mg total) by mouth daily. 90 tablet 3  . BIOTIN PO Take 1  capsule by mouth daily.    . Cholecalciferol (VITAMIN D) 2000 UNITS CAPS Take 1 capsule by mouth daily.    . Evening Primrose Oil 500 MG CAPS Take 1 capsule by mouth daily.     Derrill Memo ON 03/09/2019] hydroxyurea (HYDREA) 500 MG capsule Take 1 capsule (500 mg total) by mouth 2 (two) times a week. Take 1 tab mon and 1 tab Friday 36 capsule 3  . levothyroxine (SYNTHROID, LEVOTHROID) 88 MCG tablet Take 88 mcg daily by mouth.     . meclizine (ANTIVERT) 25 MG tablet Take 1 tablet (25 mg total) by mouth 3 (three) times daily as needed for dizziness. 90 tablet 0  . metroNIDAZOLE (METROCREAM) 0.75 % cream Apply 1 application topically daily as needed (for rosacea).    . nitroGLYCERIN (NITROSTAT) 0.4 MG SL tablet Place 1 tablet (0.4 mg total) under the tongue every 5 (five) minutes as needed for chest pain. 25 tablet 6  . topiramate (TOPAMAX) 25 MG tablet take 1 tablet at bedtime 60 tablet 6   No current facility-administered medications for this visit.    PHYSICAL EXAMINATION: ECOG PERFORMANCE STATUS: 1 - Symptomatic but completely ambulatory  Vitals:   03/08/19 1354  BP: 137/90  Pulse: 91  Resp: 18  Temp: 98.2 F (36.8 C)  SpO2: 99%   Filed Weights   03/08/19 1354  Weight: 149 lb 14.4 oz (68 kg)    GENERAL: alert, no distress and comfortable SKIN: skin color, texture, turgor are normal, no rashes or significant lesions EYES: normal, Conjunctiva are pink and non-injected, sclera clear OROPHARYNX: no exudate, no  erythema and lips, buccal mucosa, and tongue normal  NECK: supple, thyroid normal size, non-tender, without nodularity LYMPH: no palpable lymphadenopathy in the cervical, axillary or inguinal LUNGS: clear to auscultation and percussion with normal breathing effort HEART: regular rate & rhythm and no murmurs and no lower extremity edema ABDOMEN: abdomen soft, non-tender and normal bowel sounds MUSCULOSKELETAL: no cyanosis of digits and no clubbing  NEURO: alert & oriented x 3  with fluent speech, no focal motor/sensory deficits EXTREMITIES: No lower extremity edema  LABORATORY DATA:  I have reviewed the data as listed CMP Latest Ref Rng & Units 02/07/2017 02/06/2017 02/05/2017  Glucose 65 - 99 mg/dL 101(H) 94 97  BUN 6 - 20 mg/dL 21(H) 14 12  Creatinine 0.44 - 1.00 mg/dL 1.01(H) 0.87 0.87  Sodium 135 - 145 mmol/L 137 139 141  Potassium 3.5 - 5.1 mmol/L 3.7 3.6 3.8  Chloride 101 - 111 mmol/L 106 108 110  CO2 22 - 32 mmol/L 25 24 22   Calcium 8.9 - 10.3 mg/dL 8.6(L) 8.9 8.7(L)  Total Protein 6.5 - 8.1 g/dL - - 6.5  Total Bilirubin 0.3 - 1.2 mg/dL - - 0.8  Alkaline Phos 38 - 126 U/L - - 51  AST 15 - 41 U/L - - 15  ALT 14 - 54 U/L - - 12(L)    Lab Results  Component Value Date   WBC 7.2 03/08/2019   HGB 12.1 03/08/2019   HCT 38.1 03/08/2019   MCV 101.3 (H) 03/08/2019   PLT 430 (H) 03/08/2019   NEUTROABS 5.0 03/08/2019    ASSESSMENT & PLAN:  Essential thrombocytosis (HCC) Essential thrombocytosis with JAK2 mutation: Continue Hydrea 500 mg daily and also aspirin 325 mg by mouth once daily. Current dose: Hydroxyurea 500 mg Monday Wednesday Friday changed to twice a week from 03/08/2019  Hospitalization 02/02/2018to02/09/2016:Possible PRESPosterior reversible encephalopathy syndrome(due to seizure)VS Complicated Migraine   Patient is a been experiencing multiple comorbidities. Because the patient is experiencing hair loss and she would like to reduce her Hydrea to twice a week.  Hydrea toxicities: 1. Fatigue  2. Hair loss  3. brittle nails  Hospitalization for posterior reversible encephalopathy syndrome  Lab review:  09/05/2018: White count 8.5, hemoglobin 12.4, platelets 469, previously they were 465. 03/08/2019: WBC 7.2, hemoglobin 12.1, platelets 430  Return again in 6 months for follow-up with labs    No orders of the defined types were placed in this encounter.  The patient has a good understanding of the overall plan.  she agrees with it. she will call with any problems that may develop before the next visit here.  Nicholas Lose, MD 03/08/2019  Deanna Hill, am acting as scribe for Dr. Nicholas Lose.  I have reviewed the above document for accuracy and completeness, and I agree with the above.

## 2019-03-08 ENCOUNTER — Inpatient Hospital Stay (HOSPITAL_BASED_OUTPATIENT_CLINIC_OR_DEPARTMENT_OTHER): Payer: Medicare Other | Admitting: Hematology and Oncology

## 2019-03-08 ENCOUNTER — Other Ambulatory Visit: Payer: Self-pay

## 2019-03-08 ENCOUNTER — Telehealth: Payer: Self-pay | Admitting: Hematology and Oncology

## 2019-03-08 ENCOUNTER — Inpatient Hospital Stay: Payer: Medicare Other | Attending: Hematology and Oncology

## 2019-03-08 DIAGNOSIS — G250 Essential tremor: Secondary | ICD-10-CM | POA: Insufficient documentation

## 2019-03-08 DIAGNOSIS — D473 Essential (hemorrhagic) thrombocythemia: Secondary | ICD-10-CM | POA: Insufficient documentation

## 2019-03-08 LAB — CBC WITH DIFFERENTIAL (CANCER CENTER ONLY)
Abs Immature Granulocytes: 0.02 10*3/uL (ref 0.00–0.07)
Basophils Absolute: 0.1 10*3/uL (ref 0.0–0.1)
Basophils Relative: 1 %
Eosinophils Absolute: 0.7 10*3/uL — ABNORMAL HIGH (ref 0.0–0.5)
Eosinophils Relative: 10 %
HCT: 38.1 % (ref 36.0–46.0)
Hemoglobin: 12.1 g/dL (ref 12.0–15.0)
Immature Granulocytes: 0 %
Lymphocytes Relative: 12 %
Lymphs Abs: 0.9 10*3/uL (ref 0.7–4.0)
MCH: 32.2 pg (ref 26.0–34.0)
MCHC: 31.8 g/dL (ref 30.0–36.0)
MCV: 101.3 fL — ABNORMAL HIGH (ref 80.0–100.0)
Monocytes Absolute: 0.5 10*3/uL (ref 0.1–1.0)
Monocytes Relative: 7 %
Neutro Abs: 5 10*3/uL (ref 1.7–7.7)
Neutrophils Relative %: 70 %
Platelet Count: 430 10*3/uL — ABNORMAL HIGH (ref 150–400)
RBC: 3.76 MIL/uL — ABNORMAL LOW (ref 3.87–5.11)
RDW: 13.7 % (ref 11.5–15.5)
WBC Count: 7.2 10*3/uL (ref 4.0–10.5)
nRBC: 0 % (ref 0.0–0.2)

## 2019-03-08 MED ORDER — HYDROXYUREA 500 MG PO CAPS: 500.0000 mg | ORAL_CAPSULE | ORAL | 3 refills | Status: DC

## 2019-03-08 MED ORDER — TOPIRAMATE 25 MG PO TABS: ORAL_TABLET | ORAL | 6 refills | Status: DC

## 2019-03-08 NOTE — Telephone Encounter (Signed)
I could not reach patient regarding schedule  °

## 2019-03-08 NOTE — Assessment & Plan Note (Signed)
Essential thrombocytosis with JAK2 mutation: Continue Hydrea 500 mg daily and also aspirin 325 mg by mouth once daily. Current dose: Hydroxyurea 500 mg Monday Wednesday Friday  Hospitalization 02/02/2018to02/09/2016:Possible PRESPosterior reversible encephalopathy syndrome(due to seizure)VS Complicated Migraine   Patient is a been experiencing multiple comorbidities. We will continue with the same dosage of hydroxyurea.  Hydrea toxicities: 1. Fatigue  2. Hair loss  3. brittle nails  Hospitalization for posterior reversible encephalopathy syndrome  Lab review:  09/05/2018: White count 8.5, hemoglobin 12.4, platelets 469, previously they were 465. 03/08/2019:  Return again in 6 months for follow-up with labs

## 2019-03-22 ENCOUNTER — Ambulatory Visit
Admission: RE | Admit: 2019-03-22 | Discharge: 2019-03-22 | Disposition: A | Discharge status: Home / Self Care | Payer: Medicare Other | Source: Ambulatory Visit | Attending: Rehabilitation | Admitting: Rehabilitation

## 2019-03-22 ENCOUNTER — Other Ambulatory Visit: Payer: Self-pay

## 2019-03-22 DIAGNOSIS — M48062 Spinal stenosis, lumbar region with neurogenic claudication: Secondary | ICD-10-CM

## 2019-06-01 ENCOUNTER — Ambulatory Visit: Payer: Medicare Other | Admitting: Neurology

## 2019-07-20 ENCOUNTER — Other Ambulatory Visit: Payer: Self-pay | Admitting: Hematology and Oncology

## 2019-07-20 DIAGNOSIS — D473 Essential (hemorrhagic) thrombocythemia: Secondary | ICD-10-CM

## 2019-09-05 ENCOUNTER — Other Ambulatory Visit: Payer: Self-pay | Admitting: *Deleted

## 2019-09-05 DIAGNOSIS — D473 Essential (hemorrhagic) thrombocythemia: Secondary | ICD-10-CM

## 2019-09-05 NOTE — Progress Notes (Signed)
Patient Care Team: Hulan Fess, MD as PCP - General  DIAGNOSIS:    ICD-10-CM   1. Essential thrombocytosis (HCC)  D47.3 CBC with Differential (Cancer Center Only)    CHIEF COMPLIANT: Follow-up of essential thrombocytosis on hydrea   INTERVAL HISTORY: Deanna Hill is a 79 y.o. with above-mentioned history of essential thrombocytosiswhois currently on hydrea.She presents to the clinic todayto review her labs. Patient tells me that she has been struggling because of pain issues and also worsening tremors.  She had cut down on the hydroxyurea from 3 times a week to twice a week.  Since then platelet count slightly gotten worse.  She also complains of severe fatigue although her hemoglobin is normal.  ALLERGIES:  is allergic to iodine, prednisone, and shellfish allergy.  MEDICATIONS:  Current Outpatient Medications  Medication Sig Dispense Refill  . acetaminophen (TYLENOL) 325 MG tablet Take 650 mg by mouth daily as needed for mild pain.    [name] amLODipine (NORVASC) 5 MG tablet Take 1 tablet (5 mg total) by mouth daily.    [name] aspirin EC 81 MG tablet Take 1 tablet (81 mg total) by mouth daily. 90 tablet 3  . BIOTIN PO Take 1 capsule by mouth daily.    . Cholecalciferol (VITAMIN D) 2000 UNITS CAPS Take 1 capsule by mouth daily.    . Evening Primrose Oil 500 MG CAPS Take 1 capsule by mouth daily.     . hydroxyurea (HYDREA) 500 MG capsule TAKE ONE CAPSULE BY MOUTH EVERY MONDAY, WEDNESDAY, AND friday 36 capsule 2  . levothyroxine (SYNTHROID, LEVOTHROID) 88 MCG tablet Take 88 mcg daily by mouth.     . meclizine (ANTIVERT) 25 MG tablet Take 1 tablet (25 mg total) by mouth 3 (three) times daily as needed for dizziness. 90 tablet 0  . metroNIDAZOLE (METROCREAM) 0.75 % cream Apply 1 application topically daily as needed (for rosacea).    . nitroGLYCERIN (NITROSTAT) 0.4 MG SL tablet Place 1 tablet (0.4 mg total) under the tongue every 5 (five) minutes as needed for chest pain. 25 tablet 6  .  topiramate (TOPAMAX) 25 MG tablet take 1 tablet at bedtime 60 tablet 6   No current facility-administered medications for this visit.    PHYSICAL EXAMINATION: ECOG PERFORMANCE STATUS: 1 - Symptomatic but completely ambulatory  Vitals:   09/06/19 1335  BP: (!) 130/59  Pulse: 94  Resp: 17  Temp: 99.2 F (37.3 C)  SpO2: 100%   Filed Weights   09/06/19 1335  Weight: 148 lb 14.4 oz (67.5 kg)    LABORATORY DATA:  I have reviewed the data as listed CMP Latest Ref Rng & Units 02/07/2017 02/06/2017 02/05/2017  Glucose 65 - 99 mg/dL 101(H) 94 97  BUN 6 - 20 mg/dL 21(H) 14 12  Creatinine 0.44 - 1.00 mg/dL 1.01(H) 0.87 0.87  Sodium 135 - 145 mmol/L 137 139 141  Potassium 3.5 - 5.1 mmol/L 3.7 3.6 3.8  Chloride 101 - 111 mmol/L 106 108 110  CO2 22 - 32 mmol/L 25 24 22   Calcium 8.9 - 10.3 mg/dL 8.6(L) 8.9 8.7(L)  Total Protein 6.5 - 8.1 g/dL - - 6.5  Total Bilirubin 0.3 - 1.2 mg/dL - - 0.8  Alkaline Phos 38 - 126 U/L - - 51  AST 15 - 41 U/L - - 15  ALT 14 - 54 U/L - - 12(L)    Lab Results  Component Value Date   WBC 9.3 09/06/2019   HGB 12.3 09/06/2019  HCT 39.5 09/06/2019   MCV 101.3 (H) 09/06/2019   PLT 541 (H) 09/06/2019   NEUTROABS 6.6 09/06/2019    ASSESSMENT & PLAN:  Essential thrombocytosis (HCC) Essential thrombocytosis with JAK2 mutation: Continue Hydrea 500 mg daily and also aspirin 325 mg by mouth once daily. Current dose: Hydroxyurea 500 mg Monday Wednesday Friday changed to twice a week from 03/08/2019  Hospitalization 02/02/2018to02/09/2016:Possible PRESPosterior reversible encephalopathy syndrome(due to seizure)VS Complicated Migraine   Patient is a been experiencing multiple comorbidities. Because the patient is experiencing hair loss and she reduced Hydrea to twice a week.  Hydrea toxicities: 1. Fatigue  2. Hair loss  3. brittle nails  Hospitalization for posterior reversible encephalopathy syndrome  Lab review: 09/05/2018: White  count 8.5, hemoglobin 12.4, platelets 469, previously they were 465. 03/08/2019: WBC 7.2, hemoglobin 12.1, platelets 430 09/06/2019: WBC 9.3, MCV 101.3, platelets 541  We decided to continue with the twice a week hydroxyurea. Return again in 6 months for follow-up with labs    Orders Placed This Encounter  Procedures  . CBC with Differential (Cancer Center Only)    Standing Status:   Future    Standing Expiration Date:   09/05/2020   The patient has a good understanding of the overall plan. she agrees with it. she will call with any problems that may develop before the next visit here.  Total time spent: 20 mins including face to face time and time spent for planning, charting and coordination of care  Nicholas Lose, MD 09/06/2019  I, Cloyde Reams Dorshimer, am acting as scribe for Dr. Nicholas Lose.  I have reviewed the above documentation for accuracy and completeness, and I agree with the above.

## 2019-09-06 ENCOUNTER — Telehealth: Payer: Self-pay | Admitting: Hematology and Oncology

## 2019-09-06 ENCOUNTER — Other Ambulatory Visit: Payer: Self-pay

## 2019-09-06 ENCOUNTER — Inpatient Hospital Stay: Payer: Medicare Other | Attending: Hematology and Oncology | Admitting: Hematology and Oncology

## 2019-09-06 ENCOUNTER — Inpatient Hospital Stay: Payer: Medicare Other

## 2019-09-06 DIAGNOSIS — R251 Tremor, unspecified: Secondary | ICD-10-CM | POA: Diagnosis not present

## 2019-09-06 DIAGNOSIS — L659 Nonscarring hair loss, unspecified: Secondary | ICD-10-CM | POA: Insufficient documentation

## 2019-09-06 DIAGNOSIS — R5383 Other fatigue: Secondary | ICD-10-CM | POA: Insufficient documentation

## 2019-09-06 DIAGNOSIS — Z79899 Other long term (current) drug therapy: Secondary | ICD-10-CM | POA: Diagnosis not present

## 2019-09-06 DIAGNOSIS — D473 Essential (hemorrhagic) thrombocythemia: Secondary | ICD-10-CM | POA: Diagnosis not present

## 2019-09-06 DIAGNOSIS — L603 Nail dystrophy: Secondary | ICD-10-CM | POA: Diagnosis not present

## 2019-09-06 LAB — CBC WITH DIFFERENTIAL (CANCER CENTER ONLY)
Abs Immature Granulocytes: 0.03 10*3/uL (ref 0.00–0.07)
Basophils Absolute: 0.1 10*3/uL (ref 0.0–0.1)
Basophils Relative: 2 %
Eosinophils Absolute: 1 10*3/uL — ABNORMAL HIGH (ref 0.0–0.5)
Eosinophils Relative: 11 %
HCT: 39.5 % (ref 36.0–46.0)
Hemoglobin: 12.3 g/dL (ref 12.0–15.0)
Immature Granulocytes: 0 %
Lymphocytes Relative: 11 %
Lymphs Abs: 1 10*3/uL (ref 0.7–4.0)
MCH: 31.5 pg (ref 26.0–34.0)
MCHC: 31.1 g/dL (ref 30.0–36.0)
MCV: 101.3 fL — ABNORMAL HIGH (ref 80.0–100.0)
Monocytes Absolute: 0.6 10*3/uL (ref 0.1–1.0)
Monocytes Relative: 6 %
Neutro Abs: 6.6 10*3/uL (ref 1.7–7.7)
Neutrophils Relative %: 70 %
Platelet Count: 541 10*3/uL — ABNORMAL HIGH (ref 150–400)
RBC: 3.9 MIL/uL (ref 3.87–5.11)
RDW: 13.3 % (ref 11.5–15.5)
WBC Count: 9.3 10*3/uL (ref 4.0–10.5)
nRBC: 0 % (ref 0.0–0.2)

## 2019-09-06 NOTE — Assessment & Plan Note (Signed)
Essential thrombocytosis with JAK2 mutation: Continue Hydrea 500 mg daily and also aspirin 325 mg by mouth once daily. Current dose: Hydroxyurea 500 mg Monday Wednesday Friday changed to twice a week from 03/08/2019  Hospitalization 02/02/2018to02/09/2016:Possible PRESPosterior reversible encephalopathy syndrome(due to seizure)VS Complicated Migraine   Patient is a been experiencing multiple comorbidities. Because the patient is experiencing hair loss and she would like to reduce her Hydrea to twice a week.  Hydrea toxicities: 1. Fatigue  2. Hair loss  3. brittle nails  Hospitalization for posterior reversible encephalopathy syndrome  Lab review: 09/05/2018: White count 8.5, hemoglobin 12.4, platelets 469, previously they were 465. 03/08/2019: WBC 7.2, hemoglobin 12.1, platelets 430 09/06/2019:  Return again in 6 months for follow-up with labs

## 2019-09-06 NOTE — Telephone Encounter (Signed)
Scheduled appts per 6/23 los. Gave pt a print out of AVS.

## 2019-10-05 ENCOUNTER — Other Ambulatory Visit: Payer: Self-pay

## 2019-10-05 ENCOUNTER — Ambulatory Visit: Payer: Medicare Other | Admitting: Neurology

## 2019-10-05 ENCOUNTER — Encounter: Payer: Self-pay | Admitting: Neurology

## 2019-10-05 VITALS — BP 150/76 | HR 86 | Ht 68.0 in | Wt 147.0 lb

## 2019-10-05 DIAGNOSIS — G459 Transient cerebral ischemic attack, unspecified: Secondary | ICD-10-CM | POA: Diagnosis not present

## 2019-10-05 MED ORDER — TOPIRAMATE ER 50 MG PO SPRINKLE CAP24: 50.0000 mg | EXTENDED_RELEASE_CAPSULE | Freq: Every day | ORAL | 3 refills | Status: DC

## 2019-10-05 NOTE — Patient Instructions (Signed)
Stop the Topiramate 25 mg, we will start Qudexy 50 mg at night.

## 2019-10-05 NOTE — Progress Notes (Signed)
Reason for visit: Essential tremor  Deanna Hill is an 79 y.o. female  History of present illness:  Deanna Hill is an 79 year old right-handed white female with history of an essential tremor for approximately 25 years.  The patient has had gradual worsening of the tremor over time.  She has tremors affecting the upper extremities in a symmetric fashion and some jaw tremor.  She has been on a multitude of medications without tolerance or effectiveness, she has gained some benefit with low-dose Topamax.  She is on 25 mg at night but has some early morning nausea from this, she is still having difficulty with handwriting and feeding herself at times.  She has had some low back pain and is seen through a spine specialist, she recently got an injection in her low back.  She had some vertigo for several days following the injection, she has had vertigo previously.  She had a history of migraine headaches when she was younger, she had episodes of vertigo in her 80s and off and on throughout her life.  She has meclizine to take, she claims the vertigo has improved over the last day and a half.  Four days ago, she had a transient episode lasting 15 minutes with numbness in the ulnar aspect of her left hand, the patient was walking at the time, she was not leaning on the elbow or flexing the elbow.  It is not clear whether the hand was weak or not, but the entire thing resolved in about 15 minutes.  She reported no numbness or weakness of the face or the leg.  The patient has a prior history of PRES.  She returns to the office today for an evaluation.  Past Medical History:  Diagnosis Date  . Anxiety   . Coronary artery disease, non-occlusive 05/2013   Cath for Abn Myoview => mild CAD, 30% pCx. Otw normal Coronaries.  Normlal EF.  . Essential tremor 06/19/2015  . GERD (gastroesophageal reflux disease)   . Hyperlipidemia   . Hypertension   . Hypothyroidism   . IBS (irritable bowel syndrome)   .  Orthostatic hypotension 05/22/2016  . Senile calcific aortic valve sclerosis 04/2016   2D echo showed aortic sclerosis but no stenosis.  Normal wall motion.  GR 2 DD  . Thrombocytosis (Success) 03/11/2011    Past Surgical History:  Procedure Laterality Date  . ABDOMINAL HYSTERECTOMY  1979  . CAROTID DOPPLERS  01/2017   normal Carotid, Sub-Clavian & Vertebral Arteries.  [name] CATARACT EXTRACTION Bilateral 2015   Dr. Herbert Deaner  . CHOLECYSTECTOMY, LAPAROSCOPIC  2001  . LEFT HEART CATHETERIZATION WITH CORONARY ANGIOGRAM N/A 06/08/2013   Procedure: LEFT HEART CATHETERIZATION WITH CORONARY ANGIOGRAM;  Surgeon: Jacolyn Reedy, MD;  Location: [facility] CATH LAB;  Service: Cardiovascular: Dr. Wynonia Lawman: Mild CAD, 30%pCx.  Otherwise nonobstructive disease.  Normal LV function.  . TOE SURGERY Left 1994  . TONSILLECTOMY  1952  . TRANSTHORACIC ECHOCARDIOGRAM  02/'18, 11/'18   a) Normal EF of 60-65%.  Normal wall motion.  GR 2 DD.  Aortic sclerosis without stenosis;; b) Mod LVH. EF 60-65%. Ao Sclerosis w/o AS. Pseudonormal - Gr 2 DD.    Family History  Problem Relation Age of Onset  . Skin cancer Mother   . Gallstones Brother   . Gallstones Brother     Social history:  reports that she has never smoked. She has never used smokeless tobacco. She reports that she does not drink alcohol and does not use  drugs.    Allergies  Allergen Reactions  . Iodine Shortness Of Breath and Other (See Comments)    Pt states "Deadly"  . Prednisone     Facial flushing and swelling  . Shellfish Allergy Anaphylaxis and Other (See Comments)    " I almost died once"     Medications:  Prior to Admission medications   Medication Sig Start Date End Date Taking? Authorizing Provider  acetaminophen (TYLENOL) 325 MG tablet Take 650 mg by mouth daily as needed for mild pain.   Yes [provider]  amLODipine (NORVASC) 5 MG tablet Take 1 tablet (5 mg total) by mouth daily. 02/07/17  Yes Alma Friendly, MD  aspirin EC 81  MG tablet Take 1 tablet (81 mg total) by mouth daily. 11/22/17  Yes Leonie Man, MD  BIOTIN PO Take 1 capsule by mouth daily.   Yes [provider]  Cholecalciferol (VITAMIN D) 2000 UNITS CAPS Take 1 capsule by mouth daily.   Yes [provider]  hydroxyurea (HYDREA) 500 MG capsule TAKE ONE CAPSULE BY MOUTH EVERY MONDAY, WEDNESDAY, AND friday Patient taking differently: Pt taking twice a week 07/20/19  Yes Nicholas Lose, MD  levothyroxine (SYNTHROID, LEVOTHROID) 88 MCG tablet Take 88 mcg daily by mouth.    Yes [provider]  meclizine (ANTIVERT) 25 MG tablet Take 1 tablet (25 mg total) by mouth 3 (three) times daily as needed for dizziness. 03/01/17  Yes Leonie Man, MD  metroNIDAZOLE (METROCREAM) 0.75 % cream Apply 1 application topically daily as needed (for rosacea).   Yes [provider]  nitroGLYCERIN (NITROSTAT) 0.4 MG SL tablet Place 1 tablet (0.4 mg total) under the tongue every 5 (five) minutes as needed for chest pain. 03/02/19  Yes Leonie Man, MD  topiramate (TOPAMAX) 25 MG tablet take 1 tablet at bedtime 03/08/19  Yes Nicholas Lose, MD    ROS:  Out of a complete 14 system review of symptoms, the patient complains only of the following symptoms, and all other reviewed systems are negative.  Tremor Transient hand numbness Low back pain  Blood pressure (!) 150/76, pulse 86, height 5\' 8"  (1.727 m), weight 147 lb (66.7 kg).  Physical Exam  General: The patient is alert and cooperative at the time of the examination.  Skin: No significant peripheral edema is noted.   Neurologic Exam  Mental status: The patient is alert and oriented x 3 at the time of the examination. The patient has apparent normal recent and remote memory, with an apparently normal attention span and concentration ability.   Cranial nerves: Facial symmetry is present. Speech is normal, no aphasia or dysarthria is noted. Extraocular movements are full. Visual  fields are full.  Some masking of the face is seen, voice is soft.  Motor: The patient has good strength in all 4 extremities.  Sensory examination: Soft touch sensation is symmetric on the face, arms, and legs.  Coordination: The patient has good finger-nose-finger and heel-to-shin bilaterally.  The patient has prominent tremors of the upper extremities with finger-nose-finger, and with rest.  The tremors are symmetric 1 side of the neck.  With drawing a spiral, there is tremor translated into the handwriting.  Gait and station: The patient has a stooped posture, she can walk independently but usually uses a cane.  Gait is slightly wide-based.  Tandem gait is not attempted.  Romberg is negative.  Reflexes: Deep tendon reflexes are symmetric.   Assessment/Plan:  1.  Essential tremor  2.  Episodic vertigo  3.  Low back pain  4.  Transient left hand numbness  The episode of left hand numbness is of unclear etiology, could potentially have represented a TIA.  We will check a CT of the head and carotid Doppler study.  The patient is on low-dose aspirin.  She will be converted to Qudexy moving to 50 mg at night for the tremor, we discussed the possibility of a referral for consideration for a deep brain stimulator.  The patient will let me know if the vertigo returns, I may consider vestibular rehabilitation.  She has had episodes of vertigo throughout most of her adult life.  She will follow up here in about 6 months otherwise.  Jill Alexanders MD 10/05/2019 9:06 AM  [facility] [address] [place], [place] [address]  Phone [phone] Fax [phone]

## 2019-10-06 ENCOUNTER — Telehealth: Payer: Self-pay | Admitting: Neurology

## 2019-10-06 NOTE — Telephone Encounter (Signed)
PA has been submitted.   (Key: [id number])  Your information has been sent to [facility]. KeyArtis Flock - PA Case ID: [id number] - Rx #: [id number]

## 2019-10-06 NOTE — Telephone Encounter (Signed)
(  Key: [id number])  This request has received a Favorable outcome.  Please note any additional information provided by [facility] at the bottom of your screen.  You will also receive a faxed copy of the determination.

## 2019-10-06 NOTE — Telephone Encounter (Signed)
Received call from Pharmacy Tech Jon Gills) from [facility]  prior authorization yesterday to Dr. Jannifer Franklin for topiramate ER (QUDEXY XR) 50 MG CS24 sprinkle capsule. Also sent through Cover My Meds.

## 2019-10-07 ENCOUNTER — Telehealth: Payer: Self-pay | Admitting: Neurology

## 2019-10-07 NOTE — Telephone Encounter (Signed)
[employer] medicare order sent to [facility]. No auth they will reach out to the patient to schedule.  

## 2019-10-10 ENCOUNTER — Ambulatory Visit (HOSPITAL_COMMUNITY)
Admission: RE | Admit: 2019-10-10 | Discharge: 2019-10-10 | Disposition: A | Discharge status: Home / Self Care | Payer: Medicare Other | Source: Ambulatory Visit | Attending: Neurology | Admitting: Neurology

## 2019-10-10 ENCOUNTER — Other Ambulatory Visit: Payer: Self-pay

## 2019-10-10 DIAGNOSIS — G459 Transient cerebral ischemic attack, unspecified: Secondary | ICD-10-CM | POA: Insufficient documentation

## 2019-10-11 ENCOUNTER — Telehealth: Payer: Self-pay | Admitting: Neurology

## 2019-10-11 NOTE — Telephone Encounter (Signed)
  I called the patient.  The carotid Doppler study is unremarkable, CT of the brain is to be done tomorrow.   Carotid doppler 10/11/19:  Summary: Right Carotid: Velocities in the right ICA are consistent with a 1-39% stenosis.  Left Carotid: Velocities in the left ICA are consistent with a 1-39% stenosis.  Vertebrals:  Bilateral vertebral arteries demonstrate antegrade flow. Subclavians: Normal flow hemodynamics were seen in bilateral subclavian              arteries.

## 2019-10-12 ENCOUNTER — Other Ambulatory Visit: Payer: Self-pay

## 2019-10-12 ENCOUNTER — Ambulatory Visit
Admission: RE | Admit: 2019-10-12 | Discharge: 2019-10-12 | Disposition: A | Discharge status: Home / Self Care | Payer: Medicare Other | Source: Ambulatory Visit | Attending: Neurology | Admitting: Neurology

## 2019-10-12 DIAGNOSIS — G459 Transient cerebral ischemic attack, unspecified: Secondary | ICD-10-CM | POA: Diagnosis not present

## 2019-10-13 ENCOUNTER — Telehealth: Payer: Self-pay | Admitting: Neurology

## 2019-10-13 MED ORDER — PROPRANOLOL HCL 20 MG PO TABS: 20.0000 mg | ORAL_TABLET | Freq: Two times a day (BID) | ORAL | 3 refills | Status: DC

## 2019-10-13 NOTE — Telephone Encounter (Signed)
I called the patient.  CT scan of the brain is unchanged from 2018, no evidence of stroke. The patient is not tolerating Topamax well even at 25 mg dosing, she is having early morning nausea.  We will stop the Topamax, she cannot afford the Qudexy, I will switch her to propranolol 20 mg twice daily.   CT head 10/13/19:  IMPRESSION: Unremarkable CT scan of the head showing only mild age-appropriate changes of small vessel disease and stable small left parietal calcified falx meningioma.  No significant change compared with previous CT scan 02/04/2017.

## 2019-10-23 ENCOUNTER — Other Ambulatory Visit: Payer: Self-pay | Admitting: Rehabilitation

## 2019-10-23 ENCOUNTER — Telehealth: Payer: Self-pay | Admitting: Neurology

## 2019-10-23 DIAGNOSIS — M4004 Postural kyphosis, thoracic region: Secondary | ICD-10-CM

## 2019-10-23 NOTE — Telephone Encounter (Signed)
I reached out to the pt. She reports she has been taking Propranolol 1 tablet at 9 pm for 8 days now and has noticed increased nausea and stomach aching since starting. Due to the increase of these sx pt did not try the two pills daily of this med.   Pt reports she is unable to tolerated this medication and wanted to know if another med could be recommended? Recently topamax 25 mg was tried but d/c due to early morning nausea. She was also rx'd Qudexy for her tremor's but could not start due to cost of med.  Pt was advised Dr. Jannifer Franklin is currently not in the office but I would fwd call to the [facility] to review and advise on.

## 2019-10-23 NOTE — Telephone Encounter (Signed)
Please let patients know, it is okay to stay on propranolol 1 tablets a day, instead of titrating up higher dose, may wait for Dr. Jannifer Franklin for long-term medication management

## 2019-10-23 NOTE — Telephone Encounter (Signed)
Pt notified of Dr. Rhea Belton recommendation-  Pt is agreeable to waiting for Dr. Jannifer Franklin to review and further advise on how to proceed.

## 2019-10-23 NOTE — Telephone Encounter (Signed)
Pt called propranolol (INDERAL) 20 MG tablet making Pt feel nausea. Pt want to know if physical need to prescribe something else. Pt only taking 1 pill in the evening. Would like a call back from the nurse.

## 2019-10-24 MED ORDER — METOPROLOL SUCCINATE ER 25 MG PO TB24: 25.0000 mg | ORAL_TABLET | Freq: Every day | ORAL | 2 refills | Status: DC

## 2019-10-24 NOTE — Telephone Encounter (Signed)
The patient has a lot of drug sensitivities, she has been on and has not tolerated Mysoline, gabapentin, Topamax, clonazepam.  She has been on low-dose alprazolam previously.  She has not been able to tolerate propranolol.  Potentially we could switch her to metoprolol and see if she does better with this.  We could start at a very low-dose with 25 mg extended release tablet daily.  I called the patient, she is amenable to starting the Toprol, she will stop the propranolol and after she is feeling better with her stomach, she will start the Toprol.

## 2019-10-24 NOTE — Addendum Note (Signed)
Addended by: Kathrynn Ducking on: 10/24/2019 12:51 PM   Modules accepted: Orders

## 2019-11-12 ENCOUNTER — Ambulatory Visit
Admission: RE | Admit: 2019-11-12 | Discharge: 2019-11-12 | Disposition: A | Discharge status: Home / Self Care | Payer: Medicare Other | Source: Ambulatory Visit | Attending: Rehabilitation | Admitting: Rehabilitation

## 2019-11-12 DIAGNOSIS — M4004 Postural kyphosis, thoracic region: Secondary | ICD-10-CM

## 2019-12-11 ENCOUNTER — Telehealth: Payer: Self-pay | Admitting: Neurology

## 2019-12-11 MED ORDER — TOPIRAMATE 25 MG PO TABS: 25.0000 mg | ORAL_TABLET | Freq: Every day | ORAL | 1 refills | Status: DC

## 2019-12-11 NOTE — Telephone Encounter (Signed)
I called the patient.  The patient has multiple drug intolerances.  She was not able to tolerate propranolol or metoprolol.  She felt the metoprolol made her dizzy and worsen the tremor.  She wishes to go back on the Topamax which was better tolerated.  We will start her on 25 mg daily.  She indicated that the long-acting Topamax was too expensive for her.

## 2019-12-11 NOTE — Telephone Encounter (Signed)
Dr Willis- please advise 

## 2019-12-11 NOTE — Telephone Encounter (Signed)
Pt called, physician prescribed  metoprolol succinate (TOPROL XL) 25 MG 24 hr tablet. Been taking 3 weeks, not doing well, made tremors worse, and dizziness. Would like to go back to Topiramate. Would like a call from the nurse.

## 2019-12-13 ENCOUNTER — Encounter: Payer: Self-pay | Admitting: Cardiology

## 2019-12-13 ENCOUNTER — Other Ambulatory Visit: Payer: Self-pay

## 2019-12-13 ENCOUNTER — Ambulatory Visit (INDEPENDENT_AMBULATORY_CARE_PROVIDER_SITE_OTHER): Payer: Medicare Other | Admitting: Cardiology

## 2019-12-13 VITALS — BP 130/72 | HR 89 | Temp 97.0°F | Ht 69.0 in | Wt 143.0 lb

## 2019-12-13 DIAGNOSIS — I10 Essential (primary) hypertension: Secondary | ICD-10-CM | POA: Diagnosis not present

## 2019-12-13 DIAGNOSIS — G903 Multi-system degeneration of the autonomic nervous system: Secondary | ICD-10-CM | POA: Diagnosis not present

## 2019-12-13 MED ORDER — HYDRALAZINE HCL 25 MG PO TABS
ORAL_TABLET | ORAL | 6 refills | Status: DC
Start: 2019-12-13 — End: 2022-07-21

## 2019-12-13 NOTE — Progress Notes (Signed)
Primary Care Provider: Hulan Fess, MD Cardiologist: No primary care provider on file. Electrophysiologist: None  Clinic Note: Chief Complaint  Patient presents with  . Follow-up    Annual  . Hypertension    With neurogenic orthostatic hypotension--pressures have been relatively stable.    HPI:    Deanna Hill is a 79 y.o. female with a PMH below who presents today for follow-up for her longstanding NEUROGENIC ORTHOSTATIC HYPOTENSION as well as LABILE HYPERTENSION (at least one episode of PRES), and LE Deep Venous Reflux.  Deanna Hill was last seen on 11/16/2018 -> much more notable tremor.  She noted blood occasional blood pressures as high as 190/80 then 3 days later as low as 100/70.  On occasion she would check her blood pressures and have a recording of 190/80 and then recheck it 30 minutes later showing pressure 130/70.  She therefore did not take medications.  She had a significant dizzy spell that caused her almost lose her balance, but not necessarily near syncope.  Having tried multiple different medications for tremor, but none have worked.  She did not tolerate Neurontin and propranolol combination was started on Topamax.  --> Her last visit, she has been seen by neurology for her tremor that is getting getting worse.  She is also been seen by a spine specialist and has had low back injections for "super claudication.  She felt really dizzy with significant vertigo after that.  She has also had intermittent spells of transient weakness in her left hand considered to be a possible TIA.--> tried Quedexy as well as metoprolol & propranolol -> now back on Topamax --> discussed deep brain stimulator.  Recent Hospitalizations: None  Reviewed  CV studies:    The following studies were reviewed today: (if available, images/films reviewed: From Epic Chart or Care Everywhere) . Carotid Dopplers 10/11/2019 : normal. . Head CT 10/12/2019: Unremarkable with mild age-appropriate  small vessel changes stable small left parietal calcified falx meningioma.  Stable.   Interval History:   Deanna Hill returns here today overall stable from a cardiac standpoint.  She said her blood pressures at home range anywhere from 112/88-148/84.  She has not really had of the significant high or low blood pressures.  She has not really had any syncope or near syncope type symptoms either.  The biggest issue is he been noticing that her tremors have gotten a whole lot worse.  She is very frustrated, because none of the medications are working.  She just had significant side effects from just about all the medications.  She is also having issues with leg weakness, and has been worked up for pseudoclaudication but the MRI did not show any significant nerve impingement. She is very frustrated with the tremor is really making it hard for her to do her day-to-day activities.  She is becoming more dependent on help  CV Review of Symptoms (Summary): no chest pain or dyspnea on exertion positive for - Dizziness and vertigo but no real syncope or near syncope. negative for - edema, irregular heartbeat, orthopnea, palpitations, paroxysmal nocturnal dyspnea, rapid heart rate, shortness of breath or Syncope/near syncope or TIA/amaurosis fugax, claudication  The patient does not have symptoms concerning for COVID-19 infection (fever, chills, cough, or new shortness of breath).   REVIEWED OF SYSTEMS   Review of Systems  Constitutional: Positive for malaise/fatigue (Just not getting to sleep.). Negative for weight loss.  HENT: Negative for congestion and nosebleeds.   Respiratory: Negative for  cough, shortness of breath and wheezing.   Gastrointestinal: Negative for blood in stool and melena.  Genitourinary: Negative for hematuria.  Musculoskeletal: Positive for back pain. Negative for falls.  Neurological: Positive for dizziness, tremors and weakness (Leg weakness). Negative for focal weakness.   Psychiatric/Behavioral: Positive for depression and memory loss. The patient is nervous/anxious and has insomnia.    I have reviewed and (if needed) personally updated the patient's problem list, medications, allergies, past medical and surgical history, social and family history.   PAST MEDICAL HISTORY   Past Medical History:  Diagnosis Date  . Anxiety   . Coronary artery disease, non-occlusive 05/2013   Cath for Abn Myoview => mild CAD, 30% pCx. Otw normal Coronaries.  Normlal EF.  . Essential tremor 06/19/2015  . GERD (gastroesophageal reflux disease)   . Hyperlipidemia   . Hypertension   . Hypothyroidism   . IBS (irritable bowel syndrome)   . Orthostatic hypotension 05/22/2016  . Senile calcific aortic valve sclerosis 04/2016   2D echo showed aortic sclerosis but no stenosis.  Normal wall motion.  GR 2 DD  . Thrombocytosis 03/11/2011    PAST SURGICAL HISTORY   Past Surgical History:  Procedure Laterality Date  . ABDOMINAL HYSTERECTOMY  1979  . CAROTID DOPPLERS  01/2017   normal Carotid, Sub-Clavian & Vertebral Arteries.  [name] CATARACT EXTRACTION Bilateral 2015   Dr. Herbert Deaner  . CHOLECYSTECTOMY, LAPAROSCOPIC  2001  . LEFT HEART CATHETERIZATION WITH CORONARY ANGIOGRAM N/A 06/08/2013   Procedure: LEFT HEART CATHETERIZATION WITH CORONARY ANGIOGRAM;  Surgeon: Jacolyn Reedy, MD;  Location: [facility] CATH LAB;  Service: Cardiovascular: Dr. Wynonia Lawman: Mild CAD, 30%pCx.  Otherwise nonobstructive disease.  Normal LV function.  . TOE SURGERY Left 1994  . TONSILLECTOMY  1952  . TRANSTHORACIC ECHOCARDIOGRAM  02/'18, 11/'18   a) Normal EF of 60-65%.  Normal wall motion.  GR 2 DD.  Aortic sclerosis without stenosis;; b) Mod LVH. EF 60-65%. Ao Sclerosis w/o AS. Pseudonormal - Gr 2 DD.    Immunization History  Administered Date(s) Administered  . Influenza-Unspecified 12/28/2013    MEDICATIONS/ALLERGIES   Current Meds  Medication Sig  . acetaminophen (TYLENOL) 325 MG tablet Take 650 mg by mouth  daily as needed for mild pain.  [name] amLODipine (NORVASC) 5 MG tablet Take 1 tablet (5 mg total) by mouth daily.  [name] aspirin EC 81 MG tablet Take 1 tablet (81 mg total) by mouth daily.  [name] BIOTIN PO Take 1 capsule by mouth daily.  . Cholecalciferol (VITAMIN D) 2000 UNITS CAPS Take 1 capsule by mouth daily.  . hydroxyurea (HYDREA) 500 MG capsule TAKE ONE CAPSULE BY MOUTH EVERY MONDAY, WEDNESDAY, AND friday (Patient taking differently: Pt taking twice a week)  . levothyroxine (SYNTHROID, LEVOTHROID) 88 MCG tablet Take 88 mcg daily by mouth.   . meclizine (ANTIVERT) 25 MG tablet Take 1 tablet (25 mg total) by mouth 3 (three) times daily as needed for dizziness.  . metroNIDAZOLE (METROCREAM) 0.75 % cream Apply 1 application topically daily as needed (for rosacea).  . nitroGLYCERIN (NITROSTAT) 0.4 MG SL tablet Place 1 tablet (0.4 mg total) under the tongue every 5 (five) minutes as needed for chest pain.  [name] topiramate (TOPAMAX) 25 MG tablet Take 1 tablet (25 mg total) by mouth daily.    Allergies  Allergen Reactions  . Iodine Shortness Of Breath and Other (See Comments)    Pt states "Deadly"  . Prednisone     Facial flushing and swelling  . Shellfish Allergy  Anaphylaxis and Other (See Comments)    " I almost died once"     SOCIAL HISTORY/FAMILY HISTORY   Reviewed in Epic:  Pertinent findings: No changes  OBJCTIVE -PE, EKG, labs   Wt Readings from Last 3 Encounters:  12/13/19 143 lb (64.9 kg)  10/05/19 147 lb (66.7 kg)  09/06/19 148 lb 14.4 oz (67.5 kg)    Physical Exam: BP 130/72   Pulse 89   Temp (!) 97 F (36.1 C)   Ht 5\' 9"  (1.753 m)   Wt 143 lb (64.9 kg)   SpO2 96%   BMI 21.12 kg/m  Physical Exam Vitals reviewed.  Constitutional:      General: She is not in acute distress (More so emotional distress than physical).    Appearance: She is normal weight. She is not ill-appearing or toxic-appearing.     Comments: Tired looking  HENT:     Head: Normocephalic and atraumatic.   Neck:     Vascular: No carotid bruit, hepatojugular reflux or JVD.  Cardiovascular:     Rate and Rhythm: Normal rate and regular rhythm.  No extrasystoles are present.    Chest Wall: PMI is not displaced.     Pulses: Normal pulses.     Heart sounds: Murmur (1/6 SEM at RUSB.-->  Neck) heard.  No friction rub. No gallop.   Pulmonary:     Effort: Pulmonary effort is normal. No respiratory distress.     Breath sounds: Normal breath sounds.  Chest:     Chest wall: No tenderness.  Musculoskeletal:        General: No swelling. Normal range of motion.  Neurological:     Mental Status: She is alert and oriented to person, place, and time.     Gait: Gait abnormal (Somewhat slow/deliberate).     Comments: Significant bilateral pill-rolling tremor  Psychiatric:        Thought Content: Thought content normal.        Judgment: Judgment normal.     Comments: Seems very down/very upset.     Adult ECG Report  Rate: 89 ;  Rhythm: normal sinus rhythm and LAFB (-55 ); otherwise normal intervals durations.;   Narrative Interpretation: Relatively normal EKG.  Recent Labs: December 2020: TC 169, TG 106, LDL 111, HDL 39. Cr 0.97.  A1c 5.9 Lab Results  Component Value Date   CHOL 162 04/18/2016   HDL 44 04/18/2016   LDLCALC 106 (H) 04/18/2016   TRIG 58 04/18/2016   CHOLHDL 3.7 04/18/2016   Lab Results  Component Value Date   CREATININE 1.01 (H) 02/07/2017   BUN 21 (H) 02/07/2017   NA 137 02/07/2017   K 3.7 02/07/2017   CL 106 02/07/2017   CO2 25 02/07/2017   Lab Results  Component Value Date   TSH 1.928 02/05/2017    ASSESSMENT/PLAN    Problem List Items Addressed This Visit    Labile essential hypertension (Chronic)    We are allowing for some hypertension because of her vertigo as well as tendency for hypotension.  Target blood pressure ranges 130-160 mmHg.  She has been in that range and has not had to use hydralazine.  Again the recommendation is to recheck pressures 30  minutes after high or low recording.  If pressures remain greater than 170/90, she will take as needed hydralazine which she does have written otherwise she will continue on moderate dose amlodipine.      Relevant Medications   hydrALAZINE (APRESOLINE) 25 MG tablet  Other Relevant Orders   EKG 12-Lead (Completed)   Neurogenic orthostatic hypotension ([place]) - Primary (Chronic)    Unfortunately, the [place] would be the best option for her, but this was not available for her from a financial/insurance standpoint.  As such, we are happy to treat with standing dose of amlodipine and as needed doses of hydralazine for significant hypertension but otherwise long periods of hypertension to the tune of systolic pressures as high as 170/90 mmHg.  Thankfully, she has not had falls because of orthostasis, has had episodes of vertigo where she has lost her balance, but she has not passed out.      Relevant Medications   hydrALAZINE (APRESOLINE) 25 MG tablet   Other Relevant Orders   EKG 12-Lead (Completed)       COVID-19 Education: The signs and symptoms of COVID-19 were discussed with the patient and how to seek care for testing (follow up with PCP or arrange E-visit).   The importance of social distancing and COVID-19 vaccination was discussed today.  The patient is practicing social distancing & Masking.   I spent a total of 25 minutes with the patient spent in direct patient consultation.  Additional time spent with chart review  / charting (studies, outside notes, etc): 6 Total Time: 31 min   Current medicines are reviewed at length with the patient today.  (+/- concerns) N/A  Notice: This dictation was prepared with Dragon dictation along with smaller phrase technology. Any transcriptional errors that result from this process are unintentional and may not be corrected upon review.  Patient Instructions / Medication Changes & Studies & Tests Ordered   Patient Instructions  Medication  Instructions:  No changes except to  Continue if blood pressure is below  110 ( top number)  - do not take Amlodipine that day If blood pressure is above 190  ( top number) - take Hydralazine   25 mg tablet in the evening *If you need a refill on your cardiac medications before your next appointment, please call your pharmacy*   Lab Work: Not needed    Testing/Procedures: Not needed   Follow-Up: At [facility], you and your health needs are our priority.  As part of our continuing mission to provide you with exceptional heart care, we have created designated Provider Care Teams.  These Care Teams include your primary Cardiologist (physician) and Advanced Practice Providers (APPs -  Physician Assistants and Nurse Practitioners) who all work together to provide you with the care you need, when you need it.    Your next appointment:   12 month(s)  The format for your next appointment:   In Person  Provider:   Glenetta Hew, MD   Other Instructions     Studies Ordered:   Orders Placed This Encounter  Procedures  . EKG 12-Lead     Glenetta Hew, M.D., M.S. Interventional Cardiologist   Pager # [phone] Phone # [phone] [address] [place], [place] [address]   Thank you for choosing Heartcare at [facility]!!

## 2019-12-13 NOTE — Patient Instructions (Signed)
Medication Instructions:  No changes except to  Continue if blood pressure is below  110 ( top number)  - do not take Amlodipine that day If blood pressure is above 190  ( top number) - take Hydralazine   25 mg tablet in the evening *If you need a refill on your cardiac medications before your next appointment, please call your pharmacy*   Lab Work: Not needed    Testing/Procedures: Not needed   Follow-Up: At [facility], you and your health needs are our priority.  As part of our continuing mission to provide you with exceptional heart care, we have created designated Provider Care Teams.  These Care Teams include your primary Cardiologist (physician) and Advanced Practice Providers (APPs -  Physician Assistants and Nurse Practitioners) who all work together to provide you with the care you need, when you need it.    Your next appointment:   12 month(s)  The format for your next appointment:   In Person  Provider:   Glenetta Hew, MD   Other Instructions

## 2019-12-22 ENCOUNTER — Encounter: Payer: Self-pay | Admitting: Cardiology

## 2019-12-22 NOTE — Assessment & Plan Note (Addendum)
Unfortunately, the [place] would be the best option for her, but this was not available for her from a financial/insurance standpoint.  As such, we are happy to treat with standing dose of amlodipine and as needed doses of hydralazine for significant hypertension but otherwise long periods of hypertension to the tune of systolic pressures as high as 170/90 mmHg.  Thankfully, she has not had falls because of orthostasis, has had episodes of vertigo where she has lost her balance, but she has not passed out.

## 2019-12-22 NOTE — Assessment & Plan Note (Signed)
We are allowing for some hypertension because of her vertigo as well as tendency for hypotension.  Target blood pressure ranges 130-160 mmHg.  She has been in that range and has not had to use hydralazine.  Again the recommendation is to recheck pressures 30 minutes after high or low recording.  If pressures remain greater than 170/90, she will take as needed hydralazine which she does have written otherwise she will continue on moderate dose amlodipine.

## 2020-02-05 ENCOUNTER — Other Ambulatory Visit: Payer: Self-pay | Admitting: *Deleted

## 2020-02-05 DIAGNOSIS — D473 Essential (hemorrhagic) thrombocythemia: Secondary | ICD-10-CM

## 2020-02-05 MED ORDER — HYDROXYUREA 500 MG PO CAPS: ORAL_CAPSULE | ORAL | 2 refills | Status: DC

## 2020-03-06 NOTE — Progress Notes (Signed)
Patient Care Team: Deanna Fess, MD as PCP - General  DIAGNOSIS:    ICD-10-CM   1. Essential thrombocytosis (HCC)  D47.3     CHIEF COMPLIANT: Follow-up of essential thrombocytosison hydrea  INTERVAL HISTORY: Deanna Hill is a 79 y.o. with above-mentioned history of essential thrombocytosiswhois currently Deanna Hill.She presents to the clinic todayto review her labs.  She tells me overall she does not feel great.  She continues to suffer from tremors which do not seem to get better at all with anything they tried.  She is also complaining of back pain for which she is being managed by her primary care physician.  ALLERGIES:  is allergic to iodine, prednisone, and shellfish allergy.  MEDICATIONS:  Current Outpatient Medications  Medication Sig Dispense Refill   acetaminophen (TYLENOL) 325 MG tablet Take 650 mg by mouth daily as needed for mild pain.     amLODipine (NORVASC) 5 MG tablet Take 1 tablet (5 mg total) by mouth daily.     aspirin EC 81 MG tablet Take 1 tablet (81 mg total) by mouth daily. 90 tablet 3   BIOTIN PO Take 1 capsule by mouth daily.     Cholecalciferol (VITAMIN D) 2000 UNITS CAPS Take 1 capsule by mouth daily.     hydrALAZINE (APRESOLINE) 25 MG tablet Take 1 tablet (25 mg total) daily as needed by mouth. If blood pressure over 454 systolic,take in the evening. 30 tablet 6   hydroxyurea (HYDREA) 500 MG capsule Pt taking twice a week 36 capsule 2   levothyroxine (SYNTHROID, LEVOTHROID) 88 MCG tablet Take 88 mcg daily by mouth.      meclizine (ANTIVERT) 25 MG tablet Take 1 tablet (25 mg total) by mouth 3 (three) times daily as needed for dizziness. 90 tablet 0   metroNIDAZOLE (METROCREAM) 0.75 % cream Apply 1 application topically daily as needed (for rosacea).     nitroGLYCERIN (NITROSTAT) 0.4 MG SL tablet Place 1 tablet (0.4 mg total) under the tongue every 5 (five) minutes as needed for chest pain. 25 tablet 6   topiramate (TOPAMAX) 25 MG  tablet Take 1 tablet (25 mg total) by mouth daily. 90 tablet 1   No current facility-administered medications for this visit.    PHYSICAL EXAMINATION: ECOG PERFORMANCE STATUS: 1 - Symptomatic but completely ambulatory  Vitals:   03/07/20 1126  BP: (!) 163/75  Pulse: 92  Resp: 18  Temp: (!) 97.4 F (36.3 C)  SpO2: 100%   Filed Weights   03/07/20 1126  Weight: 140 lb (63.5 kg)    LABORATORY DATA:  I have reviewed the data as listed CMP Latest Ref Rng & Units 02/07/2017 02/06/2017 02/05/2017  Glucose 65 - 99 mg/dL 101(H) 94 97  BUN 6 - 20 mg/dL 21(H) 14 12  Creatinine 0.44 - 1.00 mg/dL 1.01(H) 0.87 0.87  Sodium 135 - 145 mmol/L 137 139 141  Potassium 3.5 - 5.1 mmol/L 3.7 3.6 3.8  Chloride 101 - 111 mmol/L 106 108 110  CO2 22 - 32 mmol/L 25 24 22   Calcium 8.9 - 10.3 mg/dL 8.6(L) 8.9 8.7(L)  Total Protein 6.5 - 8.1 g/dL - - 6.5  Total Bilirubin 0.3 - 1.2 mg/dL - - 0.8  Alkaline Phos 38 - 126 U/L - - 51  AST 15 - 41 U/L - - 15  ALT 14 - 54 U/L - - 12(L)    Lab Results  Component Value Date   WBC 7.7 03/07/2020   HGB 12.4 03/07/2020   HCT  39.2 03/07/2020   MCV 100.0 03/07/2020   PLT 544 (H) 03/07/2020   NEUTROABS 5.5 03/07/2020    ASSESSMENT & PLAN:  Essential thrombocytosis (HCC) Essential thrombocytosis with JAK2 mutation: Continue Hydrea 500 mg daily and also aspirin 325 mg by mouth once daily. Current dose: Hydroxyurea 500 mg Monday Wednesday Fridaychanged to twice a week from 03/08/2019  Hospitalization 02/02/2018to02/09/2016:Possible PRESPosterior reversible encephalopathy syndrome(due to seizure)VS Complicated Migraine   Patient is a been experiencing multiple comorbidities. Because the patient is experiencing hair loss and she is currently on Hydrea to twice a week.  Hydrea toxicities: 1. Fatigue  2. Hair loss  3. brittle nails  Hospitalization for posterior reversible encephalopathy syndrome  Lab review: 09/05/2018: White count  8.5, hemoglobin 12.4, platelets 469, previously they were 465. 03/08/2019:WBC 7.2, hemoglobin 12.1, platelets 430 09/06/2019: WBC 9.3, MCV 101.3, platelets 541 03/07/2020: WBC 7.7, platelets 544  We decided to continue with the twice a week hydroxyurea. Return again in 6 months for follow-up with labs    No orders of the defined types were placed in this encounter.  The patient has a good understanding of the overall plan. she agrees with it. she will call with any problems that may develop before the next visit here.  Total time spent: 20 mins including face to face time and time spent for planning, charting and coordination of care  Nicholas Lose, MD 03/07/2020  I, Cloyde Reams Dorshimer, am acting as scribe for Dr. Nicholas Lose.  I have reviewed the above documentation for accuracy and completeness, and I agree with the above.

## 2020-03-07 ENCOUNTER — Inpatient Hospital Stay: Payer: Medicare Other

## 2020-03-07 ENCOUNTER — Inpatient Hospital Stay: Payer: Medicare Other | Attending: Hematology and Oncology | Admitting: Hematology and Oncology

## 2020-03-07 ENCOUNTER — Other Ambulatory Visit: Payer: Self-pay

## 2020-03-07 DIAGNOSIS — R251 Tremor, unspecified: Secondary | ICD-10-CM | POA: Diagnosis not present

## 2020-03-07 DIAGNOSIS — D473 Essential (hemorrhagic) thrombocythemia: Secondary | ICD-10-CM | POA: Diagnosis not present

## 2020-03-07 DIAGNOSIS — Z7982 Long term (current) use of aspirin: Secondary | ICD-10-CM | POA: Insufficient documentation

## 2020-03-07 DIAGNOSIS — Z79899 Other long term (current) drug therapy: Secondary | ICD-10-CM | POA: Insufficient documentation

## 2020-03-07 LAB — CBC WITH DIFFERENTIAL (CANCER CENTER ONLY)
Abs Immature Granulocytes: 0.02 10*3/uL (ref 0.00–0.07)
Basophils Absolute: 0.1 10*3/uL (ref 0.0–0.1)
Basophils Relative: 2 %
Eosinophils Absolute: 0.7 10*3/uL — ABNORMAL HIGH (ref 0.0–0.5)
Eosinophils Relative: 9 %
HCT: 39.2 % (ref 36.0–46.0)
Hemoglobin: 12.4 g/dL (ref 12.0–15.0)
Immature Granulocytes: 0 %
Lymphocytes Relative: 11 %
Lymphs Abs: 0.8 10*3/uL (ref 0.7–4.0)
MCH: 31.6 pg (ref 26.0–34.0)
MCHC: 31.6 g/dL (ref 30.0–36.0)
MCV: 100 fL (ref 80.0–100.0)
Monocytes Absolute: 0.5 10*3/uL (ref 0.1–1.0)
Monocytes Relative: 7 %
Neutro Abs: 5.5 10*3/uL (ref 1.7–7.7)
Neutrophils Relative %: 71 %
Platelet Count: 544 10*3/uL — ABNORMAL HIGH (ref 150–400)
RBC: 3.92 MIL/uL (ref 3.87–5.11)
RDW: 13.6 % (ref 11.5–15.5)
WBC Count: 7.7 10*3/uL (ref 4.0–10.5)
nRBC: 0 % (ref 0.0–0.2)

## 2020-03-07 NOTE — Assessment & Plan Note (Signed)
Essential thrombocytosis with JAK2 mutation: Continue Hydrea 500 mg daily and also aspirin 325 mg by mouth once daily. Current dose: Hydroxyurea 500 mg Monday Wednesday Fridaychanged to twice a week from 03/08/2019  Hospitalization 02/02/2018to02/09/2016:Possible PRESPosterior reversible encephalopathy syndrome(due to seizure)VS Complicated Migraine   Patient is a been experiencing multiple comorbidities. Because the patient is experiencing hair loss and she reduced Hydrea to twice a week.  Hydrea toxicities: 1. Fatigue  2. Hair loss  3. brittle nails  Hospitalization for posterior reversible encephalopathy syndrome  Lab review: 09/05/2018: White count 8.5, hemoglobin 12.4, platelets 469, previously they were 465. 03/08/2019:WBC 7.2, hemoglobin 12.1, platelets 430 09/06/2019: WBC 9.3, MCV 101.3, platelets 541  We decided to continue with the twice a week hydroxyurea. Return again in 6 months for follow-up with labs

## 2020-03-20 DIAGNOSIS — E782 Mixed hyperlipidemia: Secondary | ICD-10-CM | POA: Diagnosis not present

## 2020-03-20 DIAGNOSIS — R7301 Impaired fasting glucose: Secondary | ICD-10-CM | POA: Diagnosis not present

## 2020-03-20 DIAGNOSIS — E039 Hypothyroidism, unspecified: Secondary | ICD-10-CM | POA: Diagnosis not present

## 2020-03-20 DIAGNOSIS — Z79899 Other long term (current) drug therapy: Secondary | ICD-10-CM | POA: Diagnosis not present

## 2020-03-22 DIAGNOSIS — N1831 Chronic kidney disease, stage 3a: Secondary | ICD-10-CM | POA: Diagnosis not present

## 2020-03-22 DIAGNOSIS — Z8673 Personal history of transient ischemic attack (TIA), and cerebral infarction without residual deficits: Secondary | ICD-10-CM | POA: Diagnosis not present

## 2020-03-22 DIAGNOSIS — Z0000 Encounter for general adult medical examination without abnormal findings: Secondary | ICD-10-CM | POA: Diagnosis not present

## 2020-03-22 DIAGNOSIS — R0989 Other specified symptoms and signs involving the circulatory and respiratory systems: Secondary | ICD-10-CM | POA: Diagnosis not present

## 2020-03-22 DIAGNOSIS — E782 Mixed hyperlipidemia: Secondary | ICD-10-CM | POA: Diagnosis not present

## 2020-03-22 DIAGNOSIS — R7301 Impaired fasting glucose: Secondary | ICD-10-CM | POA: Diagnosis not present

## 2020-03-22 DIAGNOSIS — E039 Hypothyroidism, unspecified: Secondary | ICD-10-CM | POA: Diagnosis not present

## 2020-04-01 DIAGNOSIS — I10 Essential (primary) hypertension: Secondary | ICD-10-CM | POA: Diagnosis not present

## 2020-04-01 DIAGNOSIS — N1831 Chronic kidney disease, stage 3a: Secondary | ICD-10-CM | POA: Diagnosis not present

## 2020-04-01 DIAGNOSIS — E782 Mixed hyperlipidemia: Secondary | ICD-10-CM | POA: Diagnosis not present

## 2020-04-01 DIAGNOSIS — E039 Hypothyroidism, unspecified: Secondary | ICD-10-CM | POA: Diagnosis not present

## 2020-04-01 DIAGNOSIS — D500 Iron deficiency anemia secondary to blood loss (chronic): Secondary | ICD-10-CM | POA: Diagnosis not present

## 2020-04-01 DIAGNOSIS — K219 Gastro-esophageal reflux disease without esophagitis: Secondary | ICD-10-CM | POA: Diagnosis not present

## 2020-04-09 ENCOUNTER — Ambulatory Visit: Payer: Medicare Other | Admitting: Neurology

## 2020-04-09 ENCOUNTER — Encounter: Payer: Self-pay | Admitting: Neurology

## 2020-04-09 ENCOUNTER — Other Ambulatory Visit: Payer: Self-pay

## 2020-04-09 VITALS — BP 150/80 | HR 86 | Ht 69.0 in | Wt 145.0 lb

## 2020-04-09 DIAGNOSIS — G250 Essential tremor: Secondary | ICD-10-CM

## 2020-04-09 MED ORDER — TOPIRAMATE 25 MG PO TABS: 25.0000 mg | ORAL_TABLET | Freq: Every day | ORAL | 1 refills | Status: DC

## 2020-04-09 NOTE — Progress Notes (Signed)
PATIENT: Deanna Hill DOB: [birth year]  REASON FOR VISIT: follow up HISTORY FROM: patient  HISTORY OF PRESENT ILLNESS: Today 04/09/20 Deanna Hill is an 80 year old female with history of essential tremor for about 25 years.  The tremor worsens over time, affecting the upper extremities, and jaw.  At last visit, had episode of left hand numbness, carotid Doppler was unremarkable, CT head was stable from 2018, no evidence of stroke, no further episodes reported.  Could not tolerate Topamax for tremor, could not afford Qudexy, switch to propanolol 20 mg twice daily, could not tolerate.  Switch to Toprol extended release, but then wanted to go back to topamax.  Remains on Topamax 25 mg daily.  Is tolerating for now, notes when not on any medication, tremor is worse.  Sees a spine specialist for the low back pain, no longer getting injections.  Did not find they were especially helpful.  At times, feels her legs shake, usually when getting off the toilet.  Has been suggested she try some CBD pain cream for the back.  Lives alone, drives a car.  Tremor is bothersome, greatly affects fine motor skills, pretty much every daily activity.  Here today for evaluation unaccompanied.  HISTORY 10/05/2019 Dr. Jannifer Franklin: Deanna Hill is an 81 year old right-handed white female with history of an essential tremor for approximately 25 years.  The patient has had gradual worsening of the tremor over time.  She has tremors affecting the upper extremities in a symmetric fashion and some jaw tremor.  She has been on a multitude of medications without tolerance or effectiveness, she has gained some benefit with low-dose Topamax.  She is on 25 mg at night but has some early morning nausea from this, she is still having difficulty with handwriting and feeding herself at times.  She has had some low back pain and is seen through a spine specialist, she recently got an injection in her low back.  She had some vertigo for several  days following the injection, she has had vertigo previously.  She had a history of migraine headaches when she was younger, she had episodes of vertigo in her 63s and off and on throughout her life.  She has meclizine to take, she claims the vertigo has improved over the last day and a half.  Four days ago, she had a transient episode lasting 15 minutes with numbness in the ulnar aspect of her left hand, the patient was walking at the time, she was not leaning on the elbow or flexing the elbow.  It is not clear whether the hand was weak or not, but the entire thing resolved in about 15 minutes.  She reported no numbness or weakness of the face or the leg.  The patient has a prior history of PRES.  She returns to the office today for an evaluation.   REVIEW OF SYSTEMS: Out of a complete 14 system review of symptoms, the patient complains only of the following symptoms, and all other reviewed systems are negative.  Tremor  ALLERGIES: Allergies  Allergen Reactions  . Iodine Shortness Of Breath and Other (See Comments)    Pt states "Deadly"  . Prednisone     Facial flushing and swelling  . Shellfish Allergy Anaphylaxis and Other (See Comments)    " I almost died once"     HOME MEDICATIONS: Outpatient Medications Prior to Visit  Medication Sig Dispense Refill  . acetaminophen (TYLENOL) 325 MG tablet Take 650 mg by mouth daily as  needed for mild pain.    [name] amLODipine (NORVASC) 5 MG tablet Take 1 tablet (5 mg total) by mouth daily.    [name] aspirin EC 81 MG tablet Take 1 tablet (81 mg total) by mouth daily. 90 tablet 3  . BIOTIN PO Take 1 capsule by mouth daily.    . Cholecalciferol (VITAMIN D) 2000 UNITS CAPS Take 1 capsule by mouth daily.    . hydrALAZINE (APRESOLINE) 25 MG tablet Take 1 tablet (25 mg total) daily as needed by mouth. If blood pressure over [id number] systolic,take in the evening. 30 tablet 6  . hydroxyurea (HYDREA) 500 MG capsule Pt taking twice a week 36 capsule 2  . levothyroxine  (SYNTHROID, LEVOTHROID) 88 MCG tablet Take 88 mcg by mouth daily.    . meclizine (ANTIVERT) 25 MG tablet Take 1 tablet (25 mg total) by mouth 3 (three) times daily as needed for dizziness. 90 tablet 0  . nitroGLYCERIN (NITROSTAT) 0.4 MG SL tablet Place 1 tablet (0.4 mg total) under the tongue every 5 (five) minutes as needed for chest pain. 25 tablet 6  . topiramate (TOPAMAX) 25 MG tablet Take 1 tablet (25 mg total) by mouth daily. 90 tablet 1  . metroNIDAZOLE (METROCREAM) 0.75 % cream Apply 1 application topically daily as needed (for rosacea).     No facility-administered medications prior to visit.    PAST MEDICAL HISTORY: Past Medical History:  Diagnosis Date  . Anxiety   . Coronary artery disease, non-occlusive 05/2013   Cath for Abn Myoview => mild CAD, 30% pCx. Otw normal Coronaries.  Normlal EF.  . Essential tremor 06/19/2015  . GERD (gastroesophageal reflux disease)   . Hyperlipidemia   . Hypertension   . Hypothyroidism   . IBS (irritable bowel syndrome)   . Orthostatic hypotension 05/22/2016  . Senile calcific aortic valve sclerosis 04/2016   2D echo showed aortic sclerosis but no stenosis.  Normal wall motion.  GR 2 DD  . Thrombocytosis 03/11/2011    PAST SURGICAL HISTORY: Past Surgical History:  Procedure Laterality Date  . ABDOMINAL HYSTERECTOMY  1979  . CAROTID DOPPLERS  01/2017   normal Carotid, Sub-Clavian & Vertebral Arteries.  [name] CATARACT EXTRACTION Bilateral 2015   Dr. Herbert Deaner  . CHOLECYSTECTOMY, LAPAROSCOPIC  2001  . LEFT HEART CATHETERIZATION WITH CORONARY ANGIOGRAM N/A 06/08/2013   Procedure: LEFT HEART CATHETERIZATION WITH CORONARY ANGIOGRAM;  Surgeon: Jacolyn Reedy, MD;  Location: [facility] CATH LAB;  Service: Cardiovascular: Dr. Wynonia Lawman: Mild CAD, 30%pCx.  Otherwise nonobstructive disease.  Normal LV function.  . TOE SURGERY Left 1994  . TONSILLECTOMY  1952  . TRANSTHORACIC ECHOCARDIOGRAM  02/'18, 11/'18   a) Normal EF of 60-65%.  Normal wall motion.  GR 2 DD.   Aortic sclerosis without stenosis;; b) Mod LVH. EF 60-65%. Ao Sclerosis w/o AS. Pseudonormal - Gr 2 DD.    FAMILY HISTORY: Family History  Problem Relation Age of Onset  . Skin cancer Mother   . Gallstones Brother   . Gallstones Brother     SOCIAL HISTORY: Social History   Socioeconomic History  . Marital status: Widowed    Spouse name: Not on file  . Number of children: Not on file  . Years of education: Not on file  . Highest education level: Not on file  Occupational History  . Not on file  Tobacco Use  . Smoking status: Never Smoker  . Smokeless tobacco: Never Used  Substance and Sexual Activity  . Alcohol use: No  . Drug  use: No  . Sexual activity: Not Currently  Other Topics Concern  . Not on file  Social History Narrative   Widowed.  Lives home alone.  Retired.  12 th grade education.     \No routine exercise.   Long-term exposure to passive smoke.      In addition to her true allergies, she had a syncopal episode after starting Zoloft.  She is allergic to feathers (down).  Meloxicam causes upset stomach.  Prednisone makes her face red and hot.  Topamax causes diarrhea and gabapentin causes lightheadedness.   Social Determinants of Health   Financial Resource Strain: Not on file  Food Insecurity: Not on file  Transportation Needs: Not on file  Physical Activity: Not on file  Stress: Not on file  Social Connections: Not on file  Intimate Partner Violence: Not on file   PHYSICAL EXAM  Vitals:   04/09/20 1250  BP: (!) 150/80  Pulse: 86  Weight: 145 lb (65.8 kg)  Height: 5\' 9"  (1.753 m)   Body mass index is 21.41 kg/m.  Generalized: Well developed, in no acute distress   Neurological examination  Mentation: Alert oriented to time, place, history taking. Follows all commands speech and language fluent Cranial nerve II-XII: Pupils were equal round reactive to light. Extraocular movements were full, visual field were full on confrontational test. Facial  sensation and strength were normal.  Head turning and shoulder shrug were normal and symmetric.  Voice is soft. Motor: Good strength all extremities Sensory: Sensory testing is intact to soft touch on all 4 extremities. No evidence of extinction is noted.  Coordination: Cerebellar testing reveals good finger-nose-finger and heel-to-shin bilaterally.  Prominent tremor of upper extremities with finger-nose-finger, and at rest, spiral drawl translates tremor. Gait and station: Stooped posture, can walk independently, uses cane in hall Reflexes: Deep tendon reflexes are symmetric  DIAGNOSTIC DATA (LABS, IMAGING, TESTING) - I reviewed patient records, labs, notes, testing and imaging myself where available.  Lab Results  Component Value Date   WBC 7.7 03/07/2020   HGB 12.4 03/07/2020   HCT 39.2 03/07/2020   MCV 100.0 03/07/2020   PLT 544 (H) 03/07/2020      Component Value Date/Time   NA 137 02/07/2017 0640   NA 140 01/18/2017 1029   K 3.7 02/07/2017 0640   K 4.1 01/18/2017 1029   CL 106 02/07/2017 0640   CL 107 06/24/2012 0859   CO2 25 02/07/2017 0640   CO2 25 01/18/2017 1029   GLUCOSE 101 (H) 02/07/2017 0640   GLUCOSE 109 01/18/2017 1029   GLUCOSE 81 06/24/2012 0859   BUN 21 (H) 02/07/2017 0640   BUN 21.7 01/18/2017 1029   CREATININE 1.01 (H) 02/07/2017 0640   CREATININE 1.0 01/18/2017 1029   CALCIUM 8.6 (L) 02/07/2017 0640   CALCIUM 9.3 01/18/2017 1029   PROT 6.5 02/05/2017 0631   PROT 7.1 01/18/2017 1029   ALBUMIN 3.8 02/05/2017 0631   ALBUMIN 4.1 01/18/2017 1029   AST 15 02/05/2017 0631   AST 14 01/18/2017 1029   ALT 12 (L) 02/05/2017 0631   ALT 11 01/18/2017 1029   ALKPHOS 51 02/05/2017 0631   ALKPHOS 54 01/18/2017 1029   BILITOT 0.8 02/05/2017 0631   BILITOT 0.31 01/18/2017 1029   GFRNONAA 52 (L) 02/07/2017 0640   GFRAA >60 02/07/2017 0640   Lab Results  Component Value Date   CHOL 162 04/18/2016   HDL 44 04/18/2016   LDLCALC 106 (H) 04/18/2016   TRIG 58  04/18/2016   CHOLHDL 3.7 04/18/2016   Lab Results  Component Value Date   HGBA1C 5.9 (H) 02/05/2017   Lab Results  Component Value Date   DTOIZTIW58 099 04/16/2016   Lab Results  Component Value Date   TSH 1.928 02/05/2017   ASSESSMENT AND PLAN 80 y.o. year old female  has a past medical history of Anxiety, Coronary artery disease, non-occlusive (05/2013), Essential tremor (06/19/2015), GERD (gastroesophageal reflux disease), Hyperlipidemia, Hypertension, Hypothyroidism, IBS (irritable bowel syndrome), Orthostatic hypotension (05/22/2016), Senile calcific aortic valve sclerosis (04/2016), and Thrombocytosis (03/11/2011). here with:  1.  Essential tremor 2.  Low back pain 3.  Transient left hand numbness-resolved  Has been on a multitude of medications for tremor, unfortunately is quite sensitive to medications. Medication she has tried include: Gabapentin, Xanax, clonazepam, Topamax, propanolol, primidone, Zonegran, and metoprolol.  She will remain on Topamax 25 mg daily, she seems to do well with this medication for now.  We talked about considering referral for deep brain stimulator, this is overwhelming and emotional for her.  She will discuss with her daughter and let me know.  I think a consultation would be beneficial for the long term.  She will follow-up in 6 months or sooner if needed.  I spent 30 minutes of face-to-face and non-face-to-face time with patient.  This included previsit chart review, lab review, study review, order entry, electronic health record documentation, patient education.  Butler Denmark, AGNP-C, DNP 04/09/2020, 1:20 PM [facility] [address] [place], [place] [id number] [phone]

## 2020-04-09 NOTE — Progress Notes (Signed)
I have read the note, and I agree with the clinical assessment and plan.  Adler Chartrand K Tristin Gladman   

## 2020-04-09 NOTE — Patient Instructions (Addendum)
For tremor you have tried:  Gabapentin, Xanax, clonazepam, Topamax, propanolol, primidone, Zonegran.   Continue the Topamax, let me know if you would like referral for Deep Brain Stimulator  See you back in 6 months

## 2020-04-29 DIAGNOSIS — M48062 Spinal stenosis, lumbar region with neurogenic claudication: Secondary | ICD-10-CM | POA: Diagnosis not present

## 2020-04-29 DIAGNOSIS — M4004 Postural kyphosis, thoracic region: Secondary | ICD-10-CM | POA: Diagnosis not present

## 2020-05-03 DIAGNOSIS — I10 Essential (primary) hypertension: Secondary | ICD-10-CM | POA: Diagnosis not present

## 2020-05-03 DIAGNOSIS — N1831 Chronic kidney disease, stage 3a: Secondary | ICD-10-CM | POA: Diagnosis not present

## 2020-05-03 DIAGNOSIS — E782 Mixed hyperlipidemia: Secondary | ICD-10-CM | POA: Diagnosis not present

## 2020-05-03 DIAGNOSIS — K219 Gastro-esophageal reflux disease without esophagitis: Secondary | ICD-10-CM | POA: Diagnosis not present

## 2020-05-03 DIAGNOSIS — E039 Hypothyroidism, unspecified: Secondary | ICD-10-CM | POA: Diagnosis not present

## 2020-05-03 DIAGNOSIS — D500 Iron deficiency anemia secondary to blood loss (chronic): Secondary | ICD-10-CM | POA: Diagnosis not present

## 2020-06-10 DIAGNOSIS — K219 Gastro-esophageal reflux disease without esophagitis: Secondary | ICD-10-CM | POA: Diagnosis not present

## 2020-06-10 DIAGNOSIS — I10 Essential (primary) hypertension: Secondary | ICD-10-CM | POA: Diagnosis not present

## 2020-06-10 DIAGNOSIS — D500 Iron deficiency anemia secondary to blood loss (chronic): Secondary | ICD-10-CM | POA: Diagnosis not present

## 2020-06-10 DIAGNOSIS — E039 Hypothyroidism, unspecified: Secondary | ICD-10-CM | POA: Diagnosis not present

## 2020-06-10 DIAGNOSIS — E782 Mixed hyperlipidemia: Secondary | ICD-10-CM | POA: Diagnosis not present

## 2020-06-10 DIAGNOSIS — M4004 Postural kyphosis, thoracic region: Secondary | ICD-10-CM | POA: Diagnosis not present

## 2020-06-10 DIAGNOSIS — M48062 Spinal stenosis, lumbar region with neurogenic claudication: Secondary | ICD-10-CM | POA: Diagnosis not present

## 2020-06-10 DIAGNOSIS — N1831 Chronic kidney disease, stage 3a: Secondary | ICD-10-CM | POA: Diagnosis not present

## 2020-09-04 NOTE — Progress Notes (Signed)
Patient Care Team: [name], FNP as PCP - General (Family Medicine)  DIAGNOSIS:    ICD-10-CM   1. Essential thrombocytosis (HCC)  D47.3       CHIEF COMPLIANT: Follow-up of essential thrombocytosis on hydrea   INTERVAL HISTORY: Deanna Hill is a 80 y.o. with above-mentioned history of essential thrombocytosis who is currently on hydrea. She presents to the clinic today for follow-up.  She does not report any major side effects to Hydrea.  She has had some brittle nails and the slight hair thinning but she is taking Hydrea only twice a week.  She continues to stay independent by managing her life.  She found redness and swelling of her right earlobe.  She is not know if she poked her ear with her hearing because of her severe tremors.  ALLERGIES:  is allergic to iodine, prednisone, and shellfish allergy.  MEDICATIONS:  Current Outpatient Medications  Medication Sig Dispense Refill   acetaminophen (TYLENOL) 325 MG tablet Take 650 mg by mouth daily as needed for mild pain.     amLODipine (NORVASC) 5 MG tablet Take 1 tablet (5 mg total) by mouth daily.     aspirin EC 81 MG tablet Take 1 tablet (81 mg total) by mouth daily. 90 tablet 3   BIOTIN PO Take 1 capsule by mouth daily.     Cholecalciferol (VITAMIN D) 2000 UNITS CAPS Take 1 capsule by mouth daily.     hydrALAZINE (APRESOLINE) 25 MG tablet Take 1 tablet (25 mg total) daily as needed by mouth. If blood pressure over 993 systolic,take in the evening. 30 tablet 6   hydroxyurea (HYDREA) 500 MG capsule Pt taking twice a week 36 capsule 2   levothyroxine (SYNTHROID, LEVOTHROID) 88 MCG tablet Take 88 mcg by mouth daily.     meclizine (ANTIVERT) 25 MG tablet Take 1 tablet (25 mg total) by mouth 3 (three) times daily as needed for dizziness. 90 tablet 0   nitroGLYCERIN (NITROSTAT) 0.4 MG SL tablet Place 1 tablet (0.4 mg total) under the tongue every 5 (five) minutes as needed for chest pain. 25 tablet 6   topiramate (TOPAMAX) 25  MG tablet Take 1 tablet (25 mg total) by mouth daily. 90 tablet 1   No current facility-administered medications for this visit.    PHYSICAL EXAMINATION: ECOG PERFORMANCE STATUS: 1 - Symptomatic but completely ambulatory  Vitals:   09/05/20 1135  BP: (!) 153/83  Pulse: 90  Resp: 17  Temp: 97.8 F (36.6 C)  SpO2: 100%   Filed Weights   09/05/20 1135  Weight: 151 lb (68.5 kg)    LABORATORY DATA:  I have reviewed the data as listed CMP Latest Ref Rng & Units 02/07/2017 02/06/2017 02/05/2017  Glucose 65 - 99 mg/dL 101(H) 94 97  BUN 6 - 20 mg/dL 21(H) 14 12  Creatinine 0.44 - 1.00 mg/dL 1.01(H) 0.87 0.87  Sodium 135 - 145 mmol/L 137 139 141  Potassium 3.5 - 5.1 mmol/L 3.7 3.6 3.8  Chloride 101 - 111 mmol/L 106 108 110  CO2 22 - 32 mmol/L 25 24 22   Calcium 8.9 - 10.3 mg/dL 8.6(L) 8.9 8.7(L)  Total Protein 6.5 - 8.1 g/dL - - 6.5  Total Bilirubin 0.3 - 1.2 mg/dL - - 0.8  Alkaline Phos 38 - 126 U/L - - 51  AST 15 - 41 U/L - - 15  ALT 14 - 54 U/L - - 12(L)    Lab Results  Component Value Date  WBC 8.4 09/05/2020   HGB 12.1 09/05/2020   HCT 38.3 09/05/2020   MCV 99.2 09/05/2020   PLT 528 (H) 09/05/2020   NEUTROABS 6.1 09/05/2020    ASSESSMENT & PLAN:  Essential thrombocytosis (HCC) Essential thrombocytosis with JAK2 mutation: Continue Hydrea 500 mg daily and also aspirin 325 mg by mouth once daily.  Current dose: Hydroxyurea 500 mg Monday Wednesday Friday changed to twice a week from 03/08/2019   Hospitalization 04/17/2016 to 04/22/2016: Possible PRES Posterior reversible encephalopathy syndrome (due to seizure) VS Complicated Migraine   Patient is a been experiencing multiple comorbidities. Because the patient is experiencing hair loss and she is currently on Hydrea to twice a week.   Hydrea toxicities: 1. Fatigue   2. Hair loss   3. brittle nails    Hospitalization for posterior reversible encephalopathy syndrome She continues to suffer from tremors but is  staying independent.  She is able to walk with the help of a cane.   Lab review:  09/05/2018: White count 8.5, hemoglobin 12.4, platelets 469, previously they were 465. 03/08/2019: WBC 7.2, hemoglobin 12.1, platelets 430 09/06/2019: WBC 9.3, MCV 101.3, platelets 541 03/07/2020: WBC 7.7, platelets 544 09/05/20: WBC 8.4, hemoglobin 12.1, platelets 528   Inflammation of the right lobe of the ear: I sent the prescription for amoxicillin for a week.  We decided to continue with the twice a week hydroxyurea. Return again in 6 months for follow-up with labs    No orders of the defined types were placed in this encounter.  The patient has a good understanding of the overall plan. she agrees with it. she will call with any problems that may develop before the next visit here.  Total time spent: 30 mins including face to face time and time spent for planning, charting and coordination of care  Rulon Eisenmenger, MD, MPH 09/05/2020  I, Thana Ates, am acting as scribe for Dr. Nicholas Lose.  I have reviewed the above documentation for accuracy and completeness, and I agree with the above.

## 2020-09-04 NOTE — Assessment & Plan Note (Signed)
Essential thrombocytosis with JAK2 mutation: Continue Hydrea 500 mg daily and also aspirin 325 mg by mouth once daily. Current dose: Hydroxyurea 500 mg Monday Wednesday Fridaychanged to twice a week from 03/08/2019  Hospitalization 02/02/2018to02/09/2016:Possible PRESPosterior reversible encephalopathy syndrome(due to seizure)VS Complicated Migraine  Patient is a been experiencing multiple comorbidities. Because the patient is experiencing hair loss and she is currently [name] to twice a week.  Hydrea toxicities: 1. Fatigue  2. Hair loss  3. brittle nails  Hospitalization for posterior reversible encephalopathy syndrome  Lab review: 09/05/2018: White count 8.5, hemoglobin 12.4, platelets 469, previously they were 465. 03/08/2019:WBC 7.2, hemoglobin 12.1, platelets 430 09/06/2019:WBC 9.3, MCV 101.3, platelets 541 03/07/2020: WBC 7.7, platelets 544 09/05/20:   We decided to continue with the twice a week hydroxyurea. Return again in 6 months for follow-up with labs

## 2020-09-05 ENCOUNTER — Other Ambulatory Visit: Payer: Self-pay

## 2020-09-05 ENCOUNTER — Inpatient Hospital Stay: Payer: Medicare Other

## 2020-09-05 ENCOUNTER — Inpatient Hospital Stay: Payer: Medicare Other | Attending: Hematology and Oncology | Admitting: Hematology and Oncology

## 2020-09-05 DIAGNOSIS — D473 Essential (hemorrhagic) thrombocythemia: Secondary | ICD-10-CM | POA: Insufficient documentation

## 2020-09-05 DIAGNOSIS — R5383 Other fatigue: Secondary | ICD-10-CM | POA: Insufficient documentation

## 2020-09-05 DIAGNOSIS — L659 Nonscarring hair loss, unspecified: Secondary | ICD-10-CM | POA: Insufficient documentation

## 2020-09-05 DIAGNOSIS — H938X1 Other specified disorders of right ear: Secondary | ICD-10-CM | POA: Insufficient documentation

## 2020-09-05 DIAGNOSIS — L603 Nail dystrophy: Secondary | ICD-10-CM | POA: Insufficient documentation

## 2020-09-05 DIAGNOSIS — Z79899 Other long term (current) drug therapy: Secondary | ICD-10-CM | POA: Insufficient documentation

## 2020-09-05 DIAGNOSIS — R251 Tremor, unspecified: Secondary | ICD-10-CM | POA: Diagnosis not present

## 2020-09-05 LAB — CBC WITH DIFFERENTIAL (CANCER CENTER ONLY)
Abs Immature Granulocytes: 0.03 10*3/uL (ref 0.00–0.07)
Basophils Absolute: 0.1 10*3/uL (ref 0.0–0.1)
Basophils Relative: 2 %
Eosinophils Absolute: 0.9 10*3/uL — ABNORMAL HIGH (ref 0.0–0.5)
Eosinophils Relative: 10 %
HCT: 38.3 % (ref 36.0–46.0)
Hemoglobin: 12.1 g/dL (ref 12.0–15.0)
Immature Granulocytes: 0 %
Lymphocytes Relative: 10 %
Lymphs Abs: 0.8 10*3/uL (ref 0.7–4.0)
MCH: 31.3 pg (ref 26.0–34.0)
MCHC: 31.6 g/dL (ref 30.0–36.0)
MCV: 99.2 fL (ref 80.0–100.0)
Monocytes Absolute: 0.5 10*3/uL (ref 0.1–1.0)
Monocytes Relative: 5 %
Neutro Abs: 6.1 10*3/uL (ref 1.7–7.7)
Neutrophils Relative %: 73 %
Platelet Count: 528 10*3/uL — ABNORMAL HIGH (ref 150–400)
RBC: 3.86 MIL/uL — ABNORMAL LOW (ref 3.87–5.11)
RDW: 13.3 % (ref 11.5–15.5)
WBC Count: 8.4 10*3/uL (ref 4.0–10.5)
nRBC: 0 % (ref 0.0–0.2)

## 2020-09-05 MED ORDER — AMOXICILLIN 500 MG PO CAPS
500.0000 mg | ORAL_CAPSULE | Freq: Two times a day (BID) | ORAL | 0 refills | Status: DC
Start: 2020-09-05 — End: 2020-10-08

## 2020-10-08 ENCOUNTER — Other Ambulatory Visit: Payer: Self-pay

## 2020-10-08 ENCOUNTER — Encounter: Payer: Self-pay | Admitting: Neurology

## 2020-10-08 ENCOUNTER — Ambulatory Visit: Payer: Medicare Other | Admitting: Neurology

## 2020-10-08 VITALS — BP 142/72 | HR 92 | Ht 69.0 in | Wt 139.0 lb

## 2020-10-08 DIAGNOSIS — G250 Essential tremor: Secondary | ICD-10-CM

## 2020-10-08 MED ORDER — TOPIRAMATE 25 MG PO TABS: 25.0000 mg | ORAL_TABLET | Freq: Every day | ORAL | 1 refills | Status: DC

## 2020-10-08 NOTE — Progress Notes (Signed)
PATIENT: SYMBA RAMSDELL DOB: [birth year]  REASON FOR VISIT: follow up HISTORY FROM: patient Primary Neurologist: Dr. Jannifer Franklin   HISTORY OF PRESENT ILLNESS: Today 10/09/20 Steffanie Rainwater here today for follow-up, for essential tremor. Claims when she stand, feels her thighs are weak, when she is up, feels okay, has trouble with getting up and down. Drinks a lot of water, goes to bathroom often. No falls, using cane. Tremor in both hands, jaw, on Topamax 25 mg daily. Still having issues with low back pain, seeing spine specialists, had phone visit, given tramadol, doesn't take it because she doesn't want problems driving. She lives alone. Isn't interested in DBS consultation. A year ago, did PT, admits to being anxious, doesn't get out much, here today alone.   Update 04/09/20 SS: Ms. Brull is an 80 year old female with history of essential tremor for about 25 years.  The tremor worsens over time, affecting the upper extremities, and jaw.  At last visit, had episode of left hand numbness, carotid Doppler was unremarkable, CT head was stable from 2018, no evidence of stroke, no further episodes reported.  Could not tolerate Topamax for tremor, could not afford Qudexy, switch to propanolol 20 mg twice daily, could not tolerate.  Switch to Toprol extended release, but then wanted to go back to topamax.  Remains on Topamax 25 mg daily.  Is tolerating for now, notes when not on any medication, tremor is worse.  Sees a spine specialist for the low back pain, no longer getting injections.  Did not find they were especially helpful.  At times, feels her legs shake, usually when getting off the toilet.  Has been suggested she try some CBD pain cream for the back.  Lives alone, drives a car.  Tremor is bothersome, greatly affects fine motor skills, pretty much every daily activity.  Here today for evaluation unaccompanied.  HISTORY 10/05/2019 Dr. Jannifer Franklin: Ms. Arnet is an 80 year old right-handed white female  with history of an essential tremor for approximately 25 years.  The patient has had gradual worsening of the tremor over time.  She has tremors affecting the upper extremities in a symmetric fashion and some jaw tremor.  She has been on a multitude of medications without tolerance or effectiveness, she has gained some benefit with low-dose Topamax.  She is on 25 mg at night but has some early morning nausea from this, she is still having difficulty with handwriting and feeding herself at times.  She has had some low back pain and is seen through a spine specialist, she recently got an injection in her low back.  She had some vertigo for several days following the injection, she has had vertigo previously.  She had a history of migraine headaches when she was younger, she had episodes of vertigo in her 43s and off and on throughout her life.  She has meclizine to take, she claims the vertigo has improved over the last day and a half.  Four days ago, she had a transient episode lasting 15 minutes with numbness in the ulnar aspect of her left hand, the patient was walking at the time, she was not leaning on the elbow or flexing the elbow.  It is not clear whether the hand was weak or not, but the entire thing resolved in about 15 minutes.  She reported no numbness or weakness of the face or the leg.  The patient has a prior history of PRES.  She returns to the office today for an  evaluation.   REVIEW OF SYSTEMS: Out of a complete 14 system review of symptoms, the patient complains only of the following symptoms, and all other reviewed systems are negative.  Tremor  ALLERGIES: Allergies  Allergen Reactions   Iodine Shortness Of Breath and Other (See Comments)    Pt states "Deadly"   Prednisone     Facial flushing and swelling   Shellfish Allergy Anaphylaxis and Other (See Comments)    " I almost died once"     HOME MEDICATIONS: Outpatient Medications Prior to Visit  Medication Sig Dispense Refill    acetaminophen (TYLENOL) 325 MG tablet Take 650 mg by mouth daily as needed for mild pain.     amLODipine (NORVASC) 5 MG tablet Take 1 tablet (5 mg total) by mouth daily.     aspirin EC 81 MG tablet Take 1 tablet (81 mg total) by mouth daily. 90 tablet 3   BIOTIN PO Take 1 capsule by mouth daily.     Cholecalciferol (VITAMIN D) 2000 UNITS CAPS Take 1 capsule by mouth daily.     hydrALAZINE (APRESOLINE) 25 MG tablet Take 1 tablet (25 mg total) daily as needed by mouth. If blood pressure over [id number] systolic,take in the evening. 30 tablet 6   hydroxyurea (HYDREA) 500 MG capsule Pt taking twice a week 36 capsule 2   levothyroxine (SYNTHROID, LEVOTHROID) 88 MCG tablet Take 88 mcg by mouth daily.     meclizine (ANTIVERT) 25 MG tablet Take 1 tablet (25 mg total) by mouth 3 (three) times daily as needed for dizziness. 90 tablet 0   nitroGLYCERIN (NITROSTAT) 0.4 MG SL tablet Place 1 tablet (0.4 mg total) under the tongue every 5 (five) minutes as needed for chest pain. 25 tablet 6   traMADol (ULTRAM) 50 MG tablet Take 1 tablet by mouth every 12 (twelve) hours as needed.     topiramate (TOPAMAX) 25 MG tablet Take 1 tablet (25 mg total) by mouth daily. 90 tablet 1   amoxicillin (AMOXIL) 500 MG capsule Take 1 capsule (500 mg total) by mouth 2 (two) times daily. 14 capsule 0   No facility-administered medications prior to visit.    PAST MEDICAL HISTORY: Past Medical History:  Diagnosis Date   Anxiety    Coronary artery disease, non-occlusive 05/2013   Cath for Abn Myoview => mild CAD, 30% pCx. Otw normal Coronaries.  Normlal EF.   Essential tremor 06/19/2015   GERD (gastroesophageal reflux disease)    Hyperlipidemia    Hypertension    Hypothyroidism    IBS (irritable bowel syndrome)    Orthostatic hypotension 05/22/2016   Senile calcific aortic valve sclerosis 04/2016   2D echo showed aortic sclerosis but no stenosis.  Normal wall motion.  GR 2 DD   Thrombocytosis 03/11/2011    PAST SURGICAL  HISTORY: Past Surgical History:  Procedure Laterality Date   ABDOMINAL HYSTERECTOMY  1979   CAROTID DOPPLERS  01/2017   normal Carotid, Sub-Clavian & Vertebral Arteries.   CATARACT EXTRACTION Bilateral 2015   Dr. Oneita Hurt, LAPAROSCOPIC  2001   LEFT HEART CATHETERIZATION WITH CORONARY ANGIOGRAM N/A 06/08/2013   Procedure: LEFT HEART CATHETERIZATION WITH CORONARY ANGIOGRAM;  Surgeon: Jacolyn Reedy, MD;  Location: [facility] CATH LAB;  Service: Cardiovascular: Dr. Wynonia Lawman: Mild CAD, 30%pCx.  Otherwise nonobstructive disease.  Normal LV function.   TOE SURGERY Left [place]   TRANSTHORACIC ECHOCARDIOGRAM  02/'18, 11/'18   a) Normal EF of 60-65%.  Normal wall motion.  GR 2 DD.  Aortic sclerosis without stenosis;; b) Mod LVH. EF 60-65%. Ao Sclerosis w/o AS. Pseudonormal - Gr 2 DD.    FAMILY HISTORY: Family History  Problem Relation Age of Onset   Skin cancer Mother    Gallstones Brother    Gallstones Brother     SOCIAL HISTORY: Social History   Socioeconomic History   Marital status: Widowed    Spouse name: Not on file   Number of children: Not on file   Years of education: Not on file   Highest education level: Not on file  Occupational History   Not on file  Tobacco Use   Smoking status: Never   Smokeless tobacco: Never  Substance and Sexual Activity   Alcohol use: No   Drug use: No   Sexual activity: Not Currently  Other Topics Concern   Not on file  Social History Narrative   Widowed.  Lives home alone.  Retired.  12 th grade education.     \No routine exercise.   Long-term exposure to passive smoke.      In addition to her true allergies, she had a syncopal episode after starting Zoloft.  She is allergic to feathers (down).  Meloxicam causes upset stomach.  Prednisone makes her face red and hot.  Topamax causes diarrhea and gabapentin causes lightheadedness.   Social Determinants of Health   Financial Resource Strain: Not on file  Food  Insecurity: Not on file  Transportation Needs: Not on file  Physical Activity: Not on file  Stress: Not on file  Social Connections: Not on file  Intimate Partner Violence: Not on file   PHYSICAL EXAM  Vitals:   10/08/20 1347  BP: (!) 142/72  Pulse: 92  Weight: 139 lb (63 kg)  Height: '5\' 9"'$  (1.753 m)    Body mass index is 20.53 kg/m.  Generalized: Well developed, in no acute distress  Neurological examination  Mentation: Alert oriented to time, place, history taking. Follows all commands speech and language fluent Cranial nerve II-XII: Pupils were equal round reactive to light. Extraocular movements were full, visual field were full on confrontational test. Facial sensation and strength were normal.  Head turning and shoulder shrug were normal and symmetric.  Voice is soft. Motor: Good strength all extremities Sensory: Sensory testing is intact to soft touch on all 4 extremities. No evidence of extinction is noted.  Coordination: Cerebellar testing reveals good finger-nose-finger and heel-to-shin bilaterally.  Prominent tremor of upper extremities with finger-nose-finger, and at rest Gait and station: Stooped posture, can walk independently, uses cane in hall, slow to stand from chair Reflexes: Deep tendon reflexes are symmetric  DIAGNOSTIC DATA (LABS, IMAGING, TESTING) - I reviewed patient records, labs, notes, testing and imaging myself where available.  Lab Results  Component Value Date   WBC 8.4 09/05/2020   HGB 12.1 09/05/2020   HCT 38.3 09/05/2020   MCV 99.2 09/05/2020   PLT 528 (H) 09/05/2020      Component Value Date/Time   NA 137 02/07/2017 0640   NA 140 01/18/2017 1029   K 3.7 02/07/2017 0640   K 4.1 01/18/2017 1029   CL 106 02/07/2017 0640   CL 107 06/24/2012 0859   CO2 25 02/07/2017 0640   CO2 25 01/18/2017 1029   GLUCOSE 101 (H) 02/07/2017 0640   GLUCOSE 109 01/18/2017 1029   GLUCOSE 81 06/24/2012 0859   BUN 21 (H) 02/07/2017 0640   BUN 21.7  01/18/2017 1029   CREATININE 1.01 (H) 02/07/2017  [id number]   CREATININE 1.0 01/18/2017 1029   CALCIUM 8.6 (L) 02/07/2017 0640   CALCIUM 9.3 01/18/2017 1029   PROT 6.5 02/05/2017 0631   PROT 7.1 01/18/2017 1029   ALBUMIN 3.8 02/05/2017 0631   ALBUMIN 4.1 01/18/2017 1029   AST 15 02/05/2017 0631   AST 14 01/18/2017 1029   ALT 12 (L) 02/05/2017 0631   ALT 11 01/18/2017 1029   ALKPHOS 51 02/05/2017 0631   ALKPHOS 54 01/18/2017 1029   BILITOT 0.8 02/05/2017 0631   BILITOT 0.31 01/18/2017 1029   GFRNONAA 52 (L) 02/07/2017 0640   GFRAA >60 02/07/2017 0640   Lab Results  Component Value Date   CHOL 162 04/18/2016   HDL 44 04/18/2016   LDLCALC 106 (H) 04/18/2016   TRIG 58 04/18/2016   CHOLHDL 3.7 04/18/2016   Lab Results  Component Value Date   HGBA1C 5.9 (H) 02/05/2017   Lab Results  Component Value Date   [id number] 04/16/2016   Lab Results  Component Value Date   TSH 1.928 02/05/2017   ASSESSMENT AND PLAN 80 y.o. year old female  has a past medical history of Anxiety, Coronary artery disease, non-occlusive (05/2013), Essential tremor (06/19/2015), GERD (gastroesophageal reflux disease), Hyperlipidemia, Hypertension, Hypothyroidism, IBS (irritable bowel syndrome), Orthostatic hypotension (05/22/2016), Senile calcific aortic valve sclerosis (04/2016), and Thrombocytosis (03/11/2011). here with:  1.  Essential tremor 2.  Low back pain  -Will continue Topamax 25 mg daily, has been sensitive to multiple other medications we have tried to use for tremor, is not interested in consultation for DBS -Will refer to physical therapy, work on leg strengthening exercises, overall gait and balance, she is fearful of falling  -Follow-up in 6 months or sooner if needed  Evangeline Dakin, DNP 10/09/2020, 6:33 AM [facility] [address], [place], [place] [address] [phone]

## 2020-10-08 NOTE — Patient Instructions (Signed)
Referral to physical therapy  Will continue on the Topamax  See you back in 6 months

## 2020-10-09 ENCOUNTER — Encounter: Payer: Self-pay | Admitting: Neurology

## 2020-10-09 ENCOUNTER — Telehealth: Payer: Self-pay

## 2020-10-09 NOTE — Telephone Encounter (Signed)
Referral for physical therapy has been sent to [facility]. P: [phone].

## 2020-10-09 NOTE — Progress Notes (Signed)
I have read the note, and I agree with the clinical assessment and plan.  Aracelie Addis K Alisi Lupien   

## 2020-10-22 ENCOUNTER — Other Ambulatory Visit: Payer: Self-pay

## 2020-10-22 ENCOUNTER — Ambulatory Visit: Payer: Medicare Other | Attending: Neurology

## 2020-10-22 DIAGNOSIS — R2689 Other abnormalities of gait and mobility: Secondary | ICD-10-CM | POA: Diagnosis present

## 2020-10-22 DIAGNOSIS — R2681 Unsteadiness on feet: Secondary | ICD-10-CM | POA: Insufficient documentation

## 2020-10-22 DIAGNOSIS — M6281 Muscle weakness (generalized): Secondary | ICD-10-CM | POA: Insufficient documentation

## 2020-10-22 DIAGNOSIS — R262 Difficulty in walking, not elsewhere classified: Secondary | ICD-10-CM | POA: Diagnosis present

## 2020-10-22 NOTE — Therapy (Signed)
[facility] [address] [place], [place], [address] Phone: [phone]   Fax:  [phone]  Physical Therapy Evaluation  Patient Details  Name: Deanna Hill MRN: [id number] Date of Birth: 03-Jan-1941 Referring Provider (PT): Butler Denmark, NP   Encounter Date: 10/22/2020   PT End of Session - 10/22/20 1230     Visit Number 1    Number of Visits 7    Date for PT Re-Evaluation 12/16/20    Authorization Type UHC Medicare (10th Visit PN)    Progress Note Due on Visit 10    PT Start Time 1230    PT Stop Time 1310    PT Time Calculation (min) 40 min    Equipment Utilized During Treatment Gait belt    Activity Tolerance Patient tolerated treatment well    Behavior During Therapy [facility] for tasks assessed/performed             Past Medical History:  Diagnosis Date   Anxiety    Coronary artery disease, non-occlusive 05/2013   Cath for Abn Myoview => mild CAD, 30% pCx. Otw normal Coronaries.  Normlal EF.   Essential tremor 06/19/2015   GERD (gastroesophageal reflux disease)    Hyperlipidemia    Hypertension    Hypothyroidism    IBS (irritable bowel syndrome)    Orthostatic hypotension 05/22/2016   Senile calcific aortic valve sclerosis 04/2016   2D echo showed aortic sclerosis but no stenosis.  Normal wall motion.  GR 2 DD   Thrombocytosis 03/11/2011    Past Surgical History:  Procedure Laterality Date   ABDOMINAL HYSTERECTOMY  1979   CAROTID DOPPLERS  01/2017   normal Carotid, Sub-Clavian & Vertebral Arteries.   CATARACT EXTRACTION Bilateral 2015   Dr. Oneita Hurt, LAPAROSCOPIC  2001   LEFT HEART CATHETERIZATION WITH CORONARY ANGIOGRAM N/A 06/08/2013   Procedure: LEFT HEART CATHETERIZATION WITH CORONARY ANGIOGRAM;  Surgeon: Jacolyn Reedy, MD;  Location: [facility] CATH LAB;  Service: Cardiovascular: Dr. Wynonia Lawman: Mild CAD, 30%pCx.  Otherwise nonobstructive disease.  Normal LV function.   TOE SURGERY Left  [place]   TRANSTHORACIC ECHOCARDIOGRAM  02/'18, 11/'18   a) Normal EF of 60-65%.  Normal wall motion.  GR 2 DD.  Aortic sclerosis without stenosis;; b) Mod LVH. EF 60-65%. Ao Sclerosis w/o AS. Pseudonormal - Gr 2 DD.    There were no vitals filed for this visit.    Subjective Assessment - 10/22/20 1233     Subjective Patient reports that she has had the essential tremor for years, has gotten worse with time. Patient reports that over the few months that she has noticed legs have been getting weaker. Patient reports she also has some mid back pain. Patient is ambulating with SPC but reports she is not using it in the house. patient denies falls, but has had a few near falls where she felt as she was going backwards.    Pertinent History Anxiety, GERD, CAD, Essential Temor, HTN, HLD, Orthostatic Hypotension.    Limitations Walking;Standing    Patient Stated Goals Strengthen the Legs; Work on Insurance underwriter    Currently in Pain? Yes    Pain Score 2     Pain Location Back    Pain Orientation Mid;Posterior    Pain Descriptors / Indicators Aching    Pain Type Chronic pain    Pain Onset More than a month ago    Pain Frequency Intermittent    Aggravating Factors  standing/walking    Pain Relieving Factors tylenol                [facility] PT Assessment - 10/22/20 0001       Assessment   Medical Diagnosis Imbalance/Essential Tremor    Referring Provider (PT) Butler Denmark, NP    Onset Date/Surgical Date 10/08/20    Hand Dominance Right      Precautions   Precautions Fall;Other (comment)   Orthostatic Hypotension     Restrictions   Weight Bearing Restrictions No      Balance Screen   Has the patient fallen in the past 6 months Yes    How many times? 1    Has the patient had a decrease in activity level because of a fear of falling?  Yes    Is the patient reluctant to leave their home because of a fear of falling?  Yes      [place]  Private residence    Living Arrangements Alone    Available Help at Discharge Neighbor    Type of [place] to enter    Entrance Stairs-Number of Steps 3    Entrance Stairs-Rails Right   uses the [facility] with stairs   Home Layout One level    [place] - single point;Walker - 2 wheels      Prior Function   Level of Independence Independent with household mobility with device;Independent with community mobility with device    Vocation Retired      Associate Professor   Overall Cognitive Status Within Functional Limits for tasks assessed      Sensation   Light Touch Appears Intact      Coordination   Gross Motor Movements are Fluid and Coordinated No    Coordination and Movement Description decreased coordination due to tremor    Finger Nose Finger Test impaired    Heel Shin Test impaired      Posture/Postural Control   Posture/Postural Control Postural limitations    Postural Limitations Rounded Shoulders;Forward head;Increased thoracic kyphosis      ROM / Strength   AROM / PROM / Strength AROM;Strength      AROM   Overall AROM  Within functional limits for tasks performed    Overall AROM Comments for BLEs      Strength   Overall Strength Deficits    Strength Assessment Site Hip;Knee;Ankle    Right/Left Hip Right;Left    Right Hip Flexion 4-/5    Right Hip ABduction 4-/5    Right Hip ADduction 4-/5    Left Hip Flexion 4-/5    Left Hip ABduction 4/5    Left Hip ADduction 4-/5    Right/Left Knee Right;Left    Right/Left Ankle Right;Left      Transfers   Transfers Sit to Stand;Stand to Sit    Sit to Stand 5: Supervision    Five time sit to stand comments  25.03 sec with BUE support    Stand to Sit 5: Supervision    Comments hesitant to let go of B arm rails, cues for upright standing      Ambulation/Gait   Ambulation/Gait Yes    Ambulation/Gait Assistance 4: Min guard;5: Supervision    Ambulation/Gait Assistance Details patient intermittent  use of SPC appropriately, at times carrying AD in hand    Ambulation Distance (Feet) 75 Feet    Assistive device Straight cane    Gait Pattern Step-through pattern;Decreased  arm swing - right;Decreased arm swing - left;Decreased step length - right;Decreased step length - left;Poor foot clearance - left;Poor foot clearance - right;Trunk flexed    Ambulation Surface Level;Indoor    Gait velocity 17.50 seconds = 1.87 ft/sec      Standardized Balance Assessment   Standardized Balance Assessment Timed Up and Go Test;Berg Balance Test      Berg Balance Test   Sit to Stand Able to stand  independently using hands    Standing Unsupported Able to stand safely 2 minutes    Sitting with Back Unsupported but Feet Supported on Floor or Stool Able to sit safely and securely 2 minutes    Stand to Sit Controls descent by using hands    Transfers Able to transfer safely, definite need of hands    Standing Unsupported with Eyes Closed Able to stand 10 seconds safely    Standing Unsupported with Feet Together Able to place feet together independently but unable to hold for 30 seconds    From Standing, Reach Forward with Outstretched Arm Can reach forward >12 cm safely (5")    From Standing Position, Pick up Object from Floor Able to pick up shoe safely and easily    From Standing Position, Turn to Look Behind Over each Shoulder Turn sideways only but maintains balance    Turn 360 Degrees Able to turn 360 degrees safely but slowly    Standing Unsupported, Alternately Place Feet on Step/Stool Able to complete 4 steps without aid or supervision    Standing Unsupported, One Foot in Front Able to take small step independently and hold 30 seconds    Standing on One Leg Tries to lift leg/unable to hold 3 seconds but remains standing independently    Total Score 39    Berg comment: 39/56      Timed Up and Go Test   TUG Normal TUG    Normal TUG (seconds) 18.5    TUG Comments use of SPC; mainly carrying vs using  AD appropriately                        Objective measurements completed on examination: See above findings.               PT Education - 10/22/20 1238     Education Details Educated on [employer]) Educated Patient    Methods Explanation    Comprehension Verbalized understanding              PT Short Term Goals - 10/22/20 1342       PT SHORT TERM GOAL #1   Title Patient will be independent with initial HEP for strength/balance    Baseline no HEP established    Time 3    Period Weeks    Status New    Target Date 11/19/20      PT SHORT TERM GOAL #2   Title Patient will improve 5x sit <> stand to </= 20 seconds for improved balance    Baseline 25.03 seconds    Time 3    Period Weeks    Status New      PT SHORT TERM GOAL #3   Title Patient will imrpove TUG to </= 15 seconds for improved balance and reduced fall risk    Baseline 18.5    Time 3    Period Weeks    Status New  PT Long Term Goals - 10/22/20 1344       PT LONG TERM GOAL #1   Title Patient will be independent with final HEP for strength/balance (ALL LTGs: 12/16/20)    Baseline no HEP established    Time 6    Period Weeks    Status New    Target Date 12/16/20      PT LONG TERM GOAL #2   Title Patient will improve Berg Balance to >/= 45/56 to demo reduced fall rsik    Baseline 39/56    Time 6    Period Weeks    Status New      PT LONG TERM GOAL #3   Title Patient will improve gait speed to >/= 2.5 ft/sec for improved community ambulation    Baseline 1.87 ft/sec    Time 6    Period Weeks    Status New      PT LONG TERM GOAL #4   Title Patient will improve 5x sit <> stand to </= 15 seconds for improved balance and reduced fall rsik    Baseline 25.03 secs    Time 6    Period Weeks    Status New      PT LONG TERM GOAL #5   Title Patient will improve TUG to </= 12 seconds to demo reduced fall risk    Baseline 18.5 secs     Time 6    Period Weeks    Status New      Additional Long Term Goals   Additional Long Term Goals Yes      PT LONG TERM GOAL #6   Title Patient will verbalize understanding of fall prevention within the home to promote reduced fall risk and safety    Baseline dependent    Time 6    Period Weeks    Status New                    Plan - 10/22/20 1258     Clinical Impression Statement Patient is a 80 y.o. female referred to Neuro OPPT for Imbalance. Patient's PMH significant for the following: Anxiety, GERD, CAD, Essential Temor, HTN, HLD, Orthostatic Hypotension. Patient is currently ambulating with SPC at 1.87 ft/sec indicating limited community ambulator. Patient is at increased risk for falls with Merrilee Jansky Balance score of 39/56, and 5x sit <> stand of 25.03 seconds. Patient will benefit from skilled PT services to address the following impairments: decreased strength, abnormal gait, impaired balance, impaired coordination, abnormal posture, and increased risk for falls.    Personal Factors and Comorbidities Comorbidity 3+;Time since onset of injury/illness/exacerbation    Comorbidities Anxiety, GERD, CAD, Essential Temor, HTN, HLD, Orthostatic Hypotension.    Examination-Activity Limitations Reach Overhead;Stand;Locomotion Level;Stairs;Transfers    Examination-Participation Restrictions Driving;Community Activity;Yard Work    Stability/Clinical Decision Making Stable/Uncomplicated    Designer, jewellery Low    Rehab Potential Good    PT Frequency 1x / week    PT Duration 6 weeks   plus eval   PT Treatment/Interventions ADLs/Self Care Home Management;Aquatic Therapy;Canalith Repostioning;Electrical Stimulation;Moist Heat;Cryotherapy;Stair training;Gait training;Functional mobility training;Therapeutic activities;DME Instruction;Neuromuscular re-education;Balance training;Therapeutic exercise;Patient/family education;Orthotic Fit/Training;Manual techniques;Passive range of  motion;Dry needling;Vestibular;Joint Manipulations    PT Next Visit Plan Initiate HEP focused on balance/strengthening; walking program    Consulted and Agree with Plan of Care Patient             Patient will benefit from skilled therapeutic intervention in order to improve the following deficits  and impairments:  Abnormal gait, Decreased balance, Decreased endurance, Decreased activity tolerance, Decreased coordination, Decreased strength, Decreased knowledge of use of DME, Postural dysfunction, Pain, Difficulty walking  Visit Diagnosis: Difficulty in walking, not elsewhere classified  Unsteadiness on feet  Muscle weakness (generalized)  Other abnormalities of gait and mobility     Problem List Patient Active Problem List   Diagnosis Date Noted   Vertigo 03/01/2017   Syncope 02/04/2017   Hypokalemia 02/04/2017   UTI (urinary tract infection) 02/04/2017   Supine hypertension 01/30/2017   Aortic valve sclerosis 01/29/2017   Essential hypertension 01/29/2017   Neurogenic orthostatic hypotension ([place]) 05/22/2016   Difficulty in walking, not elsewhere classified    Labile essential hypertension 04/18/2016   PRES (posterior reversible encephalopathy syndrome)    Iron deficiency anemia 10/17/2015   Essential tremor 06/19/2015   Encounter for long-term (current) use of medications 01/12/2014   Anemia due to chemotherapy 07/14/2013   Abnormal cardiovascular stress test 06/08/2013   Hypothyroidism, acquired    Hyperlipidemia    Essential thrombocytosis ([place]) 03/11/2011    Jones Bales, PT, DPT 10/22/2020, 1:47 PM  [facility] [address] [place], [place], [address] Phone: [phone]   Fax:  [phone]  Name: Deanna Hill MRN: [id number] Date of Birth: Nov 08, 1940

## 2020-11-05 ENCOUNTER — Ambulatory Visit: Payer: Medicare Other

## 2020-11-05 ENCOUNTER — Other Ambulatory Visit: Payer: Self-pay

## 2020-11-05 DIAGNOSIS — M6281 Muscle weakness (generalized): Secondary | ICD-10-CM

## 2020-11-05 DIAGNOSIS — R2689 Other abnormalities of gait and mobility: Secondary | ICD-10-CM

## 2020-11-05 DIAGNOSIS — R262 Difficulty in walking, not elsewhere classified: Secondary | ICD-10-CM

## 2020-11-05 DIAGNOSIS — R2681 Unsteadiness on feet: Secondary | ICD-10-CM

## 2020-11-05 NOTE — Patient Instructions (Signed)
Access Code: [email or web address] URL: [email or web address] Date: 11/05/2020 Prepared by: Baldomero Lamy  Exercises Sit to Stand with Armchair - 1 x daily - 5 x weekly - 1 sets - 10 reps Side Stepping with Counter Support - 1 x daily - 7 x weekly - 3 sets - 10 reps Forward Step Over with Counter Support - 1 x daily - 5 x weekly - 1 sets - 10 reps

## 2020-11-05 NOTE — Therapy (Signed)
[facility] [address] [place], [place], [address] Phone: [phone]   Fax:  [phone]  Physical Therapy Treatment  Patient Details  Name: Deanna Hill MRN: [id number] Date of Birth: August 19, 1940 Referring Provider (PT): Butler Denmark, NP   Encounter Date: 11/05/2020   PT End of Session - 11/05/20 1406     Visit Number 2    Number of Visits 7    Date for PT Re-Evaluation 12/16/20    Authorization Type UHC Medicare (10th Visit PN)    Progress Note Due on Visit 10    PT Start Time 1402    PT Stop Time 1442    PT Time Calculation (min) 40 min    Equipment Utilized During Treatment Gait belt    Activity Tolerance Patient tolerated treatment well    Behavior During Therapy [facility] for tasks assessed/performed             Past Medical History:  Diagnosis Date   Anxiety    Coronary artery disease, non-occlusive 05/2013   Cath for Abn Myoview => mild CAD, 30% pCx. Otw normal Coronaries.  Normlal EF.   Essential tremor 06/19/2015   GERD (gastroesophageal reflux disease)    Hyperlipidemia    Hypertension    Hypothyroidism    IBS (irritable bowel syndrome)    Orthostatic hypotension 05/22/2016   Senile calcific aortic valve sclerosis 04/2016   2D echo showed aortic sclerosis but no stenosis.  Normal wall motion.  GR 2 DD   Thrombocytosis 03/11/2011    Past Surgical History:  Procedure Laterality Date   ABDOMINAL HYSTERECTOMY  1979   CAROTID DOPPLERS  01/2017   normal Carotid, Sub-Clavian & Vertebral Arteries.   CATARACT EXTRACTION Bilateral 2015   Dr. Oneita Hurt, LAPAROSCOPIC  2001   LEFT HEART CATHETERIZATION WITH CORONARY ANGIOGRAM N/A 06/08/2013   Procedure: LEFT HEART CATHETERIZATION WITH CORONARY ANGIOGRAM;  Surgeon: Jacolyn Reedy, MD;  Location: [facility] CATH LAB;  Service: Cardiovascular: Dr. Wynonia Lawman: Mild CAD, 30%pCx.  Otherwise nonobstructive disease.  Normal LV function.   TOE SURGERY Left  [place]   TRANSTHORACIC ECHOCARDIOGRAM  02/'18, 11/'18   a) Normal EF of 60-65%.  Normal wall motion.  GR 2 DD.  Aortic sclerosis without stenosis;; b) Mod LVH. EF 60-65%. Ao Sclerosis w/o AS. Pseudonormal - Gr 2 DD.    There were no vitals filed for this visit.   Subjective Assessment - 11/05/20 1405     Subjective Patient reports that she seems like she is getting worse. Very frustrated. Is having a bad day regarding the tremors. No falls or new changes to report.    Pertinent History Anxiety, GERD, CAD, Essential Temor, HTN, HLD, Orthostatic Hypotension.    Limitations Walking;Standing    Patient Stated Goals Strengthen the Legs; Work on Insurance underwriter    Currently in Pain? Yes    Pain Score 2     Pain Location Back    Pain Orientation Mid;Posterior    Pain Descriptors / Indicators Aching    Pain Type Chronic pain    Pain Onset More than a month ago                [facility] Adult PT Treatment/Exercise - 11/05/20 0001       Transfers   Transfers Sit to Stand;Stand to Sit    Sit to Stand 5: Supervision;4: Min guard    Stand to Sit 5: Supervision;4: Min guard  Comments completed sit <> stand training working toward improved positioning and forward lean, completed x 10 reps with BUE support from mat. Added to HEP. Pt require cues for posture upon standing.      Ambulation/Gait   Ambulation/Gait Yes    Ambulation/Gait Assistance 5: Supervision;4: Min guard    Ambulation/Gait Assistance Details gait throughout therapy session with use of SPC, shuffling steps noted.    Assistive device Straight cane    Gait Pattern Step-through pattern;Decreased arm swing - right;Decreased arm swing - left;Decreased step length - right;Decreased step length - left;Poor foot clearance - left;Poor foot clearance - right;Trunk flexed;Shuffle    Ambulation Surface Level;Indoor      Neuro Re-ed    Neuro Re-ed Details  Initiated HEP via [place]; see below for details             Completed all of the following exercises during session as established initial HEP. Intermittent seated rest break required and cues for posture.   Access Code: [email or web address] URL: [email or web address] Date: 11/05/2020 Prepared by: Baldomero Lamy  Exercises Sit to Stand with Armchair - 1 x daily - 5 x weekly - 1 sets - 10 reps - cues for improved forward lean, UE support used Side Stepping with Counter Support - 1 x daily - 7 x weekly - 3 sets - 10 reps - completed lateral stepping strategies, advised not to side step down. Cues for large steps out/back into midline.  Forward Step Over with Counter Support - 1 x daily - 5 x weekly - 1 sets - 10 reps - educated to visual object in floor/stepping over to work on big steps forward/back        PT Education - 11/05/20 1534     Education Details Initial HEP; Potential Referral from Movement Disorder Specialist    Person(s) Educated Patient    Methods Explanation;Demonstration;Handout    Comprehension Returned demonstration;Verbalized understanding              PT Short Term Goals - 10/22/20 1342       PT [place] #1   Title Patient will be independent with initial HEP for strength/balance    Baseline no HEP established    Time 3    Period Weeks    Status New    Target Date 11/19/20      PT SHORT TERM GOAL #2   Title Patient will improve 5x sit <> stand to </= 20 seconds for improved balance    Baseline 25.03 seconds    Time 3    Period Weeks    Status New      PT SHORT TERM GOAL #3   Title Patient will imrpove TUG to </= 15 seconds for improved balance and reduced fall risk    Baseline 18.5    Time 3    Period Weeks    Status New               PT Long Term Goals - 10/22/20 1344       PT LONG TERM GOAL #1   Title Patient will be independent with final HEP for strength/balance (ALL LTGs: 12/16/20)    Baseline no HEP established    Time 6    Period Weeks    Status New    Target Date  12/16/20      PT LONG TERM GOAL #2   Title Patient will improve Berg Balance to >/= 45/56 to demo reduced fall rsik    Baseline  39/56    Time 6    Period Weeks    Status New      PT LONG TERM GOAL #3   Title Patient will improve gait speed to >/= 2.5 ft/sec for improved community ambulation    Baseline 1.87 ft/sec    Time 6    Period Weeks    Status New      PT LONG TERM GOAL #4   Title Patient will improve 5x sit <> stand to </= 15 seconds for improved balance and reduced fall rsik    Baseline 25.03 secs    Time 6    Period Weeks    Status New      PT LONG TERM GOAL #5   Title Patient will improve TUG to </= 12 seconds to demo reduced fall risk    Baseline 18.5 secs    Time 6    Period Weeks    Status New      Additional Long Term Goals   Additional Long Term Goals Yes      PT LONG TERM GOAL #6   Title Patient will verbalize understanding of fall prevention within the home to promote reduced fall risk and safety    Baseline dependent    Time 6    Period Weeks    Status New                   Plan - 11/05/20 1537     Clinical Impression Statement Pt frustrated at start of session due to increased tremor today. PT educating on potential for be seen by Movement Disorder specialist for further evaluation. Rest of session spent establishing initial HEP focused on sit <> stand and stepping strategy with frequent cues required for upright posture. Will continue to progress toward all LTGs.    Personal Factors and Comorbidities Comorbidity 3+;Time since onset of injury/illness/exacerbation    Comorbidities Anxiety, GERD, CAD, Essential Temor, HTN, HLD, Orthostatic Hypotension.    Examination-Activity Limitations Reach Overhead;Stand;Locomotion Level;Stairs;Transfers    Examination-Participation Restrictions Driving;Community Activity;Yard Work    Stability/Clinical Decision Making Stable/Uncomplicated    Rehab Potential Good    PT Frequency 1x / week    PT  Duration 6 weeks   plus eval   PT Treatment/Interventions ADLs/Self Care Home Management;Aquatic Therapy;Canalith Repostioning;Electrical Stimulation;Moist Heat;Cryotherapy;Stair training;Gait training;Functional mobility training;Therapeutic activities;DME Instruction;Neuromuscular re-education;Balance training;Therapeutic exercise;Patient/family education;Orthotic Fit/Training;Manual techniques;Passive range of motion;Dry needling;Vestibular;Joint Manipulations    PT Next Visit Plan How was HEP? Update from Dr. Arturo Morton office. Continue balance training; work on improved upright posture; stepping strategies. May benefit from [facility] moves.    Consulted and Agree with Plan of Care Patient             Patient will benefit from skilled therapeutic intervention in order to improve the following deficits and impairments:  Abnormal gait, Decreased balance, Decreased endurance, Decreased activity tolerance, Decreased coordination, Decreased strength, Decreased knowledge of use of DME, Postural dysfunction, Pain, Difficulty walking  Visit Diagnosis: Difficulty in walking, not elsewhere classified  Unsteadiness on feet  Muscle weakness (generalized)  Other abnormalities of gait and mobility     Problem List Patient Active Problem List   Diagnosis Date Noted   Vertigo 03/01/2017   Syncope 02/04/2017   Hypokalemia 02/04/2017   UTI (urinary tract infection) 02/04/2017   Supine hypertension 01/30/2017   Aortic valve sclerosis 01/29/2017   Essential hypertension 01/29/2017   Neurogenic orthostatic hypotension ([place]) 05/22/2016   Difficulty in walking, not elsewhere classified  Labile essential hypertension 04/18/2016   PRES (posterior reversible encephalopathy syndrome)    Iron deficiency anemia 10/17/2015   Essential tremor 06/19/2015   Encounter for long-term (current) use of medications 01/12/2014   Anemia due to chemotherapy 07/14/2013   Abnormal cardiovascular stress test 06/08/2013    Hypothyroidism, acquired    Hyperlipidemia    Essential thrombocytosis ([place]) 03/11/2011    Jones Bales, PT, DPT 11/05/2020, 3:45 PM  [facility] [address] [place], [place], [address] Phone: [phone]   Fax:  [phone]  Name: LUDELLA THIBEAULT MRN: [id number] Date of Birth: 1940/04/08

## 2020-11-13 ENCOUNTER — Ambulatory Visit: Payer: Medicare Other | Admitting: Physical Therapy

## 2020-11-20 ENCOUNTER — Encounter: Payer: Self-pay | Admitting: Physical Therapy

## 2020-11-20 ENCOUNTER — Ambulatory Visit: Payer: Medicare Other | Attending: Neurology | Admitting: Physical Therapy

## 2020-11-20 ENCOUNTER — Other Ambulatory Visit: Payer: Self-pay

## 2020-11-20 DIAGNOSIS — R2681 Unsteadiness on feet: Secondary | ICD-10-CM | POA: Insufficient documentation

## 2020-11-20 DIAGNOSIS — M6281 Muscle weakness (generalized): Secondary | ICD-10-CM | POA: Insufficient documentation

## 2020-11-20 DIAGNOSIS — R2689 Other abnormalities of gait and mobility: Secondary | ICD-10-CM | POA: Diagnosis present

## 2020-11-20 DIAGNOSIS — R262 Difficulty in walking, not elsewhere classified: Secondary | ICD-10-CM | POA: Diagnosis present

## 2020-11-20 NOTE — Therapy (Addendum)
[facility] [address] [place], [place], [address] Phone: [phone]   Fax:  [phone]  Physical Therapy Treatment  Patient Details  Name: Deanna Hill MRN: [id number] Date of Birth: 01-05-1941 Referring Provider (PT): Butler Denmark, NP   Encounter Date: 11/20/2020   PT End of Session - 11/20/20 1446     Visit Number 3    Number of Visits 7    Date for PT Re-Evaluation 12/16/20    Authorization Type U[employer] Medicare (10th Visit PN)    Progress Note Due on Visit 10    PT Start Time 1403    PT Stop Time 1444    PT Time Calculation (min) 41 min    Equipment Utilized During Treatment Gait belt    Activity Tolerance Patient tolerated treatment well    Behavior During Therapy [facility] for tasks assessed/performed             Past Medical History:  Diagnosis Date   Anxiety    Coronary artery disease, non-occlusive 05/2013   Cath for Abn Myoview => mild CAD, 30% pCx. Otw normal Coronaries.  Normlal EF.   Essential tremor 06/19/2015   GERD (gastroesophageal reflux disease)    Hyperlipidemia    Hypertension    Hypothyroidism    IBS (irritable bowel syndrome)    Orthostatic hypotension 05/22/2016   Senile calcific aortic valve sclerosis 04/2016   2D echo showed aortic sclerosis but no stenosis.  Normal wall motion.  GR 2 DD   Thrombocytosis 03/11/2011    Past Surgical History:  Procedure Laterality Date   ABDOMINAL HYSTERECTOMY  1979   CAROTID DOPPLERS  01/2017   normal Carotid, Sub-Clavian & Vertebral Arteries.   CATARACT EXTRACTION Bilateral 2015   Dr. Oneita Hurt, LAPAROSCOPIC  2001   LEFT HEART CATHETERIZATION WITH CORONARY ANGIOGRAM N/A 06/08/2013   Procedure: LEFT HEART CATHETERIZATION WITH CORONARY ANGIOGRAM;  Surgeon: Jacolyn Reedy, MD;  Location: [facility] CATH LAB;  Service: Cardiovascular: Dr. Wynonia Lawman: Mild CAD, 30%pCx.  Otherwise nonobstructive disease.  Normal LV function.   TOE SURGERY Left  [place]   TRANSTHORACIC ECHOCARDIOGRAM  02/'18, 11/'18   a) Normal EF of 60-65%.  Normal wall motion.  GR 2 DD.  Aortic sclerosis without stenosis;; b) Mod LVH. EF 60-65%. Ao Sclerosis w/o AS. Pseudonormal - Gr 2 DD.    There were no vitals filed for this visit.   Subjective Assessment - 11/20/20 1405     Subjective Had to miss last week due to having an episode of vertigo, felt better after a couple of days. No falls.  Reports that she has been doing her exercises.    Pertinent History Anxiety, GERD, CAD, Essential Temor, HTN, HLD, Orthostatic Hypotension.    Limitations Walking;Standing    Patient Stated Goals Strengthen the Legs; Work on Insurance underwriter    Currently in Pain? Yes    Pain Score 7     Pain Location Back    Pain Orientation Mid    Pain Descriptors / Indicators Burning    Pain Type Chronic pain    Pain Onset More than a month ago    Aggravating Factors  standing    Pain Relieving Factors sitting                            11/20/20 0001  Transfers  Transfers Sit to Stand;Stand to Sit  Sit to Stand 5:  Supervision;With upper extremity assist  Stand to Sit 5: Supervision;With upper extremity assist  Comments completed sit <> stand training working toward improved positioning and forward lean, completed x 10 reps with BUE support from mat. cues for posture in standing  Ambulation/Gait  Ambulation/Gait Yes  Ambulation/Gait Assistance 5: Supervision;4: Min guard  Ambulation/Gait Assistance Details pt ambulating into clinic with hurry cane, pt reports using cane out in the community due to ease of bringing it with her and a RW in the house at times due to pt reporting a worsening in her balance. worked on Personnel officer with cane with cued for posture, step length, and proper sequencing with cane (pt has tendency to hold cane in RUE and take shuffled steps), pt with difficulty sequencing with cane and tends to perform with step to pattern with  proper cane placement. trialed RW with pt with pt demonstrating improvement in posture and able to consistently take bigger steps. discussed using RW when at home due to improved gait mechanics (pt unable to bring RW out in community due to going up/down stairs and getting in/out of car)  Ambulation Distance (Feet) 115 Feet (x2)  Assistive device Straight cane  Gait Pattern Step-through pattern;Decreased arm swing - right;Decreased arm swing - left;Decreased step length - right;Decreased step length - left;Poor foot clearance - left;Poor foot clearance - right;Trunk flexed;Shuffle  Ambulation Surface Level;Indoor  Therapeutic Activites   Therapeutic Activities Other Therapeutic Activities  Other Therapeutic Activities discussed looking into life alert for safety at home and pt's risk of falling due to pt living home alone          Access Code: [email or web address] URL: [email or web address] Date: 11/20/2020 Prepared by: Janann August  Exercises Sit to Stand with Armchair - 1 x daily - 5 x weekly - 1 sets - 10 reps Side Stepping with Counter Support - 1 x daily - 7 x weekly - 3 sets - 10 reps Forward Step Over with Counter Support - 1 x daily - 5 x weekly - 1 sets - 10 reps   New additions to HEP on 11/21/20:  Seated Active Hip Flexion - 1-2 x daily - 5 x weekly - 2 sets - 10 reps - cues for tall posture and scapular retraction in sitting  Lateral Weight Shift - 1 x daily - 5 x weekly - 2 sets - 10 reps - at [place] - 11/20/20 1424       Balance Exercises: Standing   Other Standing Exercises lateral weight shifting 2 x 10 reps, staggered stance weight shifting x10 reps B - cues for posture and technique              PT Education - 11/20/20 1445     Education Details gait training with cane, additions to HEP    Person(s) Educated Patient    Methods Explanation;Demonstration;Handout    Comprehension Verbalized understanding;Returned  demonstration;Need further instruction              PT Short Term Goals - 10/22/20 1342       PT SHORT TERM GOAL #1   Title Patient will be independent with initial HEP for strength/balance    Baseline no HEP established    Time 3    Period Weeks    Status New    Target Date 11/19/20      PT SHORT TERM GOAL #2   Title Patient will improve 5x sit <> stand to </= 20 seconds for improved  balance    Baseline 25.03 seconds    Time 3    Period Weeks    Status New      PT SHORT TERM GOAL #3   Title Patient will imrpove TUG to </= 15 seconds for improved balance and reduced fall risk    Baseline 18.5    Time 3    Period Weeks    Status New               PT Long Term Goals - 10/22/20 1344       PT LONG TERM GOAL #1   Title Patient will be independent with final HEP for strength/balance (ALL LTGs: 12/16/20)    Baseline no HEP established    Time 6    Period Weeks    Status New    Target Date 12/16/20      PT LONG TERM GOAL #2   Title Patient will improve Berg Balance to >/= 45/56 to demo reduced fall rsik    Baseline 39/56    Time 6    Period Weeks    Status New      PT LONG TERM GOAL #3   Title Patient will improve gait speed to >/= 2.5 ft/sec for improved community ambulation    Baseline 1.87 ft/sec    Time 6    Period Weeks    Status New      PT LONG TERM GOAL #4   Title Patient will improve 5x sit <> stand to </= 15 seconds for improved balance and reduced fall rsik    Baseline 25.03 secs    Time 6    Period Weeks    Status New      PT LONG TERM GOAL #5   Title Patient will improve TUG to </= 12 seconds to demo reduced fall risk    Baseline 18.5 secs    Time 6    Period Weeks    Status New      Additional Long Term Goals   Additional Long Term Goals Yes      PT LONG TERM GOAL #6   Title Patient will verbalize understanding of fall prevention within the home to promote reduced fall risk and safety    Baseline dependent    Time 6    Period  Weeks    Status New                   Plan - 11/21/20 1311     Clinical Impression Statement Reviewed sit <> stand training with pt in improvement in forward lean today. Added standing lateral weight shifting for balance and seated posture exercise, with pt tolerating well. Worked on gait training with hurry cane in RUE, pt continues with difficulty sequencing and tends to put cane too close to BOS with shuffled steps. Used RW with pt able to consistently take longer strides. Will continue to progress towards LTGs.    Personal Factors and Comorbidities Comorbidity 3+;Time since onset of injury/illness/exacerbation    Comorbidities Anxiety, GERD, CAD, Essential Temor, HTN, HLD, Orthostatic Hypotension.    Examination-Activity Limitations Reach Overhead;Stand;Locomotion Level;Stairs;Transfers    Examination-Participation Restrictions Driving;Community Activity;Yard Work    Stability/Clinical Decision Making Stable/Uncomplicated    Rehab Potential Good    PT Frequency 1x / week    PT Duration 6 weeks   plus eval   PT Treatment/Interventions ADLs/Self Care Home Management;Aquatic Therapy;Canalith Repostioning;Electrical Stimulation;Moist Heat;Cryotherapy;Stair training;Gait training;Functional mobility training;Therapeutic activities;DME Instruction;Neuromuscular re-education;Balance training;Therapeutic exercise;Patient/family education;Orthotic Fit/Training;Manual  techniques;Passive range of motion;Dry needling;Vestibular;Joint Manipulations    PT Next Visit Plan check STGs. did pt contact Dr. Carles Collet?  Continue balance training; work on improved upright posture; stepping strategies. gait training with cane. pt did well today with RW and she has one at home, but pt unable to take it out of the house due to stairs. possibly modified seated PWR moves.    Consulted and Agree with Plan of Care Patient             Patient will benefit from skilled therapeutic intervention in order to improve  the following deficits and impairments:  Abnormal gait, Decreased balance, Decreased endurance, Decreased activity tolerance, Decreased coordination, Decreased strength, Decreased knowledge of use of DME, Postural dysfunction, Pain, Difficulty walking  Visit Diagnosis: Unsteadiness on feet  Difficulty in walking, not elsewhere classified  Muscle weakness (generalized)     Problem List Patient Active Problem List   Diagnosis Date Noted   Vertigo 03/01/2017   Syncope 02/04/2017   Hypokalemia 02/04/2017   UTI (urinary tract infection) 02/04/2017   Supine hypertension 01/30/2017   Aortic valve sclerosis 01/29/2017   Essential hypertension 01/29/2017   Neurogenic orthostatic hypotension ([place]) 05/22/2016   Difficulty in walking, not elsewhere classified    Labile essential hypertension 04/18/2016   PRES (posterior reversible encephalopathy syndrome)    Iron deficiency anemia 10/17/2015   Essential tremor 06/19/2015   Encounter for long-term (current) use of medications 01/12/2014   Anemia due to chemotherapy 07/14/2013   Abnormal cardiovascular stress test 06/08/2013   Hypothyroidism, acquired    Hyperlipidemia    Essential thrombocytosis ([place]) 03/11/2011    Arliss Journey, PT, DPT  11/21/2020, 1:14 PM  [facility] [address] [place], [place], [address] Phone: [phone]   Fax:  [phone]  Name: LADONYA MAHOOD MRN: [id number] Date of Birth: 09-30-1940

## 2020-11-20 NOTE — Patient Instructions (Signed)
Access Code: [email or web address] URL: [email or web address] Date: 11/20/2020 Prepared by: Janann August  Exercises Sit to Stand with Armchair - 1 x daily - 5 x weekly - 1 sets - 10 reps Side Stepping with Counter Support - 1 x daily - 7 x weekly - 3 sets - 10 reps Forward Step Over with Counter Support - 1 x daily - 5 x weekly - 1 sets - 10 reps Seated Active Hip Flexion - 1-2 x daily - 5 x weekly - 2 sets - 10 reps Lateral Weight Shift - 1 x daily - 5 x weekly - 2 sets - 10 reps

## 2020-11-26 ENCOUNTER — Other Ambulatory Visit: Payer: Self-pay

## 2020-11-26 ENCOUNTER — Ambulatory Visit: Payer: Medicare Other

## 2020-11-26 DIAGNOSIS — M6281 Muscle weakness (generalized): Secondary | ICD-10-CM

## 2020-11-26 DIAGNOSIS — R2681 Unsteadiness on feet: Secondary | ICD-10-CM

## 2020-11-26 DIAGNOSIS — R2689 Other abnormalities of gait and mobility: Secondary | ICD-10-CM

## 2020-11-26 DIAGNOSIS — R262 Difficulty in walking, not elsewhere classified: Secondary | ICD-10-CM

## 2020-11-26 NOTE — Therapy (Signed)
[facility] [address] [place], [place], [address] Phone: [phone]   Fax:  [phone]  Physical Therapy Treatment  Patient Details  Name: Deanna Hill MRN: [id number] Date of Birth: 05/28/1940 Referring Provider (PT): Butler Denmark, NP   Encounter Date: 11/26/2020   PT End of Session - 11/26/20 1407     Visit Number 4    Number of Visits 7    Date for PT Re-Evaluation 12/16/20    Authorization Type UHC Medicare (10th Visit PN)    Progress Note Due on Visit 10    PT Start Time 1401    PT Stop Time 1445    PT Time Calculation (min) 44 min    Equipment Utilized During Treatment Gait belt    Activity Tolerance Patient tolerated treatment well    Behavior During Therapy [facility] for tasks assessed/performed             Past Medical History:  Diagnosis Date   Anxiety    Coronary artery disease, non-occlusive 05/2013   Cath for Abn Myoview => mild CAD, 30% pCx. Otw normal Coronaries.  Normlal EF.   Essential tremor 06/19/2015   GERD (gastroesophageal reflux disease)    Hyperlipidemia    Hypertension    Hypothyroidism    IBS (irritable bowel syndrome)    Orthostatic hypotension 05/22/2016   Senile calcific aortic valve sclerosis 04/2016   2D echo showed aortic sclerosis but no stenosis.  Normal wall motion.  GR 2 DD   Thrombocytosis 03/11/2011    Past Surgical History:  Procedure Laterality Date   ABDOMINAL HYSTERECTOMY  1979   CAROTID DOPPLERS  01/2017   normal Carotid, Sub-Clavian & Vertebral Arteries.   CATARACT EXTRACTION Bilateral 2015   Dr. Oneita Hurt, LAPAROSCOPIC  2001   LEFT HEART CATHETERIZATION WITH CORONARY ANGIOGRAM N/A 06/08/2013   Procedure: LEFT HEART CATHETERIZATION WITH CORONARY ANGIOGRAM;  Surgeon: Jacolyn Reedy, MD;  Location: [facility] CATH LAB;  Service: Cardiovascular: Dr. Wynonia Lawman: Mild CAD, 30%pCx.  Otherwise nonobstructive disease.  Normal LV function.   TOE SURGERY Left  [place]   TRANSTHORACIC ECHOCARDIOGRAM  02/'18, 11/'18   a) Normal EF of 60-65%.  Normal wall motion.  GR 2 DD.  Aortic sclerosis without stenosis;; b) Mod LVH. EF 60-65%. Ao Sclerosis w/o AS. Pseudonormal - Gr 2 DD.    There were no vitals filed for this visit.   Subjective Assessment - 11/26/20 1404     Subjective Patient reports no new changes. Denies falls, but has had a couple stumbles. Patient reports seeing a spine specialist tomorrow. Patient has not contacted Dr. Carles Collet office, reports does not have the mindset to deal with it.    Pertinent History Anxiety, GERD, CAD, Essential Temor, HTN, HLD, Orthostatic Hypotension.    Limitations Walking;Standing    Patient Stated Goals Strengthen the Legs; Work on Insurance underwriter    Currently in Pain? Yes    Pain Score 5     Pain Location Back    Pain Orientation Mid    Pain Descriptors / Indicators Burning    Pain Type Chronic pain    Pain Onset More than a month ago               [facility] Adult PT Treatment/Exercise - 11/26/20 0001       Transfers   Transfers Sit to Stand;Stand to Sit    Sit to Stand 5: Supervision;With upper extremity assist  Five time sit to stand comments  23.19 seconds with BUE support from chair standard height. continued hesistancy to let go of chair upon standing    Stand to Sit 5: Supervision;With upper extremity assist      Ambulation/Gait   Ambulation/Gait Yes    Ambulation/Gait Assistance 5: Supervision;4: Min guard    Ambulation/Gait Assistance Details with SPC continue to require CGA throughout. Supervision with RW x 115 ft, with PT educating on benefits of RW > SPC at this time. Continued ambulation throughout therapy session with RW. PT providing tennis balls for patient's RW at home for improved propulsion. PT also educating on option of purchasing secondary RW to place in car.    Ambulation Distance (Feet) 115 Feet    Assistive device Straight cane;Rolling walker    Gait Pattern  Step-through pattern;Decreased arm swing - right;Decreased arm swing - left;Decreased step length - right;Decreased step length - left;Poor foot clearance - left;Poor foot clearance - right;Trunk flexed;Shuffle    Ambulation Surface Level;Indoor      Standardized Balance Assessment   Standardized Balance Assessment Timed Up and Go Test      Timed Up and Go Test   TUG Normal TUG    Normal TUG (seconds) 15.15   with SPC; 14.25 secs with RW     Neuro Re-ed    Neuro Re-ed Details  Standing at countertop completed lateral weight shifting with single UE support and reaching activity to target on cabinet x 10 reps bilaterally. Then with staggered stance completed A/P weight shift forward reaching forward to target x 10 reps, then alteranting completing with opposite LE posterior.      Exercises   Exercises Knee/Hip      Knee/Hip Exercises: Aerobic   Nustep Completed NuStep with BLE/BUE on Level 2 x 4 minutes with goals of steps per minute > 30. Patient tolerating well.                 Balance Exercises - 11/26/20 0001       Balance Exercises: Standing   Stepping Strategy Posterior;Lateral;UE support;Limitations    Stepping Strategy Limitations completed lateral stepping strategy x 10 reps bilateral to R/L on firm surface, and then completed x 10 reps posterior stepping strategy bilaterally. Cues for posture and step length noted. BUE support required.    Heel Raises Both;10 reps;Limitations    Heel Raises Limitations with BUE support at countertop                PT Education - 11/26/20 1446     Education Details progress toward STGs    Person(s) Educated Patient    Methods Explanation    Comprehension Verbalized understanding              PT Short Term Goals - 11/26/20 1409       PT SHORT TERM GOAL #1   Title Patient will be independent with initial HEP for strength/balance    Baseline no HEP established; reports independence with current HEP    Time 3     Period Weeks    Status Achieved    Target Date 11/19/20      PT SHORT TERM GOAL #2   Title Patient will improve 5x sit <> stand to </= 20 seconds for improved balance    Baseline 25.03 seconds; 23.19 seconds    Time 3    Period Weeks    Status Not Met      PT SHORT TERM GOAL #3  Title Patient will imrpove TUG to </= 15 seconds for improved balance and reduced fall risk    Baseline 18.5, 15.15 secs with SPC, 14.25 with RW    Time 3    Period Weeks    Status Partially Met               PT Long Term Goals - 10/22/20 1344       PT LONG TERM GOAL #1   Title Patient will be independent with final HEP for strength/balance (ALL LTGs: 12/16/20)    Baseline no HEP established    Time 6    Period Weeks    Status New    Target Date 12/16/20      PT LONG TERM GOAL #2   Title Patient will improve Berg Balance to >/= 45/56 to demo reduced fall rsik    Baseline 39/56    Time 6    Period Weeks    Status New      PT LONG TERM GOAL #3   Title Patient will improve gait speed to >/= 2.5 ft/sec for improved community ambulation    Baseline 1.87 ft/sec    Time 6    Period Weeks    Status New      PT LONG TERM GOAL #4   Title Patient will improve 5x sit <> stand to </= 15 seconds for improved balance and reduced fall rsik    Baseline 25.03 secs    Time 6    Period Weeks    Status New      PT LONG TERM GOAL #5   Title Patient will improve TUG to </= 12 seconds to demo reduced fall risk    Baseline 18.5 secs    Time 6    Period Weeks    Status New      Additional Long Term Goals   Additional Long Term Goals Yes      PT LONG TERM GOAL #6   Title Patient will verbalize understanding of fall prevention within the home to promote reduced fall risk and safety    Baseline dependent    Time 6    Period Weeks    Status New                   Plan - 11/26/20 1548     Clinical Impression Statement Completed assesment of progress toward STGs.  Patient able to  meet/partially meet STG #1 and #3 today demonstrating improved TUG time of 14.25 with RW, and 15.15 seconds with SPC. Patient demonstrating progress with 5x sit <> stand but not to goal level. Patient continue to demo hesistancy of letting go of UE support with sit <> stands. Will continue to progress toward LTGs.    Personal Factors and Comorbidities Comorbidity 3+;Time since onset of injury/illness/exacerbation    Comorbidities Anxiety, GERD, CAD, Essential Temor, HTN, HLD, Orthostatic Hypotension.    Examination-Activity Limitations Reach Overhead;Stand;Locomotion Level;Stairs;Transfers    Examination-Participation Restrictions Driving;Community Activity;Yard Work    Stability/Clinical Decision Making Stable/Uncomplicated    Rehab Potential Good    PT Frequency 1x / week    PT Duration 6 weeks   plus eval   PT Treatment/Interventions ADLs/Self Care Home Management;Aquatic Therapy;Canalith Repostioning;Electrical Stimulation;Moist Heat;Cryotherapy;Stair training;Gait training;Functional mobility training;Therapeutic activities;DME Instruction;Neuromuscular re-education;Balance training;Therapeutic exercise;Patient/family education;Orthotic Fit/Training;Manual techniques;Passive range of motion;Dry needling;Vestibular;Joint Manipulations    PT Next Visit Plan may want to d/c per patient request? Continue balance training; work on improved upright posture; stepping strategies. gait training  with cane. pt did well today with RW and she has one at home, but pt unable to take it out of the house due to stairs. possibly modified seated PWR moves.    Consulted and Agree with Plan of Care Patient             Patient will benefit from skilled therapeutic intervention in order to improve the following deficits and impairments:  Abnormal gait, Decreased balance, Decreased endurance, Decreased activity tolerance, Decreased coordination, Decreased strength, Decreased knowledge of use of DME, Postural  dysfunction, Pain, Difficulty walking  Visit Diagnosis: Unsteadiness on feet  Difficulty in walking, not elsewhere classified  Muscle weakness (generalized)  Other abnormalities of gait and mobility     Problem List Patient Active Problem List   Diagnosis Date Noted   Vertigo 03/01/2017   Syncope 02/04/2017   Hypokalemia 02/04/2017   UTI (urinary tract infection) 02/04/2017   Supine hypertension 01/30/2017   Aortic valve sclerosis 01/29/2017   Essential hypertension 01/29/2017   Neurogenic orthostatic hypotension ([place]) 05/22/2016   Difficulty in walking, not elsewhere classified    Labile essential hypertension 04/18/2016   PRES (posterior reversible encephalopathy syndrome)    Iron deficiency anemia 10/17/2015   Essential tremor 06/19/2015   Encounter for long-term (current) use of medications 01/12/2014   Anemia due to chemotherapy 07/14/2013   Abnormal cardiovascular stress test 06/08/2013   Hypothyroidism, acquired    Hyperlipidemia    Essential thrombocytosis ([place]) 03/11/2011    Jones Bales, PT, DPT 11/26/2020, 3:52 PM  [facility] [address] [place], [place], [address] Phone: [phone]   Fax:  [phone]  Name: ARIAH MOWER MRN: [id number] Date of Birth: 10-Sep-1940

## 2020-12-02 ENCOUNTER — Ambulatory Visit: Payer: Medicare Other

## 2020-12-02 ENCOUNTER — Other Ambulatory Visit: Payer: Self-pay

## 2020-12-02 DIAGNOSIS — R2681 Unsteadiness on feet: Secondary | ICD-10-CM | POA: Diagnosis not present

## 2020-12-02 DIAGNOSIS — M6281 Muscle weakness (generalized): Secondary | ICD-10-CM

## 2020-12-02 DIAGNOSIS — R262 Difficulty in walking, not elsewhere classified: Secondary | ICD-10-CM

## 2020-12-02 DIAGNOSIS — R2689 Other abnormalities of gait and mobility: Secondary | ICD-10-CM

## 2020-12-02 NOTE — Patient Instructions (Signed)
Access Code: [email or web address] URL: [email or web address] Date: 12/02/2020 Prepared by: Baldomero Lamy  Exercises Sit to Stand with Armchair - 1 x daily - 5 x weekly - 1 sets - 10 reps Side Stepping with Counter Support - 1 x daily - 7 x weekly - 3 sets - 10 reps Forward Step Over with Counter Support - 1 x daily - 5 x weekly - 1 sets - 10 reps Seated Active Hip Flexion - 1-2 x daily - 5 x weekly - 2 sets - 10 reps Lateral Weight Shift - 1 x daily - 5 x weekly - 2 sets - 10 reps  Patient Education Falls at [place]

## 2020-12-02 NOTE — Therapy (Signed)
[facility] [address] [place], [place], [address] Phone: [phone]   Fax:  [phone]  Physical Therapy Treatment/Discharge Summary  Patient Details  Name: Deanna Hill MRN: [id number] Date of Birth: 10-27-1940 Referring Provider (PT): Margie Ege, NP  PHYSICAL THERAPY DISCHARGE SUMMARY  Visits from Start of Care: 5  Current functional level related to goals / functional outcomes: See Clinical Impression   Remaining deficits: Abnormal Posture, Fall Risk, Abnormal Gait   Education / Equipment: HEP/Fall Prevention   Patient agrees to discharge. Patient goals were not met. Patient is being discharged due to the patient's request.  Encounter Date: 12/02/2020   PT End of Session - 12/02/20 1404     Visit Number 5    Number of Visits 7    Date for PT Re-Evaluation 12/16/20    Authorization Type [employer] Medicare (10th Visit PN)    Progress Note Due on Visit 10    PT Start Time 1402    PT Stop Time 1439    PT Time Calculation (min) 37 min    Equipment Utilized During Treatment Gait belt    Activity Tolerance Patient tolerated treatment well    Behavior During Therapy WFL for tasks assessed/performed             Past Medical History:  Diagnosis Date   Anxiety    Coronary artery disease, non-occlusive 05/2013   Cath for Abn Myoview => mild CAD, 30% pCx. Otw normal Coronaries.  Normlal EF.   Essential tremor 06/19/2015   GERD (gastroesophageal reflux disease)    Hyperlipidemia    Hypertension    Hypothyroidism    IBS (irritable bowel syndrome)    Orthostatic hypotension 05/22/2016   Senile calcific aortic valve sclerosis 04/2016   2D echo showed aortic sclerosis but no stenosis.  Normal wall motion.  GR 2 DD   Thrombocytosis 03/11/2011    Past Surgical History:  Procedure Laterality Date   ABDOMINAL HYSTERECTOMY  1979   CAROTID DOPPLERS  01/2017   normal Carotid, Sub-Clavian & Vertebral Arteries.    CATARACT EXTRACTION Bilateral 2015   Dr. Hollie Salk, LAPAROSCOPIC  2001   LEFT HEART CATHETERIZATION WITH CORONARY ANGIOGRAM N/A 06/08/2013   Procedure: LEFT HEART CATHETERIZATION WITH CORONARY ANGIOGRAM;  Surgeon: Othella Boyer, MD;  Location: [facility] CATH LAB;  Service: Cardiovascular: Dr. Donnie Aho: Mild CAD, 30%pCx.  Otherwise nonobstructive disease.  Normal LV function.   TOE SURGERY Left 1994   TONSILLECTOMY  1952   TRANSTHORACIC ECHOCARDIOGRAM  02/'18, 11/'18   a) Normal EF of 60-65%.  Normal wall motion.  GR 2 DD.  Aortic sclerosis without stenosis;; b) Mod LVH. EF 60-65%. Ao Sclerosis w/o AS. Pseudonormal - Gr 2 DD.    There were no vitals filed for this visit.   Subjective Assessment - 12/02/20 1405     Subjective No new changes or falls. Specialist wants patient to have MRI. Does not want to try to see dr. tat until has MRI.    Pertinent History Anxiety, GERD, CAD, Essential Temor, HTN, HLD, Orthostatic Hypotension.    Limitations Walking;Standing    Patient Stated Goals Strengthen the Legs; Work on Development worker, international aid    Currently in Pain? Yes    Pain Score 7     Pain Location Back    Pain Orientation Mid    Pain Descriptors / Indicators Burning    Pain Type Chronic pain    Pain Onset More than a month ago  [facility] PT Assessment - 12/02/20 0001       Assessment   Medical Diagnosis Imbalance/Essential Tremor    Referring Provider (PT) Butler Denmark, NP              [facility] Adult PT Treatment/Exercise - 12/02/20 0001       Transfers   Transfers Sit to Stand;Stand to Sit    Sit to Stand 5: Supervision;With upper extremity assist    Stand to Sit 5: Supervision;With upper extremity assist    Comments reviewed sit <> stnads, cues for proper completion.      Ambulation/Gait   Ambulation/Gait Yes    Ambulation/Gait Assistance 5: Supervision;4: Min guard    Ambulation/Gait Assistance Details with SPC throughout therapy gym    Ambulation Distance  (Feet) --   clinic distance   Assistive device Straight cane    Gait Pattern Step-through pattern;Decreased arm swing - right;Decreased arm swing - left;Decreased step length - right;Decreased step length - left;Poor foot clearance - left;Poor foot clearance - right;Trunk flexed;Shuffle    Ambulation Surface Level;Indoor    Gait velocity 12.60 secs with SPC = 2.60 ft/sec      Standardized Balance Assessment   Standardized Balance Assessment Berg Balance Test      Berg Balance Test   Sit to Stand Able to stand  independently using hands    Standing Unsupported Able to stand safely 2 minutes    Sitting with Back Unsupported but Feet Supported on Floor or Stool Able to sit safely and securely 2 minutes    Stand to Sit Sits safely with minimal use of hands    Transfers Able to transfer safely, minor use of hands    Standing Unsupported with Eyes Closed Able to stand 10 seconds safely    Standing Ubsupported with Feet Together Able to place feet together independently and stand for 1 minute with supervision    From Standing, Reach Forward with Outstretched Arm Can reach forward >12 cm safely (5")    From Standing Position, Pick up Object from [place] to pick up shoe safely and easily    From Standing Position, Turn to Look Behind Over each Shoulder Turn sideways only but maintains balance    Turn 360 Degrees Able to turn 360 degrees safely but slowly    Standing Unsupported, Alternately Place Feet on Step/Stool Able to complete >2 steps/needs minimal assist    Standing Unsupported, One Foot in Front Able to take small step independently and hold 30 seconds    Standing on One Leg Tries to lift leg/unable to hold 3 seconds but remains standing independently    Total Score 41              Reviewed Entire HEP and Fall at Home Checklist, provided extensive education on ways to promote safety within the home. Verbal cues still required for proper completion of stepping strategy exercises.    Access Code: [email or web address] URL: [email or web address] Date: 12/02/2020 Prepared by: Baldomero Lamy  Exercises Sit to Stand with Armchair - 1 x daily - 5 x weekly - 1 sets - 10 reps Side Stepping with Counter Support - 1 x daily - 7 x weekly - 3 sets - 10 reps Forward Step Over with Counter Support - 1 x daily - 5 x weekly - 1 sets - 10 reps Seated Active Hip Flexion - 1-2 x daily - 5 x weekly - 2 sets - 10 reps Lateral Weight Shift - 1 x daily - 5 x weekly -  2 sets - 10 reps  Patient Education Falls at [place]      PT Education - 12/02/20 1406     Education Details progress toward LTGs; HEP; Fall Prevention Checklist    Person(s) Educated Patient    Methods Explanation;Handout;Demonstration    Comprehension Verbalized understanding;Returned demonstration              PT Short Term Goals - 11/26/20 1409       PT SHORT TERM GOAL #1   Title Patient will be independent with initial HEP for strength/balance    Baseline no HEP established; reports independence with current HEP    Time 3    Period Weeks    Status Achieved    Target Date 11/19/20      PT SHORT TERM GOAL #2   Title Patient will improve 5x sit <> stand to </= 20 seconds for improved balance    Baseline 25.03 seconds; 23.19 seconds    Time 3    Period Weeks    Status Not Met      PT SHORT TERM GOAL #3   Title Patient will imrpove TUG to </= 15 seconds for improved balance and reduced fall risk    Baseline 18.5, 15.15 secs with SPC, 14.25 with RW    Time 3    Period Weeks    Status Partially Met               PT Long Term Goals - 12/02/20 1406       PT LONG TERM GOAL #1   Title Patient will be independent with final HEP for strength/balance (ALL LTGs: 12/16/20)    Baseline no HEP established; repotrs independence with final HEP on 9/19    Time 6    Period Weeks    Status Achieved      PT LONG TERM GOAL #2   Title Patient will improve Berg Balance to >/= 45/56 to demo  reduced fall rsik    Baseline 39/56; 41/56    Time 6    Period Weeks    Status Not Met      PT LONG TERM GOAL #3   Title Patient will improve gait speed to >/= 2.5 ft/sec for improved community ambulation    Baseline 1.87 ft/sec, 2.60 ft/sec with SPC    Time 6    Period Weeks    Status Achieved      PT LONG TERM GOAL #4   Title Patient will improve 5x sit <> stand to </= 15 seconds for improved balance and reduced fall rsik    Baseline 25.03 secs; 23.19    Time 6    Period Weeks    Status Not Met      PT LONG TERM GOAL #5   Title Patient will improve TUG to </= 12 seconds to demo reduced fall risk    Baseline 18.5 secs; 14.25 secs    Time 6    Period Weeks    Status Not Met      PT LONG TERM GOAL #6   Title Patient will verbalize understanding of fall prevention within the home to promote reduced fall risk and safety    Baseline dependent; verbalize understanding    Time 6    Period Weeks    Status Achieved                   Plan - 12/02/20 1533     Clinical Impression Statement Per patient request  would like to d/c from PT services today. Therefore finished assesment of patient's progress toward LTG. Patient able to meet LTG #1,3, and 6 today. Patient improved gait speed to 2.60 ft/sec with SPC. Patient made progress toward 5x sit <>stand, TUG, and Berg Balance but not to goal level. PT reviewed HEP and educated on fall prevention to promote safety within the home. Per patietn request, patient will be discharged from PT services at this time.    Personal Factors and Comorbidities Comorbidity 3+;Time since onset of injury/illness/exacerbation    Comorbidities Anxiety, GERD, CAD, Essential Temor, HTN, HLD, Orthostatic Hypotension.    Examination-Activity Limitations Reach Overhead;Stand;Locomotion Level;Stairs;Transfers    Examination-Participation Restrictions Driving;Community Activity;Yard Work    Stability/Clinical Decision Making Stable/Uncomplicated    Rehab  Potential Good    PT Frequency 1x / week    PT Duration 6 weeks   plus eval   PT Treatment/Interventions ADLs/Self Care Home Management;Aquatic Therapy;Canalith Repostioning;Electrical Stimulation;Moist Heat;Cryotherapy;Stair training;Gait training;Functional mobility training;Therapeutic activities;DME Instruction;Neuromuscular re-education;Balance training;Therapeutic exercise;Patient/family education;Orthotic Fit/Training;Manual techniques;Passive range of motion;Dry needling;Vestibular;Joint Manipulations    Consulted and Agree with Plan of Care Patient             Patient will benefit from skilled therapeutic intervention in order to improve the following deficits and impairments:  Abnormal gait, Decreased balance, Decreased endurance, Decreased activity tolerance, Decreased coordination, Decreased strength, Decreased knowledge of use of DME, Postural dysfunction, Pain, Difficulty walking  Visit Diagnosis: Unsteadiness on feet  Muscle weakness (generalized)  Difficulty in walking, not elsewhere classified  Other abnormalities of gait and mobility     Problem List Patient Active Problem List   Diagnosis Date Noted   Vertigo 03/01/2017   Syncope 02/04/2017   Hypokalemia 02/04/2017   UTI (urinary tract infection) 02/04/2017   Supine hypertension 01/30/2017   Aortic valve sclerosis 01/29/2017   Essential hypertension 01/29/2017   Neurogenic orthostatic hypotension ([place]) 05/22/2016   Difficulty in walking, not elsewhere classified    Labile essential hypertension 04/18/2016   PRES (posterior reversible encephalopathy syndrome)    Iron deficiency anemia 10/17/2015   Essential tremor 06/19/2015   Encounter for long-term (current) use of medications 01/12/2014   Anemia due to chemotherapy 07/14/2013   Abnormal cardiovascular stress test 06/08/2013   Hypothyroidism, acquired    Hyperlipidemia    Essential thrombocytosis ([place]) 03/11/2011    Jones Bales, PT,  DPT 12/02/2020, 3:35 PM  [facility] [address] [place], [place], [address] Phone: [phone]   Fax:  [phone]  Name: REVERIE VAQUERA MRN: [id number] Date of Birth: Apr 18, 1940

## 2020-12-03 ENCOUNTER — Other Ambulatory Visit: Payer: Self-pay | Admitting: Rehabilitation

## 2020-12-03 DIAGNOSIS — M4004 Postural kyphosis, thoracic region: Secondary | ICD-10-CM

## 2020-12-09 ENCOUNTER — Ambulatory Visit: Payer: Medicare Other

## 2020-12-15 ENCOUNTER — Ambulatory Visit
Admission: RE | Admit: 2020-12-15 | Discharge: 2020-12-15 | Disposition: A | Discharge status: Home / Self Care | Payer: Medicare Other | Source: Ambulatory Visit | Attending: Rehabilitation | Admitting: Rehabilitation

## 2020-12-15 ENCOUNTER — Other Ambulatory Visit: Payer: Self-pay

## 2020-12-15 DIAGNOSIS — M4004 Postural kyphosis, thoracic region: Secondary | ICD-10-CM

## 2020-12-16 ENCOUNTER — Ambulatory Visit: Payer: Medicare Other | Admitting: Physical Therapy

## 2020-12-27 ENCOUNTER — Encounter: Payer: Self-pay | Admitting: Neurology

## 2021-01-13 NOTE — Progress Notes (Signed)
Assessment/Plan:   idiopathic Parkinson's disease.  The patient has tremor, bradykinesia, rigidity and mild postural instability.  -We discussed the diagnosis as well as pathophysiology of the disease.  We discussed treatment options as well as prognostic indicators.  Patient education was provided.  -We discussed that it used to be thought that levodopa would increase risk of melanoma but now it is believed that Parkinsons itself likely increases risk of melanoma. she is to get regular skin checks.  -Greater than 50% of the 60 minute visit was spent in counseling answering questions and talking about what to expect now as well as in the future.  We talked about medication options as well as potential future surgical options.  We talked about safety in the home.  -We decided to add carbidopa/levodopa 25/100.  1/2 tab tid x 1 wk, then 1/2 in am & noon & 1 at night for a week, then 1/2 in am &1 at noon &night for a week, then 1 po tid.  Risks, benefits, side effects and alternative therapies were discussed.  The opportunity to ask questions was given and they were answered to the best of my ability.  The patient expressed understanding and willingness to follow the outlined treatment protocols.  -Patient has been very sensitive to medications.  With many medications, she has not taken them long before they were discontinued.  She and I discussed this today, in relationship to levodopa.  Discussed with her that we can start the titration slowly, but ultimately this is a medication that she needs, and she will feel much better if she takes.  We discussed how to take it in relationship to food/protein.  -We will send home PT. patient very nervous about driving.  -We discussed community resources in the area including patient support groups and community exercise programs for PD and pt education was provided to the patient.  -Topiramate will be discontinued.  She is only on 25 mg.  2. GAD/Depression  -Think  this is a big issue in patient's sense of overall wellbeing.  Counseling resources were offered to the patient today.  I do think that medication could help, but given her inability to tolerate multiple medications, I think that we should start levodopa first.  I think that primary care needs to be involved with this.  3.  History of chronic vertigo/dizziness/nausea  4.  Hx of PRES  `-Seen by Dr. Leonie Man in 2018.   Subjective:   Deanna Hill was seen in consultation in the movement disorder clinic at the request of [name], FNP.  The evaluation is for tremor.  Patient previously under the care of [facility] neurology.  She is here today for second opinion.  She was last seen by Dr. Jannifer Franklin' nurse practitioner July 26.  Pt with daughter who supplements the history.  Notes from [facility] neurology start in April, 2017 (although that note indicates that she was seen in 2012, but they were not on epic at that time) and indicate that patient had had tremor for at least 20 years at that point in time.  She was started on Xanax for tremor in 2017.  She was tried on clonazepam in 2019, but it caused hangover effect.  She tried primidone in 2019.  She took 1 dose with the Topamax 1 night and tried the primidone the next day and felt dizzy and nauseated.  Patient took it for a little while longer without the Topamax, and felt better, but still felt weak, so  the Topamax was stopped.  She started on propranolol, 10 mg twice per day at the end of 2019.  She reported that it caused dizziness.  She tried gabapentin, 100 mg twice per day in 2020.  Tremor involves the bilateral UE and now it really involves the whole body.  It started in the R hand years ago.  She has some leg tremor now and has more trouble getting up/down from chair/toilet now.  Tremor is most noticeable when she is nervous/hungry/BS drops/trying to eat.  Tremor has gotten much worse over the years.    There is a ? family hx of tremor in her  mother in the later years.    Affected by caffeine:  doesn't drink caffeine but she does eat chocolate Affected by alcohol: doesn't drink alcohol Affected by stress:  Yes.   Affected by fatigue:  No. Spills soup if on spoon:  Yes.   Spills glass of liquid if full:  Yes.   Affects ADL's (tying shoes, brushing teeth, etc):  No.  Current/Previously tried tremor medications: Topamax 25 mg (was on it for headache post PRES but thought it helped tremor); clonazepam (hangover effect); propranolol; Zonegran is on her list of not tolerated medications, but records indicate that she was given it, but she never tried it because she was scared of potential side effects; primidone, tried at very low dose for just a few weeks and reported possible weakness  Current medications that may exacerbate tremor: None  Outside reports reviewed: Of note, the patient was in the hospital in February, 2018.  She saw Dr. Leonie Man.  She had an event that he thought was consistent with PRES   Voice: gotten softer Wet Pillows: Yes.   Postural symptoms:  Yes.    Falls?  No., none in recent years (had fall about 5-6 years ago and broke "tailbone") Bradykinesia symptoms: shuffling gait, slow movements, and difficulty getting out of a chair Loss of smell:  No. Loss of taste:  No. Urinary Incontinence:  Yes.  , wears pantyliner as can't always get there fast enough Difficulty Swallowing:  Yes.  , some with solids occasionally Handwriting, micrographia: Yes.   Depression:  Yes.   And anxious Memory changes:  No., lives by self, limited driving, does finances in home Hallucinations:  No. But did happen in the hospital  visual distortions: Yes.   N/V:  Yes.  , chronic (with vertigo and she relates to topamax as well) Lightheaded:  Yes.  has chronic, intermittent vertigo Diplopia: not usually    Allergies  Allergen Reactions   Iodine Shortness Of Breath and Other (See Comments)    Pt states "Deadly"   Prednisone      Facial flushing and swelling   Shellfish Allergy Anaphylaxis and Other (See Comments)    " I almost died once"     Current Outpatient Medications  Medication Instructions   acetaminophen (TYLENOL) 650 mg, Daily PRN   amLODipine (NORVASC) 5 mg, Oral, Daily   aspirin EC 81 mg, Oral, Daily   BIOTIN PO 1 capsule, Oral, Daily   Cholecalciferol (VITAMIN D) 2000 UNITS CAPS 1 capsule, Daily   hydrALAZINE (APRESOLINE) 25 MG tablet Take 1 tablet (25 mg total) daily as needed by mouth. If blood pressure over 786 systolic,take in the evening.   hydroxyurea (HYDREA) 500 MG capsule Pt taking twice a week   levothyroxine (SYNTHROID) 88 mcg, Oral, Daily   meclizine (ANTIVERT) 25 mg, Oral, 3 times daily PRN   nitroGLYCERIN (NITROSTAT)  0.4 mg, Sublingual, Every 5 min PRN   traMADol (ULTRAM) 50 MG tablet 1 tablet, Oral, Every 12 hours PRN     Objective:   VITALS:   Vitals:   01/14/21 1322  BP: 136/68  Pulse: 89  SpO2: 94%  Weight: 134 lb 9.6 oz (61.1 kg)  Height: 5\' 9"  (1.753 m)   Gen:  Appears stated age and in NAD. HEENT:  Normocephalic, atraumatic. The mucous membranes are moist. The superficial temporal arteries are without ropiness or tenderness. Cardiovascular: Regular rate and rhythm. Lungs: Clear to auscultation bilaterally. Neck: There are no carotid bruits noted bilaterally.  NEUROLOGICAL: GEN:  The patient appears stated age and is in NAD. HEENT:  Normocephalic, atraumatic.  The mucous membranes are moist. The superficial temporal arteries are without ropiness or tenderness. CV:  RRR Lungs:  CTAB Neck/HEME:  There are no carotid bruits bilaterally. Neck is flexed  Neurological examination:  Orientation: The patient is alert and oriented x3. Cranial nerves: There is good facial symmetry. The speech is fluent and clear. Soft palate rises symmetrically and there is no tongue deviation. Hearing is intact to conversational tone. Sensation: Sensation is intact to light touch  throughout Motor: Strength is 5/5 in the bilateral upper and lower extremities.   Shoulder shrug is equal and symmetric.  There is no pronator drift.  Movement examination: Tone: There is mod increased tone in the RUE and RLE.  There is mild to mod increased tone in the LUE Abnormal movements: there is R>LUE rest tremor.  There is some mild postural tremor, but better with intention Coordination:  There is decremation with RAM's, with any form of RAMS, including alternating supination and pronation of the forearm, hand opening and closing, finger taps, heel taps and toe taps, LE>UE Gait and Station: The patient has significant difficulty arising out of a deep-seated chair without the use of the hands.  Patient makes 5 attempts pushing off of the chair before she is successfully able to arise.  Stride length is decreased.  She is markedly flexed forward at the waist, in addition to neck being flexed.        I have reviewed and interpreted the following labs independently   Chemistry      Component Value Date/Time   NA 137 02/07/2017 0640   NA 140 01/18/2017 1029   K 3.7 02/07/2017 0640   K 4.1 01/18/2017 1029   CL 106 02/07/2017 0640   CL 107 06/24/2012 0859   CO2 25 02/07/2017 0640   CO2 25 01/18/2017 1029   BUN 21 (H) 02/07/2017 0640   BUN 21.7 01/18/2017 1029   CREATININE 1.01 (H) 02/07/2017 0640   CREATININE 1.0 01/18/2017 1029      Component Value Date/Time   CALCIUM 8.6 (L) 02/07/2017 0640   CALCIUM 9.3 01/18/2017 1029   ALKPHOS 51 02/05/2017 0631   ALKPHOS 54 01/18/2017 1029   AST 15 02/05/2017 0631   AST 14 01/18/2017 1029   ALT 12 (L) 02/05/2017 0631   ALT 11 01/18/2017 1029   BILITOT 0.8 02/05/2017 0631   BILITOT 0.31 01/18/2017 1029      Lab Results  Component Value Date   WBC 8.4 09/05/2020   HGB 12.1 09/05/2020   HCT 38.3 09/05/2020   MCV 99.2 09/05/2020   PLT 528 (H) 09/05/2020   Lab Results  Component Value Date   TSH 1.928 02/05/2017      Total  time spent on today's visit was 75 minutes, including both face-to-face  time and nonface-to-face time.  Time included that spent on review of records (prior notes available to me/labs/imaging if pertinent), discussing treatment and goals, answering patient's questions and coordinating care.  CC:  [name], FNP  [place] suggested general neurology

## 2021-01-14 ENCOUNTER — Ambulatory Visit: Payer: Medicare Other | Admitting: Neurology

## 2021-01-14 ENCOUNTER — Encounter: Payer: Self-pay | Admitting: Neurology

## 2021-01-14 ENCOUNTER — Other Ambulatory Visit: Payer: Self-pay

## 2021-01-14 VITALS — BP 136/68 | HR 89 | Ht 69.0 in | Wt 134.6 lb

## 2021-01-14 DIAGNOSIS — G20 Parkinson's disease: Secondary | ICD-10-CM

## 2021-01-14 NOTE — Patient Instructions (Addendum)
Week 1 : STOP Topamax   Then Start Carbidopa Levodopa as follows: Take 1/2 tablet three times daily, at least 30 minutes before meals (approximately 7am/11am/4pm), for one week Then take 1/2 tablet in the morning, 1/2 tablet in the afternoon, 1 tablet in the evening, at least 30 minutes before meals, for one week Then take 1/2 tablet in the morning, 1 tablet in the afternoon, 1 tablet in the evening, at least 30 minutes before meals, for one week Then take 1 tablet three times daily at 7am/11am/4pm, at least 30 minutes before meals   As a reminder, carbidopa/levodopa can be taken at the same time as a carbohydrate, but we like to have you take your pill either 30 minutes before a protein source or 1 hour after as protein can interfere with carbidopa/levodopa absorption.

## 2021-01-16 ENCOUNTER — Telehealth: Payer: Self-pay | Admitting: Neurology

## 2021-01-16 NOTE — Telephone Encounter (Signed)
Pt called in stating she thought there was supposed to be a carbidopa-levodopa prescription sent in to [facility] after her 01/14/21 appointment, but [facility] has not received anything yet.

## 2021-01-17 ENCOUNTER — Other Ambulatory Visit: Payer: Self-pay

## 2021-01-17 DIAGNOSIS — G250 Essential tremor: Secondary | ICD-10-CM

## 2021-01-17 DIAGNOSIS — G20 Parkinson's disease: Secondary | ICD-10-CM

## 2021-01-17 MED ORDER — CARBIDOPA-LEVODOPA 25-100 MG PO TABS: ORAL_TABLET | ORAL | 1 refills | Status: DC

## 2021-01-17 NOTE — Telephone Encounter (Signed)
Prescription has been sent to [facility] just need to call patient and let her know it has been sent

## 2021-01-24 ENCOUNTER — Other Ambulatory Visit: Payer: Self-pay | Admitting: Hematology and Oncology

## 2021-01-24 DIAGNOSIS — D473 Essential (hemorrhagic) thrombocythemia: Secondary | ICD-10-CM

## 2021-03-05 NOTE — Progress Notes (Incomplete)
Patient Care Team: [name], FNP as PCP - General (Family Medicine)  DIAGNOSIS: No diagnosis found.  CHIEF COMPLIANT: Follow-up of essential thrombocytosis on hydrea   INTERVAL HISTORY: Deanna Hill is a 80 y.o. with above-mentioned history of essential thrombocytosis who is currently on hydrea. She presents to the clinic today for follow-up.   ALLERGIES:  is allergic to iodine, prednisone, and shellfish allergy.  MEDICATIONS:  Current Outpatient Medications  Medication Sig Dispense Refill   acetaminophen (TYLENOL) 325 MG tablet Take 650 mg by mouth daily as needed for mild pain.     amLODipine (NORVASC) 5 MG tablet Take 1 tablet (5 mg total) by mouth daily.     aspirin EC 81 MG tablet Take 1 tablet (81 mg total) by mouth daily. 90 tablet 3   BIOTIN PO Take 1 capsule by mouth daily.     carbidopa-levodopa (SINEMET IR) 25-100 MG tablet Week 1: take .5 AM /afternoon/ PM Week 2 :take .5 AM/ afternoon/ 1 tab in PM Week 3: take .5 AM 1 tab in/afternoon 1 tab in PM Week 4 :take 1 tab AM /afternoon/ and PM 243 tablet 1   Cholecalciferol (VITAMIN D) 2000 UNITS CAPS Take 1 capsule by mouth daily.     hydrALAZINE (APRESOLINE) 25 MG tablet Take 1 tablet (25 mg total) daily as needed by mouth. If blood pressure over 413 systolic,take in the evening. 30 tablet 6   hydroxyurea (HYDREA) 500 MG capsule TAKE ONE CAPSULE TWICE WEEKLY 36 capsule 2   levothyroxine (SYNTHROID, LEVOTHROID) 88 MCG tablet Take 88 mcg by mouth daily.     meclizine (ANTIVERT) 25 MG tablet Take 1 tablet (25 mg total) by mouth 3 (three) times daily as needed for dizziness. 90 tablet 0   nitroGLYCERIN (NITROSTAT) 0.4 MG SL tablet Place 1 tablet (0.4 mg total) under the tongue every 5 (five) minutes as needed for chest pain. 25 tablet 6   traMADol (ULTRAM) 50 MG tablet Take 1 tablet by mouth every 12 (twelve) hours as needed.     No current facility-administered medications for this visit.    PHYSICAL  EXAMINATION: ECOG PERFORMANCE STATUS: {CHL ONC ECOG [phone]}  There were no vitals filed for this visit. There were no vitals filed for this visit.  LABORATORY DATA:  I have reviewed the data as listed CMP Latest Ref Rng & Units 02/07/2017 02/06/2017 02/05/2017  Glucose 65 - 99 mg/dL 101(H) 94 97  BUN 6 - 20 mg/dL 21(H) 14 12  Creatinine 0.44 - 1.00 mg/dL 1.01(H) 0.87 0.87  Sodium 135 - 145 mmol/L 137 139 141  Potassium 3.5 - 5.1 mmol/L 3.7 3.6 3.8  Chloride 101 - 111 mmol/L 106 108 110  CO2 22 - 32 mmol/L 25 24 22   Calcium 8.9 - 10.3 mg/dL 8.6(L) 8.9 8.7(L)  Total Protein 6.5 - 8.1 g/dL - - 6.5  Total Bilirubin 0.3 - 1.2 mg/dL - - 0.8  Alkaline Phos 38 - 126 U/L - - 51  AST 15 - 41 U/L - - 15  ALT 14 - 54 U/L - - 12(L)    Lab Results  Component Value Date   WBC 8.4 09/05/2020   HGB 12.1 09/05/2020   HCT 38.3 09/05/2020   MCV 99.2 09/05/2020   PLT 528 (H) 09/05/2020   NEUTROABS 6.1 09/05/2020    ASSESSMENT & PLAN:  No problem-specific Assessment & Plan notes found for this encounter.    No orders of the defined types were placed in this  encounter.  The patient has a good understanding of the overall plan. she agrees with it. she will call with any problems that may develop before the next visit here.  Total time spent: *** mins including face to face time and time spent for planning, charting and coordination of care  Rulon Eisenmenger, MD, MPH 03/05/2021  I, Thana Ates, am acting as scribe for Dr. Nicholas Lose.  {insert scribe attestation}

## 2021-03-06 NOTE — Assessment & Plan Note (Deleted)
Essential thrombocytosis with JAK2 mutation: Continue Hydrea 500 mg daily and also aspirin 325 mg by mouth once daily. Current dose: Hydroxyurea 500 mg Monday Wednesday Fridaychanged to twice a week from 03/08/2019  Hospitalization 02/02/2018to02/09/2016:Possible PRESPosterior reversible encephalopathy syndrome(due to seizure)VS Complicated Migraine  Patient is a been experiencing multiple comorbidities. Because the patient is experiencing hair loss and sheis currently onHydrea to twice a week.  Hydrea toxicities: 1. Fatigue  2. Hair loss  3. brittle nails  Hospitalization for posterior reversible encephalopathy syndrome She continues to suffer from tremors but is staying independent.  She is able to walk with the help of a cane.  Lab review: 09/05/2018: White count 8.5, hemoglobin 12.4, platelets 469, previously they were 465. 03/08/2019:WBC 7.2, hemoglobin 12.1, platelets 430 09/06/2019:WBC 9.3, MCV 101.3, platelets 541 03/07/2020: WBC 7.7, platelets 544 09/05/20: WBC 8.4, hemoglobin 12.1, platelets 528  Inflammation of the right lobe of the ear: I sent the prescription for amoxicillin for a week.  We decided to continue with the twice a week hydroxyurea. Return again in 6 months for follow-up with labs

## 2021-03-07 ENCOUNTER — Ambulatory Visit: Payer: Medicare Other | Admitting: Hematology and Oncology

## 2021-03-07 ENCOUNTER — Other Ambulatory Visit: Payer: Medicare Other

## 2021-04-02 NOTE — Assessment & Plan Note (Signed)
Essential thrombocytosis with JAK2 mutation: Continue Hydrea 500 mg daily and also aspirin 325 mg by mouth once daily. Current dose: Hydroxyurea 500 mg Monday Wednesday Fridaychanged to twice a week from 03/08/2019  Hospitalization 02/02/2018to02/09/2016:Possible PRESPosterior reversible encephalopathy syndrome(due to seizure)VS Complicated Migraine  Patient is a been experiencing multiple comorbidities. Because the patient is experiencing hair loss and sheis currently onHydrea to twice a week.  Hydrea toxicities: 1. Fatigue  2. Hair loss  3. brittle nails  Hospitalization for posterior reversible encephalopathy syndrome She continues to suffer from tremors but is staying independent.  She is able to walk with the help of a cane.  Lab review: 09/05/2018: White count 8.5, hemoglobin 12.4, platelets 469, previously they were 465. 03/08/2019:WBC 7.2, hemoglobin 12.1, platelets 430 09/06/2019:WBC 9.3, MCV 101.3, platelets 541 03/07/2020: WBC 7.7, platelets 544 09/05/20: WBC 8.4, hemoglobin 12.1, platelets 528  Inflammation of the right lobe of the ear: I sent the prescription for amoxicillin for a week.  We decided to continue with the twice a week hydroxyurea. Return again in 6 months for follow-up with labs    No orders of the defined types were placed in this encounter.  The patient has a good understanding of the overall plan. she agrees with it. she will call with any problems that may develop before the next visit here.

## 2021-04-02 NOTE — Progress Notes (Signed)
Patient Care Team: [name], FNP as PCP - General (Family Medicine)  DIAGNOSIS:    ICD-10-CM   1. Essential thrombocytosis (HCC)  D47.3 CBC with Differential ([facility] Only)      CHIEF COMPLIANT: Follow-up of essential thrombocytosis on hydrea   INTERVAL HISTORY: Deanna Hill is a 81 y.o. with above-mentioned history of essential thrombocytosis who is currently on hydrea. She presents to the clinic today for follow-up. She was diagnosed with Parkinson's disease and it is affecting her quality of life.  She was started on parkinsonian medications but it has not been helping her significantly.  She is tolerating Hydrea extremely well without any problems or concerns.  She takes it twice a week.  ALLERGIES:  is allergic to iodine, prednisone, and shellfish allergy.  MEDICATIONS:  Current Outpatient Medications  Medication Sig Dispense Refill   acetaminophen (TYLENOL) 325 MG tablet Take 650 mg by mouth daily as needed for mild pain.     amLODipine (NORVASC) 5 MG tablet Take 1 tablet (5 mg total) by mouth daily.     aspirin EC 81 MG tablet Take 1 tablet (81 mg total) by mouth daily. 90 tablet 3   BIOTIN PO Take 1 capsule by mouth daily.     carbidopa-levodopa (SINEMET IR) 25-100 MG tablet Week 1: take .5 AM /afternoon/ PM Week 2 :take .5 AM/ afternoon/ 1 tab in PM Week 3: take .5 AM 1 tab in/afternoon 1 tab in PM Week 4 :take 1 tab AM /afternoon/ and PM 243 tablet 1   Cholecalciferol (VITAMIN D) 2000 UNITS CAPS Take 1 capsule by mouth daily.     hydrALAZINE (APRESOLINE) 25 MG tablet Take 1 tablet (25 mg total) daily as needed by mouth. If blood pressure over 191 systolic,take in the evening. 30 tablet 6   hydroxyurea (HYDREA) 500 MG capsule TAKE ONE CAPSULE TWICE WEEKLY 36 capsule 2   levothyroxine (SYNTHROID, LEVOTHROID) 88 MCG tablet Take 88 mcg by mouth daily.     meclizine (ANTIVERT) 25 MG tablet Take 1 tablet (25 mg total) by mouth 3 (three) times daily as needed for  dizziness. 90 tablet 0   nitroGLYCERIN (NITROSTAT) 0.4 MG SL tablet Place 1 tablet (0.4 mg total) under the tongue every 5 (five) minutes as needed for chest pain. 25 tablet 6   traMADol (ULTRAM) 50 MG tablet Take 1 tablet by mouth every 12 (twelve) hours as needed.     No current facility-administered medications for this visit.    PHYSICAL EXAMINATION: ECOG PERFORMANCE STATUS: 1 - Symptomatic but completely ambulatory  Vitals:   04/03/21 1030  BP: 132/60  Pulse: 96  Resp: 18  Temp: (!) 97.5 F (36.4 C)  SpO2: 99%   Filed Weights   04/03/21 1030  Weight: 134 lb 9.6 oz (61.1 kg)    LABORATORY DATA:  I have reviewed the data as listed CMP Latest Ref Rng & Units 02/07/2017 02/06/2017 02/05/2017  Glucose 65 - 99 mg/dL 101(H) 94 97  BUN 6 - 20 mg/dL 21(H) 14 12  Creatinine 0.44 - 1.00 mg/dL 1.01(H) 0.87 0.87  Sodium 135 - 145 mmol/L 137 139 141  Potassium 3.5 - 5.1 mmol/L 3.7 3.6 3.8  Chloride 101 - 111 mmol/L 106 108 110  CO2 22 - 32 mmol/L 25 24 22   Calcium 8.9 - 10.3 mg/dL 8.6(L) 8.9 8.7(L)  Total Protein 6.5 - 8.1 g/dL - - 6.5  Total Bilirubin 0.3 - 1.2 mg/dL - - 0.8  Alkaline Phos 38 -  126 U/L - - 51  AST 15 - 41 U/L - - 15  ALT 14 - 54 U/L - - 12(L)    Lab Results  Component Value Date   WBC 8.0 04/03/2021   HGB 12.5 04/03/2021   HCT 40.2 04/03/2021   MCV 98.3 04/03/2021   PLT 540 (H) 04/03/2021   NEUTROABS 5.1 04/03/2021    ASSESSMENT & PLAN:  Essential thrombocytosis (HCC) Essential thrombocytosis with JAK2 mutation: Continue Hydrea 500 mg daily and also aspirin 325 mg by mouth once daily.  Current dose: Hydroxyurea 500 mg Monday Wednesday Friday changed to twice a week from 03/08/2019   Hospitalization 04/17/2016 to 04/22/2016: Possible PRES Posterior reversible encephalopathy syndrome (due to seizure) VS Complicated Migraine   Hydrea toxicities: 1. Fatigue   2. Hair loss   3. brittle nails    Hospitalization for posterior reversible encephalopathy  syndrome    Lab review:  09/05/2018: White count 8.5, hemoglobin 12.4, platelets 469, previously they were 465. 03/08/2019: WBC 7.2, hemoglobin 12.1, platelets 430 09/06/2019: WBC 9.3, MCV 101.3, platelets 541 03/07/2020: WBC 7.7, platelets 544 09/05/20: WBC 8.4, hemoglobin 12.1, platelets 528     We decided to continue with the twice a week hydroxyurea.  Recent diagnosis of Parkinson's disease: Affecting her ability to drive and get around.  Return again in 6 months for follow-up with labs       No orders of the defined types were placed in this encounter.   The patient has a good understanding of the overall plan. she agrees with it. she will call with any problems that may develop before the next visit here.    Orders Placed This Encounter  Procedures   CBC with Differential ([place] Only)    Standing Status:   Future    Standing Expiration Date:   04/03/2022   The patient has a good understanding of the overall plan. she agrees with it. she will call with any problems that may develop before the next visit here.  Total time spent: 20 mins including face to face time and time spent for planning, charting and coordination of care  Rulon Eisenmenger, MD, MPH 04/03/2021  I, Thana Ates, am acting as scribe for Dr. Nicholas Lose.  I have reviewed the above documentation for accuracy and completeness, and I agree with the above.

## 2021-04-03 ENCOUNTER — Inpatient Hospital Stay: Payer: Medicare Other | Attending: Hematology and Oncology

## 2021-04-03 ENCOUNTER — Other Ambulatory Visit: Payer: Self-pay

## 2021-04-03 ENCOUNTER — Inpatient Hospital Stay: Payer: Medicare Other | Admitting: Hematology and Oncology

## 2021-04-03 DIAGNOSIS — L659 Nonscarring hair loss, unspecified: Secondary | ICD-10-CM | POA: Insufficient documentation

## 2021-04-03 DIAGNOSIS — G20 Parkinson's disease: Secondary | ICD-10-CM | POA: Diagnosis not present

## 2021-04-03 DIAGNOSIS — D75839 Thrombocytosis, unspecified: Secondary | ICD-10-CM | POA: Diagnosis present

## 2021-04-03 DIAGNOSIS — L603 Nail dystrophy: Secondary | ICD-10-CM | POA: Diagnosis not present

## 2021-04-03 DIAGNOSIS — D473 Essential (hemorrhagic) thrombocythemia: Secondary | ICD-10-CM

## 2021-04-03 DIAGNOSIS — Z7982 Long term (current) use of aspirin: Secondary | ICD-10-CM | POA: Diagnosis not present

## 2021-04-03 DIAGNOSIS — R5383 Other fatigue: Secondary | ICD-10-CM | POA: Diagnosis not present

## 2021-04-03 LAB — CBC WITH DIFFERENTIAL (CANCER CENTER ONLY)
Abs Immature Granulocytes: 0.02 10*3/uL (ref 0.00–0.07)
Basophils Absolute: 0.1 10*3/uL (ref 0.0–0.1)
Basophils Relative: 1 %
Eosinophils Absolute: 1.3 10*3/uL — ABNORMAL HIGH (ref 0.0–0.5)
Eosinophils Relative: 16 %
HCT: 40.2 % (ref 36.0–46.0)
Hemoglobin: 12.5 g/dL (ref 12.0–15.0)
Immature Granulocytes: 0 %
Lymphocytes Relative: 10 %
Lymphs Abs: 0.8 10*3/uL (ref 0.7–4.0)
MCH: 30.6 pg (ref 26.0–34.0)
MCHC: 31.1 g/dL (ref 30.0–36.0)
MCV: 98.3 fL (ref 80.0–100.0)
Monocytes Absolute: 0.6 10*3/uL (ref 0.1–1.0)
Monocytes Relative: 8 %
Neutro Abs: 5.1 10*3/uL (ref 1.7–7.7)
Neutrophils Relative %: 65 %
Platelet Count: 540 10*3/uL — ABNORMAL HIGH (ref 150–400)
RBC: 4.09 MIL/uL (ref 3.87–5.11)
RDW: 13.4 % (ref 11.5–15.5)
WBC Count: 8 10*3/uL (ref 4.0–10.5)
nRBC: 0 % (ref 0.0–0.2)

## 2021-04-16 ENCOUNTER — Ambulatory Visit: Payer: Medicare Other | Admitting: Neurology

## 2021-04-23 ENCOUNTER — Other Ambulatory Visit: Payer: Self-pay | Admitting: Neurology

## 2021-04-23 DIAGNOSIS — G20 Parkinson's disease: Secondary | ICD-10-CM

## 2021-04-23 DIAGNOSIS — G250 Essential tremor: Secondary | ICD-10-CM

## 2021-07-21 NOTE — Progress Notes (Signed)
? ? ?Assessment/Plan:  ? ?1.  Parkinsons Disease ? -Discussed concept of levodopa resistant tremor.  However, she still does have rigidity, so we decided to increase her levodopa as follows: ? Week 1  ?Take carbidopa/levodopa, 1.5 tablets at 7am, 1 at 11am, 1 at 4pm ? ?Week 2 ?Take carbidopa/levodopa, 1.5 tablets at 7am, 1.5 tabs at 11am, 1 at 4pm ? ?Week 3 ?Take carbidopa/levodopa, 2 tablets at 7am, 1.5 tablets at 11am, 1 at 4pm ? ?Week 4 and beyond ?Take carbidopa/levodopa 25/100, 2 at 7am, 2 at 11am, 1 at 4pm ? ?2.  GAD/depression ? -Is a big issue in patient's sense of overall wellbeing, but really seems to be doing much better in that regard today. ? -Inability to tolerate many medications contributes to the fact that she has not been treated. ? ?3.  History of chronic vertigo/dizziness/nausea ? -She asked me today about her nausea, and I reminded her that she has had that long before he started levodopa and encouraged her to get back to GI. ? ?4.  History of PRES ? -Treated by Dr. Leonie Man in 2018 ? ? ?Subjective:  ? ?Deanna Hill was seen today in follow up for Parkinsons disease, diagnosed last visit.  My previous records were reviewed prior to todays visit as well as outside records available to me. Pt with daughter who supplements the history.   Last visit, we cautiously started the patient on levodopa, although there was some worry as the patient has been sensitive to many medications and tests discontinued them early.  I did discontinue her Topamax last visit, as she was only on 25 mg.  Pt had one fall since last visit - was in the middle of the night and fell on the toilet.  No fx but was bruised up.  No other falls.  Pt went to [facility] last week and she felt like she was going to pass out but it past before any thing else happened.  She thinks her BP dropped.  She does think that her legs are stronger than they were.    No hallucinations.  Occ visual distortion.  No constipation.  Mood  has been good. ? ?Current prescribed movement disorder medications: ?carbidopa/levodopa 25/100 tid ? ?Current/Previously tried tremor medications: Topamax 25 mg (was on it for headache post PRES but thought it helped tremor); clonazepam (hangover effect); propranolol; Zonegran is on her list of not tolerated medications, but records indicate that she was given it, but she never tried it because she was scared of potential side effects; primidone, tried at very low dose for just a few weeks and reported possible weakness ?  ? ?ALLERGIES:   ?Allergies  ?Allergen Reactions  ? Iodine Shortness Of Breath and Other (See Comments)  ?  Pt states "Deadly"  ? Prednisone   ?  Facial flushing and swelling  ? Shellfish Allergy Anaphylaxis and Other (See Comments)  ?  " I almost died once" ?  ? ? ?CURRENT MEDICATIONS:  ?Current Meds  ?Medication Sig  ? acetaminophen (TYLENOL) 325 MG tablet Take 650 mg by mouth daily as needed for mild pain.  ? amLODipine (NORVASC) 5 MG tablet Take 1 tablet (5 mg total) by mouth daily.  ? aspirin EC 81 MG tablet Take 1 tablet (81 mg total) by mouth daily.  ? BIOTIN PO Take 1 capsule by mouth daily.  ? carbidopa-levodopa (SINEMET IR) 25-100 MG tablet Take 1 tablet 3 times a day 7am/11am/4pm  ? Cholecalciferol (VITAMIN D)  2000 UNITS CAPS Take 1 capsule by mouth daily.  ? hydrALAZINE (APRESOLINE) 25 MG tablet Take 1 tablet (25 mg total) daily as needed by mouth. If blood pressure over 703 systolic,take in the evening.  ? hydroxyurea (HYDREA) 500 MG capsule TAKE ONE CAPSULE TWICE WEEKLY  ? levothyroxine (SYNTHROID, LEVOTHROID) 88 MCG tablet Take 88 mcg by mouth daily.  ? meclizine (ANTIVERT) 25 MG tablet Take 1 tablet (25 mg total) by mouth 3 (three) times daily as needed for dizziness.  ? nitroGLYCERIN (NITROSTAT) 0.4 MG SL tablet Place 1 tablet (0.4 mg total) under the tongue every 5 (five) minutes as needed for chest pain.  ? traMADol (ULTRAM) 50 MG tablet Take 1 tablet by mouth every 12 (twelve)  hours as needed.  ? ? ? ?Objective:  ? ?PHYSICAL EXAMINATION:   ? ?VITALS:   ?Vitals:  ? 07/22/21 1426  ?BP: (!) 145/65  ?Pulse: 72  ?Resp: 20  ?Weight: 140 lb (63.5 kg)  ?Height: '5\' 9"'$  (1.753 m)  ? ? ?GEN:  The patient appears stated age and is in NAD. ?HEENT:  Normocephalic, atraumatic.  The mucous membranes are moist. The superficial temporal arteries are without ropiness or tenderness. ?CV:  RRR ?Lungs:  CTAB ?Neck/HEME:  There are no carotid bruits bilaterally. ? ?Neurological examination: ? ?Orientation: The patient is alert and oriented x3. ?Cranial nerves: There is good facial symmetry with slight facial hypomimia. The speech is fluent and clear. Soft palate rises symmetrically and there is no tongue deviation. Hearing is intact to conversational tone. ?Sensation: Sensation is intact to light touch throughout ?Motor: Strength is at least antigravity x4. ? ?Movement examination: ?Tone: There is mod increased tone in the LUE and RLE but the tone on the RUE is good ?Abnormal movements: there is RUE rest tremor > LUE, independent of one another ?Coordination:  There is minimal decremation ?Gait and Station: The patient has minimal difficulty arising out of a deep-seated chair without the use of the hands.  stride length is much, much better than last visit and she ambulates better with the cane.   ? ?I have reviewed and interpreted the following labs independently ? ?  Chemistry   ?   ?Component Value Date/Time  ? NA 137 02/07/2017 0640  ? NA 140 01/18/2017 1029  ? K 3.7 02/07/2017 0640  ? K 4.1 01/18/2017 1029  ? CL 106 02/07/2017 0640  ? CL 107 06/24/2012 0859  ? CO2 25 02/07/2017 0640  ? CO2 25 01/18/2017 1029  ? BUN 21 (H) 02/07/2017 0640  ? BUN 21.7 01/18/2017 1029  ? CREATININE 1.01 (H) 02/07/2017 0640  ? CREATININE 1.0 01/18/2017 1029  ?    ?Component Value Date/Time  ? CALCIUM 8.6 (L) 02/07/2017 0640  ? CALCIUM 9.3 01/18/2017 1029  ? ALKPHOS 51 02/05/2017 0631  ? ALKPHOS 54 01/18/2017 1029  ? AST 15  02/05/2017 0631  ? AST 14 01/18/2017 1029  ? ALT 12 (L) 02/05/2017 0631  ? ALT 11 01/18/2017 1029  ? BILITOT 0.8 02/05/2017 0631  ? BILITOT 0.31 01/18/2017 1029  ?  ? ? ? ?Lab Results  ?Component Value Date  ? WBC 8.0 04/03/2021  ? HGB 12.5 04/03/2021  ? HCT 40.2 04/03/2021  ? MCV 98.3 04/03/2021  ? PLT 540 (H) 04/03/2021  ? ? ?Lab Results  ?Component Value Date  ? TSH 1.928 02/05/2017  ? ? ? ?Total time spent on today's visit was 42 minutes, including both face-to-face time and nonface-to-face time.  Time  included that spent on review of records (prior notes available to me/labs/imaging if pertinent), discussing treatment and goals, answering patient's questions and coordinating care. ? ?Cc:  [name], FNP ? ?

## 2021-07-22 ENCOUNTER — Ambulatory Visit: Payer: Medicare Other | Admitting: Neurology

## 2021-07-22 ENCOUNTER — Encounter: Payer: Self-pay | Admitting: Neurology

## 2021-07-22 DIAGNOSIS — G250 Essential tremor: Secondary | ICD-10-CM

## 2021-07-22 DIAGNOSIS — G20 Parkinson's disease: Secondary | ICD-10-CM

## 2021-07-22 MED ORDER — CARBIDOPA-LEVODOPA 25-100 MG PO TABS: ORAL_TABLET | ORAL | 1 refills | Status: DC

## 2021-07-22 NOTE — Patient Instructions (Signed)
Increase water intake to at least 60 oz per day ? ?Week 1  ?Take carbidopa/levodopa, 1.5 tablets at 7am, 1 at 11am, 1 at 4pm ? ?Week 2 ?Take carbidopa/levodopa, 1.5 tablets at 7am, 1.5 tabs at 11am, 1 at 4pm ? ?Week 3 ?Take carbidopa/levodopa, 2 tablets at 7am, 1.5 tablets at 11am, 1 at 4pm ? ?Week 4 and beyond ?Take carbidopa/levodopa 25/100, 2 at 7am, 2 at 11am, 1 at 4pm ?

## 2021-09-24 ENCOUNTER — Telehealth: Payer: Self-pay | Admitting: Hematology and Oncology

## 2021-09-24 NOTE — Telephone Encounter (Signed)
Rescheduled appointment per provider covering [facility]. Patient is aware of the changes made to her upcoming appointment. 

## 2021-10-01 ENCOUNTER — Other Ambulatory Visit: Payer: Medicare Other

## 2021-10-01 ENCOUNTER — Ambulatory Visit: Payer: Medicare Other | Admitting: Hematology and Oncology

## 2021-10-13 ENCOUNTER — Inpatient Hospital Stay: Payer: Medicare Other | Admitting: Hematology and Oncology

## 2021-10-13 ENCOUNTER — Inpatient Hospital Stay: Payer: Medicare Other

## 2021-10-29 NOTE — Progress Notes (Signed)
Patient Care Team: Nicholas Lose, MD as PCP - General (Hematology and Oncology)  DIAGNOSIS:  Encounter Diagnosis  Name Primary?   Essential thrombocytosis (HCC)     CHIEF COMPLIANT:  Follow-up of elevated platelets  INTERVAL HISTORY: Deanna Hill is a 81 y.o. with above-mentioned history of essential thrombocytosis who is currently on hydrea. She presents to the clinic today for follow-up. She complains of discoloration of hands. She said when they get cold they turns purple. Denies pain in hands.  Her platelet counts at her primary care office were 786.  She is currently take Hydrea twice a week.  We rechecked her labs today and it is 712.  Her major complaint today is Parkinson's disease.  She is struggling with severe tremors.   ALLERGIES:  is allergic to iodine, prednisone, shellfish allergy, covid-19 mrna vacc (moderna), gabapentin, meloxicam, other, topiramate, and zoloft [sertraline].  MEDICATIONS:  Current Outpatient Medications  Medication Sig Dispense Refill   acetaminophen (TYLENOL) 325 MG tablet Take 650 mg by mouth daily as needed for mild pain.     amLODipine (NORVASC) 5 MG tablet Take 1 tablet (5 mg total) by mouth daily.     aspirin EC 81 MG tablet Take 1 tablet (81 mg total) by mouth daily. 90 tablet 3   BIOTIN PO Take 1 capsule by mouth daily.     carbidopa-levodopa (SINEMET IR) 25-100 MG tablet 2 at 7am, 2 at 11am, 1 at 4pm 450 tablet 1   Cholecalciferol (VITAMIN D) 2000 UNITS CAPS Take 1 capsule by mouth daily.     hydrALAZINE (APRESOLINE) 25 MG tablet Take 1 tablet (25 mg total) daily as needed by mouth. If blood pressure over 510 systolic,take in the evening. 30 tablet 6   hydroxyurea (HYDREA) 500 MG capsule TAKE ONE CAPSULE TWICE WEEKLY 36 capsule 2   levothyroxine (SYNTHROID, LEVOTHROID) 88 MCG tablet Take 88 mcg by mouth daily.     meclizine (ANTIVERT) 25 MG tablet Take 1 tablet (25 mg total) by mouth 3 (three) times daily as needed for dizziness. 90  tablet 0   nitroGLYCERIN (NITROSTAT) 0.4 MG SL tablet Place 1 tablet (0.4 mg total) under the tongue every 5 (five) minutes as needed for chest pain. 25 tablet 6   traMADol (ULTRAM) 50 MG tablet Take 1 tablet by mouth every 12 (twelve) hours as needed.     No current facility-administered medications for this visit.    PHYSICAL EXAMINATION: ECOG PERFORMANCE STATUS: 1 - Symptomatic but completely ambulatory  Vitals:   10/30/21 1404  BP: (!) 114/55  Pulse: (!) 103  Resp: 18  Temp: (!) 97.3 F (36.3 C)  SpO2: 99%   Filed Weights   10/30/21 1404  Weight: 136 lb 11.2 oz (62 kg)      LABORATORY DATA:  I have reviewed the data as listed    Latest Ref Rng & Units 02/07/2017    6:40 AM 02/06/2017    3:53 AM 02/05/2017    6:31 AM  CMP  Glucose 65 - 99 mg/dL 101  94  97   BUN 6 - 20 mg/dL '21  14  12   '$ Creatinine 0.44 - 1.00 mg/dL 1.01  0.87  0.87   Sodium 135 - 145 mmol/L 137  139  141   Potassium 3.5 - 5.1 mmol/L 3.7  3.6  3.8   Chloride 101 - 111 mmol/L 106  108  110   CO2 22 - 32 mmol/L 25  24  22  Calcium 8.9 - 10.3 mg/dL 8.6  8.9  8.7   Total Protein 6.5 - 8.1 g/dL   6.5   Total Bilirubin 0.3 - 1.2 mg/dL   0.8   Alkaline Phos 38 - 126 U/L   51   AST 15 - 41 U/L   15   ALT 14 - 54 U/L   12     Lab Results  Component Value Date   WBC 10.2 10/30/2021   HGB 12.2 10/30/2021   HCT 38.5 10/30/2021   MCV 96.5 10/30/2021   PLT 712 (H) 10/30/2021   NEUTROABS 7.4 10/30/2021    ASSESSMENT & PLAN:  Essential thrombocytosis (HCC) Essential thrombocytosis with JAK2 mutation: Continue Hydrea 500 mg daily and also aspirin 325 mg by mouth once daily.  Current dose: Hydroxyurea 500 mg Monday Wednesday Friday changed to twice a week from 03/08/2019   Hospitalization 04/17/2016 to 04/22/2016: Possible PRES Posterior reversible encephalopathy syndrome (due to seizure) VS Complicated Migraine   Hydrea toxicities: 1. Fatigue   2. Hair loss   3. brittle nails     Hospitalization for posterior reversible encephalopathy syndrome     Lab review:  09/05/2018: White count 8.5, hemoglobin 12.4, platelets 469, previously they were 465. 03/08/2019: WBC 7.2, hemoglobin 12.1, platelets 430 09/06/2019: WBC 9.3, MCV 101.3, platelets 541 03/07/2020: WBC 7.7, platelets 544 09/05/20: WBC 8.4, hemoglobin 12.1, platelets 528 04/03/2021: WBC 8, hemoglobin 12.5, platelets 540 10/03/2021: WBC 10.6, hemoglobin 12.4, platelets 786 10/30/2021: WBC 10.2, hemoglobin 12.2, platelets 712  With the slight increase in the platelet counts, I recommended that we increase the Hydrea dosage to 3 times a week. Labs and follow-up in 6 months.  No orders of the defined types were placed in this encounter.  The patient has a good understanding of the overall plan. she agrees with it. she will call with any problems that may develop before the next visit here. Total time spent: 30 mins including face to face time and time spent for planning, charting and co-ordination of care   [name], MD 10/30/21    I Gardiner Coins am scribing for Dr. Lindi Adie  I have reviewed the above documentation for accuracy and completeness, and I agree with the above.

## 2021-10-30 ENCOUNTER — Inpatient Hospital Stay: Payer: Medicare Other

## 2021-10-30 ENCOUNTER — Other Ambulatory Visit: Payer: Self-pay

## 2021-10-30 ENCOUNTER — Inpatient Hospital Stay: Payer: Medicare Other | Attending: Hematology and Oncology | Admitting: Hematology and Oncology

## 2021-10-30 DIAGNOSIS — D473 Essential (hemorrhagic) thrombocythemia: Secondary | ICD-10-CM | POA: Insufficient documentation

## 2021-10-30 DIAGNOSIS — Z79899 Other long term (current) drug therapy: Secondary | ICD-10-CM | POA: Diagnosis not present

## 2021-10-30 DIAGNOSIS — Z7982 Long term (current) use of aspirin: Secondary | ICD-10-CM | POA: Insufficient documentation

## 2021-10-30 LAB — CBC WITH DIFFERENTIAL (CANCER CENTER ONLY)
Abs Immature Granulocytes: 0.03 10*3/uL (ref 0.00–0.07)
Basophils Absolute: 0.1 10*3/uL (ref 0.0–0.1)
Basophils Relative: 1 %
Eosinophils Absolute: 1.2 10*3/uL — ABNORMAL HIGH (ref 0.0–0.5)
Eosinophils Relative: 12 %
HCT: 38.5 % (ref 36.0–46.0)
Hemoglobin: 12.2 g/dL (ref 12.0–15.0)
Immature Granulocytes: 0 %
Lymphocytes Relative: 8 %
Lymphs Abs: 0.8 10*3/uL (ref 0.7–4.0)
MCH: 30.6 pg (ref 26.0–34.0)
MCHC: 31.7 g/dL (ref 30.0–36.0)
MCV: 96.5 fL (ref 80.0–100.0)
Monocytes Absolute: 0.7 10*3/uL (ref 0.1–1.0)
Monocytes Relative: 7 %
Neutro Abs: 7.4 10*3/uL (ref 1.7–7.7)
Neutrophils Relative %: 72 %
Platelet Count: 712 10*3/uL — ABNORMAL HIGH (ref 150–400)
RBC: 3.99 MIL/uL (ref 3.87–5.11)
RDW: 13.5 % (ref 11.5–15.5)
WBC Count: 10.2 10*3/uL (ref 4.0–10.5)
nRBC: 0 % (ref 0.0–0.2)

## 2021-10-30 NOTE — Assessment & Plan Note (Signed)
Essential thrombocytosis with JAK2 mutation: Continue Hydrea 500 mg daily and also aspirin 325 mg by mouth once daily. Current dose: Hydroxyurea 500 mg Monday Wednesday Fridaychanged to twice a week from 03/08/2019  Hospitalization 02/02/2018to02/09/2016:Possible PRESPosterior reversible encephalopathy syndrome(due to seizure)VS Complicated Migraine  Hydrea toxicities: 1. Fatigue  2. Hair loss  3. brittle nails  Hospitalization for posterior reversible encephalopathy syndrome   Lab review: 09/05/2018: White count 8.5, hemoglobin 12.4, platelets 469, previously they were 465. 03/08/2019:WBC 7.2, hemoglobin 12.1, platelets 430 09/06/2019:WBC 9.3, MCV 101.3, platelets 541 03/07/2020: WBC 7.7, platelets 544 09/05/20:WBC 8.4, hemoglobin 12.1, platelets 528 04/03/2021: WBC 8, hemoglobin 12.5, platelets 540  Platelet counts are relatively unchanged and did not need intervention at this time.

## 2021-12-18 DIAGNOSIS — Z23 Encounter for immunization: Secondary | ICD-10-CM | POA: Diagnosis not present

## 2021-12-19 ENCOUNTER — Other Ambulatory Visit: Payer: Self-pay | Admitting: *Deleted

## 2021-12-19 DIAGNOSIS — R6889 Other general symptoms and signs: Secondary | ICD-10-CM

## 2021-12-23 ENCOUNTER — Ambulatory Visit (HOSPITAL_COMMUNITY)
Admission: RE | Admit: 2021-12-23 | Discharge: 2021-12-23 | Disposition: A | Discharge status: Home / Self Care | Payer: Medicare Other | Source: Ambulatory Visit | Attending: Vascular Surgery | Admitting: Vascular Surgery

## 2021-12-23 DIAGNOSIS — R6889 Other general symptoms and signs: Secondary | ICD-10-CM | POA: Diagnosis not present

## 2021-12-25 ENCOUNTER — Encounter: Payer: Self-pay | Admitting: Vascular Surgery

## 2021-12-25 ENCOUNTER — Ambulatory Visit: Payer: Medicare Other | Admitting: Vascular Surgery

## 2021-12-25 VITALS — BP 130/75 | HR 86 | Temp 97.8°F | Resp 20 | Ht 69.0 in | Wt 136.0 lb

## 2021-12-25 DIAGNOSIS — I739 Peripheral vascular disease, unspecified: Secondary | ICD-10-CM

## 2021-12-25 NOTE — Progress Notes (Signed)
ASSESSMENT & PLAN   ABNORMAL ABI's: This patient had abnormal ABIs on a home test.  However subsequent formal arterial Doppler study showed no evidence of significant arterial insufficiency.  She has palpable pedal pulses on both sides.  She is not a smoker.  She denies any claudication or rest pain.  I have encouraged her to stay as active as possible.  I will be happy to see her back at any time if any new vascular issues arise.  REASON FOR CONSULT:    Abnormal ABIs on a home screening test.  The consult is requested by Genelle Bal, FNP.   HPI:   Deanna Hill is a 81 y.o. female who was sent for evaluation of peripheral arterial disease.  I have reviewed the records from the referring office.  The patient had a home health test for PVD done.  This suggested that the left lower extremity had an ABI 58% in the right lower extremity had an ABI of 50%.  Patient was set up for formal ABIs and the results of this are noted below.  On my history I do not get any symptoms consistent with claudication.  However her activity is limited by her Parkinson's disease and her back issues.  She denies any rest pain.  She denies any history of nonhealing ulcers.  Her risk factors for peripheral arterial disease include hypertension and hypercholesterolemia.  She denies any history of diabetes, family history of premature cardiovascular disease, or tobacco abuse.  Past Medical History:  Diagnosis Date   Anxiety    Coronary artery disease, non-occlusive 05/2013   Cath for Abn Myoview => mild CAD, 30% pCx. Otw normal Coronaries.  Normlal EF.   Essential tremor 06/19/2015   GERD (gastroesophageal reflux disease)    Hyperlipidemia    Hypertension    Hypothyroidism    IBS (irritable bowel syndrome)    Orthostatic hypotension 05/22/2016   Senile calcific aortic valve sclerosis 04/2016   2D echo showed aortic sclerosis but no stenosis.  Normal wall motion.  GR 2 DD   Thrombocytosis 03/11/2011    Family  History  Problem Relation Age of Onset   Skin cancer Mother    Gallstones Brother    Gallstones Brother    Pulmonary embolism Maternal Aunt     SOCIAL HISTORY: Social History   Tobacco Use   Smoking status: Never   Smokeless tobacco: Never  Substance Use Topics   Alcohol use: No    Allergies  Allergen Reactions   Iodine Shortness Of Breath and Other (See Comments)    Pt states "Deadly"   Prednisone     Facial flushing and swelling   Shellfish Allergy Anaphylaxis and Other (See Comments)    " I almost died once"    Covid-19 Mrna Vacc (Moderna) Other (See Comments)   Gabapentin Other (See Comments)   Meloxicam Other (See Comments)   Other Other (See Comments)   Topiramate Other (See Comments)   Zoloft [Sertraline] Other (See Comments)    Current Outpatient Medications  Medication Sig Dispense Refill   acetaminophen (TYLENOL) 325 MG tablet Take 650 mg by mouth daily as needed for mild pain.     amLODipine (NORVASC) 5 MG tablet Take 1 tablet (5 mg total) by mouth daily.     aspirin EC 81 MG tablet Take 1 tablet (81 mg total) by mouth daily. 90 tablet 3   BIOTIN PO Take 1 capsule by mouth daily.     carbidopa-levodopa (SINEMET IR) 25-100  MG tablet 2 at 7am, 2 at 11am, 1 at 4pm 450 tablet 1   Cholecalciferol (VITAMIN D) 2000 UNITS CAPS Take 1 capsule by mouth daily.     hydrALAZINE (APRESOLINE) 25 MG tablet Take 1 tablet (25 mg total) daily as needed by mouth. If blood pressure over 161 systolic,take in the evening. 30 tablet 6   hydroxyurea (HYDREA) 500 MG capsule TAKE ONE CAPSULE TWICE WEEKLY 36 capsule 2   levothyroxine (SYNTHROID, LEVOTHROID) 88 MCG tablet Take 88 mcg by mouth daily.     meclizine (ANTIVERT) 25 MG tablet Take 1 tablet (25 mg total) by mouth 3 (three) times daily as needed for dizziness. 90 tablet 0   nitroGLYCERIN (NITROSTAT) 0.4 MG SL tablet Place 1 tablet (0.4 mg total) under the tongue every 5 (five) minutes as needed for chest pain. 25 tablet 6    traMADol (ULTRAM) 50 MG tablet Take 1 tablet by mouth every 12 (twelve) hours as needed.     No current facility-administered medications for this visit.    REVIEW OF SYSTEMS:  '[X]'$  denotes positive finding, '[ ]'$  denotes negative finding Cardiac  Comments:  Chest pain or chest pressure:    Shortness of breath upon exertion:    Short of breath when lying flat:    Irregular heart rhythm:        Vascular    Pain in calf, thigh, or hip brought on by ambulation: x   Pain in feet at night that wakes you up from your sleep:     Blood clot in your veins:    Leg swelling:         Pulmonary    Oxygen at home:    Productive cough:     Wheezing:         Neurologic    Sudden weakness in arms or legs:  x   Sudden numbness in arms or legs:  x   Sudden onset of difficulty speaking or slurred speech:    Temporary loss of vision in one eye:     Problems with dizziness:         Gastrointestinal    Blood in stool:     Vomited blood:         Genitourinary    Burning when urinating:     Blood in urine:        Psychiatric    Major depression:         Hematologic    Bleeding problems:    Problems with blood clotting too easily:        Skin    Rashes or ulcers:        Constitutional    Fever or chills:    -  PHYSICAL EXAM:   Vitals:   12/25/21 1404  BP: 130/75  Pulse: 86  Resp: 20  Temp: 97.8 F (36.6 C)  SpO2: 95%  Weight: 136 lb (61.7 kg)  Height: '5\' 9"'$  (1.753 m)   Body mass index is 20.08 kg/m. GENERAL: The patient is a well-nourished female, in no acute distress. The vital signs are documented above. CARDIAC: There is a regular rate and rhythm.  VASCULAR: I do not detect carotid bruits. She has palpable radial pulses. She has palpable femoral, dorsalis pedis, and posterior tibial pulses bilaterally. She has no significant lower extremity swelling. PULMONARY: There is good air exchange bilaterally without wheezing or rales. ABDOMEN: Soft and non-tender with normal  pitched bowel sounds.  MUSCULOSKELETAL: There are no major deformities. NEUROLOGIC:  No focal weakness or paresthesias are detected. SKIN: There are no ulcers or rashes noted. PSYCHIATRIC: The patient has a normal affect.  DATA:    ARTERIAL DOPPLER STUDY: I have reviewed the arterial Doppler study that was done on 12/23/2021.  On the right side there was a biphasic dorsalis pedis and posterior tibial signal.  ABI was 100%.  Toe pressure could not be obtained as the patient had foot tremors.  On the left side there was a biphasic dorsalis pedis and posterior tibial signal.  ABI was 100%.  Again a toe pressure could not be obtained.   Deitra Mayo [facility]

## 2022-01-06 ENCOUNTER — Other Ambulatory Visit: Payer: Self-pay | Admitting: Hematology and Oncology

## 2022-01-06 DIAGNOSIS — D473 Essential (hemorrhagic) thrombocythemia: Secondary | ICD-10-CM

## 2022-01-09 ENCOUNTER — Other Ambulatory Visit: Payer: Self-pay | Admitting: *Deleted

## 2022-01-09 ENCOUNTER — Encounter: Payer: Self-pay | Admitting: *Deleted

## 2022-01-09 DIAGNOSIS — D473 Essential (hemorrhagic) thrombocythemia: Secondary | ICD-10-CM

## 2022-01-09 MED ORDER — HYDROXYUREA 500 MG PO CAPS: ORAL_CAPSULE | ORAL | 2 refills | Status: DC

## 2022-01-09 MED ORDER — HYDROXYUREA 500 MG PO CAPS: 500.0000 mg | ORAL_CAPSULE | ORAL | 3 refills | Status: DC

## 2022-01-09 NOTE — Progress Notes (Signed)
[facility] requesting updated prescription for Hydrea.  Pt is stating dose has been changed to 3 times a week.  RN reviewed with MD who confirmed pt to take Hydrea 500 mg p.o 3 times a week.  Updated prescription sent to pharmacy on file.

## 2022-01-15 ENCOUNTER — Other Ambulatory Visit: Payer: Self-pay | Admitting: Neurology

## 2022-01-15 DIAGNOSIS — G20A1 Parkinson's disease without dyskinesia, without mention of fluctuations: Secondary | ICD-10-CM

## 2022-01-15 DIAGNOSIS — G250 Essential tremor: Secondary | ICD-10-CM

## 2022-01-15 NOTE — Telephone Encounter (Signed)
Called and asked patient is she has enough Carbidopa Levodopa to last her until her appt on 01/20/22? Patient stated yes she has enough and can wait until her f/u to receive more medication.

## 2022-01-19 NOTE — Progress Notes (Unsigned)
Assessment/Plan:   1.  Parkinsons Disease  -Discussed concept of levodopa resistant tremor.   -Continue carbidopa/levodopa 25/100, 2 at 7am, 2 at 11am, 1 at 4pm -her Parkinsons Disease actually looks quite good.  Encouraged her to keep up the exercise.  I really think it is helping her.  2.  GAD/depression  -just started on lexapro  3.  History of chronic vertigo/dizziness/nausea  -She asked me today (and last visit)about her nausea, and I reminded her that she has had that long before she started levodopa and encouraged her to get back to GI.  We discussed that last visit as well  4.  History of PRES  -Treated by Dr. Leonie Man in 2018  5.  Essential thrombocytosis  -on hydroxyurea  6.  Insomnia  -doesn't want further meds  -recommended trial melatonin, 3 mg nightly  Subjective:   Deanna Hill was seen today in follow up for Parkinsons disease, diagnosed last visit.  My previous records were reviewed prior to todays visit as well as outside records available to me. Pt with daughter who supplements the history.   Last visit, we increased the patient's levodopa because of rigidity, but cautioned her that she would still have levodopa resistant tremor.  Pt states that she did okay with that but overall has been "miserable."  No falls since our last visit.  Tremors are a bit better and legs work a little better.  She thinks her exercises have helped.  Pt states that [facility] started on lexapro but she thinks it is causing nausea.  States that pcp decreased her BP meds as her BP was getting low.  She is having trouble sleeping but doesn't want any medication.  Current prescribed movement disorder medications: carbidopa/levodopa 25/100 2 at 7am, 2 at 11am, 1 at 4pm  Current/Previously tried tremor medications: Topamax 25 mg (was on it for headache post PRES but thought it helped tremor); clonazepam (hangover effect); propranolol; Zonegran is on her list of not  tolerated medications, but records indicate that she was given it, but she never tried it because she was scared of potential side effects; primidone, tried at very low dose for just a few weeks and reported possible weakness    ALLERGIES:   Allergies  Allergen Reactions   Iodine Shortness Of Breath and Other (See Comments)    Pt states "Deadly"   Prednisone     Facial flushing and swelling   Shellfish Allergy Anaphylaxis and Other (See Comments)    " I almost died once"    Covid-19 Mrna Vacc (Moderna) Other (See Comments)   Gabapentin Other (See Comments)   Meloxicam Other (See Comments)   Other Other (See Comments)   Topiramate Other (See Comments)   Zoloft [Sertraline] Other (See Comments)    CURRENT MEDICATIONS:  Current Meds  Medication Sig   acetaminophen (TYLENOL) 325 MG tablet Take 650 mg by mouth daily as needed for mild pain. Take extra strength tylenol 2 times daily   aspirin EC 81 MG tablet Take 1 tablet (81 mg total) by mouth daily.   BIOTIN PO Take 1 capsule by mouth daily.   Cholecalciferol (VITAMIN D) 2000 UNITS CAPS Take 1 capsule by mouth daily.   escitalopram (LEXAPRO) 5 MG tablet Take by mouth.   hydroxyurea (HYDREA) 500 MG capsule Take 1 capsule (500 mg total) by mouth 3 (three) times a week. May take with food to minimize GI side effects.   levothyroxine (SYNTHROID, LEVOTHROID) 88 MCG  tablet Take 88 mcg by mouth daily.   nitroGLYCERIN (NITROSTAT) 0.4 MG SL tablet Place 1 tablet (0.4 mg total) under the tongue every 5 (five) minutes as needed for chest pain.   Probiotic Product (ALIGN PO) Take by mouth.   [DISCONTINUED] carbidopa-levodopa (SINEMET IR) 25-100 MG tablet 2 at 7am, 2 at 11am, 1 at 4pm     Objective:   PHYSICAL EXAMINATION:    VITALS:   Vitals:   01/20/22 1514  BP: 129/72  Pulse: 77  SpO2: 95%  Weight: 135 lb (61.2 kg)  Height: '5\' 9"'$  (1.753 m)     GEN:  The patient appears stated age and is in NAD. HEENT:  Normocephalic,  atraumatic.  The mucous membranes are moist. The superficial temporal arteries are without ropiness or tenderness.   Neurological examination:  Orientation: The patient is alert and oriented x3. Cranial nerves: There is good facial symmetry with slight facial hypomimia. The speech is fluent and clear. Soft palate rises symmetrically and there is no tongue deviation. Hearing is intact to conversational tone. Sensation: Sensation is intact to light touch throughout Motor: Strength is at least antigravity x4.  Movement examination: Tone: There is nl tone in the bilateral UE.  There is mod increased tone in the RLE Abnormal movements: there is mild RUE/LUE rest tremor Coordination:  There is no decremation, with any form of RAMS, including alternating supination and pronation of the forearm, hand opening and closing, finger taps, heel taps and toe taps. Gait and Station: The patient easily arises out of the chair.  She ambulates very well with her cane.  She really is not shuffling at all.  I have reviewed and interpreted the following labs independently    Chemistry      Component Value Date/Time   NA 137 02/07/2017 0640   NA 140 01/18/2017 1029   K 3.7 02/07/2017 0640   K 4.1 01/18/2017 1029   CL 106 02/07/2017 0640   CL 107 06/24/2012 0859   CO2 25 02/07/2017 0640   CO2 25 01/18/2017 1029   BUN 21 (H) 02/07/2017 0640   BUN 21.7 01/18/2017 1029   CREATININE 1.01 (H) 02/07/2017 0640   CREATININE 1.0 01/18/2017 1029      Component Value Date/Time   CALCIUM 8.6 (L) 02/07/2017 0640   CALCIUM 9.3 01/18/2017 1029   ALKPHOS 51 02/05/2017 0631   ALKPHOS 54 01/18/2017 1029   AST 15 02/05/2017 0631   AST 14 01/18/2017 1029   ALT 12 (L) 02/05/2017 0631   ALT 11 01/18/2017 1029   BILITOT 0.8 02/05/2017 0631   BILITOT 0.31 01/18/2017 1029       Lab Results  Component Value Date   WBC 10.2 10/30/2021   HGB 12.2 10/30/2021   HCT 38.5 10/30/2021   MCV 96.5 10/30/2021   PLT 712  (H) 10/30/2021    Lab Results  Component Value Date   TSH 1.928 02/05/2017      Cc:  Cari Caraway, MD

## 2022-01-20 ENCOUNTER — Ambulatory Visit: Payer: Medicare Other | Admitting: Neurology

## 2022-01-20 ENCOUNTER — Encounter: Payer: Self-pay | Admitting: Neurology

## 2022-01-20 VITALS — BP 129/72 | HR 77 | Ht 69.0 in | Wt 135.0 lb

## 2022-01-20 DIAGNOSIS — G250 Essential tremor: Secondary | ICD-10-CM

## 2022-01-20 DIAGNOSIS — G20A1 Parkinson's disease without dyskinesia, without mention of fluctuations: Secondary | ICD-10-CM | POA: Diagnosis not present

## 2022-01-20 MED ORDER — CARBIDOPA-LEVODOPA 25-100 MG PO TABS: ORAL_TABLET | ORAL | 1 refills | Status: DC

## 2022-01-20 NOTE — Patient Instructions (Signed)
Local and Online Resources for Power over Parkinson's Group  November 2023    LOCAL [place] PARKINSON'S GROUPS   Power over Parkinson's Group:    Power Over Parkinson's Patient Education Group will be Wednesday, November 8th-*Hybrid meting*- in person at [facility] location and via [facility], 2:00-3:00 pm.   Starting in November, Power over [employer] and Care Partner Groups will meet together, with plans for separate break out session for caregivers (*this will be evolving over the next few months) Upcoming Power over Parkinson's Meetings/Care Partner Support:  2nd Wednesdays of the month at 2 pm:   November 8th, December 13th  [place] at [email or web address] if interested in participating in this group    [place] and Fall Prevention Workshop.  Thursday, November 9th 1-2pm, [employer] [place], [employer].  Register with Vonna Kotyk at [email or web address] or [phone] New PWR! Moves [employer] Instructor-Led Classes offering at [employer]!  TUESDAYS and Wednesdays 1-2 pm.   Contact Vonna Kotyk at  [email or web address]  or [phone] (Tuesday classes are modified for chair and standing only) Dance for Parkinson 's classes will be on Tuesdays 9:30am-10:30am starting October 3-December 12 with a break the week of November 21st. Located in the [employer] , in the first floor of the [employer] ([place].) To register:  [email or web address] or [phone]  Drumming for Parkinson's will be held on 2nd and 4th Mondays at 11:00 am.   Located at the [place] ([place]  [place] at [email or web address] or [phone]  Through support from the [place] for Parkinson's classes are free for both patients and caregivers.    [employer] Parkinson's Tai Chi  Class, Mondays at 11 am.  Call [phone] for details Parkinson's Holiday Party.  Wednesday, December 6th, 4:00-5:00 pm.  [facility] and Fitness.  RSVP to Garnetta Buddy at [phone] or [email or web address]   [place]:  [email or web address]  PD Health at Home continues:  Mindfulness Mondays, Wellness Wednesdays, Fitness Fridays   Upcoming Education:   Why Should you Participate in Parkinson's Research?  Wednesday, Nov. 29th,  1-2 pm  Expert Briefing:    Hallucinations and Delusions in Parkinson's.  Wednesday, Nov. 8th, 1-2 pm  Register for expert briefings (webinars) at [email or web address]  Please check out their website to sign up for emails and see their full online offerings      [place]:  [email or web address]   Third Thursday Webinars:  On the third Thursday of every month at 12 p.m. ET, join our free live webinars to learn about various aspects of living with Parkinson's disease and our work to speed medical breakthroughs.  Upcoming Webinar:  A Year Like No Other in Parkinson's Research:  2023 in Review.  Thursday, November 16th 12 noon. Check out additional information on their website to see their full online offerings    [facility]:  [email or web address]  Upcoming Webinar:   Stay tuned  Webinar Series:  Living with Parkinson's Meetup.   Third Thursdays each month, 3 pm  Care Partner Monthly Meetup.  With Robin Searing Phinney.  First Tuesday of each month, 2 pm  Check out additional information to Live Well Today on their website    Parkinson and Movement Disorders (PMD) Alliance:  www.[email or web address]  [employer]:  Online Education Events  Sign up for emails, which are sent weekly to give  you updates on programming and online offerings    Parkinson's Association of the [place]:  www.[email or web address]   Information on online support groups, education events, and online exercises including Yoga, Parkinson's exercises and more-LOTS of information on links to PD resources and online events  Virtual Support Group through Parkinson's Association of the [place]; next one is scheduled for Wednesday, November 1st  at 2 pm.  (These are typically scheduled for the 1st Wednesday of the month at 2 pm).  Visit website for details.   MOVEMENT AND EXERCISE OPPORTUNITIES  PWR! Moves Classes at [place].  Wednesdays 10 and 11 am.   Contact Amy Marriott, PT [email or web address] if interested.  NEW PWR! Moves Class offerings at [employer].  *TUESDAYS* and Wednesdays 1-2 pm.  Contact Vonna Kotyk at  [email or web address]    Parkinson's Wellness Recovery (PWR! Moves)  [email or web address]  Info on the PWR! Virtual Experience:  You will have access to our expertise?through self-assessment, guided plans that start with the PD-specific fundamentals, educational content, tips, Q&A with an expert, and a growing Art therapist of PD-specific pre-recorded and live exercise classes of varying types and intensity - both physical and cognitive! If that is not enough, we offer 1:1 wellness consultations (in-person or virtual) to personalize your PWR! Research scientist (medical).   [place] Fridays:   As part of the PD Health @ Home program, this free video series focuses each week on one aspect of fitness designed to support people living with Parkinson's.? These weekly videos highlight the [place] fitness guidelines for people with Parkinson's disease.  [email or web address]   Dance for PD website is offering free, live-stream classes throughout the week, as well as links to [employer] of classes:  [email or web address]  Virtual dance and Pilates for Parkinson's classes: Click on the Community Tab> Parkinson's  Movement Initiative Tab.  To register for classes and for more information, visit [email or web address] and click the "community" tab.   [employer] Parkinson's Cycling Classes   [employer]:  Thursdays @ Noon-Live classes at [employer] ([employer] at [email or web address]?or [phone])  [employer]: Virtual Classes Mondays and Thursdays Jeanette Caprice classes Tuesday, Wednesday and Thursday (contact [place] at [email or web address] ?or [phone])  [place]  Varied levels of classes are offered Tuesdays and Thursdays at [employer].   Stretching with Verdis Frederickson weekly class is also offered for people with Parkinson's  To observe a class or for more information, call [phone] or email Hezzie Bump at [email or web address]   ADDITIONAL SUPPORT AND RESOURCES  [employer]:Online Caregiver Education Opportunities:  www.well-[email or web address]/caregiver-education/[email or web address]-support-group.  You may also contact Vickki Muff at [email or web address] or [phone].     Well-Spring Navigator:  Just1Navigator program, a?free service to help individuals and families through the journey of determining care for older adults.  The "Navigator" is a Education officer, museum, Arnell Asal, who will speak with a prospective client and/or loved ones to provide an assessment of the situation and a set of recommendations for a personalized care plan -- all free of charge, and whether?[employer] offers the needed service or not. If the need is not a service we provide, we are well-connected with reputable programs in town that we can refer you to.  www.well-[email or web address] or to speak with the Navigator, call [phone].

## 2022-01-27 DIAGNOSIS — H40013 Open angle with borderline findings, low risk, bilateral: Secondary | ICD-10-CM | POA: Diagnosis not present

## 2022-01-27 DIAGNOSIS — H524 Presbyopia: Secondary | ICD-10-CM | POA: Diagnosis not present

## 2022-01-27 DIAGNOSIS — Z961 Presence of intraocular lens: Secondary | ICD-10-CM | POA: Diagnosis not present

## 2022-01-27 DIAGNOSIS — H04213 Epiphora due to excess lacrimation, bilateral lacrimal glands: Secondary | ICD-10-CM | POA: Diagnosis not present

## 2022-01-27 DIAGNOSIS — H04123 Dry eye syndrome of bilateral lacrimal glands: Secondary | ICD-10-CM | POA: Diagnosis not present

## 2022-02-03 DIAGNOSIS — E039 Hypothyroidism, unspecified: Secondary | ICD-10-CM | POA: Diagnosis not present

## 2022-02-03 DIAGNOSIS — I10 Essential (primary) hypertension: Secondary | ICD-10-CM | POA: Diagnosis not present

## 2022-02-03 DIAGNOSIS — N1831 Chronic kidney disease, stage 3a: Secondary | ICD-10-CM | POA: Diagnosis not present

## 2022-03-02 DIAGNOSIS — I10 Essential (primary) hypertension: Secondary | ICD-10-CM | POA: Diagnosis not present

## 2022-03-02 DIAGNOSIS — E039 Hypothyroidism, unspecified: Secondary | ICD-10-CM | POA: Diagnosis not present

## 2022-03-02 DIAGNOSIS — N1831 Chronic kidney disease, stage 3a: Secondary | ICD-10-CM | POA: Diagnosis not present

## 2022-03-02 DIAGNOSIS — E782 Mixed hyperlipidemia: Secondary | ICD-10-CM | POA: Diagnosis not present

## 2022-04-13 ENCOUNTER — Other Ambulatory Visit: Payer: Self-pay | Admitting: Hematology and Oncology

## 2022-04-30 ENCOUNTER — Other Ambulatory Visit: Payer: Self-pay | Admitting: *Deleted

## 2022-04-30 DIAGNOSIS — D473 Essential (hemorrhagic) thrombocythemia: Secondary | ICD-10-CM

## 2022-05-01 NOTE — Progress Notes (Signed)
Patient Care Team: Cari Caraway, MD as PCP - General (Family Medicine)  DIAGNOSIS: No diagnosis found.  SUMMARY OF ONCOLOGIC HISTORY: Oncology History   No history exists.    CHIEF COMPLIANT:   INTERVAL HISTORY: Deanna Hill is a   ALLERGIES:  is allergic to iodine, prednisone, shellfish allergy, covid-19 mrna vacc (moderna), gabapentin, meloxicam, other, topiramate, and zoloft [sertraline].  MEDICATIONS:  Current Outpatient Medications  Medication Sig Dispense Refill   acetaminophen (TYLENOL) 325 MG tablet Take 650 mg by mouth daily as needed for mild pain. Take extra strength tylenol 2 times daily     amLODipine (NORVASC) 5 MG tablet Take 1 tablet (5 mg total) by mouth daily. (Patient not taking: Reported on 01/20/2022)     aspirin EC 81 MG tablet Take 1 tablet (81 mg total) by mouth daily. 90 tablet 3   BIOTIN PO Take 1 capsule by mouth daily.     carbidopa-levodopa (SINEMET IR) 25-100 MG tablet 2 at 7am, 2 at 11am, 1 at 4pm 450 tablet 1   Cholecalciferol (VITAMIN D) 2000 UNITS CAPS Take 1 capsule by mouth daily.     escitalopram (LEXAPRO) 5 MG tablet Take by mouth.     hydrALAZINE (APRESOLINE) 25 MG tablet Take 1 tablet (25 mg total) daily as needed by mouth. If blood pressure over [id number] systolic,take in the evening. (Patient not taking: Reported on 01/20/2022) 30 tablet 6   hydroxyurea (HYDREA) 500 MG capsule TAKE ONE CAPSULE BY MOUTH THREE TIMES A WEEK. MAY take WITH FOOD 13 capsule 3   levothyroxine (SYNTHROID, LEVOTHROID) 88 MCG tablet Take 88 mcg by mouth daily.     meclizine (ANTIVERT) 25 MG tablet Take 1 tablet (25 mg total) by mouth 3 (three) times daily as needed for dizziness. (Patient not taking: Reported on 01/20/2022) 90 tablet 0   nitroGLYCERIN (NITROSTAT) 0.4 MG SL tablet Place 1 tablet (0.4 mg total) under the tongue every 5 (five) minutes as needed for chest pain. 25 tablet 6   Probiotic Product (ALIGN PO) Take by mouth.     traMADol (ULTRAM) 50 MG tablet  Take 1 tablet by mouth every 12 (twelve) hours as needed. (Patient not taking: Reported on 01/20/2022)     No current facility-administered medications for this visit.    PHYSICAL EXAMINATION: ECOG PERFORMANCE STATUS: {CHL ONC ECOG PS:[id number]}  There were no vitals filed for this visit. There were no vitals filed for this visit.  BREAST:*** No palpable masses or nodules in either right or left breasts. No palpable axillary supraclavicular or infraclavicular adenopathy no breast tenderness or nipple discharge. (exam performed in the presence of a chaperone)  LABORATORY DATA:  I have reviewed the data as listed    Latest Ref Rng & Units 02/07/2017    6:40 AM 02/06/2017    3:53 AM 02/05/2017    6:31 AM  CMP  Glucose 65 - 99 mg/dL 101  94  97   BUN 6 - 20 mg/dL 21  14  12   $ Creatinine 0.44 - 1.00 mg/dL 1.01  0.87  0.87   Sodium 135 - 145 mmol/L 137  139  141   Potassium 3.5 - 5.1 mmol/L 3.7  3.6  3.8   Chloride 101 - 111 mmol/L 106  108  110   CO2 22 - 32 mmol/L 25  24  22   $ Calcium 8.9 - 10.3 mg/dL 8.6  8.9  8.7   Total Protein 6.5 - 8.1 g/dL   6.5  Total Bilirubin 0.3 - 1.2 mg/dL   0.8   Alkaline Phos 38 - 126 U/L   51   AST 15 - 41 U/L   15   ALT 14 - 54 U/L   12     Lab Results  Component Value Date   WBC 10.2 10/30/2021   HGB 12.2 10/30/2021   HCT 38.5 10/30/2021   MCV 96.5 10/30/2021   PLT 712 (H) 10/30/2021   NEUTROABS 7.4 10/30/2021    ASSESSMENT & PLAN:  No problem-specific Assessment & Plan notes found for this encounter.    No orders of the defined types were placed in this encounter.  The patient has a good understanding of the overall plan. she agrees with it. she will call with any problems that may develop before the next visit here. Total time spent: 30 mins including face to face time and time spent for planning, charting and co-ordination of care   Suzzette Righter, [place] 05/01/22    I Gardiner Coins am acting as a Education administrator for [employer]  ***

## 2022-05-04 ENCOUNTER — Inpatient Hospital Stay: Payer: Medicare Other | Attending: Hematology and Oncology | Admitting: Hematology and Oncology

## 2022-05-04 ENCOUNTER — Inpatient Hospital Stay: Payer: Medicare Other

## 2022-05-04 VITALS — BP 141/69 | HR 93 | Temp 97.7°F | Resp 16 | Ht 69.0 in | Wt 131.8 lb

## 2022-05-04 DIAGNOSIS — L659 Nonscarring hair loss, unspecified: Secondary | ICD-10-CM | POA: Insufficient documentation

## 2022-05-04 DIAGNOSIS — L603 Nail dystrophy: Secondary | ICD-10-CM | POA: Insufficient documentation

## 2022-05-04 DIAGNOSIS — D473 Essential (hemorrhagic) thrombocythemia: Secondary | ICD-10-CM

## 2022-05-04 DIAGNOSIS — D75839 Thrombocytosis, unspecified: Secondary | ICD-10-CM | POA: Diagnosis not present

## 2022-05-04 DIAGNOSIS — G20A1 Parkinson's disease without dyskinesia, without mention of fluctuations: Secondary | ICD-10-CM | POA: Diagnosis not present

## 2022-05-04 DIAGNOSIS — Z79899 Other long term (current) drug therapy: Secondary | ICD-10-CM | POA: Diagnosis not present

## 2022-05-04 DIAGNOSIS — R5383 Other fatigue: Secondary | ICD-10-CM | POA: Insufficient documentation

## 2022-05-04 LAB — CBC WITH DIFFERENTIAL (CANCER CENTER ONLY)
Abs Immature Granulocytes: 0.06 10*3/uL (ref 0.00–0.07)
Basophils Absolute: 0.2 10*3/uL — ABNORMAL HIGH (ref 0.0–0.1)
Basophils Relative: 1 %
Eosinophils Absolute: 1.4 10*3/uL — ABNORMAL HIGH (ref 0.0–0.5)
Eosinophils Relative: 12 %
HCT: 38.5 % (ref 36.0–46.0)
Hemoglobin: 12.5 g/dL (ref 12.0–15.0)
Immature Granulocytes: 1 %
Lymphocytes Relative: 8 %
Lymphs Abs: 0.9 10*3/uL (ref 0.7–4.0)
MCH: 32.6 pg (ref 26.0–34.0)
MCHC: 32.5 g/dL (ref 30.0–36.0)
MCV: 100.5 fL — ABNORMAL HIGH (ref 80.0–100.0)
Monocytes Absolute: 0.7 10*3/uL (ref 0.1–1.0)
Monocytes Relative: 6 %
Neutro Abs: 8 10*3/uL — ABNORMAL HIGH (ref 1.7–7.7)
Neutrophils Relative %: 72 %
Platelet Count: 684 10*3/uL — ABNORMAL HIGH (ref 150–400)
RBC: 3.83 MIL/uL — ABNORMAL LOW (ref 3.87–5.11)
RDW: 13.9 % (ref 11.5–15.5)
WBC Count: 11.2 10*3/uL — ABNORMAL HIGH (ref 4.0–10.5)
nRBC: 0 % (ref 0.0–0.2)

## 2022-05-04 LAB — CMP (CANCER CENTER ONLY)
ALT: <5 U/L (ref 0–44)
AST: 11 U/L — ABNORMAL LOW (ref 15–41)
Albumin: 4.3 g/dL (ref 3.5–5.0)
Alkaline Phosphatase: 35 U/L — ABNORMAL LOW (ref 38–126)
Anion gap: 5 (ref 5–15)
BUN: 25 mg/dL — ABNORMAL HIGH (ref 8–23)
CO2: 31 mmol/L (ref 22–32)
Calcium: 9.1 mg/dL (ref 8.9–10.3)
Chloride: 105 mmol/L (ref 98–111)
Creatinine: 1.04 mg/dL — ABNORMAL HIGH (ref 0.44–1.00)
GFR, Estimated: 54 mL/min — ABNORMAL LOW (ref >60)
Glucose, Bld: 111 mg/dL — ABNORMAL HIGH (ref 70–99)
Potassium: 4.3 mmol/L (ref 3.5–5.1)
Sodium: 141 mmol/L (ref 135–145)
Total Bilirubin: 0.4 mg/dL (ref 0.3–1.2)
Total Protein: 6.6 g/dL (ref 6.5–8.1)

## 2022-05-04 NOTE — Assessment & Plan Note (Signed)
ssential thrombocytosis with JAK2 mutation: Continue Hydrea 500 mg daily and also aspirin 325 mg by mouth once daily.  Current dose: Hydroxyurea 500 mg Monday Wednesday Friday changed to twice a week from 03/08/2019   Hospitalization 04/17/2016 to 04/22/2016: Possible PRES Posterior reversible encephalopathy syndrome (due to seizure) VS Complicated Migraine   Hydrea toxicities: 1. Fatigue   2. Hair loss   3. brittle nails    Hospitalization for posterior reversible encephalopathy syndrome     Lab review:  09/05/2018: White count 8.5, hemoglobin 12.4, platelets 469, previously they were 465. 03/08/2019: WBC 7.2, hemoglobin 12.1, platelets 430 09/06/2019: WBC 9.3, MCV 101.3, platelets 541 03/07/2020: WBC 7.7, platelets 544 09/05/20: WBC 8.4, hemoglobin 12.1, platelets 528 04/03/2021: WBC 8, hemoglobin 12.5, platelets 540 10/03/2021: WBC 10.6, hemoglobin 12.4, platelets 786 10/30/2021: WBC 10.2, hemoglobin 12.2, platelets 712 05/04/2022   With the slight increase in the platelet counts, I recommended that we increase the Hydrea dosage to 3 times a week. Labs and follow-up in 6 months.

## 2022-05-06 DIAGNOSIS — I10 Essential (primary) hypertension: Secondary | ICD-10-CM | POA: Diagnosis not present

## 2022-05-06 DIAGNOSIS — Z0000 Encounter for general adult medical examination without abnormal findings: Secondary | ICD-10-CM | POA: Diagnosis not present

## 2022-05-06 DIAGNOSIS — G20A1 Parkinson's disease without dyskinesia, without mention of fluctuations: Secondary | ICD-10-CM | POA: Diagnosis not present

## 2022-05-06 DIAGNOSIS — E782 Mixed hyperlipidemia: Secondary | ICD-10-CM | POA: Diagnosis not present

## 2022-05-06 DIAGNOSIS — D473 Essential (hemorrhagic) thrombocythemia: Secondary | ICD-10-CM | POA: Diagnosis not present

## 2022-05-06 DIAGNOSIS — G903 Multi-system degeneration of the autonomic nervous system: Secondary | ICD-10-CM | POA: Diagnosis not present

## 2022-05-06 DIAGNOSIS — D500 Iron deficiency anemia secondary to blood loss (chronic): Secondary | ICD-10-CM | POA: Diagnosis not present

## 2022-05-06 DIAGNOSIS — Z23 Encounter for immunization: Secondary | ICD-10-CM | POA: Diagnosis not present

## 2022-05-06 DIAGNOSIS — N1831 Chronic kidney disease, stage 3a: Secondary | ICD-10-CM | POA: Diagnosis not present

## 2022-05-06 DIAGNOSIS — K219 Gastro-esophageal reflux disease without esophagitis: Secondary | ICD-10-CM | POA: Diagnosis not present

## 2022-05-14 DIAGNOSIS — E039 Hypothyroidism, unspecified: Secondary | ICD-10-CM | POA: Diagnosis not present

## 2022-05-14 DIAGNOSIS — I10 Essential (primary) hypertension: Secondary | ICD-10-CM | POA: Diagnosis not present

## 2022-05-14 DIAGNOSIS — E782 Mixed hyperlipidemia: Secondary | ICD-10-CM | POA: Diagnosis not present

## 2022-06-24 DIAGNOSIS — R1310 Dysphagia, unspecified: Secondary | ICD-10-CM | POA: Diagnosis not present

## 2022-06-24 DIAGNOSIS — K5900 Constipation, unspecified: Secondary | ICD-10-CM | POA: Diagnosis not present

## 2022-06-24 DIAGNOSIS — R110 Nausea: Secondary | ICD-10-CM | POA: Diagnosis not present

## 2022-06-24 DIAGNOSIS — K219 Gastro-esophageal reflux disease without esophagitis: Secondary | ICD-10-CM | POA: Diagnosis not present

## 2022-07-09 ENCOUNTER — Other Ambulatory Visit: Payer: Self-pay | Admitting: Neurology

## 2022-07-09 DIAGNOSIS — G250 Essential tremor: Secondary | ICD-10-CM

## 2022-07-13 DIAGNOSIS — N1831 Chronic kidney disease, stage 3a: Secondary | ICD-10-CM | POA: Diagnosis not present

## 2022-07-13 DIAGNOSIS — I10 Essential (primary) hypertension: Secondary | ICD-10-CM | POA: Diagnosis not present

## 2022-07-13 DIAGNOSIS — E782 Mixed hyperlipidemia: Secondary | ICD-10-CM | POA: Diagnosis not present

## 2022-07-13 DIAGNOSIS — E039 Hypothyroidism, unspecified: Secondary | ICD-10-CM | POA: Diagnosis not present

## 2022-07-17 ENCOUNTER — Other Ambulatory Visit: Payer: Self-pay | Admitting: Hematology and Oncology

## 2022-07-20 NOTE — Progress Notes (Unsigned)
Assessment/Plan:   1.  Parkinsons Disease  -Discussed concept of levodopa resistant tremor.   -Continue carbidopa/levodopa 25/100, 2 at 7am, 2 at 11am, 1 at 4pm -her Parkinsons Disease actually looks quite good.  Encouraged her to keep up the exercise.  I really think it is helping her.  2.  GAD/depression  -just started on lexapro  3.  History of chronic vertigo/dizziness/nausea  -She asked me today (and last visit)about her nausea, and I reminded her that she has had that long before she started levodopa and encouraged her to get back to GI.  We discussed that last visit as well  4.  History of PRES  -Treated by Dr. Leonie Man in 2018  5.  Essential thrombocytosis  -on hydroxyurea  6.  Insomnia  -doesn't want further meds  -recommended trial melatonin, 3 mg nightly  Subjective:   Deanna Hill was seen today in follow up for Parkinsons disease, diagnosed last visit.  My previous records were reviewed prior to todays visit as well as outside records available to me. Pt with daughter who supplements the history.   Last visit, we increased the patient's levodopa because of rigidity, but cautioned her that she would still have levodopa resistant tremor.  Pt states that she did okay with that but overall has been "miserable."  No falls since our last visit.  Tremors are a bit better and legs work a little better.  She thinks her exercises have helped.  Pt states that [place] started [place] lexapro but she thinks it is causing nausea.  States that pcp decreased her BP meds as her BP was getting low.  She is having trouble sleeping but doesn't want any medication.  Current prescribed movement disorder medications: carbidopa/levodopa 25/100 2 at 7am, 2 at 11am, 1 at 4pm  Current/Previously tried tremor medications: Topamax 25 mg (was on it for headache post PRES but thought it helped tremor); clonazepam (hangover effect); propranolol; Zonegran is on her list of not  tolerated medications, but records indicate that she was given it, but she never tried it because she was scared of potential side effects; primidone, tried at very low dose for just a few weeks and reported possible weakness    ALLERGIES:   Allergies  Allergen Reactions   Iodine Shortness Of Breath and Other (See Comments)    Pt states "Deadly"   Prednisone     Facial flushing and swelling   Shellfish Allergy Anaphylaxis and Other (See Comments)    " I almost died once"    Covid-19 Mrna Vacc (Moderna) Other (See Comments)   Gabapentin Other (See Comments)   Meloxicam Other (See Comments)   Other Other (See Comments)   Topiramate Other (See Comments)   Zoloft [Sertraline] Other (See Comments)    CURRENT MEDICATIONS:  Current Meds  Medication Sig   acetaminophen (TYLENOL) 325 MG tablet Take 650 mg by mouth daily as needed for mild pain. Take extra strength tylenol 2 times daily   aspirin EC 81 MG tablet Take 1 tablet (81 mg total) by mouth daily.   BIOTIN PO Take 1 capsule by mouth daily.   Cholecalciferol (VITAMIN D) 2000 UNITS CAPS Take 1 capsule by mouth daily.   escitalopram (LEXAPRO) 5 MG tablet Take by mouth.   hydroxyurea (HYDREA) 500 MG capsule Take 1 capsule (500 mg total) by mouth 3 (three) times a week. May take with food to minimize GI side effects.   levothyroxine (SYNTHROID, LEVOTHROID) 88 MCG  tablet Take 88 mcg by mouth daily.   nitroGLYCERIN (NITROSTAT) 0.4 MG SL tablet Place 1 tablet (0.4 mg total) under the tongue every 5 (five) minutes as needed for chest pain.   Probiotic Product (ALIGN PO) Take by mouth.   [DISCONTINUED] carbidopa-levodopa (SINEMET IR) 25-100 MG tablet 2 at 7am, 2 at 11am, 1 at 4pm     Objective:   PHYSICAL EXAMINATION:    VITALS:   Vitals:   01/20/22 1514  BP: 129/72  Pulse: 77  SpO2: 95%  Weight: 135 lb (61.2 kg)  Height: 5\' 9"  (1.753 m)     GEN:  The patient appears stated age and is in NAD. HEENT:  Normocephalic,  atraumatic.  The mucous membranes are moist. The superficial temporal arteries are without ropiness or tenderness.   Neurological examination:  Orientation: The patient is alert and oriented x3. Cranial nerves: There is good facial symmetry with slight facial hypomimia. The speech is fluent and clear. Soft palate rises symmetrically and there is no tongue deviation. Hearing is intact to conversational tone. Sensation: Sensation is intact to light touch throughout Motor: Strength is at least antigravity x4.  Movement examination: Tone: There is nl tone in the bilateral UE.  There is mod increased tone in the RLE Abnormal movements: there is mild RUE/LUE rest tremor Coordination:  There is no decremation, with any form of RAMS, including alternating supination and pronation of the forearm, hand opening and closing, finger taps, heel taps and toe taps. Gait and Station: The patient easily arises out of the chair.  She ambulates very well with her cane.  She really is not shuffling at all.  I have reviewed and interpreted the following labs independently    Chemistry      Component Value Date/Time   NA 137 02/07/2017 0640   NA 140 01/18/2017 1029   K 3.7 02/07/2017 0640   K 4.1 01/18/2017 1029   CL 106 02/07/2017 0640   CL 107 06/24/2012 0859   CO2 25 02/07/2017 0640   CO2 25 01/18/2017 1029   BUN 21 (H) 02/07/2017 0640   BUN 21.7 01/18/2017 1029   CREATININE 1.01 (H) 02/07/2017 0640   CREATININE 1.0 01/18/2017 1029      Component Value Date/Time   CALCIUM 8.6 (L) 02/07/2017 0640   CALCIUM 9.3 01/18/2017 1029   ALKPHOS 51 02/05/2017 0631   ALKPHOS 54 01/18/2017 1029   AST 15 02/05/2017 0631   AST 14 01/18/2017 1029   ALT 12 (L) 02/05/2017 0631   ALT 11 01/18/2017 1029   BILITOT 0.8 02/05/2017 0631   BILITOT 0.31 01/18/2017 1029       Lab Results  Component Value Date   WBC 10.2 10/30/2021   HGB 12.2 10/30/2021   HCT 38.5 10/30/2021   MCV 96.5 10/30/2021   PLT 712  (H) 10/30/2021    Lab Results  Component Value Date   TSH 1.928 02/05/2017   Total time spent on today's visit was *** minutes, including both face-to-face time and nonface-to-face time.  Time included that spent on review of records (prior notes available to me/labs/imaging if pertinent), discussing treatment and goals, answering patient's questions and coordinating care.    Cc:  Gweneth Dimitri, MD

## 2022-07-21 ENCOUNTER — Ambulatory Visit: Payer: Medicare Other | Admitting: Neurology

## 2022-07-21 ENCOUNTER — Encounter: Payer: Self-pay | Admitting: Neurology

## 2022-07-21 VITALS — BP 138/80 | HR 92 | Ht 69.0 in | Wt 123.4 lb

## 2022-07-21 DIAGNOSIS — G250 Essential tremor: Secondary | ICD-10-CM | POA: Diagnosis not present

## 2022-07-21 DIAGNOSIS — G20B2 Parkinson's disease with dyskinesia, with fluctuations: Secondary | ICD-10-CM

## 2022-07-21 MED ORDER — CARBIDOPA-LEVODOPA 25-100 MG PO TABS: ORAL_TABLET | ORAL | 0 refills | Status: DC

## 2022-07-22 DIAGNOSIS — I10 Essential (primary) hypertension: Secondary | ICD-10-CM | POA: Diagnosis not present

## 2022-08-02 ENCOUNTER — Other Ambulatory Visit: Payer: Self-pay

## 2022-08-02 ENCOUNTER — Emergency Department (HOSPITAL_COMMUNITY): Payer: Medicare Other

## 2022-08-02 ENCOUNTER — Observation Stay (HOSPITAL_BASED_OUTPATIENT_CLINIC_OR_DEPARTMENT_OTHER)
Admission: EM | Admit: 2022-08-02 | Discharge: 2022-08-03 | Disposition: A | Discharge status: Home / Self Care | Payer: Medicare Other | Attending: Obstetrics and Gynecology | Admitting: Obstetrics and Gynecology

## 2022-08-02 ENCOUNTER — Emergency Department (HOSPITAL_BASED_OUTPATIENT_CLINIC_OR_DEPARTMENT_OTHER): Payer: Medicare Other

## 2022-08-02 ENCOUNTER — Encounter (HOSPITAL_BASED_OUTPATIENT_CLINIC_OR_DEPARTMENT_OTHER): Payer: Self-pay | Admitting: Emergency Medicine

## 2022-08-02 ENCOUNTER — Observation Stay (HOSPITAL_COMMUNITY): Payer: Medicare Other

## 2022-08-02 DIAGNOSIS — I6381 Other cerebral infarction due to occlusion or stenosis of small artery: Secondary | ICD-10-CM | POA: Diagnosis not present

## 2022-08-02 DIAGNOSIS — D75839 Thrombocytosis, unspecified: Secondary | ICD-10-CM | POA: Diagnosis not present

## 2022-08-02 DIAGNOSIS — G20C Parkinsonism, unspecified: Secondary | ICD-10-CM | POA: Insufficient documentation

## 2022-08-02 DIAGNOSIS — Z7982 Long term (current) use of aspirin: Secondary | ICD-10-CM | POA: Diagnosis not present

## 2022-08-02 DIAGNOSIS — I639 Cerebral infarction, unspecified: Secondary | ICD-10-CM | POA: Diagnosis present

## 2022-08-02 DIAGNOSIS — D473 Essential (hemorrhagic) thrombocythemia: Secondary | ICD-10-CM | POA: Diagnosis present

## 2022-08-02 DIAGNOSIS — I10 Essential (primary) hypertension: Secondary | ICD-10-CM | POA: Diagnosis not present

## 2022-08-02 DIAGNOSIS — G20A1 Parkinson's disease without dyskinesia, without mention of fluctuations: Secondary | ICD-10-CM | POA: Diagnosis present

## 2022-08-02 DIAGNOSIS — I2510 Atherosclerotic heart disease of native coronary artery without angina pectoris: Secondary | ICD-10-CM | POA: Insufficient documentation

## 2022-08-02 DIAGNOSIS — R519 Headache, unspecified: Principal | ICD-10-CM | POA: Diagnosis present

## 2022-08-02 DIAGNOSIS — E039 Hypothyroidism, unspecified: Secondary | ICD-10-CM | POA: Diagnosis not present

## 2022-08-02 DIAGNOSIS — R42 Dizziness and giddiness: Secondary | ICD-10-CM | POA: Diagnosis not present

## 2022-08-02 DIAGNOSIS — I159 Secondary hypertension, unspecified: Secondary | ICD-10-CM | POA: Diagnosis not present

## 2022-08-02 DIAGNOSIS — I160 Hypertensive urgency: Secondary | ICD-10-CM | POA: Diagnosis present

## 2022-08-02 DIAGNOSIS — R9082 White matter disease, unspecified: Secondary | ICD-10-CM | POA: Diagnosis not present

## 2022-08-02 DIAGNOSIS — Z79899 Other long term (current) drug therapy: Secondary | ICD-10-CM | POA: Diagnosis not present

## 2022-08-02 HISTORY — DX: Parkinson's disease without dyskinesia, without mention of fluctuations: G20.A1

## 2022-08-02 LAB — CBC WITH DIFFERENTIAL/PLATELET
Abs Immature Granulocytes: 0.05 10*3/uL (ref 0.00–0.07)
Basophils Absolute: 0.1 10*3/uL (ref 0.0–0.1)
Basophils Relative: 1 %
Eosinophils Absolute: 0.3 10*3/uL (ref 0.0–0.5)
Eosinophils Relative: 2 %
HCT: 44.2 % (ref 36.0–46.0)
Hemoglobin: 13.8 g/dL (ref 12.0–15.0)
Immature Granulocytes: 0 %
Lymphocytes Relative: 4 %
Lymphs Abs: 0.5 10*3/uL — ABNORMAL LOW (ref 0.7–4.0)
MCH: 30.5 pg (ref 26.0–34.0)
MCHC: 31.2 g/dL (ref 30.0–36.0)
MCV: 97.6 fL (ref 80.0–100.0)
Monocytes Absolute: 0.5 10*3/uL (ref 0.1–1.0)
Monocytes Relative: 4 %
Neutro Abs: 10.4 10*3/uL — ABNORMAL HIGH (ref 1.7–7.7)
Neutrophils Relative %: 89 %
Platelets: 670 10*3/uL — ABNORMAL HIGH (ref 150–400)
RBC: 4.53 MIL/uL (ref 3.87–5.11)
RDW: 13.8 % (ref 11.5–15.5)
WBC: 11.8 10*3/uL — ABNORMAL HIGH (ref 4.0–10.5)
nRBC: 0 % (ref 0.0–0.2)

## 2022-08-02 LAB — BASIC METABOLIC PANEL
Anion gap: 9 (ref 5–15)
BUN: 21 mg/dL (ref 8–23)
CO2: 28 mmol/L (ref 22–32)
Calcium: 9.1 mg/dL (ref 8.9–10.3)
Chloride: 104 mmol/L (ref 98–111)
Creatinine, Ser: 0.76 mg/dL (ref 0.44–1.00)
GFR, Estimated: >60 mL/min (ref >60)
Glucose, Bld: 118 mg/dL — ABNORMAL HIGH (ref 70–99)
Potassium: 4 mmol/L (ref 3.5–5.1)
Sodium: 141 mmol/L (ref 135–145)

## 2022-08-02 LAB — TROPONIN I (HIGH SENSITIVITY): Troponin I (High Sensitivity): 6 ng/L (ref <18)

## 2022-08-02 MED ORDER — ASPIRIN 81 MG PO TBEC
81.0000 mg | DELAYED_RELEASE_TABLET | Freq: Every day | ORAL | Status: DC
  Administered 2022-08-03: 81 mg via ORAL
  Filled 2022-08-02: qty 1

## 2022-08-02 MED ORDER — ACETAMINOPHEN 500 MG PO TABS
1000.0000 mg | ORAL_TABLET | Freq: Once | ORAL | Status: AC
  Administered 2022-08-02: 1000 mg via ORAL
  Filled 2022-08-02: qty 2

## 2022-08-02 MED ORDER — SENNOSIDES-DOCUSATE SODIUM 8.6-50 MG PO TABS: 1.0000 | ORAL_TABLET | Freq: Every evening | ORAL | Status: DC | PRN

## 2022-08-02 MED ORDER — LOSARTAN POTASSIUM 25 MG PO TABS
12.5000 mg | ORAL_TABLET | Freq: Every day | ORAL | Status: DC
  Administered 2022-08-03: 12.5 mg via ORAL
  Filled 2022-08-02 (×2): qty 0.5

## 2022-08-02 MED ORDER — CARBIDOPA-LEVODOPA 25-100 MG PO TABS
2.0000 | ORAL_TABLET | ORAL | Status: DC
  Administered 2022-08-03 (×3): 2 via ORAL
  Filled 2022-08-02 (×3): qty 2

## 2022-08-02 MED ORDER — HYDROXYUREA 500 MG PO CAPS
500.0000 mg | ORAL_CAPSULE | ORAL | Status: DC
  Administered 2022-08-03: 500 mg via ORAL
  Filled 2022-08-02: qty 1

## 2022-08-02 MED ORDER — LACTATED RINGERS IV BOLUS: 250.0000 mL | Freq: Once | INTRAVENOUS | Status: DC

## 2022-08-02 MED ORDER — ENOXAPARIN SODIUM 40 MG/0.4ML IJ SOSY
40.0000 mg | PREFILLED_SYRINGE | Freq: Every day | INTRAMUSCULAR | Status: DC
  Administered 2022-08-03: 40 mg via SUBCUTANEOUS
  Filled 2022-08-02: qty 0.4

## 2022-08-02 MED ORDER — SODIUM CHLORIDE 0.9% FLUSH
3.0000 mL | Freq: Two times a day (BID) | INTRAVENOUS | Status: DC
  Administered 2022-08-03 (×2): 3 mL via INTRAVENOUS

## 2022-08-02 MED ORDER — ACETAMINOPHEN 650 MG RE SUPP: 650.0000 mg | Freq: Four times a day (QID) | RECTAL | Status: DC | PRN

## 2022-08-02 MED ORDER — BISACODYL 5 MG PO TBEC: 5.0000 mg | DELAYED_RELEASE_TABLET | Freq: Every day | ORAL | Status: DC | PRN

## 2022-08-02 MED ORDER — METOCLOPRAMIDE HCL 5 MG/ML IJ SOLN
10.0000 mg | Freq: Once | INTRAMUSCULAR | Status: AC
  Administered 2022-08-02: 10 mg via INTRAVENOUS
  Filled 2022-08-02: qty 2

## 2022-08-02 MED ORDER — CARBIDOPA-LEVODOPA 25-100 MG PO TABS
1.0000 | ORAL_TABLET | Freq: Once | ORAL | Status: AC
  Administered 2022-08-02: 1 via ORAL
  Filled 2022-08-02: qty 1

## 2022-08-02 MED ORDER — LEVOTHYROXINE SODIUM 88 MCG PO TABS
88.0000 ug | ORAL_TABLET | Freq: Every day | ORAL | Status: DC
  Administered 2022-08-03: 88 ug via ORAL
  Filled 2022-08-02: qty 1

## 2022-08-02 MED ORDER — LORAZEPAM 0.5 MG PO TABS: 0.5000 mg | ORAL_TABLET | ORAL | Status: DC | PRN

## 2022-08-02 MED ORDER — HYDROCODONE-ACETAMINOPHEN 5-325 MG PO TABS: 1.0000 | ORAL_TABLET | ORAL | Status: DC | PRN

## 2022-08-02 MED ORDER — LABETALOL HCL 5 MG/ML IV SOLN
10.0000 mg | Freq: Once | INTRAVENOUS | Status: AC
  Administered 2022-08-03: 10 mg via INTRAVENOUS
  Filled 2022-08-02: qty 4

## 2022-08-02 MED ORDER — LABETALOL HCL 5 MG/ML IV SOLN
20.0000 mg | Freq: Once | INTRAVENOUS | Status: AC
  Administered 2022-08-02: 20 mg via INTRAVENOUS
  Filled 2022-08-02: qty 4

## 2022-08-02 MED ORDER — LABETALOL HCL 5 MG/ML IV SOLN: 10.0000 mg | INTRAVENOUS | Status: DC | PRN

## 2022-08-02 MED ORDER — DIPHENHYDRAMINE HCL 50 MG/ML IJ SOLN
12.5000 mg | Freq: Once | INTRAMUSCULAR | Status: AC
  Administered 2022-08-02: 12.5 mg via INTRAVENOUS
  Filled 2022-08-02: qty 1

## 2022-08-02 MED ORDER — ONDANSETRON HCL 4 MG PO TABS: 4.0000 mg | ORAL_TABLET | Freq: Four times a day (QID) | ORAL | Status: DC | PRN

## 2022-08-02 MED ORDER — ACETAMINOPHEN 325 MG PO TABS
650.0000 mg | ORAL_TABLET | Freq: Four times a day (QID) | ORAL | Status: DC | PRN
  Administered 2022-08-03: 650 mg via ORAL
  Filled 2022-08-02: qty 2

## 2022-08-02 MED ORDER — ONDANSETRON HCL 4 MG/2ML IJ SOLN: 4.0000 mg | Freq: Four times a day (QID) | INTRAMUSCULAR | Status: DC | PRN

## 2022-08-02 MED ORDER — MAGNESIUM SULFATE 2 GM/50ML IV SOLN
2.0000 g | Freq: Once | INTRAVENOUS | Status: AC
  Administered 2022-08-02: 2 g via INTRAVENOUS
  Filled 2022-08-02: qty 50

## 2022-08-02 MED ORDER — GADOBUTROL 1 MMOL/ML IV SOLN
5.0000 mL | Freq: Once | INTRAVENOUS | Status: AC | PRN
  Administered 2022-08-03: 5 mL via INTRAVENOUS

## 2022-08-02 MED ORDER — PROCHLORPERAZINE EDISYLATE 10 MG/2ML IJ SOLN
10.0000 mg | Freq: Once | INTRAMUSCULAR | Status: AC
  Administered 2022-08-02: 10 mg via INTRAVENOUS
  Filled 2022-08-02: qty 2

## 2022-08-02 NOTE — ED Provider Notes (Signed)
[place] EMERGENCY DEPARTMENT AT [facility] Provider Note   CSN: [id number] Arrival date & time: 08/02/22  9562     History  Chief Complaint  Patient presents with   headache/dizzy    LARAVEN GEORGIA is a 82 y.o. female.  HPI   82 year old female with medical history significant for PR ES, HLD, HTN, aortic valve sclerosis, Parkinson's disease who presents to the emergency department with a headache and lightheadedness.  The patient states that on Friday she had gradual onset of a headache that persisted all day yesterday.  She denies any sudden onset or maximal onset of her headache.  Today she felt lightheaded and had vision changes.  She endorsed blurred vision that was worsening as her headache worsened.  She feels extremely fatigued.  She feels like symptoms are similar to the last time she had PR ES.  She denies any full vision loss.  She denies any chest pain, shortness of breath.  She denies any focal neurologic deficits.  She does state that 2 weeks ago she had left arm and left leg numbness that has since resolved.  She continues to endorse a headache.  She states she has a history of headaches.    Home Medications Prior to Admission medications   Medication Sig Start Date End Date Taking? Authorizing Provider  acetaminophen (TYLENOL) 325 MG tablet Take 650 mg by mouth daily as needed for mild pain. Take extra strength tylenol 2 times daily    [provider]  aspirin EC 81 MG tablet Take 1 tablet (81 mg total) by mouth daily. 11/22/17   Marykay Lex, MD  BIOTIN PO Take 1 capsule by mouth daily.    [provider]  candesartan (ATACAND) 4 MG tablet Take 4 mg by mouth daily. 04/23/22   [provider]  carbidopa-levodopa (SINEMET IR) 25-100 MG tablet Take 2 at 7am/2 at 11am/1 at 3pm 07/21/22   Tat, Octaviano Batty, DO  Cholecalciferol (VITAMIN D) 2000 UNITS CAPS Take 1 capsule by mouth daily.    [provider]  escitalopram (LEXAPRO)  5 MG tablet Take by mouth. 01/01/22   [provider]  hydroxyurea (HYDREA) 500 MG capsule TAKE ONE CAPSULE BY MOUTH THREE TIMES A WEEK. MAY take with food. 07/17/22   Serena Croissant, MD  levothyroxine (SYNTHROID, LEVOTHROID) 88 MCG tablet Take 88 mcg by mouth daily.    [provider]  losartan (COZAAR) 25 MG tablet Take 12.5 mg by mouth daily. 04/13/22   [provider]  nitroGLYCERIN (NITROSTAT) 0.4 MG SL tablet Place 1 tablet (0.4 mg total) under the tongue every 5 (five) minutes as needed for chest pain. 03/02/19   Marykay Lex, MD  Probiotic Product (ALIGN PO) Take by mouth.    [provider]      Allergies    Iodine, Prednisone, Shellfish allergy, Covid-19 mrna vacc (moderna), Gabapentin, Meloxicam, Other, Topiramate, and Zoloft [sertraline]    Review of Systems   Review of Systems  All other systems reviewed and are negative.   Physical Exam Updated Vital Signs BP (!) 184/72 (BP Location: Right Arm)   Pulse 86   Temp 98.4 F (36.9 C) (Oral)   Resp 17   SpO2 100%  Physical Exam Vitals and nursing note reviewed.  Constitutional:      General: She is not in acute distress.    Appearance: She is well-developed.  HENT:     Head: Normocephalic and atraumatic.  Eyes:  Conjunctiva/sclera: Conjunctivae normal.     Comments: Visual field and acuity testing difficult as patient has blurred vision all fields, no field cut  Neck:     Comments: No meningismus Cardiovascular:     Rate and Rhythm: Normal rate and regular rhythm.     Heart sounds: No murmur heard. Pulmonary:     Effort: Pulmonary effort is normal. No respiratory distress.     Breath sounds: Normal breath sounds.  Abdominal:     Palpations: Abdomen is soft.     Tenderness: There is no abdominal tenderness.  Musculoskeletal:        General: No swelling.     Cervical back: Full passive range of motion without pain and neck supple.  Skin:    General: Skin is warm and dry.      Capillary Refill: Capillary refill takes less than 2 seconds.  Neurological:     Mental Status: She is alert.     Comments: MENTAL STATUS EXAM:    Orientation: Alert and oriented to person, place and time.  Memory: Cooperative, follows commands well.  Language: Speech is clear and language is normal.   CRANIAL NERVES:    CN 2 (Optic): Visual fields intact to confrontation.  CN 3,4,6 (EOM): Pupils equal and reactive to light. Full extraocular eye movement without nystagmus.  CN 5 (Trigeminal): Facial sensation is normal, no weakness of masticatory muscles.  CN 7 (Facial): No facial weakness or asymmetry, questionable RFD present CN 8 (Auditory): Auditory acuity grossly normal.  CN 9,10 (Glossophar): The uvula is midline, the palate elevates symmetrically.  CN 11 (spinal access): Normal sternocleidomastoid and trapezius strength.  CN 12 (Hypoglossal): The tongue is midline. No atrophy or fasciculations.[name]   MOTOR:  Muscle Strength: 5/5RUE, 5/5LUE, 5/5RLE, 5/5LLE.   COORDINATION:   Intact finger-to-nose, no tremor, no pronator drift.   SENSATION:   Intact to light touch all four extremities.    Psychiatric:        Mood and Affect: Mood normal.     ED Results / Procedures / Treatments   Labs (all labs ordered are listed, but only abnormal results are displayed) Labs Reviewed  BASIC METABOLIC PANEL - Abnormal; Notable for the following components:      Result Value   Glucose, Bld 118 (*)    All other components within normal limits  CBC WITH DIFFERENTIAL/PLATELET - Abnormal; Notable for the following components:   WBC 11.8 (*)    Platelets 670 (*)    Neutro Abs 10.4 (*)    Lymphs Abs 0.5 (*)    All other components within normal limits  TROPONIN I (HIGH SENSITIVITY)    EKG EKG Interpretation  Date/Time:  Sunday Aug 02 2022 10:05:58 EDT Ventricular Rate:  87 PR Interval:  133 QRS Duration: 95 QT Interval:  378 QTC Calculation: 455 R Axis:   -50 Text  Interpretation: Sinus rhythm Left anterior fascicular block Abnormal R-wave progression, early transition Confirmed by Ernie Avena ([id number]) on 08/02/2022 11:59:34 AM  Radiology MR BRAIN WO CONTRAST  Result Date: 08/02/2022 CLINICAL DATA:  Concern for PRES EXAM: MRI HEAD WITHOUT CONTRAST TECHNIQUE: Multiplanar, multiecho pulse sequences of the brain and surrounding structures were obtained without intravenous contrast. COMPARISON:  MRI head February 10, 2017. FINDINGS: Brain: New focal area of T2 hyperintensity in the right aspect of the splenium of the corpus callosum with probable faint peripheral restricted diffusion. No other areas of restricted diffusion. Remote but interval white matter infarcts in the left frontal and parietal  lobes. No evidence of acute hemorrhage, a mass lesion, midline shift or hydrocephalus. Vascular: Major arterial flow voids are maintained at the skull base. Skull and upper cervical spine: Normal marrow signal. Sinuses/Orbits: Left maxillary sinus retention cyst. Otherwise, sinuses are largely clear. No acute orbital findings. Other: No mastoid effusions IMPRESSION: 1. New focal area of T2 hyperintensity in the right aspect of the splenium of the corpus callosum with probable faint peripheral restricted diffusion, most likely an evolving subacute infarct. 2. Remote but interval white matter infarcts in the left frontal and parietal lobes. 3. Otherwise, no evidence of acute intracranial abnormality. Electronically Signed   By: Feliberto Harts M.D.   On: 08/02/2022 17:55   CT HEAD WO CONTRAST  Result Date: 08/02/2022 CLINICAL DATA:  82 year old female with history of sudden onset of severe headache. EXAM: CT HEAD WITHOUT CONTRAST TECHNIQUE: Contiguous axial images were obtained from the base of the skull through the vertex without intravenous contrast. RADIATION DOSE REDUCTION: This exam was performed according to the departmental dose-optimization program which includes  automated exposure control, adjustment of the mA and/or kV according to patient size and/or use of iterative reconstruction technique. COMPARISON:  Head CT 04/14/2019. FINDINGS: Brain: New well-defined low-attenuation in the left frontal lobe white matter (axial image 16 of series 2), compatible with an old lacunar infarct. Patchy and confluent areas of decreased attenuation are noted throughout the deep and periventricular white matter of the cerebral hemispheres bilaterally, compatible with chronic microvascular ischemic disease. 8 x 5 mm calcification along the posterior aspect of the falx cerebri to the right of midline (axial image 22 series 2), similar to prior studies, compatible with known meningioma. No evidence of acute infarction, hemorrhage, hydrocephalus, extra-axial collection or new mass lesion/mass effect. Vascular: No hyperdense vessel or unexpected calcification. Skull: Normal. Negative for fracture or focal lesion. Sinuses/Orbits: No acute finding. Other: None. IMPRESSION: 1. No acute intracranial abnormalities. 2. Mild chronic microvascular ischemic changes and old lacunar infarct in the left frontal lobe white matter (new compared to the prior study, but not acute). 3. Small parafalcine meningioma in the right parietal region, measuring 8 x 5 mm, similar to prior examinations. Electronically Signed   By: Trudie Reed M.D.   On: 08/02/2022 13:07   DG Chest Portable 1 View  Result Date: 08/02/2022 CLINICAL DATA:  Hypertensive crisis.  Headache and dizziness. EXAM: PORTABLE CHEST 1 VIEW COMPARISON:  04/18/2016 FINDINGS: The heart size and mediastinal contours are within normal limits. Aortic atherosclerotic calcification incidentally noted. Both lungs are clear. The visualized skeletal structures are unremarkable. IMPRESSION: No active disease. Electronically Signed   By: Danae Orleans M.D.   On: 08/02/2022 12:06    Procedures Procedures    Medications Ordered in ED Medications   LORazepam (ATIVAN) tablet 0.5 mg (has no administration in time range)  labetalol (NORMODYNE) injection 10 mg (has no administration in time range)  metoCLOPramide (REGLAN) injection 10 mg (10 mg Intravenous Given 08/02/22 1220)  diphenhydrAMINE (BENADRYL) injection 12.5 mg (12.5 mg Intravenous Given 08/02/22 1212)  magnesium sulfate IVPB 2 g 50 mL (0 g Intravenous Stopped 08/02/22 1637)  prochlorperazine (COMPAZINE) injection 10 mg (10 mg Intravenous Given 08/02/22 1409)  acetaminophen (TYLENOL) tablet 1,000 mg (1,000 mg Oral Given 08/02/22 1408)  labetalol (NORMODYNE) injection 20 mg (20 mg Intravenous Given 08/02/22 1409)  carbidopa-levodopa (SINEMET IR) 25-100 MG per tablet immediate release 1 tablet (1 tablet Oral Given 08/02/22 1603)    ED Course/ Medical Decision Making/ A&P Clinical Course as of  08/02/22 2244  Sun Aug 02, 2022  1353 BP(!): 215/94 [JL]  1354 BP(!): 187/87 [JL]    Clinical Course User Index [JL] Ernie Avena, MD                             Medical Decision Making Amount and/or Complexity of Data Reviewed Labs: ordered. Radiology: ordered.  Risk OTC drugs. Prescription drug management.    82 year old female with medical history significant for PR ES, HLD, HTN, aortic valve sclerosis, Parkinson's disease who presents to the emergency department with a headache and lightheadedness.  The patient states that on Friday she had gradual onset of a headache that persisted all day yesterday.  She denies any sudden onset or maximal onset of her headache.  Today she felt lightheaded and had vision changes.  She endorsed blurred vision that was worsening as her headache worsened.  She feels extremely fatigued.  She feels like symptoms are similar to the last time she had PR ES.  She denies any full vision loss.  She denies any chest pain, shortness of breath.  She denies any focal neurologic deficits.  She does state that 2 weeks ago she had left arm and left leg numbness that  has since resolved.  She continues to endorse a headache.  She states she has a history of headaches.  On arrival, the patient was afebrile, heart rate 92, RR 13, hypertensive BP 215/94, saturating 90% on room air.  Physical exam significant for neurologic exam with a questionable right facial droop, no clear weakness, blurred vision all visual fields, no clear double vision.  No clear visual field cut.  Differential diagnosis includes hypertensive emergency, CVA, recent TIA, less likely meningitis, encephalitis, subarachnoid hemorrhage or ruptured aneurysm.  Considered migraine headache.  IV access was obtained and the patient was administered IV Reglan, IV Benadryl, oral Tylenol, IV Compazine.  She endorsed persistent headache.  EKG revealed sinus rhythm, ventricular rate 87, left anterior fascicular block, no evidence of STEMI.  Chest x-ray was performed which revealed no evidence of pulmonary edema with no active disease.  CT head was performed which revealed no acute intracranial abnormalities with chronic microvascular ischemic changes present.  Old lacunar infarct present which is new compared to prior imaging.  Given the patient's history of PR ES, developing vision changes, severe headache, concern for developing PR ES.  Discussed the care with Dr. Laurence Slate of on-call neurology, he recommended transfer to [facility], ER for MRI brain.  Recommendations included: "Given history of posterior reversible encephalopathy syndrome, recommend MRI brain without contrast-ED to ED transfer. Blood pressure goal less than 220-once the MRI is done, if no stroke, then treat as hypertensive emergency with 20% reduction of blood pressure systolic per day. If there is a stroke, allow permissive hypertension to 220s. Please call the inpatient neurology service for formal consultation if required after MRI."  Given the patient's hypertension, the patient was administered IV labetalol.  Discussed care of the  patient with Dr. Gwenlyn Fudge who accepted the patient in transfer ER to ER to [facility].  The patient was subsequently transferred in stable condition.    Final Clinical Impression(s) / ED Diagnoses Final diagnoses:  Secondary hypertension  Acute nonintractable headache, unspecified headache type    Rx / DC Orders ED Discharge Orders     None         Ernie Avena, MD 08/02/22 2245

## 2022-08-02 NOTE — Consult Note (Signed)
NEURO HOSPITALIST CONSULT NOTE   Requestig physician: Dr. Elpidio Anis  Reason for Consult: Severe headache in the context of hypertensive urgency  History obtained from:  Patient and Chart     HPI:                                                                                                                                          Deanna Hill is an 82 y.o. female with a PMHx of CAD, PRES, essential tremor, HLD, HTN, orthostatic hypotension, hypothyroidism, Parkinson's disease, senile calcific aortic valve sclerosis and thrombocytosis who presented to the ED on Sunday morning with malaise, fatigue, headache and lightheadedness. Symptoms began on Friday with gradual onset of headache that continued to persist through Sunday morning. She denies having any vision loss. SBP in the ED was in the 250s. No meningismus was noted on EDP exam. She was afebrile but with elevated WBC of 13.5. Consistent with her history of thrombocytosis, her platelets were elevated at 682.    Home medications include ASA.   Past Medical History:  Diagnosis Date   Anxiety    Coronary artery disease, non-occlusive 05/2013   Cath for Abn Myoview => mild CAD, 30% pCx. Otw normal Coronaries.  Normlal EF.   Essential tremor 06/19/2015   GERD (gastroesophageal reflux disease)    Hyperlipidemia    Hypertension    Hypothyroidism    IBS (irritable bowel syndrome)    Orthostatic hypotension 05/22/2016   Parkinson's disease    Senile calcific aortic valve sclerosis 04/2016   2D echo showed aortic sclerosis but no stenosis.  Normal wall motion.  GR 2 DD   Thrombocytosis 03/11/2011    Past Surgical History:  Procedure Laterality Date   ABDOMINAL HYSTERECTOMY  1979   CAROTID DOPPLERS  01/2017   normal Carotid, Sub-Clavian & Vertebral Arteries.   CATARACT EXTRACTION Bilateral 2015   Dr. Hollie Salk, LAPAROSCOPIC  2001   LEFT HEART CATHETERIZATION WITH CORONARY ANGIOGRAM N/A 06/08/2013    Procedure: LEFT HEART CATHETERIZATION WITH CORONARY ANGIOGRAM;  Surgeon: Othella Boyer, MD;  Location: [facility] CATH LAB;  Service: Cardiovascular: Dr. Donnie Aho: Mild CAD, 30%pCx.  Otherwise nonobstructive disease.  Normal LV function.   TOE SURGERY Left 1994   TONSILLECTOMY  1952   TRANSTHORACIC ECHOCARDIOGRAM  02/'18, 11/'18   a) Normal EF of 60-65%.  Normal wall motion.  GR 2 DD.  Aortic sclerosis without stenosis;; b) Mod LVH. EF 60-65%. Ao Sclerosis w/o AS. Pseudonormal - Gr 2 DD.    Family History  Problem Relation Age of Onset   Skin cancer Mother    Gallstones Brother    Gallstones Brother    Pulmonary embolism Maternal Aunt             Social History:  reports that she has never smoked. She has never used smokeless tobacco. She reports that she does not drink alcohol and does not use drugs.  Allergies  Allergen Reactions   Iodine Shortness Of Breath and Other (See Comments)    Pt states "Deadly"   Prednisone     Facial flushing and swelling   Shellfish Allergy Anaphylaxis and Other (See Comments)    " I almost died once"    Covid-19 Mrna Vacc (Moderna) Other (See Comments)   Gabapentin Other (See Comments)   Meloxicam Other (See Comments)   Other Other (See Comments)   Topiramate Other (See Comments)   Zoloft [Sertraline] Other (See Comments)    MEDICATIONS:                                                                                                                     No current facility-administered medications on file prior to encounter.   Current Outpatient Medications on File Prior to Encounter  Medication Sig Dispense Refill   acetaminophen (TYLENOL) 325 MG tablet Take 650 mg by mouth daily as needed for mild pain. Take extra strength tylenol 2 times daily     aspirin EC 81 MG tablet Take 1 tablet (81 mg total) by mouth daily. 90 tablet 3   BIOTIN PO Take 1 capsule by mouth daily.     candesartan (ATACAND) 4 MG tablet Take 4 mg by mouth daily.      carbidopa-levodopa (SINEMET IR) 25-100 MG tablet Take 2 at 7am/2 at 11am/1 at 3pm 450 tablet 0   Cholecalciferol (VITAMIN D) 2000 UNITS CAPS Take 1 capsule by mouth daily.     escitalopram (LEXAPRO) 5 MG tablet Take by mouth.     hydroxyurea (HYDREA) 500 MG capsule TAKE ONE CAPSULE BY MOUTH THREE TIMES A WEEK. MAY take with food. 13 capsule 3   levothyroxine (SYNTHROID, LEVOTHROID) 88 MCG tablet Take 88 mcg by mouth daily.     losartan (COZAAR) 25 MG tablet Take 12.5 mg by mouth daily.     nitroGLYCERIN (NITROSTAT) 0.4 MG SL tablet Place 1 tablet (0.4 mg total) under the tongue every 5 (five) minutes as needed for chest pain. 25 tablet 6   Probiotic Product (ALIGN PO) Take by mouth.       ROS:  As per HPI.    Blood pressure (!) 184/72, pulse 86, temperature 98.4 F (36.9 C), temperature source Oral, resp. rate 17, SpO2 100 %.   General Examination:                                                                                                       Physical Exam  HEENT-  [place]/AT    Lungs- Respirations unlabored Extremities- No edema  Neurological Examination Mental Status: Awake and alert. Speech is fluent with intact naming and comprehension. Good insight. Pleasant and cooperative. Oriented x 5. Cranial Nerves: II: Temporal visual fields intact with no extinction to DSS. PERRL  III,IV, VI: Left sided ptosis. Eyes are conjugate. EOMI.  V: Temp sensation subjectively decreased on the right.   VII: Smile symmetric VIII: Hearing intact to voice IX,X: No hoarseness XI: Symmetric shoulder shrug XII: Midline tongue extension Motor: BUE 5/5 proximally and distally BLE 5/5 proximally and distally  No pronator drift.  Rest and action tremor to BUE is noted.  Mild cogwheel rigidity to BUE.  Sensory: Temp and light touch intact throughout, bilaterally. No  extinction to DSS.  Deep Tendon Reflexes: 1+ and symmetric bilateral brachioradialis, biceps and patellae. Toes equivocal bilaterally.  Cerebellar: No ataxia with FNF bilaterally  Gait: Deferred   Lab Results: Basic Metabolic Panel: Recent Labs  Lab 08/02/22 1205  NA 141  K 4.0  CL 104  CO2 28  GLUCOSE 118*  BUN 21  CREATININE 0.76  CALCIUM 9.1    CBC: Recent Labs  Lab 08/02/22 1205  WBC 11.8*  NEUTROABS 10.4*  HGB 13.8  HCT 44.2  MCV 97.6  PLT 670*    Cardiac Enzymes: No results for input(s): "CKTOTAL", "CKMB", "CKMBINDEX", "TROPONINI" in the last 168 hours.  Lipid Panel: No results for input(s): "CHOL", "TRIG", "HDL", "CHOLHDL", "VLDL", "LDLCALC" in the last 168 hours.  Imaging: MR BRAIN WO CONTRAST  Result Date: 08/02/2022 CLINICAL DATA:  Concern for PRES EXAM: MRI HEAD WITHOUT CONTRAST TECHNIQUE: Multiplanar, multiecho pulse sequences of the brain and surrounding structures were obtained without intravenous contrast. COMPARISON:  MRI head February 10, 2017. FINDINGS: Brain: New focal area of T2 hyperintensity in the right aspect of the splenium of the corpus callosum with probable faint peripheral restricted diffusion. No other areas of restricted diffusion. Remote but interval white matter infarcts in the left frontal and parietal lobes. No evidence of acute hemorrhage, a mass lesion, midline shift or hydrocephalus. Vascular: Major arterial flow voids are maintained at the skull base. Skull and upper cervical spine: Normal marrow signal. Sinuses/Orbits: Left maxillary sinus retention cyst. Otherwise, sinuses are largely clear. No acute orbital findings. Other: No mastoid effusions IMPRESSION: 1. New focal area of T2 hyperintensity in the right aspect of the splenium of the corpus callosum with probable faint peripheral restricted diffusion, most likely an evolving subacute infarct. 2. Remote but interval white matter infarcts in the left frontal and parietal lobes. 3.  Otherwise, no evidence of acute intracranial abnormality. Electronically Signed   By: Feliberto Harts M.D.   On: 08/02/2022 17:55  CT HEAD WO CONTRAST  Result Date: 08/02/2022 CLINICAL DATA:  82 year old female with history of sudden onset of severe headache. EXAM: CT HEAD WITHOUT CONTRAST TECHNIQUE: Contiguous axial images were obtained from the base of the skull through the vertex without intravenous contrast. RADIATION DOSE REDUCTION: This exam was performed according to the departmental dose-optimization program which includes automated exposure control, adjustment of the mA and/or kV according to patient size and/or use of iterative reconstruction technique. COMPARISON:  Head CT 04/14/2019. FINDINGS: Brain: New well-defined low-attenuation in the left frontal lobe white matter (axial image 16 of series 2), compatible with an old lacunar infarct. Patchy and confluent areas of decreased attenuation are noted throughout the deep and periventricular white matter of the cerebral hemispheres bilaterally, compatible with chronic microvascular ischemic disease. 8 x 5 mm calcification along the posterior aspect of the falx cerebri to the right of midline (axial image 22 series 2), similar to prior studies, compatible with known meningioma. No evidence of acute infarction, hemorrhage, hydrocephalus, extra-axial collection or new mass lesion/mass effect. Vascular: No hyperdense vessel or unexpected calcification. Skull: Normal. Negative for fracture or focal lesion. Sinuses/Orbits: No acute finding. Other: None. IMPRESSION: 1. No acute intracranial abnormalities. 2. Mild chronic microvascular ischemic changes and old lacunar infarct in the left frontal lobe white matter (new compared to the prior study, but not acute). 3. Small parafalcine meningioma in the right parietal region, measuring 8 x 5 mm, similar to prior examinations. Electronically Signed   By: Trudie Reed M.D.   On: 08/02/2022 13:07   DG Chest  Portable 1 View  Result Date: 08/02/2022 CLINICAL DATA:  Hypertensive crisis.  Headache and dizziness. EXAM: PORTABLE CHEST 1 VIEW COMPARISON:  04/18/2016 FINDINGS: The heart size and mediastinal contours are within normal limits. Aortic atherosclerotic calcification incidentally noted. Both lungs are clear. The visualized skeletal structures are unremarkable. IMPRESSION: No active disease. Electronically Signed   By: Danae Orleans M.D.   On: 08/02/2022 12:06     Assessment: 82 y.o. female with a PMHx of CAD, PRES, essential tremor, HLD, HTN, orthostatic hypotension, hypothyroidism, Parkinson's disease, senile calcific aortic valve sclerosis and thrombocytosis who presented to the ED on Sunday morning with malaise, fatigue, headache and lightheadedness. Symptoms began on Friday with gradual onset of headache that continued to persist through Sunday morning. She denies having any vision loss. SBP in the ED was in the 250s. No meningismus was noted on EDP exam. She was afebrile but with elevated WBC of 13.5. Consistent with her history of thrombocytosis, her platelets were elevated at 682.   - Exam reveals no lateralized weakness. Cogwheel rigidity and tremor are consistent with her known diagnosis of Parkinson's disease.  - MRI brain:  New focal area of T2 hyperintensity in the right aspect of the splenium of the corpus callosum with probable faint peripheral restricted diffusion. No other areas of restricted diffusion. Remote but interval white matter infarcts in the left frontal and parietal lobes. No evidence of acute hemorrhage, a mass lesion, midline shift or hydrocephalus. - Labs: BUN and Cr normal. Mild lekocytosis with WBC 11.8. Thrombocytosis with platelets of 670.  - EKG: Sinus rhythm; Left anterior fascicular block; Abnormal R-wave progression, early transition - CXR: The heart size and mediastinal contours are within normal limits. Aortic atherosclerotic calcification incidentally noted. Both  lungs are clear. The visualized skeletal structures are unremarkable. - DDx: Hypertensive emergency may account for her headache and malaise. The subacute ischemic appearing lesion in her corpus callosum would be unusual  for cardioembolic or atherothrombotic stroke. A vasculitis could present with small vessel strokes in the corpus callosum and the similar appearing but older lesions in the deep white matter of her left frontal lobe may have the same etiology. Thrombocythemia can present with strokes.   Recommendations: - Repeat MRI brain with contrast (add on study) to assess for possible enhancement of the subacute right corpus callosum lesion, given its ring-like morphology (ordered).  - BP management per standard protocol. Permissive HTN not indicated due to subacute nature of the new MRI lesion which is unlikely to have a significant penumbra, as well as possible demyelinating/inflammatory etiology for the lesion.  - TTE - CTA of head and neck - Statin - Continue ASA and add Plavix - Cardiac telemetry - ESR, CRP and lupus panel - Stroke Team to follow   Electronically signed: Dr. Caryl Pina 08/02/2022, 8:23 PM

## 2022-08-02 NOTE — ED Triage Notes (Signed)
Pt had headache all day yesterday,not her norm. She checked her bp last night and was in 190's. Today feels awful,dizzy,headache.no URsymptoms

## 2022-08-02 NOTE — ED Provider Notes (Signed)
82 year old female with PMH of HTN, HLD, thyroid disease, Parkinson's, history of PR ES who presented with bad headache and hypertension.  Also complaining of dizziness.  No chest pain or shortness of breath.  Initial BP 215/94 on presentation.  CT head obtained at outside facility with no acute ICH.  There is a chronic meningioma and an old lacunar infarct.  Chest x-ray no pulmonary edema or mediastinal widening.  Labs notable for thrombocytosis 670.  Troponin 6 and reassuring no ACS. Given medications for headache as well as labetalol for blood pressure control.  Waiting on MRI to evaluate for PR ES.  4:31 PM Patient waiting on MRI.  She is requesting something to eat.  She has had some improvement in headache since getting medications.  She says this has been ongoing since Saturday.  She has a mild left ptosis which she notes is chronic.  Also noted to have a mild right facial droop of the right lower half of the face.  There is no drift of upper or lower extremities.  Sensation is grossly intact.  She is having no chest pain or shortness of breath.  She has been off all blood pressure medications because she was taken off by her PCP.  6:39 PM Spoke with Dr. Amada Jupiter for evaluation of this patient for possible developing subacute infarct noted on MRI and right splenium corpus callosum.  9:05 PM Repaged Dr. Otelia Limes for formal recommendations.  10:50 PM Dr Otelia Limes recommending MRI with contrast of brain for further workup and management.  Recommends blood pressure control.  No permissive hypertension.  Admitted to hospitalist for further management.    Mardene Sayer, MD 08/03/22 1001

## 2022-08-02 NOTE — Plan of Care (Signed)
ON CALL PHONE CONSULT  Call from Burr, MD @ [facility]   Discussion Patient with history of Parkinson's and prior history of PR ES, presenting with visual changes, questionable right facial droop, 2 weeks ago had left arm and leg numbness that has resolved.  Continues to have headache.  Has systolic blood pressures in the 250s.   Recs: Given history of posterior reversible encephalopathy syndrome, recommend MRI brain without contrast-ED to ED transfer. Blood pressure goal less than 220-once the MRI is done, if no stroke, then treat as hypertensive emergency with 20% reduction of blood pressure systolic per day. If there is a stroke, allow permissive hypertension to 220s. Please call the inpatient neurology service for formal consultation if required after MRI.    -- Milon Dikes, MD Neurologist [facility] Pager: [phone]

## 2022-08-02 NOTE — H&P (Signed)
History and Physical    Deanna Hill:[id number] DOB: Aug 16, 1940 DOA: 08/02/2022  PCP: Orpha Bur, MD   Patient coming from: Home   Chief Complaint: Headache   HPI: Deanna Hill is a 82 y.o. female with medical history significant for hypertension, hypothyroidism, Parkinson disease, and anxiety, now presenting to the emergency department with headache.  Patient reports that she developed a frontal headache on 07/31/2022 which has persisted.  She noted her systolic blood pressure to be in the 190s last night and has been feeling lightheaded today in addition to her persistent headache.  She has had some 15 to 20-minute episodes of blurred vision which she reports had been occurring for years.  Roughly 2 weeks ago, she experienced several minutes of numbness involving her left face, left arm, and left hand.  She states that all of her blood pressure medications were recently discontinued due to difficulty with balance, but notes that it has not appeared to have helped.  ED Course: Patient was initially seen at [facility] where she was severely hypertensive with normal heart rate, good oxygen saturation on room air, and no fever.  Labs were notable for WBC 11,800 and platelets 670,000.  Head CT was negative for acute findings, as was chest x-ray.  ED physician discussed the case with neurology who recommended transfer to [facility] for MRI brain.  MRI reveals new focal area of T2 hyperintensity involving the splenium of the corpus callosum.   Neurology at [facility] was once again consulted by the ED physician with these MRI results, and medical admission was recommended.  Review of Systems:  All other systems reviewed and apart from HPI, are negative.  Past Medical History:  Diagnosis Date   Anxiety    Coronary artery disease, non-occlusive 05/2013   Cath for Abn Myoview => mild CAD, 30% pCx. Otw normal Coronaries.  Normlal EF.   Essential tremor 06/19/2015    GERD (gastroesophageal reflux disease)    Hyperlipidemia    Hypertension    Hypothyroidism    IBS (irritable bowel syndrome)    Orthostatic hypotension 05/22/2016   Parkinson's disease    Senile calcific aortic valve sclerosis 04/2016   2D echo showed aortic sclerosis but no stenosis.  Normal wall motion.  GR 2 DD   Thrombocytosis 03/11/2011    Past Surgical History:  Procedure Laterality Date   ABDOMINAL HYSTERECTOMY  1979   CAROTID DOPPLERS  01/2017   normal Carotid, Sub-Clavian & Vertebral Arteries.   CATARACT EXTRACTION Bilateral 2015   Dr. Hollie Salk, LAPAROSCOPIC  2001   LEFT HEART CATHETERIZATION WITH CORONARY ANGIOGRAM N/A 06/08/2013   Procedure: LEFT HEART CATHETERIZATION WITH CORONARY ANGIOGRAM;  Surgeon: Othella Boyer, MD;  Location: [facility] CATH LAB;  Service: Cardiovascular: Dr. Donnie Aho: Mild CAD, 30%pCx.  Otherwise nonobstructive disease.  Normal LV function.   TOE SURGERY Left 1994   TONSILLECTOMY  1952   TRANSTHORACIC ECHOCARDIOGRAM  02/'18, 11/'18   a) Normal EF of 60-65%.  Normal wall motion.  GR 2 DD.  Aortic sclerosis without stenosis;; b) Mod LVH. EF 60-65%. Ao Sclerosis w/o AS. Pseudonormal - Gr 2 DD.    Social History:   reports that she has never smoked. She has never used smokeless tobacco. She reports that she does not drink alcohol and does not use drugs.  Allergies  Allergen Reactions   Iodine Shortness Of Breath and Other (See Comments)    Pt states "Deadly"   Prednisone  Facial flushing and swelling   Shellfish Allergy Anaphylaxis and Other (See Comments)    " I almost died once"    Covid-19 Mrna Vacc (Moderna) Other (See Comments)   Gabapentin Other (See Comments)   Meloxicam Other (See Comments)   Other Other (See Comments)   Topiramate Other (See Comments)   Zoloft [Sertraline] Other (See Comments)    Family History  Problem Relation Age of Onset   Skin cancer Mother    Gallstones Brother    Gallstones Brother     Pulmonary embolism Maternal Aunt      Prior to Admission medications   Medication Sig Start Date End Date Taking? Authorizing Provider  acetaminophen (TYLENOL) 325 MG tablet Take 650 mg by mouth daily as needed for mild pain. Take extra strength tylenol 2 times daily    [provider]  aspirin EC 81 MG tablet Take 1 tablet (81 mg total) by mouth daily. 11/22/17   Marykay Lex, MD  BIOTIN PO Take 1 capsule by mouth daily.    [provider]  candesartan (ATACAND) 4 MG tablet Take 4 mg by mouth daily. 04/23/22   [provider]  carbidopa-levodopa (SINEMET IR) 25-100 MG tablet Take 2 at 7am/2 at 11am/1 at 3pm 07/21/22   Tat, Octaviano Batty, DO  Cholecalciferol (VITAMIN D) 2000 UNITS CAPS Take 1 capsule by mouth daily.    [provider]  escitalopram (LEXAPRO) 5 MG tablet Take by mouth. 01/01/22   [provider]  hydroxyurea (HYDREA) 500 MG capsule TAKE ONE CAPSULE BY MOUTH THREE TIMES A WEEK. MAY take with food. 07/17/22   Serena Croissant, MD  levothyroxine (SYNTHROID, LEVOTHROID) 88 MCG tablet Take 88 mcg by mouth daily.    [provider]  losartan (COZAAR) 25 MG tablet Take 12.5 mg by mouth daily. 04/13/22   [provider]  nitroGLYCERIN (NITROSTAT) 0.4 MG SL tablet Place 1 tablet (0.4 mg total) under the tongue every 5 (five) minutes as needed for chest pain. 03/02/19   Marykay Lex, MD  Probiotic Product (ALIGN PO) Take by mouth.    [provider]    Physical Exam: Vitals:   08/02/22 1400 08/02/22 1430 08/02/22 1604 08/02/22 2009  BP: (!) 192/81 (!) 158/71  (!) 184/72  Pulse:  85  86  Resp:  17  17  Temp:   98 F (36.7 C) 98.4 F (36.9 C)  TempSrc:    Oral  SpO2:  100%  100%    Constitutional: NAD, calm  Eyes: PERTLA, ptosis on left  ENMT: Mucous membranes are moist. Posterior pharynx clear of any exudate or lesions.   Neck: supple, no masses  Respiratory: no wheezing, no crackles. No accessory muscle use.   Cardiovascular: S1 & S2 heard, regular rate and rhythm. No JVD. Abdomen: No distension, no tenderness, soft. Bowel sounds active.  Musculoskeletal: no clubbing / cyanosis. No joint deformity upper and lower extremities.   Skin: no significant rashes, lesions, ulcers. Warm, dry, well-perfused. Neurologic: Ptosis on left, CN II-XII grossly intact otherwise. Sensation intact. Strength 5/5 in all 4 limbs. Alert and oriented.  Psychiatric: Calm. Cooperative.    Labs and Imaging on Admission: I have personally reviewed following labs and imaging studies  CBC: Recent Labs  Lab 08/02/22 1205  WBC 11.8*  NEUTROABS 10.4*  HGB 13.8  HCT 44.2  MCV 97.6  PLT 670*   Basic Metabolic Panel: Recent Labs  Lab 08/02/22 1205  NA 141  K 4.0  CL  104  CO2 28  GLUCOSE 118*  BUN 21  CREATININE 0.76  CALCIUM 9.1   GFR: Estimated Creatinine Clearance: 47.1 mL/min (by C-G formula based on SCr of 0.76 mg/dL). Liver Function Tests: No results for input(s): "AST", "ALT", "ALKPHOS", "BILITOT", "PROT", "ALBUMIN" in the last 168 hours. No results for input(s): "LIPASE", "AMYLASE" in the last 168 hours. No results for input(s): "AMMONIA" in the last 168 hours. Coagulation Profile: No results for input(s): "INR", "PROTIME" in the last 168 hours. Cardiac Enzymes: No results for input(s): "CKTOTAL", "CKMB", "CKMBINDEX", "TROPONINI" in the last 168 hours. BNP (last 3 results) No results for input(s): "PROBNP" in the last 8760 hours. HbA1C: No results for input(s): "HGBA1C" in the last 72 hours. CBG: No results for input(s): "GLUCAP" in the last 168 hours. Lipid Profile: No results for input(s): "CHOL", "HDL", "LDLCALC", "TRIG", "CHOLHDL", "LDLDIRECT" in the last 72 hours. Thyroid Function Tests: No results for input(s): "TSH", "T4TOTAL", "FREET4", "T3FREE", "THYROIDAB" in the last 72 hours. Anemia Panel: No results for input(s): "VITAMINB12", "FOLATE", "FERRITIN", "TIBC", "IRON", "RETICCTPCT" in  the last 72 hours. Urine analysis:    Component Value Date/Time   COLORURINE STRAW (A) 02/04/2017 2013   APPEARANCEUR CLEAR 02/04/2017 2013   LABSPEC 1.006 02/04/2017 2013   PHURINE 6.0 02/04/2017 2013   GLUCOSEU NEGATIVE 02/04/2017 2013   HGBUR NEGATIVE 02/04/2017 2013   BILIRUBINUR NEGATIVE 02/04/2017 2013   KETONESUR NEGATIVE 02/04/2017 2013   PROTEINUR NEGATIVE 02/04/2017 2013   UROBILINOGEN 0.2 11/20/2013 0630   NITRITE NEGATIVE 02/04/2017 2013   LEUKOCYTESUR MODERATE (A) 02/04/2017 2013   Sepsis Labs: @LABRCNTIP (procalcitonin:4,lacticidven:4) )No results found for this or any previous visit (from the past 240 hour(s)).   Radiological Exams on Admission: MR BRAIN WO CONTRAST  Result Date: 08/02/2022 CLINICAL DATA:  Concern for PRES EXAM: MRI HEAD WITHOUT CONTRAST TECHNIQUE: Multiplanar, multiecho pulse sequences of the brain and surrounding structures were obtained without intravenous contrast. COMPARISON:  MRI head February 10, 2017. FINDINGS: Brain: New focal area of T2 hyperintensity in the right aspect of the splenium of the corpus callosum with probable faint peripheral restricted diffusion. No other areas of restricted diffusion. Remote but interval white matter infarcts in the left frontal and parietal lobes. No evidence of acute hemorrhage, a mass lesion, midline shift or hydrocephalus. Vascular: Major arterial flow voids are maintained at the skull base. Skull and upper cervical spine: Normal marrow signal. Sinuses/Orbits: Left maxillary sinus retention cyst. Otherwise, sinuses are largely clear. No acute orbital findings. Other: No mastoid effusions IMPRESSION: 1. New focal area of T2 hyperintensity in the right aspect of the splenium of the corpus callosum with probable faint peripheral restricted diffusion, most likely an evolving subacute infarct. 2. Remote but interval white matter infarcts in the left frontal and parietal lobes. 3. Otherwise, no evidence of acute  intracranial abnormality. Electronically Signed   By: Feliberto Harts M.D.   On: 08/02/2022 17:55   CT HEAD WO CONTRAST  Result Date: 08/02/2022 CLINICAL DATA:  82 year old female with history of sudden onset of severe headache. EXAM: CT HEAD WITHOUT CONTRAST TECHNIQUE: Contiguous axial images were obtained from the base of the skull through the vertex without intravenous contrast. RADIATION DOSE REDUCTION: This exam was performed according to the departmental dose-optimization program which includes automated exposure control, adjustment of the mA and/or kV according to patient size and/or use of iterative reconstruction technique. COMPARISON:  Head CT 04/14/2019. FINDINGS: Brain: New well-defined low-attenuation in the left frontal lobe white matter (axial image 16 of  series 2), compatible with an old lacunar infarct. Patchy and confluent areas of decreased attenuation are noted throughout the deep and periventricular white matter of the cerebral hemispheres bilaterally, compatible with chronic microvascular ischemic disease. 8 x 5 mm calcification along the posterior aspect of the falx cerebri to the right of midline (axial image 22 series 2), similar to prior studies, compatible with known meningioma. No evidence of acute infarction, hemorrhage, hydrocephalus, extra-axial collection or new mass lesion/mass effect. Vascular: No hyperdense vessel or unexpected calcification. Skull: Normal. Negative for fracture or focal lesion. Sinuses/Orbits: No acute finding. Other: None. IMPRESSION: 1. No acute intracranial abnormalities. 2. Mild chronic microvascular ischemic changes and old lacunar infarct in the left frontal lobe white matter (new compared to the prior study, but not acute). 3. Small parafalcine meningioma in the right parietal region, measuring 8 x 5 mm, similar to prior examinations. Electronically Signed   By: Trudie Reed M.D.   On: 08/02/2022 13:07   DG Chest Portable 1 View  Result Date:  08/02/2022 CLINICAL DATA:  Hypertensive crisis.  Headache and dizziness. EXAM: PORTABLE CHEST 1 VIEW COMPARISON:  04/18/2016 FINDINGS: The heart size and mediastinal contours are within normal limits. Aortic atherosclerotic calcification incidentally noted. Both lungs are clear. The visualized skeletal structures are unremarkable. IMPRESSION: No active disease. Electronically Signed   By: Danae Orleans M.D.   On: 08/02/2022 12:06    EKG: Independently reviewed. Sinus rhythm, LAFB.   Assessment/Plan   1. New brain lesion  - Presents with HA, had SBP 215, and MRI without contrast reveals new area of T2 hyperintensity involving the corpus collosum  - MRI brain with contrast has been ordered by neurology, will follow-up results of the MRI and neurology recommendations   2. Hypertensive urgency  - SBP as high as 215 in ED  - All of her antihypertensives were discontinued recently d/t balance difficulty but does not seem to have helped that per pt  - She was given 2 dose of IV labetalol in ED with improvement  - Goal SBP is 170 for tonight, then gradually lower to normal - Restart losartan with dose now, continue as-needed labetalol    3. Parkinson disease  - Continue Sinemet   4. Essential thrombocytosis  - Continue Hydrea   5. Hypothyroidism  - Continue Synthroid     DVT prophylaxis: Lovenox  Code Status: Full  Level of Care: Level of care: Telemetry Medical Family Communication: None present  Disposition Plan:  Patient is from: home  Anticipated d/c is to: Home  Anticipated d/c date is: Possibly as early as 08/03/22  Patient currently: Pending MRI brain with contrast, BP-control  Consults called: Neurology  Admission status: Observation     Briscoe Deutscher, MD [facility]  08/02/2022, 11:19 PM

## 2022-08-02 NOTE — ED Notes (Signed)
Called [facility] -- spoke to [employer]; informed pt need ED to ED tx via [facility]; Pricilla Loveless, MD accepting

## 2022-08-02 NOTE — ED Notes (Signed)
Tx to CT

## 2022-08-03 ENCOUNTER — Observation Stay (HOSPITAL_BASED_OUTPATIENT_CLINIC_OR_DEPARTMENT_OTHER): Payer: Medicare Other

## 2022-08-03 ENCOUNTER — Observation Stay (HOSPITAL_COMMUNITY): Payer: Medicare Other

## 2022-08-03 ENCOUNTER — Encounter (HOSPITAL_COMMUNITY): Payer: Self-pay | Admitting: Family Medicine

## 2022-08-03 DIAGNOSIS — I6381 Other cerebral infarction due to occlusion or stenosis of small artery: Secondary | ICD-10-CM | POA: Diagnosis not present

## 2022-08-03 DIAGNOSIS — D320 Benign neoplasm of cerebral meninges: Secondary | ICD-10-CM | POA: Diagnosis not present

## 2022-08-03 DIAGNOSIS — I6389 Other cerebral infarction: Secondary | ICD-10-CM | POA: Diagnosis not present

## 2022-08-03 DIAGNOSIS — I63512 Cerebral infarction due to unspecified occlusion or stenosis of left middle cerebral artery: Secondary | ICD-10-CM | POA: Diagnosis not present

## 2022-08-03 DIAGNOSIS — R55 Syncope and collapse: Secondary | ICD-10-CM | POA: Diagnosis not present

## 2022-08-03 DIAGNOSIS — G20A1 Parkinson's disease without dyskinesia, without mention of fluctuations: Secondary | ICD-10-CM | POA: Diagnosis not present

## 2022-08-03 DIAGNOSIS — I639 Cerebral infarction, unspecified: Secondary | ICD-10-CM | POA: Diagnosis not present

## 2022-08-03 DIAGNOSIS — I160 Hypertensive urgency: Secondary | ICD-10-CM | POA: Diagnosis not present

## 2022-08-03 DIAGNOSIS — R42 Dizziness and giddiness: Secondary | ICD-10-CM | POA: Diagnosis not present

## 2022-08-03 DIAGNOSIS — R262 Difficulty in walking, not elsewhere classified: Secondary | ICD-10-CM | POA: Diagnosis not present

## 2022-08-03 LAB — CBC
HCT: 41.6 % (ref 36.0–46.0)
Hemoglobin: 12.9 g/dL (ref 12.0–15.0)
MCH: 30.3 pg (ref 26.0–34.0)
MCHC: 31 g/dL (ref 30.0–36.0)
MCV: 97.7 fL (ref 80.0–100.0)
Platelets: 682 10*3/uL — ABNORMAL HIGH (ref 150–400)
RBC: 4.26 MIL/uL (ref 3.87–5.11)
RDW: 13.6 % (ref 11.5–15.5)
WBC: 13.5 10*3/uL — ABNORMAL HIGH (ref 4.0–10.5)
nRBC: 0 % (ref 0.0–0.2)

## 2022-08-03 LAB — LIPID PANEL
Cholesterol: 149 mg/dL (ref 0–200)
HDL: 37 mg/dL — ABNORMAL LOW (ref >40)
LDL Cholesterol: 94 mg/dL (ref 0–99)
Total CHOL/HDL Ratio: 4 RATIO
Triglycerides: 91 mg/dL (ref <150)
VLDL: 18 mg/dL (ref 0–40)

## 2022-08-03 LAB — BASIC METABOLIC PANEL
Anion gap: 10 (ref 5–15)
BUN: 16 mg/dL (ref 8–23)
CO2: 25 mmol/L (ref 22–32)
Calcium: 8.6 mg/dL — ABNORMAL LOW (ref 8.9–10.3)
Chloride: 101 mmol/L (ref 98–111)
Creatinine, Ser: 0.94 mg/dL (ref 0.44–1.00)
GFR, Estimated: >60 mL/min (ref >60)
Glucose, Bld: 107 mg/dL — ABNORMAL HIGH (ref 70–99)
Potassium: 3.6 mmol/L (ref 3.5–5.1)
Sodium: 136 mmol/L (ref 135–145)

## 2022-08-03 LAB — ECHOCARDIOGRAM COMPLETE
AR max vel: 2.43 cm2
AV Area VTI: 2.43 cm2
AV Area mean vel: 2.34 cm2
AV Mean grad: 4 mmHg
AV Peak grad: 6.7 mmHg
Ao pk vel: 1.29 m/s
Area-P 1/2: 3.77 cm2
Calc EF: 70.3 %
Height: 69 in
S' Lateral: 1.9 cm
Single Plane A2C EF: 65.4 %
Single Plane A4C EF: 74.5 %
Weight: 1940.05 oz

## 2022-08-03 LAB — URINALYSIS, ROUTINE W REFLEX MICROSCOPIC
Bilirubin Urine: NEGATIVE
Glucose, UA: NEGATIVE mg/dL
Hgb urine dipstick: NEGATIVE
Ketones, ur: 20 mg/dL — AB
Nitrite: NEGATIVE
Protein, ur: NEGATIVE mg/dL
Specific Gravity, Urine: >1.046 — ABNORMAL HIGH (ref 1.005–1.030)
pH: 5 (ref 5.0–8.0)

## 2022-08-03 LAB — RAPID URINE DRUG SCREEN, HOSP PERFORMED
Amphetamines: NOT DETECTED
Barbiturates: NOT DETECTED
Benzodiazepines: NOT DETECTED
Cocaine: NOT DETECTED
Opiates: NOT DETECTED
Tetrahydrocannabinol: NOT DETECTED

## 2022-08-03 LAB — HEMOGLOBIN A1C
Hgb A1c MFr Bld: 5.4 % (ref 4.8–5.6)
Mean Plasma Glucose: 108.28 mg/dL

## 2022-08-03 LAB — MAGNESIUM: Magnesium: 2.2 mg/dL (ref 1.7–2.4)

## 2022-08-03 MED ORDER — ROSUVASTATIN CALCIUM 5 MG PO TABS
10.0000 mg | ORAL_TABLET | Freq: Every day | ORAL | Status: DC
  Filled 2022-08-03: qty 2

## 2022-08-03 MED ORDER — LOSARTAN POTASSIUM 50 MG PO TABS
25.0000 mg | ORAL_TABLET | Freq: Every day | ORAL | Status: DC
  Administered 2022-08-03: 25 mg via ORAL

## 2022-08-03 MED ORDER — IOHEXOL 350 MG/ML SOLN
75.0000 mL | Freq: Once | INTRAVENOUS | Status: AC | PRN
  Administered 2022-08-03: 75 mL via INTRAVENOUS

## 2022-08-03 MED ORDER — ASPIRIN EC 81 MG PO TBEC: 81.0000 mg | DELAYED_RELEASE_TABLET | Freq: Every day | ORAL | 0 refills | Status: DC

## 2022-08-03 MED ORDER — ROSUVASTATIN CALCIUM 10 MG PO TABS: 10.0000 mg | ORAL_TABLET | Freq: Every day | ORAL | 1 refills | Status: DC

## 2022-08-03 MED ORDER — CLOPIDOGREL BISULFATE 75 MG PO TABS
75.0000 mg | ORAL_TABLET | Freq: Every day | ORAL | Status: DC
  Administered 2022-08-03: 75 mg via ORAL
  Filled 2022-08-03: qty 1

## 2022-08-03 MED ORDER — CLOPIDOGREL BISULFATE 75 MG PO TABS: 75.0000 mg | ORAL_TABLET | Freq: Every day | ORAL | 1 refills | Status: DC

## 2022-08-03 NOTE — ED Notes (Signed)
ED TO INPATIENT HANDOFF REPORT  ED Nurse Name and Phone #:   S Name/Age/Gender Deanna Hill 82 y.o. female Room/Bed: 004C/004C  Code Status   Code Status: Full Code  Home/SNF/Other Home Patient oriented to: self, place, time, and situation Is this baseline? Yes   Triage Complete: Triage complete  Chief Complaint Headache [R51.9]  Triage Note Pt had headache all day yesterday,not her norm. She checked her bp last night and was in 190's. Today feels awful,dizzy,headache.no URsymptoms   Allergies Allergies  Allergen Reactions   Iodine Shortness Of Breath and Other (See Comments)    Pt states "Deadly"   Prednisone     Facial flushing and swelling   Shellfish Allergy Anaphylaxis and Other (See Comments)    " I almost died once"    Covid-19 Mrna Vacc (Moderna) Other (See Comments)   Gabapentin Other (See Comments)   Meloxicam Other (See Comments)   Other Other (See Comments)   Topiramate Other (See Comments)   Zoloft [Sertraline] Other (See Comments)    Level of Care/Admitting Diagnosis ED Disposition     ED Disposition  Admit   Condition  --   Comment  Hospital Area: [facility] [[id number]]  Level of Care: Telemetry Medical [104]  May place patient in observation at [facility] or [facility] if equivalent level of care is available:: No  Covid Evaluation: Asymptomatic - no recent exposure (last 10 days) testing not required  Diagnosis: Headache [[id number]]  Admitting Physician: Briscoe Deutscher [[id number]]  Attending Physician: Briscoe Deutscher [[id number]]          B Medical/Surgery History Past Medical History:  Diagnosis Date   Anxiety    Coronary artery disease, non-occlusive 05/2013   Cath for Abn Myoview => mild CAD, 30% pCx. Otw normal Coronaries.  Normlal EF.   Essential tremor 06/19/2015   GERD (gastroesophageal reflux disease)    Hyperlipidemia    Hypertension    Hypothyroidism    IBS (irritable bowel syndrome)     Orthostatic hypotension 05/22/2016   Parkinson's disease    Senile calcific aortic valve sclerosis 04/2016   2D echo showed aortic sclerosis but no stenosis.  Normal wall motion.  GR 2 DD   Thrombocytosis 03/11/2011   Past Surgical History:  Procedure Laterality Date   ABDOMINAL HYSTERECTOMY  1979   CAROTID DOPPLERS  01/2017   normal Carotid, Sub-Clavian & Vertebral Arteries.   CATARACT EXTRACTION Bilateral 2015   Dr. Hollie Salk, LAPAROSCOPIC  2001   LEFT HEART CATHETERIZATION WITH CORONARY ANGIOGRAM N/A 06/08/2013   Procedure: LEFT HEART CATHETERIZATION WITH CORONARY ANGIOGRAM;  Surgeon: Othella Boyer, MD;  Location: [facility] CATH LAB;  Service: Cardiovascular: Dr. Donnie Aho: Mild CAD, 30%pCx.  Otherwise nonobstructive disease.  Normal LV function.   TOE SURGERY Left 1994   TONSILLECTOMY  1952   TRANSTHORACIC ECHOCARDIOGRAM  02/'18, 11/'18   a) Normal EF of 60-65%.  Normal wall motion.  GR 2 DD.  Aortic sclerosis without stenosis;; b) Mod LVH. EF 60-65%. Ao Sclerosis w/o AS. Pseudonormal - Gr 2 DD.     A IV Location/Drains/Wounds Patient Lines/Drains/Airways Status     Active Line/Drains/Airways     Name Placement date Placement time Site Days   Peripheral IV 08/02/22 Anterior;Left;Proximal Forearm 08/02/22  1200  Forearm  1            Intake/Output Last 24 hours No intake or output data in the 24 hours ending 08/03/22 1136  Labs/Imaging Results  for orders placed or performed during the hospital encounter of 08/02/22 (from the past 48 hour(s))  Basic metabolic panel     Status: Abnormal   Collection Time: 08/02/22 12:05 PM  Result Value Ref Range   Sodium 141 135 - 145 mmol/L   Potassium 4.0 3.5 - 5.1 mmol/L   Chloride 104 98 - 111 mmol/L   CO2 28 22 - 32 mmol/L   Glucose, Bld 118 (H) 70 - 99 mg/dL    Comment: Glucose reference range applies only to samples taken after fasting for at least 8 hours.   BUN 21 8 - 23 mg/dL   Creatinine, Ser 4.78 0.44 - 1.00  mg/dL   Calcium 9.1 8.9 - 29.5 mg/dL   GFR, Estimated >62 >13 mL/min    Comment: (NOTE) Calculated using the CKD-EPI Creatinine Equation (2021)    Anion gap 9 5 - 15    Comment: Performed at [facility], [address], [place], [place] [address]  CBC with Differential/Platelet     Status: Abnormal   Collection Time: 08/02/22 12:05 PM  Result Value Ref Range   WBC 11.8 (H) 4.0 - 10.5 K/uL   RBC 4.53 3.87 - 5.11 MIL/uL   Hemoglobin 13.8 12.0 - 15.0 g/dL   HCT 84.6 96.2 - 95.2 %   MCV 97.6 80.0 - 100.0 fL   MCH 30.5 26.0 - 34.0 pg   MCHC 31.2 30.0 - 36.0 g/dL   RDW 84.1 32.4 - 40.1 %   Platelets 670 (H) 150 - 400 K/uL   nRBC 0.0 0.0 - 0.2 %   Neutrophils Relative % 89 %   Neutro Abs 10.4 (H) 1.7 - 7.7 K/uL   Lymphocytes Relative 4 %   Lymphs Abs 0.5 (L) 0.7 - 4.0 K/uL   Monocytes Relative 4 %   Monocytes Absolute 0.5 0.1 - 1.0 K/uL   Eosinophils Relative 2 %   Eosinophils Absolute 0.3 0.0 - 0.5 K/uL   Basophils Relative 1 %   Basophils Absolute 0.1 0.0 - 0.1 K/uL   Immature Granulocytes 0 %   Abs Immature Granulocytes 0.05 0.00 - 0.07 K/uL    Comment: Performed at [facility], [address] [place], [place] [address]  Troponin I (High Sensitivity)     Status: None   Collection Time: 08/02/22 12:05 PM  Result Value Ref Range   Troponin I (High Sensitivity) 6 <18 ng/L    Comment: (NOTE) Elevated high sensitivity troponin I (hsTnI) values and significant  changes across serial measurements may suggest ACS but many other  chronic and acute conditions are known to elevate hsTnI results.  Refer to the "Links" section for chest pain algorithms and additional  guidance. Performed at [facility], [address], [place], [place] [address]   Basic metabolic panel     Status: Abnormal   Collection Time: 08/03/22  4:20 AM  Result Value Ref Range   Sodium 136 135 - 145 mmol/L   Potassium 3.6 3.5 - 5.1 mmol/L    Chloride 101 98 - 111 mmol/L   CO2 25 22 - 32 mmol/L   Glucose, Bld 107 (H) 70 - 99 mg/dL    Comment: Glucose reference range applies only to samples taken after fasting for at least 8 hours.   BUN 16 8 - 23 mg/dL   Creatinine, Ser 0.34 0.44 - 1.00 mg/dL   Calcium 8.6 (L) 8.9 - 10.3 mg/dL   GFR, Estimated >74 >25 mL/min    Comment: (  NOTE) Calculated using the CKD-EPI Creatinine Equation (2021)    Anion gap 10 5 - 15    Comment: Performed at [facility], [address] [place], [place] [address]  Magnesium     Status: None   Collection Time: 08/03/22  4:20 AM  Result Value Ref Range   Magnesium 2.2 1.7 - 2.4 mg/dL    Comment: Performed at [facility], [address], [place], [place] [address]  CBC     Status: Abnormal   Collection Time: 08/03/22  4:20 AM  Result Value Ref Range   WBC 13.5 (H) 4.0 - 10.5 K/uL   RBC 4.26 3.87 - 5.11 MIL/uL   Hemoglobin 12.9 12.0 - 15.0 g/dL   HCT 11.9 14.7 - 82.9 %   MCV 97.7 80.0 - 100.0 fL   MCH 30.3 26.0 - 34.0 pg   MCHC 31.0 30.0 - 36.0 g/dL   RDW 56.2 13.0 - 86.5 %   Platelets 682 (H) 150 - 400 K/uL   nRBC 0.0 0.0 - 0.2 %    Comment: Performed at [facility], [address], [place], [place] [address]  Lipid panel     Status: Abnormal   Collection Time: 08/03/22  4:20 AM  Result Value Ref Range   Cholesterol 149 0 - 200 mg/dL   Triglycerides 91 <629 mg/dL   HDL 37 (L) >52 mg/dL   Total CHOL/HDL Ratio 4.0 RATIO   VLDL 18 0 - 40 mg/dL   LDL Cholesterol 94 0 - 99 mg/dL    Comment:        Total Cholesterol/HDL:CHD Risk Coronary Heart Disease Risk Table                     Men   Women  1/2 Average Risk   3.4   3.3  Average Risk       5.0   4.4  2 X Average Risk   9.6   7.1  3 X Average Risk  23.4   11.0        Use the calculated Patient Ratio above and the CHD Risk Table to determine the patient's CHD Risk.        ATP III CLASSIFICATION (LDL):  <100     mg/dL   Optimal  841-324  mg/dL   Near or Above                     Optimal  130-159  mg/dL   Borderline  401-027  mg/dL   High  >253     mg/dL   Very High Performed at [facility], [address] [place], [place] [address]   Hemoglobin A1c     Status: None   Collection Time: 08/03/22  4:20 AM  Result Value Ref Range   Hgb A1c MFr Bld 5.4 4.8 - 5.6 %    Comment: (NOTE) Pre diabetes:          5.7%-6.4%  Diabetes:              >6.4%  Glycemic control for   <7.0% adults with diabetes    Mean Plasma Glucose 108.28 mg/dL    Comment: Performed at [facility], [address], [place], [place] [address]   CT ANGIO HEAD NECK W WO CM  Result Date: 08/03/2022 CLINICAL DATA:  Stroke follow-up EXAM: CT ANGIOGRAPHY HEAD AND NECK WITH AND WITHOUT CONTRAST TECHNIQUE: Multidetector CT  imaging of the head and neck was performed using the standard protocol during bolus administration of intravenous contrast. Multiplanar CT image reconstructions and MIPs were obtained to evaluate the vascular anatomy. Carotid stenosis measurements (when applicable) are obtained utilizing NASCET criteria, using the distal internal carotid diameter as the denominator. RADIATION DOSE REDUCTION: This exam was performed according to the departmental dose-optimization program which includes automated exposure control, adjustment of the mA and/or kV according to patient size and/or use of iterative reconstruction technique. CONTRAST:  75mL OMNIPAQUE IOHEXOL 350 MG/ML SOLN COMPARISON:  MRA Head/Neck 04/18/16 FINDINGS: CT HEAD FINDINGS Brain: Note that the presence of IV contrast limits the ability to assess for intracranial hemorrhage. Redemonstrated is a parafalcine meningioma on the right, better assessed on recent contrast-enhanced brain MRI. No CT evidence of an acute cortical infarct. Redemonstrated is chronic infarct in the left frontal lobe. Vascular: See below Skull: Normal. Negative for fracture or focal lesion. Sinuses/Orbits: No middle ear or mastoid effusion. Polypoid  mucosal thickening left maxillary sinus. Bilateral lens replacement. Orbits are otherwise unremarkable. Other: None. Review of the MIP images confirms the above findings CTA NECK FINDINGS Aortic arch: Standard branching. Imaged portion shows no evidence of aneurysm or dissection. No significant stenosis of the major arch vessel origins. Right carotid system: No evidence of dissection, stenosis (50% or greater), or occlusion. Left carotid system: No evidence of dissection, stenosis (50% or greater), or occlusion. Vertebral arteries: There is mild somewhat irregular narrowing of the V1 segment of the left vertebral artery, which can be seen in the setting of vertebral artery dissection (see screen shot). This is new from 2018. Skeleton: Negative. Other neck: Negative. Upper chest: Negative Review of the MIP images confirms the above findings CTA HEAD FINDINGS Anterior circulation: No significant stenosis, proximal occlusion, aneurysm, or vascular malformation. There is atherosclerotic narrowing in the bilateral MCA territories, with the area of greatest narrowing being the superior terminal branch of the left MCA (see screenshot), likely unchanged from 2018. Posterior circulation: No significant stenosis, proximal occlusion, aneurysm, or vascular malformation. Venous sinuses: Patent when accounting for timing of the contrast bolus. Anatomic variants: None Review of the MIP images confirms the above findings IMPRESSION: 1. Mild irregular narrowing of the V1 segment of the left vertebral artery could be seen in the setting of dissection. 2. No intracranial large vessel occlusion. 3. No hemodynamically significant stenosis in the neck. 4. Multifocal atherosclerotic intracranial stenoses, greatest in the left MCA distribution, as above. Electronically Signed   By: Lorenza Cambridge M.D.   On: 08/03/2022 09:41   MR BRAIN W CONTRAST  Result Date: 08/03/2022 CLINICAL DATA:  Follow-up examination for possible demyelinating  lesion. EXAM: MRI HEAD WITH CONTRAST TECHNIQUE: Multiplanar, multiecho pulse sequences of the brain and surrounding structures were obtained with intravenous contrast. CONTRAST:  5mL GADAVIST GADOBUTROL 1 MMOL/ML IV SOLN COMPARISON:  Prior MRI from 08/02/2022. FINDINGS: Brain: Postcontrast imaging of the brain demonstrates no appreciable enhancement about the cerebral white matter lesions to suggest active demyelination. Incidental DVA noted at the right parietal lobe. 11 mm right parafalcine meningioma present at the right parietal lobe without mass effect. No other abnormal or pathologic enhancement. Vascular: Normal intravascular enhancement seen throughout the major intracranial vasculature. Skull and upper cervical spine: Craniocervical junction within normal limits. No focal marrow replacing lesion. No scalp soft tissue abnormality. Sinuses/Orbits: Globes and orbital soft tissues demonstrate no acute finding. Prior bilateral ocular lens replacement. Paranasal sinuses are largely clear. No significant mastoid effusion. Other: None. IMPRESSION: 1.  No abnormal enhancement to suggest active demyelination. 2. 11 mm right parafalcine meningioma without mass effect. 3. Incidental DVA at the right parietal lobe. Electronically Signed   By: Rise Mu M.D.   On: 08/03/2022 00:46   MR BRAIN WO CONTRAST  Result Date: 08/02/2022 CLINICAL DATA:  Concern for PRES EXAM: MRI HEAD WITHOUT CONTRAST TECHNIQUE: Multiplanar, multiecho pulse sequences of the brain and surrounding structures were obtained without intravenous contrast. COMPARISON:  MRI head February 10, 2017. FINDINGS: Brain: New focal area of T2 hyperintensity in the right aspect of the splenium of the corpus callosum with probable faint peripheral restricted diffusion. No other areas of restricted diffusion. Remote but interval white matter infarcts in the left frontal and parietal lobes. No evidence of acute hemorrhage, a mass lesion, midline shift  or hydrocephalus. Vascular: Major arterial flow voids are maintained at the skull base. Skull and upper cervical spine: Normal marrow signal. Sinuses/Orbits: Left maxillary sinus retention cyst. Otherwise, sinuses are largely clear. No acute orbital findings. Other: No mastoid effusions IMPRESSION: 1. New focal area of T2 hyperintensity in the right aspect of the splenium of the corpus callosum with probable faint peripheral restricted diffusion, most likely an evolving subacute infarct. 2. Remote but interval white matter infarcts in the left frontal and parietal lobes. 3. Otherwise, no evidence of acute intracranial abnormality. Electronically Signed   By: Feliberto Harts M.D.   On: 08/02/2022 17:55   CT HEAD WO CONTRAST  Result Date: 08/02/2022 CLINICAL DATA:  82 year old female with history of sudden onset of severe headache. EXAM: CT HEAD WITHOUT CONTRAST TECHNIQUE: Contiguous axial images were obtained from the base of the skull through the vertex without intravenous contrast. RADIATION DOSE REDUCTION: This exam was performed according to the departmental dose-optimization program which includes automated exposure control, adjustment of the mA and/or kV according to patient size and/or use of iterative reconstruction technique. COMPARISON:  Head CT 04/14/2019. FINDINGS: Brain: New well-defined low-attenuation in the left frontal lobe white matter (axial image 16 of series 2), compatible with an old lacunar infarct. Patchy and confluent areas of decreased attenuation are noted throughout the deep and periventricular white matter of the cerebral hemispheres bilaterally, compatible with chronic microvascular ischemic disease. 8 x 5 mm calcification along the posterior aspect of the falx cerebri to the right of midline (axial image 22 series 2), similar to prior studies, compatible with known meningioma. No evidence of acute infarction, hemorrhage, hydrocephalus, extra-axial collection or new mass  lesion/mass effect. Vascular: No hyperdense vessel or unexpected calcification. Skull: Normal. Negative for fracture or focal lesion. Sinuses/Orbits: No acute finding. Other: None. IMPRESSION: 1. No acute intracranial abnormalities. 2. Mild chronic microvascular ischemic changes and old lacunar infarct in the left frontal lobe white matter (new compared to the prior study, but not acute). 3. Small parafalcine meningioma in the right parietal region, measuring 8 x 5 mm, similar to prior examinations. Electronically Signed   By: Trudie Reed M.D.   On: 08/02/2022 13:07   DG Chest Portable 1 View  Result Date: 08/02/2022 CLINICAL DATA:  Hypertensive crisis.  Headache and dizziness. EXAM: PORTABLE CHEST 1 VIEW COMPARISON:  04/18/2016 FINDINGS: The heart size and mediastinal contours are within normal limits. Aortic atherosclerotic calcification incidentally noted. Both lungs are clear. The visualized skeletal structures are unremarkable. IMPRESSION: No active disease. Electronically Signed   By: Danae Orleans M.D.   On: 08/02/2022 12:06    Pending Labs Unresulted Labs (From admission, onward)     Start  Ordered   08/09/22 0500  Creatinine, serum  (enoxaparin (LOVENOX)    CrCl >/= 30 ml/min)  Weekly,   R     Comments: while on enoxaparin therapy    08/02/22 2257   08/03/22 0846  Urinalysis, Routine w reflex microscopic -Urine, Clean Catch  Once,   R       Question:  Specimen Source  Answer:  Urine, Clean Catch   08/03/22 0847   08/03/22 0846  Rapid urine drug screen (hospital performed)  Once,   R        08/03/22 0847            Vitals/Pain Today's Vitals   08/03/22 0128 08/03/22 0345 08/03/22 0604 08/03/22 0952  BP:   (!) 191/79 (!) 168/84  Pulse:   86 93  Resp:   17 18  Temp: 98.2 F (36.8 C)  98.3 F (36.8 C) 97.8 F (36.6 C)  TempSrc: Oral  Oral Oral  SpO2:   99% 100%  PainSc:  5   2     Isolation Precautions No active isolations  Medications Medications  LORazepam  (ATIVAN) tablet 0.5 mg (has no administration in time range)  labetalol (NORMODYNE) injection 10 mg (has no administration in time range)  aspirin EC tablet 81 mg (81 mg Oral Given 08/03/22 0955)  hydroxyurea (HYDREA) capsule 500 mg (500 mg Oral Given 08/03/22 0955)  levothyroxine (SYNTHROID) tablet 88 mcg (88 mcg Oral Given 08/03/22 0610)  carbidopa-levodopa (SINEMET IR) 25-100 MG per tablet immediate release 2 tablet (2 tablets Oral Given 08/03/22 1122)  enoxaparin (LOVENOX) injection 40 mg (40 mg Subcutaneous Given 08/03/22 0111)  sodium chloride flush (NS) 0.9 % injection 3 mL (3 mLs Intravenous Given 08/03/22 1004)  acetaminophen (TYLENOL) tablet 650 mg (has no administration in time range)    Or  acetaminophen (TYLENOL) suppository 650 mg (has no administration in time range)  HYDROcodone-acetaminophen (NORCO/VICODIN) 5-325 MG per tablet 1-2 tablet (has no administration in time range)  senna-docusate (Senokot-S) tablet 1 tablet (has no administration in time range)  bisacodyl (DULCOLAX) EC tablet 5 mg (has no administration in time range)  ondansetron (ZOFRAN) tablet 4 mg (has no administration in time range)    Or  ondansetron (ZOFRAN) injection 4 mg (has no administration in time range)  losartan (COZAAR) tablet 25 mg (25 mg Oral Given 08/03/22 0955)  clopidogrel (PLAVIX) tablet 75 mg (75 mg Oral Given 08/03/22 1049)  metoCLOPramide (REGLAN) injection 10 mg (10 mg Intravenous Given 08/02/22 1220)  diphenhydrAMINE (BENADRYL) injection 12.5 mg (12.5 mg Intravenous Given 08/02/22 1212)  magnesium sulfate IVPB 2 g 50 mL (0 g Intravenous Stopped 08/02/22 1637)  prochlorperazine (COMPAZINE) injection 10 mg (10 mg Intravenous Given 08/02/22 1409)  acetaminophen (TYLENOL) tablet 1,000 mg (1,000 mg Oral Not Given 08/03/22 0344)  labetalol (NORMODYNE) injection 20 mg (20 mg Intravenous Given 08/02/22 1409)  carbidopa-levodopa (SINEMET IR) 25-100 MG per tablet immediate release 1 tablet (1 tablet Oral Given  08/02/22 1603)  labetalol (NORMODYNE) injection 10 mg (10 mg Intravenous Given 08/03/22 0110)  gadobutrol (GADAVIST) 1 MMOL/ML injection 5 mL (5 mLs Intravenous Contrast Given 08/03/22 0003)  iohexol (OMNIPAQUE) 350 MG/ML injection 75 mL (75 mLs Intravenous Contrast Given 08/03/22 0906)    Mobility walks     Focused Assessments Cardiac Assessment Handoff:  Cardiac Rhythm: Normal sinus rhythm Lab Results  Component Value Date   TROPONINI <0.03 02/05/2017   Lab Results  Component Value Date   DDIMER <0.27 02/05/2017   Does  the Patient currently have chest pain? No    R Recommendations: See Admitting Provider Note  Report given to:   Additional Notes:

## 2022-08-03 NOTE — Progress Notes (Signed)
   08/03/22 1600  Spiritual Encounters  Type of Visit Initial  Care provided to: Patient;Family  Referral source Patient request  Reason for visit Advance directives  OnCall Visit No   Ch responded to request for AD. Pt's daughter was at bedside. Ch assisted pt with AD education. Pt will page Ch when AD is completed. No follow-up needed at this time.

## 2022-08-03 NOTE — Evaluation (Addendum)
Physical Therapy Evaluation Patient Details Name: Deanna Hill MRN: [id number] DOB: 10-15-1940 Today's Date: 08/03/2022  History of Present Illness  82 y.o. female presents to [facility] hospital on 08/02/2022 with HA and hypertension. MRI reveals R parafalcine meningioma, also present in 2018. PMH: Anxiety, GERD, CAD, Essential Temor, HTN, HLD, Orthostatic Hypotension, Parkinson's disease.  Clinical Impression  Pt presents to PT with deficits in functional mobility, gait, balance, endurance, power. Pt has a history of Parkinson's disease which affects her gait and balance. Pt reports ambulating with use of hurry-cane at home, PRN use of rollator on her bad days. Pt is able to ambulate with use of cane at this time, tolerating dynamic gait challenges without significant balance deviation. Pt reported 2 recent falls to OT earlier in the morning. PT recommends HHPT to follow-up post-acute and provide further gait and balance training.       Recommendations for follow up therapy are one component of a multi-disciplinary discharge planning process, led by the attending physician.  Recommendations may be updated based on patient status, additional functional criteria and insurance authorization.  Follow Up Recommendations       Assistance Recommended at Discharge  (pt may elect to decline, does not seem agreeable to PT followup at the time of PT eval)  Patient can return home with the following  A little help with bathing/dressing/bathroom;Assistance with cooking/housework;Assist for transportation;Help with stairs or ramp for entrance    Equipment Recommendations Rollator (4 wheeled walker); 3in1 Bedside Commode  Recommendations for Other Services       Functional Status Assessment Patient has had a recent decline in their functional status and demonstrates the ability to make significant improvements in function in a reasonable and predictable amount of time.     Precautions / Restrictions  Precautions Precautions: Fall Precaution Comments: Parkinson's disease Restrictions Weight Bearing Restrictions: No      Mobility  Bed Mobility Overal bed mobility: Modified Independent Bed Mobility: Supine to Sit     Supine to sit: Modified independent (Device/Increase time)     General bed mobility comments: increased time    Transfers Overall transfer level: Needs assistance Equipment used: None Transfers: Sit to/from Stand Sit to Stand: Supervision                Ambulation/Gait Ambulation/Gait assistance: Supervision Gait Distance (Feet): 200 Feet Assistive device: Straight cane Gait Pattern/deviations: Step-through pattern Gait velocity: reduced Gait velocity interpretation: <1.8 ft/sec, indicate of risk for recurrent falls   General Gait Details: slowed step-through gait, reduced stride length and foot clearance bilaterally. Pt reports mild dizziness with head turns (states this occurs at baseline), tolerates backward walking and changes in stride legth well  Stairs            Wheelchair Mobility    Modified Rankin (Stroke Patients Only)       Balance Overall balance assessment: Needs assistance Sitting-balance support: No upper extremity supported, Feet supported Sitting balance-Leahy Scale: Good     Standing balance support: Single extremity supported, Reliant on assistive device for balance Standing balance-Leahy Scale: Poor                               Pertinent Vitals/Pain Pain Assessment Pain Assessment: No/denies pain    Home Living Family/patient expects to be discharged to:: Private residence Living Arrangements: Alone Available Help at Discharge: Family;Available PRN/intermittently;Neighbor Type of Home: House Home Access: Stairs to enter [employer]:  (deck  railing) Entrance Stairs-Number of Steps: 3   Home Layout: One level Home Equipment: Agricultural consultant (2 wheels);Shower seat;Grab bars -  toilet;Cane - quad (hurry-cane)      Prior Function Prior Level of Function : Independent/Modified Independent;Driving;History of Falls (last six months)             Mobility Comments: quad cane for outside mobility, no AD in the home. Reports 2 recent falls ADLs Comments: indep, drives     Hand Dominance   Dominant Hand: Right    Extremity/Trunk Assessment   Upper Extremity Assessment Upper Extremity Assessment: Defer to OT evaluation    Lower Extremity Assessment Lower Extremity Assessment: Generalized weakness    Cervical / Trunk Assessment Cervical / Trunk Assessment: Kyphotic  Communication   Communication: No difficulties  Cognition Arousal/Alertness: Awake/alert Behavior During Therapy: WFL for tasks assessed/performed Overall Cognitive Status: Within Functional Limits for tasks assessed                                          General Comments General comments (skin integrity, edema, etc.): hypertensive, 169/70 pre-mobility    Exercises     Assessment/Plan    PT Assessment Patient needs continued PT services  PT Problem List Decreased strength;Decreased activity tolerance;Decreased balance;Decreased mobility       PT Treatment Interventions DME instruction;Gait training;Stair training;Functional mobility training;Therapeutic activities;Therapeutic exercise;Balance training;Neuromuscular re-education;Patient/family education    PT Goals (Current goals can be found in the Care Plan section)  Acute Rehab PT Goals Patient Stated Goal: to go home PT Goal Formulation: With patient Time For Goal Achievement: 08/17/22 Potential to Achieve Goals: Fair Additional Goals Additional Goal #1: Pt will score >19/24 on the DGI to indicate a reduced risk for falls Additional Goal #2: Pt will score >45/56 on the BERG balance test to indicate a reduced risk for falls    Frequency Min 3X/week     Co-evaluation               AM-PAC PT "6  Clicks" Mobility  Outcome Measure Help needed turning from your back to your side while in a flat bed without using bedrails?: None Help needed moving from lying on your back to sitting on the side of a flat bed without using bedrails?: None Help needed moving to and from a bed to a chair (including a wheelchair)?: A Little Help needed standing up from a chair using your arms (e.g., wheelchair or bedside chair)?: A Little Help needed to walk in hospital room?: A Little Help needed climbing 3-5 steps with a railing? : A Little 6 Click Score: 20    End of Session   Activity Tolerance: Patient tolerated treatment well Patient left: in bed;with call bell/phone within reach;with family/visitor present Nurse Communication: Mobility status PT Visit Diagnosis: Other abnormalities of gait and mobility (R26.89);Muscle weakness (generalized) (M62.81);Other symptoms and signs involving the nervous system (R29.898)    Time: 6578-4696 PT Time Calculation (min) (ACUTE ONLY): 18 min   Charges:   PT Evaluation $PT Eval Low Complexity: 1 Low          Arlyss Gandy, PT, DPT Acute Rehabilitation Office [phone]   Arlyss Gandy 08/03/2022, 10:36 AM

## 2022-08-03 NOTE — Progress Notes (Addendum)
    Durable Medical Equipment  (From admission, onward)           Start     Ordered   08/03/22 1611  For home use only DME 3 n 1  Once        08/03/22 1610   08/03/22 1610  For home use only DME 4 wheeled rolling walker with seat  Once       Question:  Patient needs a walker to treat with the following condition  Answer:  Parkinson disease   08/03/22 1610             Patient may be confined at home to one room - so 3:1 is needed to provide for safety for toilet ing.

## 2022-08-03 NOTE — ED Notes (Signed)
Pt taken to MRI via tech

## 2022-08-03 NOTE — Discharge Summary (Addendum)
Deanna Hill:[id number] DOB: 12/15/1940 DOA: 08/02/2022  PCP: Orpha Bur, MD  Admit date: 08/02/2022 Discharge date: 08/03/2022  Time spent: 40 minutes  Recommendations for Outpatient Follow-up:  Close neurology f/u  Close PCP f/u for blood pressure    Discharge Diagnoses:  Principal Problem:   CVA (cerebral vascular accident) [facility]) Active Problems:   Essential hypertension   Essential thrombocytosis (HCC)   Hypothyroidism, acquired   Parkinson's disease   Headache   Hypertensive urgency   Discharge Condition: stable  Diet recommendation: heart healthy  Filed Weights   08/03/22 1342  Weight: 55 kg    History of present illness:  From admission h and p Deanna Hill is a 82 y.o. female with medical history significant for hypertension, hypothyroidism, Parkinson disease, and anxiety, now presenting to the emergency department with headache.   Patient reports that she developed a frontal headache on 07/31/2022 which has persisted.  She noted her systolic blood pressure to be in the 190s last night and has been feeling lightheaded today in addition to her persistent headache.  She has had some 15 to 20-minute episodes of blurred vision which she reports had been occurring for years.  Roughly 2 weeks ago, she experienced several minutes of numbness involving her left face, left arm, and left hand.  She states that all of her blood pressure medications were recently discontinued due to difficulty with balance, but notes that it has not appeared to have helped.  Hospital Course:  Patient with history parkinson's and PRES presents with headache and left-sided weakness. Found to have markedly elevated BP (says recently stopped her bp meds, has a history orthostasis and labile BPs). MRI showed subacute right corpus callosum CVA and remote left subcortical lacunar infarcts. Etiology thought to be small vessel disease. CTA/TTE nothing acute thought CTA does show possible v1  dissection, per Dr. Pearlean Brownie this is likely chronic and does not change management. Neurology advises dapt for 3 wks followed by plavix monotherapy (patient has an intolerance to aspirin) and addition of rosuvastatin (ldl 94). Patient and family aware needs outpatient neurology f/u. PT evaluated and advises HH PT which we have ordered. A bedside commode and rolling walker have also been provided. Her prior losartan was resumed and her bp improved, advise PCP f/u for further mgmt of hypertension.  Procedures: none   Consultations: neurology  Discharge Exam: Vitals:   08/03/22 0952 08/03/22 1239  BP: (!) 168/84 (!) 165/78  Pulse: 93 88  Resp: 18 18  Temp: 97.8 F (36.6 C) (!) 97.5 F (36.4 C)  SpO2: 100% 98%    General: NAD Cardiovascular: RRR Respiratory: CTAB  Discharge Instructions   Discharge Instructions     Diet - low sodium heart healthy   Complete by: As directed    Increase activity slowly   Complete by: As directed       Allergies as of 08/03/2022       Reactions   Iodine Shortness Of Breath, Other (See Comments)   Pt states "Deadly"   Prednisone    Facial flushing and swelling   Shellfish Allergy Anaphylaxis, Other (See Comments)   " I almost died once"   Covid-19 Mrna Vacc (moderna) Other (See Comments)   Gabapentin Other (See Comments)   Meloxicam Other (See Comments)   Other Other (See Comments)   Topiramate Other (See Comments)   Zoloft [sertraline] Other (See Comments)        Medication List     STOP taking these  medications    candesartan 4 MG tablet Commonly known as: ATACAND       TAKE these medications    acetaminophen 325 MG tablet Commonly known as: TYLENOL Take 650 mg by mouth daily as needed for mild pain. Take extra strength tylenol 2 times daily   aspirin EC 81 MG tablet Take 1 tablet (81 mg total) by mouth daily for 21 days.   BIOTIN PO Take 1 capsule by mouth daily.   carbidopa-levodopa 25-100 MG tablet Commonly  known as: SINEMET IR Take 2 at 7am/2 at 11am/1 at 3pm What changed:  how much to take how to take this when to take this additional instructions   clopidogrel 75 MG tablet Commonly known as: PLAVIX Take 1 tablet (75 mg total) by mouth daily. Start taking on: Aug 04, 2022   hydrALAZINE 25 MG tablet Commonly known as: APRESOLINE Take 25 mg by mouth daily.   hydroxyurea 500 MG capsule Commonly known as: HYDREA TAKE ONE CAPSULE BY MOUTH THREE TIMES A WEEK. MAY take with food. What changed: See the new instructions.   levothyroxine 88 MCG tablet Commonly known as: SYNTHROID Take 88 mcg by mouth daily.   losartan 25 MG tablet Commonly known as: COZAAR Take 12.5 mg by mouth daily.   nitroGLYCERIN 0.4 MG SL tablet Commonly known as: NITROSTAT Place 1 tablet (0.4 mg total) under the tongue every 5 (five) minutes as needed for chest pain.   PROBIOTIC PO Take 1 tablet by mouth daily.   rosuvastatin 10 MG tablet Commonly known as: CRESTOR Take 1 tablet (10 mg total) by mouth daily. Start taking on: Aug 04, 2022   Vitamin D 50 MCG (2000 UT) Caps Take 1 capsule by mouth daily.               Durable Medical Equipment  (From admission, onward)           Start     Ordered   08/03/22 1621  For home use only DME Bedside commode  Once       Comments: Patient may be confined to her bedroom for safety purposes relating to her generalized weakness  Question:  Patient needs a bedside commode to treat with the following condition  Answer:  Parkinson disease   08/03/22 1621   08/03/22 1611  For home use only DME 3 n 1  Once        08/03/22 1610   08/03/22 1610  For home use only DME 4 wheeled rolling walker with seat  Once       Question:  Patient needs a walker to treat with the following condition  Answer:  Parkinson disease   08/03/22 1610           Allergies  Allergen Reactions   Iodine Shortness Of Breath and Other (See Comments)    Pt states "Deadly"    Prednisone     Facial flushing and swelling   Shellfish Allergy Anaphylaxis and Other (See Comments)    " I almost died once"    Covid-19 Mrna Vacc (Moderna) Other (See Comments)   Gabapentin Other (See Comments)   Meloxicam Other (See Comments)   Other Other (See Comments)   Topiramate Other (See Comments)   Zoloft [Sertraline] Other (See Comments)    Follow-up Information     [place] Follow up.   Why: [facility] will provide home health services.  They will call you in the next 24-48 hours  to start services. Contact information: [address] [place] [address] [phone]         [facility] Follow up.   Why: [facility] will provide a Rolator and 3:1 to your hospital room before you are discharged tto home Contact information: [address] [facility] [place] [address] [phone]         Tat, Octaviano Batty, DO Follow up.   Specialty: Neurology Contact information: [address] [place]  [address] [place] [address] [phone]         Orpha Bur, MD Follow up.   Specialty: Family Medicine Contact information: [address] [place] [address] [phone]                  The results of significant diagnostics from this hospitalization (including imaging, microbiology, ancillary and laboratory) are listed below for reference.    Significant Diagnostic Studies: ECHOCARDIOGRAM COMPLETE  Result Date: 08/03/2022    ECHOCARDIOGRAM REPORT   Patient Name:   OMAYA WINBURN [facility] Date of Exam: 08/03/2022 Medical Rec #:  [id number]         Height:       69.0 in Accession #:    [id number]        Weight:       123.4 lb Date of Birth:  October 06, 1940         BSA:          1.683 m Patient Age:    82 years          BP:           168/84 mmHg Patient Gender: F                 HR:           87 bpm. Exam Location:  Inpatient Procedure: 2D Echo, Cardiac Doppler and Color Doppler Indications:    Stroke  History:         Patient has prior history of Echocardiogram examinations, most                 recent 02/05/2017. Stroke, Signs/Symptoms:Syncope; Risk                 Factors:Dyslipidemia and Hypertension. Thrombocytosis,                 Hypothyroidism.  Sonographer:    Milbert Coulter Referring Phys: [id number] PRAMOD S SETHI IMPRESSIONS  1. Left ventricular ejection fraction, by estimation, is 65 to 70%. The left ventricle has normal function. The left ventricle has no regional wall motion abnormalities. There is moderate concentric left ventricular hypertrophy. Left ventricular diastolic parameters are indeterminate.  2. Right ventricular systolic function is normal. The right ventricular size is normal.  3. The mitral valve is degenerative. Trivial mitral valve regurgitation. No evidence of mitral stenosis. Moderate mitral annular calcification.  4. The aortic valve is tricuspid. There is mild calcification of the aortic valve. There is mild thickening of the aortic valve. Aortic valve regurgitation is not visualized. Aortic valve sclerosis is present, with no evidence of aortic valve stenosis.  5. The inferior vena cava is normal in size with <50% respiratory variability, suggesting right atrial pressure of 8 mmHg. Comparison(s): No significant change from prior study. Conclusion(s)/Recommendation(s): Normal biventricular function without evidence of hemodynamically significant valvular heart disease. No intracardiac source of embolism detected on this transthoracic study. Consider a transesophageal echocardiogram to exclude cardiac source of embolism if clinically indicated. FINDINGS  Left  Ventricle: Left ventricular ejection fraction, by estimation, is 65 to 70%. The left ventricle has normal function. The left ventricle has no regional wall motion abnormalities. The left ventricular internal cavity size was normal in size. There is  moderate concentric left ventricular hypertrophy. Left ventricular diastolic parameters are  indeterminate. Right Ventricle: The right ventricular size is normal. No increase in right ventricular wall thickness. Right ventricular systolic function is normal. Left Atrium: Left atrial size was normal in size. Right Atrium: Right atrial size was normal in size. Pericardium: There is no evidence of pericardial effusion. Mitral Valve: The mitral valve is degenerative in appearance. There is mild thickening of the mitral valve leaflet(s). There is mild calcification of the mitral valve leaflet(s). Moderate mitral annular calcification. Trivial mitral valve regurgitation. No evidence of mitral valve stenosis. Tricuspid Valve: The tricuspid valve is normal in structure. Tricuspid valve regurgitation is trivial. No evidence of tricuspid stenosis. Aortic Valve: The aortic valve is tricuspid. There is mild calcification of the aortic valve. There is mild thickening of the aortic valve. Aortic valve regurgitation is not visualized. Aortic valve sclerosis is present, with no evidence of aortic valve stenosis. Aortic valve mean gradient measures 4.0 mmHg. Aortic valve peak gradient measures 6.7 mmHg. Aortic valve area, by VTI measures 2.43 cm. Pulmonic Valve: The pulmonic valve was not well visualized. Pulmonic valve regurgitation is trivial. No evidence of pulmonic stenosis. Aorta: The aortic root, ascending aorta, aortic arch and descending aorta are all structurally normal, with no evidence of dilitation or obstruction. Venous: The inferior vena cava is normal in size with less than 50% respiratory variability, suggesting right atrial pressure of 8 mmHg. IAS/Shunts: The atrial septum is grossly normal.  LEFT VENTRICLE PLAX 2D LVIDd:         3.70 cm     Diastology LVIDs:         1.90 cm     LV e' medial:    6.09 cm/s LV PW:         1.20 cm     LV E/e' medial:  15.6 LV IVS:        1.40 cm     LV e' lateral:   6.31 cm/s LVOT diam:     2.00 cm     LV E/e' lateral: 15.0 LV SV:         62 LV SV Index:   37 LVOT Area:      3.14 cm  LV Volumes (MOD) LV vol d, MOD A2C: 35.6 ml LV vol d, MOD A4C: 49.8 ml LV vol s, MOD A2C: 12.3 ml LV vol s, MOD A4C: 12.7 ml LV SV MOD A2C:     23.3 ml LV SV MOD A4C:     49.8 ml LV SV MOD BP:      30.4 ml RIGHT VENTRICLE RV Basal diam:  2.70 cm RV Mid diam:    2.20 cm RV S prime:     18.70 cm/s TAPSE (M-mode): 2.3 cm LEFT ATRIUM             Index        RIGHT ATRIUM           Index LA diam:        4.00 cm 2.38 cm/m   RA Area:     12.20 cm LA Vol (A2C):   35.9 ml 21.33 ml/m  RA Volume:   26.60 ml  15.81 ml/m LA Vol (A4C):   47.7 ml 28.35 ml/m LA Biplane  Vol: 42.9 ml 25.49 ml/m  AORTIC VALVE AV Area (Vmax):    2.43 cm AV Area (Vmean):   2.34 cm AV Area (VTI):     2.43 cm AV Vmax:           129.00 cm/s AV Vmean:          87.100 cm/s AV VTI:            0.253 m AV Peak Grad:      6.7 mmHg AV Mean Grad:      4.0 mmHg LVOT Vmax:         99.60 cm/s LVOT Vmean:        65.000 cm/s LVOT VTI:          0.196 m LVOT/AV VTI ratio: 0.77  AORTA Ao Root diam: 3.40 cm Ao Asc diam:  3.10 cm MITRAL VALVE MV Area (PHT): 3.77 cm     SHUNTS MV Decel Time: 201 msec     Systemic VTI:  0.20 m MV E velocity: 94.90 cm/s   Systemic Diam: 2.00 cm MV A velocity: 107.00 cm/s MV E/A ratio:  0.89 Jodelle Red MD Electronically signed by Jodelle Red MD Signature Date/Time: 08/03/2022/3:55:03 PM    Final    CT ANGIO HEAD NECK W WO CM  Result Date: 08/03/2022 CLINICAL DATA:  Stroke follow-up EXAM: CT ANGIOGRAPHY HEAD AND NECK WITH AND WITHOUT CONTRAST TECHNIQUE: Multidetector CT imaging of the head and neck was performed using the standard protocol during bolus administration of intravenous contrast. Multiplanar CT image reconstructions and MIPs were obtained to evaluate the vascular anatomy. Carotid stenosis measurements (when applicable) are obtained utilizing NASCET criteria, using the distal internal carotid diameter as the denominator. RADIATION DOSE REDUCTION: This exam was performed according to the  departmental dose-optimization program which includes automated exposure control, adjustment of the mA and/or kV according to patient size and/or use of iterative reconstruction technique. CONTRAST:  75mL OMNIPAQUE IOHEXOL 350 MG/ML SOLN COMPARISON:  MRA Head/Neck 04/18/16 FINDINGS: CT HEAD FINDINGS Brain: Note that the presence of IV contrast limits the ability to assess for intracranial hemorrhage. Redemonstrated is a parafalcine meningioma on the right, better assessed on recent contrast-enhanced brain MRI. No CT evidence of an acute cortical infarct. Redemonstrated is chronic infarct in the left frontal lobe. Vascular: See below Skull: Normal. Negative for fracture or focal lesion. Sinuses/Orbits: No middle ear or mastoid effusion. Polypoid mucosal thickening left maxillary sinus. Bilateral lens replacement. Orbits are otherwise unremarkable. Other: None. Review of the MIP images confirms the above findings CTA NECK FINDINGS Aortic arch: Standard branching. Imaged portion shows no evidence of aneurysm or dissection. No significant stenosis of the major arch vessel origins. Right carotid system: No evidence of dissection, stenosis (50% or greater), or occlusion. Left carotid system: No evidence of dissection, stenosis (50% or greater), or occlusion. Vertebral arteries: There is mild somewhat irregular narrowing of the V1 segment of the left vertebral artery, which can be seen in the setting of vertebral artery dissection (see screen shot). This is new from 2018. Skeleton: Negative. Other neck: Negative. Upper chest: Negative Review of the MIP images confirms the above findings CTA HEAD FINDINGS Anterior circulation: No significant stenosis, proximal occlusion, aneurysm, or vascular malformation. There is atherosclerotic narrowing in the bilateral MCA territories, with the area of greatest narrowing being the superior terminal branch of the left MCA (see screenshot), likely unchanged from 2018. Posterior  circulation: No significant stenosis, proximal occlusion, aneurysm, or vascular malformation. Venous sinuses: Patent when accounting  for timing of the contrast bolus. Anatomic variants: None Review of the MIP images confirms the above findings IMPRESSION: 1. Mild irregular narrowing of the V1 segment of the left vertebral artery could be seen in the setting of dissection. 2. No intracranial large vessel occlusion. 3. No hemodynamically significant stenosis in the neck. 4. Multifocal atherosclerotic intracranial stenoses, greatest in the left MCA distribution, as above. Electronically Signed   By: Lorenza Cambridge M.D.   On: 08/03/2022 09:41   MR BRAIN W CONTRAST  Result Date: 08/03/2022 CLINICAL DATA:  Follow-up examination for possible demyelinating lesion. EXAM: MRI HEAD WITH CONTRAST TECHNIQUE: Multiplanar, multiecho pulse sequences of the brain and surrounding structures were obtained with intravenous contrast. CONTRAST:  5mL GADAVIST GADOBUTROL 1 MMOL/ML IV SOLN COMPARISON:  Prior MRI from 08/02/2022. FINDINGS: Brain: Postcontrast imaging of the brain demonstrates no appreciable enhancement about the cerebral white matter lesions to suggest active demyelination. Incidental DVA noted at the right parietal lobe. 11 mm right parafalcine meningioma present at the right parietal lobe without mass effect. No other abnormal or pathologic enhancement. Vascular: Normal intravascular enhancement seen throughout the major intracranial vasculature. Skull and upper cervical spine: Craniocervical junction within normal limits. No focal marrow replacing lesion. No scalp soft tissue abnormality. Sinuses/Orbits: Globes and orbital soft tissues demonstrate no acute finding. Prior bilateral ocular lens replacement. Paranasal sinuses are largely clear. No significant mastoid effusion. Other: None. IMPRESSION: 1. No abnormal enhancement to suggest active demyelination. 2. 11 mm right parafalcine meningioma without mass effect. 3.  Incidental DVA at the right parietal lobe. Electronically Signed   By: Rise Mu M.D.   On: 08/03/2022 00:46   MR BRAIN WO CONTRAST  Result Date: 08/02/2022 CLINICAL DATA:  Concern for PRES EXAM: MRI HEAD WITHOUT CONTRAST TECHNIQUE: Multiplanar, multiecho pulse sequences of the brain and surrounding structures were obtained without intravenous contrast. COMPARISON:  MRI head February 10, 2017. FINDINGS: Brain: New focal area of T2 hyperintensity in the right aspect of the splenium of the corpus callosum with probable faint peripheral restricted diffusion. No other areas of restricted diffusion. Remote but interval white matter infarcts in the left frontal and parietal lobes. No evidence of acute hemorrhage, a mass lesion, midline shift or hydrocephalus. Vascular: Major arterial flow voids are maintained at the skull base. Skull and upper cervical spine: Normal marrow signal. Sinuses/Orbits: Left maxillary sinus retention cyst. Otherwise, sinuses are largely clear. No acute orbital findings. Other: No mastoid effusions IMPRESSION: 1. New focal area of T2 hyperintensity in the right aspect of the splenium of the corpus callosum with probable faint peripheral restricted diffusion, most likely an evolving subacute infarct. 2. Remote but interval white matter infarcts in the left frontal and parietal lobes. 3. Otherwise, no evidence of acute intracranial abnormality. Electronically Signed   By: Feliberto Harts M.D.   On: 08/02/2022 17:55   CT HEAD WO CONTRAST  Result Date: 08/02/2022 CLINICAL DATA:  82 year old female with history of sudden onset of severe headache. EXAM: CT HEAD WITHOUT CONTRAST TECHNIQUE: Contiguous axial images were obtained from the base of the skull through the vertex without intravenous contrast. RADIATION DOSE REDUCTION: This exam was performed according to the departmental dose-optimization program which includes automated exposure control, adjustment of the mA and/or kV  according to patient size and/or use of iterative reconstruction technique. COMPARISON:  Head CT 04/14/2019. FINDINGS: Brain: New well-defined low-attenuation in the left frontal lobe white matter (axial image 16 of series 2), compatible with an old lacunar infarct. Patchy and confluent  areas of decreased attenuation are noted throughout the deep and periventricular white matter of the cerebral hemispheres bilaterally, compatible with chronic microvascular ischemic disease. 8 x 5 mm calcification along the posterior aspect of the falx cerebri to the right of midline (axial image 22 series 2), similar to prior studies, compatible with known meningioma. No evidence of acute infarction, hemorrhage, hydrocephalus, extra-axial collection or new mass lesion/mass effect. Vascular: No hyperdense vessel or unexpected calcification. Skull: Normal. Negative for fracture or focal lesion. Sinuses/Orbits: No acute finding. Other: None. IMPRESSION: 1. No acute intracranial abnormalities. 2. Mild chronic microvascular ischemic changes and old lacunar infarct in the left frontal lobe white matter (new compared to the prior study, but not acute). 3. Small parafalcine meningioma in the right parietal region, measuring 8 x 5 mm, similar to prior examinations. Electronically Signed   By: Trudie Reed M.D.   On: 08/02/2022 13:07   DG Chest Portable 1 View  Result Date: 08/02/2022 CLINICAL DATA:  Hypertensive crisis.  Headache and dizziness. EXAM: PORTABLE CHEST 1 VIEW COMPARISON:  04/18/2016 FINDINGS: The heart size and mediastinal contours are within normal limits. Aortic atherosclerotic calcification incidentally noted. Both lungs are clear. The visualized skeletal structures are unremarkable. IMPRESSION: No active disease. Electronically Signed   By: Danae Orleans M.D.   On: 08/02/2022 12:06    Microbiology: No results found for this or any previous visit (from the past 240 hour(s)).   Labs: Basic Metabolic Panel: Recent  Labs  Lab 08/02/22 1205 08/03/22 0420  NA 141 136  K 4.0 3.6  CL 104 101  CO2 28 25  GLUCOSE 118* 107*  BUN 21 16  CREATININE 0.76 0.94  CALCIUM 9.1 8.6*  MG  --  2.2   Liver Function Tests: No results for input(s): "AST", "ALT", "ALKPHOS", "BILITOT", "PROT", "ALBUMIN" in the last 168 hours. No results for input(s): "LIPASE", "AMYLASE" in the last 168 hours. No results for input(s): "AMMONIA" in the last 168 hours. CBC: Recent Labs  Lab 08/02/22 1205 08/03/22 0420  WBC 11.8* 13.5*  NEUTROABS 10.4*  --   HGB 13.8 12.9  HCT 44.2 41.6  MCV 97.6 97.7  PLT 670* 682*   Cardiac Enzymes: No results for input(s): "CKTOTAL", "CKMB", "CKMBINDEX", "TROPONINI" in the last 168 hours. BNP: BNP (last 3 results) No results for input(s): "BNP" in the last 8760 hours.  ProBNP (last 3 results) No results for input(s): "PROBNP" in the last 8760 hours.  CBG: No results for input(s): "GLUCAP" in the last 168 hours.     Signed:  Silvano Bilis MD.  [facility] 08/03/2022, 4:44 PM

## 2022-08-03 NOTE — Care Management Obs Status (Cosign Needed)
MEDICARE OBSERVATION STATUS NOTIFICATION   Patient Details  Name: Deanna Hill MRN: [id number] Date of Birth: Nov 15, 1940   Medicare Observation Status Notification Given:  Yes    Janae Bridgeman, RN 08/03/2022, 3:25 PM

## 2022-08-03 NOTE — TOC Initial Note (Signed)
Transition of Care [facility]) - Initial/Assessment Note    Patient Details  Name: Deanna Hill MRN: [id number] Date of Birth: 07/17/1940  Transition of Care [facility]) CM/SW Contact:    Janae Bridgeman, RN Phone Number: 08/03/2022, 4:04 PM  Clinical Narrative:                 CM met with the patient at the bedside to discuss TOC needs.  The patient was admitted for Hypertensive crisis.  Patient provided with Medicare Observation letter at the bedside.  The patient lives alone and has 2 children - Dawn and Carollee Herter that assist as needed.  The patient has RW, small shower seat, and Hurry cane in the home and the daughter felt that 3:1 and Rolator would be helpful for ambulation.  PT was send a message to update note.  The patient did not have a preference for DME provider.    [place], CM was called to deliver a Rolator and 3:1 to the hospital room.  The patient was provided with Medicare choice regarding home health and patient was active with [facility] in 2018 and was agreeable for services.  I called [facility] and they accepted for home health.    The patient's family will provide transportation to home via car when the patient is medically stable for discharge to home.  Expected Discharge Plan: Home w Home Health Services Barriers to Discharge: Continued Medical Work up   Patient Goals and CMS Choice Patient states their goals for this hospitalization and ongoing recovery are:: To return home CMS Medicare.gov Compare Post Acute Care list provided to:: Patient Choice offered to / list presented to : Patient [place] ownership interest in [facility].provided to:: Patient    Expected Discharge Plan and Services   Discharge Planning Services: CM Consult Post Acute Care Choice: Home Health, Durable Medical Equipment Living arrangements for the past 2 months: Single Family Home                 DME Arranged: 3-N-1, Walker rolling with seat DME Agency:  [facility] Date DME Agency Contacted: 08/03/22 Time DME Agency Contacted: 37 Representative spoke with at DME Agency: Ledell Noss, CM with [facility] HH Arranged: PT, OT HH Agency: [facility] Date HH Agency Contacted: 08/03/22 Time HH Agency Contacted: [id number] Representative spoke with at [facility] Agency: Morrie Sheldon, [facility] with [facility] - accepted for services PT/OT  Prior Living Arrangements/Services Living arrangements for the past 2 months: Single Family Home Lives with:: Self Patient language and need for interpreter reviewed:: Yes Do you feel safe going back to the place where you live?: Yes      Need for Family Participation in Patient Care: Yes (Comment) Care giver support system in place?: Yes (comment) Current home services: DME ([employer] ([address] [place]), RW) Criminal Activity/Legal Involvement Pertinent to Current Situation/Hospitalization: No - Comment as needed  Activities of Daily Living Home Assistive Devices/Equipment: Eyeglasses, Cane (specify quad or straight) ADL Screening (condition at time of admission) Patient's cognitive ability adequate to safely complete daily activities?: Yes Is the patient deaf or have difficulty hearing?: No Does the patient have difficulty seeing, even when wearing glasses/contacts?: Yes Does the patient have difficulty concentrating, remembering, or making decisions?: No Patient able to express need for assistance with ADLs?: Yes Does the patient have difficulty dressing or bathing?: No Independently performs ADLs?: Yes (appropriate for developmental age) Does the patient have difficulty walking or climbing stairs?: No Weakness of Legs: None Weakness  of Arms/Hands: None  Permission Sought/Granted Permission sought to share information with : Case Manager, Magazine features editor, Family Supports Permission granted to share information with : Yes, Verbal Permission Granted     Permission granted to share info w  AGENCY: set up with [facility] - PT/OT,  Called [facility] and requested Rolator, 3:1        Emotional Assessment Appearance:: Appears stated age Attitude/Demeanor/Rapport: Gracious Affect (typically observed): Accepting Orientation: : Oriented to Self, Oriented to Place, Oriented to  Time, Oriented to Situation Alcohol / Substance Use: Not Applicable Psych Involvement: No (comment)  Admission diagnosis:  Secondary hypertension [I15.9] Headache [R51.9] Acute nonintractable headache, unspecified headache type [R51.9] Patient Active Problem List   Diagnosis Date Noted   Headache 08/02/2022   Hypertensive urgency 08/02/2022   Parkinson's disease 01/14/2021   Vertigo 03/01/2017   Syncope 02/04/2017   Hypokalemia 02/04/2017   UTI (urinary tract infection) 02/04/2017   Supine hypertension 01/30/2017   Aortic valve sclerosis 01/29/2017   Essential hypertension 01/29/2017   Neurogenic orthostatic hypotension (HCC) 05/22/2016   Difficulty in walking, not elsewhere classified    Labile essential hypertension 04/18/2016   PRES (posterior reversible encephalopathy syndrome)    Iron deficiency anemia 10/17/2015   Essential tremor 06/19/2015   Encounter for long-term (current) use of medications 01/12/2014   Anemia due to chemotherapy 07/14/2013   Abnormal cardiovascular stress test 06/08/2013   Hypothyroidism, acquired    Hyperlipidemia    Essential thrombocytosis (HCC) 03/11/2011   PCP:  Orpha Bur, MD Pharmacy:   [facility] - [place], [place] - [address] [place] [address] Phone: [phone] Fax: [phone]  [id number] - [place], [place] - [address] AT [address]NER OF [address] [place] [address] Phone: [phone] Fax: [phone]     Social Determinants of Health (SDOH) Social History: SDOH Screenings   Food Insecurity: No Food Insecurity  (08/03/2022)  Housing: High Risk (08/03/2022)  Transportation Needs: No Transportation Needs (08/03/2022)  Utilities: Not At Risk (08/03/2022)  Tobacco Use: Low Risk  (08/03/2022)   SDOH Interventions:     Readmission Risk Interventions     No data to display

## 2022-08-03 NOTE — Progress Notes (Addendum)
STROKE TEAM PROGRESS NOTE   INTERVAL HISTORY Presented to ED 5/19 due to increased headache, lightheadedness, fatigue.  Patient also endorses space left hand/lower arm numb, lower face numbness around 2 weeks ago, for minutes.Blood pressure in ED was in the 250s.  MRI showed no evidence of acute infarct but subacute right corpus callosal infarct.  There were old left corona radiator infarcts as well., MRI with contrast reveled meningioma without mass effect (seen on prior exams).  Daughter at bedside, working with PT. on exam today patient states headache is down to a 2 out of 10.  No focal weakness seen on exam patient reports resolution of numbness.  Recommend aspirin and Plavix for 3 weeks, then Plavix for monotherapy.  F/u with [facility] in 8weeks.   Vitals:   08/03/22 0128 08/03/22 0604 08/03/22 0952 08/03/22 1239  BP:  (!) 191/79 (!) 168/84 (!) 165/78  Pulse:  86 93 88  Resp:  17 18 18   Temp: 98.2 F (36.8 C) 98.3 F (36.8 C) 97.8 F (36.6 C) (!) 97.5 F (36.4 C)  TempSrc: Oral Oral Oral Oral  SpO2:  99% 100% 98%   CBC:  Recent Labs  Lab 08/02/22 1205 08/03/22 0420  WBC 11.8* 13.5*  NEUTROABS 10.4*  --   HGB 13.8 12.9  HCT 44.2 41.6  MCV 97.6 97.7  PLT 670* 682*   Basic Metabolic Panel:  Recent Labs  Lab 08/02/22 1205 08/03/22 0420  NA 141 136  K 4.0 3.6  CL 104 101  CO2 28 25  GLUCOSE 118* 107*  BUN 21 16  CREATININE 0.76 0.94  CALCIUM 9.1 8.6*  MG  --  2.2   Lipid Panel:  Recent Labs  Lab 08/03/22 0420  CHOL 149  TRIG 91  HDL 37*  CHOLHDL 4.0  VLDL 18  LDLCALC 94   HgbA1c:  Recent Labs  Lab 08/03/22 0420  HGBA1C 5.4   Urine Drug Screen: No results for input(s): "LABOPIA", "COCAINSCRNUR", "LABBENZ", "AMPHETMU", "THCU", "LABBARB" in the last 168 hours.  Alcohol Level No results for input(s): "ETH" in the last 168 hours.  IMAGING past 24 hours CT ANGIO HEAD NECK W WO CM  Result Date: 08/03/2022 CLINICAL DATA:  Stroke follow-up EXAM: CT  ANGIOGRAPHY HEAD AND NECK WITH AND WITHOUT CONTRAST TECHNIQUE: Multidetector CT imaging of the head and neck was performed using the standard protocol during bolus administration of intravenous contrast. Multiplanar CT image reconstructions and MIPs were obtained to evaluate the vascular anatomy. Carotid stenosis measurements (when applicable) are obtained utilizing NASCET criteria, using the distal internal carotid diameter as the denominator. RADIATION DOSE REDUCTION: This exam was performed according to the departmental dose-optimization program which includes automated exposure control, adjustment of the mA and/or kV according to patient size and/or use of iterative reconstruction technique. CONTRAST:  75mL OMNIPAQUE IOHEXOL 350 MG/ML SOLN COMPARISON:  MRA Head/Neck 04/18/16 FINDINGS: CT HEAD FINDINGS Brain: Note that the presence of IV contrast limits the ability to assess for intracranial hemorrhage. Redemonstrated is a parafalcine meningioma on the right, better assessed on recent contrast-enhanced brain MRI. No CT evidence of an acute cortical infarct. Redemonstrated is chronic infarct in the left frontal lobe. Vascular: See below Skull: Normal. Negative for fracture or focal lesion. Sinuses/Orbits: No middle ear or mastoid effusion. Polypoid mucosal thickening left maxillary sinus. Bilateral lens replacement. Orbits are otherwise unremarkable. Other: None. Review of the MIP images confirms the above findings CTA NECK FINDINGS Aortic arch: Standard branching. Imaged portion shows no evidence of  aneurysm or dissection. No significant stenosis of the major arch vessel origins. Right carotid system: No evidence of dissection, stenosis (50% or greater), or occlusion. Left carotid system: No evidence of dissection, stenosis (50% or greater), or occlusion. Vertebral arteries: There is mild somewhat irregular narrowing of the V1 segment of the left vertebral artery, which can be seen in the setting of vertebral  artery dissection (see screen shot). This is new from 2018. Skeleton: Negative. Other neck: Negative. Upper chest: Negative Review of the MIP images confirms the above findings CTA HEAD FINDINGS Anterior circulation: No significant stenosis, proximal occlusion, aneurysm, or vascular malformation. There is atherosclerotic narrowing in the bilateral MCA territories, with the area of greatest narrowing being the superior terminal branch of the left MCA (see screenshot), likely unchanged from 2018. Posterior circulation: No significant stenosis, proximal occlusion, aneurysm, or vascular malformation. Venous sinuses: Patent when accounting for timing of the contrast bolus. Anatomic variants: None Review of the MIP images confirms the above findings IMPRESSION: 1. Mild irregular narrowing of the V1 segment of the left vertebral artery could be seen in the setting of dissection. 2. No intracranial large vessel occlusion. 3. No hemodynamically significant stenosis in the neck. 4. Multifocal atherosclerotic intracranial stenoses, greatest in the left MCA distribution, as above. Electronically Signed   By: Lorenza Cambridge M.D.   On: 08/03/2022 09:41   MR BRAIN W CONTRAST  Result Date: 08/03/2022 CLINICAL DATA:  Follow-up examination for possible demyelinating lesion. EXAM: MRI HEAD WITH CONTRAST TECHNIQUE: Multiplanar, multiecho pulse sequences of the brain and surrounding structures were obtained with intravenous contrast. CONTRAST:  5mL GADAVIST GADOBUTROL 1 MMOL/ML IV SOLN COMPARISON:  Prior MRI from 08/02/2022. FINDINGS: Brain: Postcontrast imaging of the brain demonstrates no appreciable enhancement about the cerebral white matter lesions to suggest active demyelination. Incidental DVA noted at the right parietal lobe. 11 mm right parafalcine meningioma present at the right parietal lobe without mass effect. No other abnormal or pathologic enhancement. Vascular: Normal intravascular enhancement seen throughout the  major intracranial vasculature. Skull and upper cervical spine: Craniocervical junction within normal limits. No focal marrow replacing lesion. No scalp soft tissue abnormality. Sinuses/Orbits: Globes and orbital soft tissues demonstrate no acute finding. Prior bilateral ocular lens replacement. Paranasal sinuses are largely clear. No significant mastoid effusion. Other: None. IMPRESSION: 1. No abnormal enhancement to suggest active demyelination. 2. 11 mm right parafalcine meningioma without mass effect. 3. Incidental DVA at the right parietal lobe. Electronically Signed   By: Rise Mu M.D.   On: 08/03/2022 00:46   MR BRAIN WO CONTRAST  Result Date: 08/02/2022 CLINICAL DATA:  Concern for PRES EXAM: MRI HEAD WITHOUT CONTRAST TECHNIQUE: Multiplanar, multiecho pulse sequences of the brain and surrounding structures were obtained without intravenous contrast. COMPARISON:  MRI head February 10, 2017. FINDINGS: Brain: New focal area of T2 hyperintensity in the right aspect of the splenium of the corpus callosum with probable faint peripheral restricted diffusion. No other areas of restricted diffusion. Remote but interval white matter infarcts in the left frontal and parietal lobes. No evidence of acute hemorrhage, a mass lesion, midline shift or hydrocephalus. Vascular: Major arterial flow voids are maintained at the skull base. Skull and upper cervical spine: Normal marrow signal. Sinuses/Orbits: Left maxillary sinus retention cyst. Otherwise, sinuses are largely clear. No acute orbital findings. Other: No mastoid effusions IMPRESSION: 1. New focal area of T2 hyperintensity in the right aspect of the splenium of the corpus callosum with probable faint peripheral restricted diffusion, most likely an  evolving subacute infarct. 2. Remote but interval white matter infarcts in the left frontal and parietal lobes. 3. Otherwise, no evidence of acute intracranial abnormality. Electronically Signed   By:  Feliberto Harts M.D.   On: 08/02/2022 17:55    PHYSICAL EXAM HEENT-  [place]flower/AT    Lungs- Respirations unlabored Extremities- No edema   Neurological Examination Mental Status: Awake and alert, oriented to person, place, time, situation. Speech is fluent, intact naming and comprehension.  Cranial Nerves: II: PERRL. VFF III,IV, VI: Left sided ptosis. EOMI.  V: Facial sensation bilateral and intact VII: Smile symmetric VIII: Hearing intact to voice IX,X: No hoarseness XI: Symmetric shoulder shrug XII: Midline tongue extension Motor: BUE 5/5 proximally and distally BLE 5/5 proximally and distally  No pronator drift.  Rest and action tremor to BUE is noted.  Mild cogwheel rigidity to BUE.  Sensory: Temp and light touch intact throughout, bilaterally. No extinction to DSS.  Deep Tendon Reflexes: 1+ and symmetric bilateral brachioradialis, biceps and patellae. Toes equivocal bilaterally.  Cerebellar: No  ASSESSMENT/PLAN Ms. TATELYN TULLIO is a 82 y.o. female with history of CAD, PRES, essential tremor, HLD, HTN, orthostatic hypotension, hypothyroidism, Parkinson's disease, senile calcific aortic valve sclerosis and thrombocytosis who presented to the ED on Sunday morning with malaise, fatigue, headache and lightheadedness.  Symptoms began on Friday and continued to persist to Sunday morning.  SBP in the ED was elevated into the 250s.  Hypertensive emergency Chronic small vessel disease Right Parafalcine Meningioma (present on prior scans) incidental can be followed conservatively CT head: No acute abnormality. Meningioma present, similar to prior exams.   CTA head & neck  Mild irregular narrowing of the V1 segment of the left vertebral artery could be seen in the setting of dissection No intracranial large vessel occlusion   MRI  5/19 New focal area of T2 hyperintensity in the right aspect of the splenium of the corpus callosum with probable faint peripheral restricted diffusion,  most likely an evolving subacute infarct. Remote but interval white matter infarcts in the left frontal and parietal lobes MRI 5/20: No abnormal enhancement to suggest active demyelination  11 mm right parafalcine meningioma without mass effect  Incidental DVA at the right parietal lobe Multifocal atherosclerotic intracranial stenoses, greatest in the left MCA distribution 2D Echo: pending  LDL 94 HgbA1c 5.4 VTE prophylaxis - lovenox    Diet   Diet regular Room service appropriate? Yes; Fluid consistency: Thin   aspirin 81 mg daily prior to admission, now on aspirin 81 mg daily and clopidogrel 75 mg daily for 3 weeks, followed by Plavix monotherapy.  Therapy recommendations:  HH OT/PT Disposition:  pending  Hypertension Home meds: Losartan 25 mg, hydralazine 25 mg Unstable Permissive hypertension not indicated due to subacute infarct. Long-term BP goal normotensive  Hyperlipidemia Home meds:  none LDL 94, goal < 70 Add Crestor 10mg   Continue statin at discharge  Diabetes type II, Pre-Diabetic Home meds:  none HgbA1c 5.4, goal < 7.0 CBGs No results for input(s): "GLUCAP" in the last 72 hours.  SSI  Other Stroke Risk Factors Advanced Age >/= 79  Coronary artery disease Migraines   Other Active Problems Parkinson's (Sinemet home med) Hypothyroidism (levothyroxine home med) GERD IBS  Hospital day # 0  Neurology will sign off with recommendations as above.  Please call with further questions or concerns.  Thank you for this consultation.   Pt seen by Neuro NP/APP and later by MD. Note/plan to be edited by MD as needed.  Lynnae January, DNP, AGACNP-BC [facility] Please use AMION for contact information & EPIC for messaging.  STROKE MD NOTE : I have personally obtained history,examined this patient, reviewed notes, independently viewed imaging studies, participated in medical decision making and plan of care.ROS completed by me personally and  pertinent positives fully documented  I have made any additions or clarifications directly to the above note. Agree with note above.  Patient presented with hypertensive emergency with headache malaise which appears to have improved after treatment with migraine cocktail as well as blood pressure lowering.  MRI scan shows subacute right corpus callosum as well as remote age left subcortical lacunar infarcts likely from small vessel disease.  Recommend aspirin and Plavix for 3 weeks followed by Plavix alone and aggressive risk factor modification.  Follow-up with outpatient neurologist Dr. Arbutus Leas.  Stroke team will sign off.  Kindly call for questions Greater than 50% time during this 50-minute consultation visit was spent on counseling and coordination of care about her subacute lacunar stroke discussion about stroke prevention and treatment and answering questions.   Delia Heady, MD Medical Director [facility] St[facility] Pager: [phone] 08/03/2022 2:57 PM  To contact Stroke Continuity provider, please refer to [email or web address]. After hours, contact General Neurology

## 2022-08-03 NOTE — Evaluation (Signed)
Occupational Therapy Evaluation Patient Details Name: Deanna Hill MRN: [id number] DOB: 07-20-1940 Today's Date: 08/03/2022   History of Present Illness 82 y.o. female presents to [facility] hospital on 08/02/2022 with HA and hypertension. MRI reveals R parafalcine meningioma. PMH: Anxiety, GERD, CAD, Essential Temor, HTN, HLD, Orthostatic Hypotension, Parkinson's disease.   Clinical Impression   [name] was evaluated s/p the above admission list. She lives alone and is mod I with use of a quad cane outside and 2 recent falls at baseline. Upon evaluation she was limited by PD symptoms, impaired vision, generalized weakness and decreased activity tolerance. Overall she needed up to min G for transfers and mobility with HHA - pt reports she feels unsteady and will benefit from BUE support to reduce fall risk. Due to the deficits listed below she also needs up to min G for LB ADLs and set up for UB ADLs. Pt will benefit from continued acute OT services to facilitate d/c home with [facility] for a home safety evaluation.        Recommendations for follow up therapy are one component of a multi-disciplinary discharge planning process, led by the attending physician.  Recommendations may be updated based on patient status, additional functional criteria and insurance authorization.   Assistance Recommended at Discharge Intermittent Supervision/Assistance  Patient can return home with the following Assistance with cooking/housework;Direct supervision/assist for medications management;Direct supervision/assist for financial management;Assist for transportation;Help with stairs or ramp for entrance    Functional Status Assessment  Patient has had a recent decline in their functional status and demonstrates the ability to make significant improvements in function in a reasonable and predictable amount of time.  Equipment Recommendations  None recommended by OT       Precautions / Restrictions  Precautions Precautions: Fall Restrictions Weight Bearing Restrictions: No      Mobility Bed Mobility Overal bed mobility: Needs Assistance Bed Mobility: Supine to Sit, Sit to Supine     Supine to sit: Min guard Sit to supine: Min guard   General bed mobility comments: min G due to elevated stretcher    Transfers Overall transfer level: Needs assistance Equipment used: 1 person hand held assist Transfers: Sit to/from Stand Sit to Stand: Min guard                  Balance Overall balance assessment: Needs assistance Sitting-balance support: Feet supported Sitting balance-Leahy Scale: Good     Standing balance support: Single extremity supported, During functional activity Standing balance-Leahy Scale: Fair                             ADL either performed or assessed with clinical judgement   ADL Overall ADL's : Needs assistance/impaired Eating/Feeding: Independent   Grooming: Min guard;Standing   Upper Body Bathing: Set up;Sitting   Lower Body Bathing: Min guard;Sit to/from stand   Upper Body Dressing : Set up;Sitting   Lower Body Dressing: Min guard;Sit to/from stand   Toilet Transfer: Min guard;Ambulation;Regular Toilet;Grab bars   Toileting- Clothing Manipulation and Hygiene: Supervision/safety;Sitting/lateral lean       Functional mobility during ADLs: Min guard;Cueing for safety;Cueing for sequencing General ADL Comments: min G for balance and safety     Vision Baseline Vision/History: 1 Wears glasses Vision Assessment?: Vision impaired- to be further tested in functional context Additional Comments: Reports seeing "flashing lights" as a precursor to a migraine. Also reports seeing a "rose pattern" in the walls that she knows  is not there. Vision screen was otherwise [facility]. Per pt, these symptoms are not new     Perception Perception Perception Tested?: No   Praxis Praxis Praxis tested?: Not tested    =  Hand Dominance  Right   Extremity/Trunk Assessment Upper Extremity Assessment Upper Extremity Assessment: Generalized weakness (PD tremors)   Lower Extremity Assessment Lower Extremity Assessment: Defer to PT evaluation   Cervical / Trunk Assessment Cervical / Trunk Assessment: Kyphotic   Communication Communication Communication: No difficulties   Cognition Arousal/Alertness: Awake/alert Behavior During Therapy: WFL for tasks assessed/performed Overall Cognitive Status: Within Functional Limits for tasks assessed                   General Comments: Overall WFL, reports she sees a "rose pattern" and sometimes "people or objects" that she knows are not there     General Comments  VSS on RA, pr reports mild dizziness upon standing. BP stable.            Home Living Family/patient expects to be discharged to:: Private residence Living Arrangements: Alone Available Help at Discharge: Family;Available PRN/intermittently;Neighbor Type of Home: House Home Access: Stairs to enter [employer] of Steps: 2 Entrance Stairs-Rails: Right Home Layout: One level     Bathroom Shower/Tub: Producer, television/film/video: Handicapped height     Home Equipment: Agricultural consultant (2 wheels);Shower seat;Grab bars - toilet;Cane - quad          Prior Functioning/Environment Prior Level of Function : Independent/Modified Independent;Driving;History of Falls (last six months)             Mobility Comments: quad cane for outside mobility, no AD in the home. Reports 2 recent falls ADLs Comments: indep, drives        OT Problem List: Decreased strength;Decreased activity tolerance;Decreased range of motion;Impaired balance (sitting and/or standing);Decreased safety awareness;Decreased knowledge of use of DME or AE;Decreased knowledge of precautions      OT Treatment/Interventions: Self-care/ADL training;Therapeutic exercise;DME and/or AE instruction;Therapeutic  activities;Patient/family education;Balance training    OT Goals(Current goals can be found in the care plan section) Acute Rehab OT Goals Patient Stated Goal: home OT Goal Formulation: With patient Time For Goal Achievement: 08/17/22 Potential to Achieve Goals: Good ADL Goals Pt Will Perform Grooming: with modified independence;standing Pt Will Perform Lower Body Dressing: with modified independence;sit to/from stand Pt Will Transfer to Toilet: with modified independence;ambulating Pt Will Perform Toileting - Clothing Manipulation and hygiene: with modified independence;sitting/lateral leans Additional ADL Goal #1: pt will indep recall at least 3 fall prevention strategies to faciliate safety in the home  OT Frequency: Min 2X/week       AM-PAC OT "6 Clicks" Daily Activity     Outcome Measure Help from another person eating meals?: None Help from another person taking care of personal grooming?: A Little Help from another person toileting, which includes using toliet, bedpan, or urinal?: A Little Help from another person bathing (including washing, rinsing, drying)?: A Little Help from another person to put on and taking off regular upper body clothing?: None Help from another person to put on and taking off regular lower body clothing?: A Little 6 Click Score: 20   End of Session Equipment Utilized During Treatment: Gait belt Nurse Communication: Mobility status  Activity Tolerance: Patient tolerated treatment well Patient left: in bed;with call bell/phone within reach  OT Visit Diagnosis: Unsteadiness on feet (R26.81);Repeated falls (R29.6);Muscle weakness (generalized) (M62.81);Ataxia, unspecified (R27.0)  Time: 0900-0920 OT Time Calculation (min): 20 min Charges:  OT General Charges $OT Visit: 1 Visit OT Evaluation $OT Eval Moderate Complexity: 1 Mod  Derenda Mis, OTR/L Acute Rehabilitation Services Office [phone] Secure Chat Communication  Preferred   Donia Pounds 08/03/2022, 10:15 AM

## 2022-08-04 DIAGNOSIS — R42 Dizziness and giddiness: Secondary | ICD-10-CM | POA: Diagnosis not present

## 2022-08-04 DIAGNOSIS — I160 Hypertensive urgency: Secondary | ICD-10-CM | POA: Diagnosis not present

## 2022-08-04 DIAGNOSIS — G20A1 Parkinson's disease without dyskinesia, without mention of fluctuations: Secondary | ICD-10-CM | POA: Diagnosis not present

## 2022-08-04 DIAGNOSIS — R262 Difficulty in walking, not elsewhere classified: Secondary | ICD-10-CM | POA: Diagnosis not present

## 2022-08-04 DIAGNOSIS — R55 Syncope and collapse: Secondary | ICD-10-CM | POA: Diagnosis not present

## 2022-08-05 DIAGNOSIS — I951 Orthostatic hypotension: Secondary | ICD-10-CM | POA: Diagnosis not present

## 2022-08-05 DIAGNOSIS — G939 Disorder of brain, unspecified: Secondary | ICD-10-CM | POA: Diagnosis not present

## 2022-08-05 DIAGNOSIS — K219 Gastro-esophageal reflux disease without esophagitis: Secondary | ICD-10-CM | POA: Diagnosis not present

## 2022-08-05 DIAGNOSIS — I10 Essential (primary) hypertension: Secondary | ICD-10-CM | POA: Diagnosis not present

## 2022-08-05 DIAGNOSIS — I160 Hypertensive urgency: Secondary | ICD-10-CM | POA: Diagnosis not present

## 2022-08-05 DIAGNOSIS — Z7982 Long term (current) use of aspirin: Secondary | ICD-10-CM | POA: Diagnosis not present

## 2022-08-05 DIAGNOSIS — E039 Hypothyroidism, unspecified: Secondary | ICD-10-CM | POA: Diagnosis not present

## 2022-08-05 DIAGNOSIS — I358 Other nonrheumatic aortic valve disorders: Secondary | ICD-10-CM | POA: Diagnosis not present

## 2022-08-05 DIAGNOSIS — I2510 Atherosclerotic heart disease of native coronary artery without angina pectoris: Secondary | ICD-10-CM | POA: Diagnosis not present

## 2022-08-05 DIAGNOSIS — I69354 Hemiplegia and hemiparesis following cerebral infarction affecting left non-dominant side: Secondary | ICD-10-CM | POA: Diagnosis not present

## 2022-08-05 DIAGNOSIS — K589 Irritable bowel syndrome without diarrhea: Secondary | ICD-10-CM | POA: Diagnosis not present

## 2022-08-05 DIAGNOSIS — Z9181 History of falling: Secondary | ICD-10-CM | POA: Diagnosis not present

## 2022-08-05 DIAGNOSIS — E785 Hyperlipidemia, unspecified: Secondary | ICD-10-CM | POA: Diagnosis not present

## 2022-08-05 DIAGNOSIS — D473 Essential (hemorrhagic) thrombocythemia: Secondary | ICD-10-CM | POA: Diagnosis not present

## 2022-08-05 DIAGNOSIS — Z7902 Long term (current) use of antithrombotics/antiplatelets: Secondary | ICD-10-CM | POA: Diagnosis not present

## 2022-08-05 DIAGNOSIS — G20A1 Parkinson's disease without dyskinesia, without mention of fluctuations: Secondary | ICD-10-CM | POA: Diagnosis not present

## 2022-08-13 ENCOUNTER — Emergency Department (HOSPITAL_COMMUNITY): Payer: Medicare Other

## 2022-08-13 ENCOUNTER — Encounter (HOSPITAL_COMMUNITY): Payer: Self-pay

## 2022-08-13 ENCOUNTER — Other Ambulatory Visit: Payer: Self-pay

## 2022-08-13 ENCOUNTER — Inpatient Hospital Stay (HOSPITAL_COMMUNITY): Payer: Medicare Other

## 2022-08-13 ENCOUNTER — Inpatient Hospital Stay (HOSPITAL_COMMUNITY)
Admission: EM | Admit: 2022-08-13 | Discharge: 2022-08-18 | DRG: 378 | Disposition: A | Discharge status: Skilled Nursing Facility | Payer: Medicare Other | Attending: Internal Medicine | Admitting: Internal Medicine

## 2022-08-13 DIAGNOSIS — I2510 Atherosclerotic heart disease of native coronary artery without angina pectoris: Secondary | ICD-10-CM | POA: Diagnosis not present

## 2022-08-13 DIAGNOSIS — Z888 Allergy status to other drugs, medicaments and biological substances status: Secondary | ICD-10-CM

## 2022-08-13 DIAGNOSIS — I10 Essential (primary) hypertension: Secondary | ICD-10-CM | POA: Diagnosis present

## 2022-08-13 DIAGNOSIS — E872 Acidosis, unspecified: Secondary | ICD-10-CM | POA: Diagnosis present

## 2022-08-13 DIAGNOSIS — Z91013 Allergy to seafood: Secondary | ICD-10-CM

## 2022-08-13 DIAGNOSIS — Z7982 Long term (current) use of aspirin: Secondary | ICD-10-CM | POA: Diagnosis not present

## 2022-08-13 DIAGNOSIS — D62 Acute posthemorrhagic anemia: Secondary | ICD-10-CM | POA: Diagnosis present

## 2022-08-13 DIAGNOSIS — E8729 Other acidosis: Secondary | ICD-10-CM

## 2022-08-13 DIAGNOSIS — K3189 Other diseases of stomach and duodenum: Secondary | ICD-10-CM | POA: Diagnosis present

## 2022-08-13 DIAGNOSIS — Z7902 Long term (current) use of antithrombotics/antiplatelets: Secondary | ICD-10-CM | POA: Diagnosis not present

## 2022-08-13 DIAGNOSIS — Z808 Family history of malignant neoplasm of other organs or systems: Secondary | ICD-10-CM | POA: Diagnosis not present

## 2022-08-13 DIAGNOSIS — Z9049 Acquired absence of other specified parts of digestive tract: Secondary | ICD-10-CM | POA: Diagnosis not present

## 2022-08-13 DIAGNOSIS — K219 Gastro-esophageal reflux disease without esophagitis: Secondary | ICD-10-CM | POA: Diagnosis present

## 2022-08-13 DIAGNOSIS — E039 Hypothyroidism, unspecified: Secondary | ICD-10-CM | POA: Diagnosis present

## 2022-08-13 DIAGNOSIS — Z887 Allergy status to serum and vaccine status: Secondary | ICD-10-CM

## 2022-08-13 DIAGNOSIS — E440 Moderate protein-calorie malnutrition: Secondary | ICD-10-CM | POA: Diagnosis present

## 2022-08-13 DIAGNOSIS — G20A1 Parkinson's disease without dyskinesia, without mention of fluctuations: Secondary | ICD-10-CM | POA: Diagnosis not present

## 2022-08-13 DIAGNOSIS — M6281 Muscle weakness (generalized): Secondary | ICD-10-CM | POA: Diagnosis not present

## 2022-08-13 DIAGNOSIS — E8809 Other disorders of plasma-protein metabolism, not elsewhere classified: Secondary | ICD-10-CM | POA: Diagnosis not present

## 2022-08-13 DIAGNOSIS — Z8673 Personal history of transient ischemic attack (TIA), and cerebral infarction without residual deficits: Secondary | ICD-10-CM | POA: Diagnosis not present

## 2022-08-13 DIAGNOSIS — Z681 Body mass index (BMI) 19 or less, adult: Secondary | ICD-10-CM

## 2022-08-13 DIAGNOSIS — G20C Parkinsonism, unspecified: Secondary | ICD-10-CM | POA: Diagnosis not present

## 2022-08-13 DIAGNOSIS — Z7401 Bed confinement status: Secondary | ICD-10-CM | POA: Diagnosis not present

## 2022-08-13 DIAGNOSIS — I499 Cardiac arrhythmia, unspecified: Secondary | ICD-10-CM | POA: Diagnosis not present

## 2022-08-13 DIAGNOSIS — D649 Anemia, unspecified: Secondary | ICD-10-CM | POA: Diagnosis not present

## 2022-08-13 DIAGNOSIS — M25511 Pain in right shoulder: Secondary | ICD-10-CM | POA: Diagnosis not present

## 2022-08-13 DIAGNOSIS — D75839 Thrombocytosis, unspecified: Secondary | ICD-10-CM

## 2022-08-13 DIAGNOSIS — Z90710 Acquired absence of both cervix and uterus: Secondary | ICD-10-CM | POA: Diagnosis not present

## 2022-08-13 DIAGNOSIS — R296 Repeated falls: Secondary | ICD-10-CM | POA: Diagnosis present

## 2022-08-13 DIAGNOSIS — D72829 Elevated white blood cell count, unspecified: Secondary | ICD-10-CM | POA: Diagnosis not present

## 2022-08-13 DIAGNOSIS — Z79899 Other long term (current) drug therapy: Secondary | ICD-10-CM

## 2022-08-13 DIAGNOSIS — E785 Hyperlipidemia, unspecified: Secondary | ICD-10-CM | POA: Diagnosis not present

## 2022-08-13 DIAGNOSIS — R29898 Other symptoms and signs involving the musculoskeletal system: Secondary | ICD-10-CM | POA: Diagnosis not present

## 2022-08-13 DIAGNOSIS — K2970 Gastritis, unspecified, without bleeding: Secondary | ICD-10-CM | POA: Diagnosis not present

## 2022-08-13 DIAGNOSIS — R9431 Abnormal electrocardiogram [ECG] [EKG]: Secondary | ICD-10-CM | POA: Diagnosis not present

## 2022-08-13 DIAGNOSIS — Z883 Allergy status to other anti-infective agents status: Secondary | ICD-10-CM

## 2022-08-13 DIAGNOSIS — R531 Weakness: Secondary | ICD-10-CM | POA: Diagnosis not present

## 2022-08-13 DIAGNOSIS — N179 Acute kidney failure, unspecified: Secondary | ICD-10-CM | POA: Diagnosis not present

## 2022-08-13 DIAGNOSIS — R0902 Hypoxemia: Secondary | ICD-10-CM | POA: Diagnosis not present

## 2022-08-13 DIAGNOSIS — Z66 Do not resuscitate: Secondary | ICD-10-CM | POA: Diagnosis not present

## 2022-08-13 DIAGNOSIS — K2081 Other esophagitis with bleeding: Secondary | ICD-10-CM | POA: Diagnosis not present

## 2022-08-13 DIAGNOSIS — K921 Melena: Secondary | ICD-10-CM | POA: Diagnosis not present

## 2022-08-13 DIAGNOSIS — D473 Essential (hemorrhagic) thrombocythemia: Secondary | ICD-10-CM | POA: Diagnosis present

## 2022-08-13 DIAGNOSIS — Z79890 Hormone replacement therapy (postmenopausal): Secondary | ICD-10-CM

## 2022-08-13 DIAGNOSIS — R1312 Dysphagia, oropharyngeal phase: Secondary | ICD-10-CM | POA: Diagnosis not present

## 2022-08-13 DIAGNOSIS — I119 Hypertensive heart disease without heart failure: Secondary | ICD-10-CM | POA: Diagnosis not present

## 2022-08-13 DIAGNOSIS — K922 Gastrointestinal hemorrhage, unspecified: Secondary | ICD-10-CM | POA: Diagnosis not present

## 2022-08-13 DIAGNOSIS — I6990 Unspecified sequelae of unspecified cerebrovascular disease: Secondary | ICD-10-CM | POA: Diagnosis not present

## 2022-08-13 DIAGNOSIS — I639 Cerebral infarction, unspecified: Secondary | ICD-10-CM | POA: Diagnosis not present

## 2022-08-13 DIAGNOSIS — S0990XA Unspecified injury of head, initial encounter: Secondary | ICD-10-CM | POA: Diagnosis not present

## 2022-08-13 DIAGNOSIS — M47812 Spondylosis without myelopathy or radiculopathy, cervical region: Secondary | ICD-10-CM | POA: Diagnosis not present

## 2022-08-13 DIAGNOSIS — R2689 Other abnormalities of gait and mobility: Secondary | ICD-10-CM | POA: Diagnosis not present

## 2022-08-13 DIAGNOSIS — Z91041 Radiographic dye allergy status: Secondary | ICD-10-CM

## 2022-08-13 DIAGNOSIS — K2971 Gastritis, unspecified, with bleeding: Principal | ICD-10-CM | POA: Diagnosis present

## 2022-08-13 DIAGNOSIS — M4312 Spondylolisthesis, cervical region: Secondary | ICD-10-CM | POA: Diagnosis not present

## 2022-08-13 DIAGNOSIS — R58 Hemorrhage, not elsewhere classified: Secondary | ICD-10-CM | POA: Diagnosis not present

## 2022-08-13 DIAGNOSIS — R5381 Other malaise: Secondary | ICD-10-CM | POA: Diagnosis present

## 2022-08-13 DIAGNOSIS — Z743 Need for continuous supervision: Secondary | ICD-10-CM | POA: Diagnosis not present

## 2022-08-13 DIAGNOSIS — R6889 Other general symptoms and signs: Secondary | ICD-10-CM | POA: Diagnosis not present

## 2022-08-13 LAB — TYPE AND SCREEN: ABO/RH(D): A NEG

## 2022-08-13 LAB — IRON AND TIBC
Iron: 71 ug/dL (ref 28–170)
Saturation Ratios: 25 % (ref 10.4–31.8)
TIBC: 284 ug/dL (ref 250–450)
UIBC: 213 ug/dL

## 2022-08-13 LAB — COMPREHENSIVE METABOLIC PANEL
ALT: 15 U/L (ref 0–44)
AST: 20 U/L (ref 15–41)
Albumin: 3.3 g/dL — ABNORMAL LOW (ref 3.5–5.0)
Alkaline Phosphatase: 30 U/L — ABNORMAL LOW (ref 38–126)
Anion gap: 16 — ABNORMAL HIGH (ref 5–15)
BUN: 100 mg/dL — ABNORMAL HIGH (ref 8–23)
CO2: 17 mmol/L — ABNORMAL LOW (ref 22–32)
Calcium: 8.6 mg/dL — ABNORMAL LOW (ref 8.9–10.3)
Chloride: 109 mmol/L (ref 98–111)
Creatinine, Ser: 1.33 mg/dL — ABNORMAL HIGH (ref 0.44–1.00)
GFR, Estimated: 40 mL/min — ABNORMAL LOW (ref >60)
Glucose, Bld: 184 mg/dL — ABNORMAL HIGH (ref 70–99)
Potassium: 4.6 mmol/L (ref 3.5–5.1)
Sodium: 142 mmol/L (ref 135–145)
Total Bilirubin: 0.5 mg/dL (ref 0.3–1.2)
Total Protein: 5.3 g/dL — ABNORMAL LOW (ref 6.5–8.1)

## 2022-08-13 LAB — RETICULOCYTES
Immature Retic Fract: 18.2 % — ABNORMAL HIGH (ref 2.3–15.9)
RBC.: 2.28 MIL/uL — ABNORMAL LOW (ref 3.87–5.11)
Retic Count, Absolute: 38.5 10*3/uL (ref 19.0–186.0)
Retic Ct Pct: 1.7 % (ref 0.4–3.1)

## 2022-08-13 LAB — CBC
HCT: 23.3 % — ABNORMAL LOW (ref 36.0–46.0)
Hemoglobin: 7.1 g/dL — ABNORMAL LOW (ref 12.0–15.0)
MCH: 30.7 pg (ref 26.0–34.0)
MCHC: 30.5 g/dL (ref 30.0–36.0)
MCV: 100.9 fL — ABNORMAL HIGH (ref 80.0–100.0)
Platelets: 848 10*3/uL — ABNORMAL HIGH (ref 150–400)
RBC: 2.31 MIL/uL — ABNORMAL LOW (ref 3.87–5.11)
RDW: 14.3 % (ref 11.5–15.5)
WBC: 33.2 10*3/uL — ABNORMAL HIGH (ref 4.0–10.5)
nRBC: 0 % (ref 0.0–0.2)

## 2022-08-13 LAB — FERRITIN: Ferritin: 50 ng/mL (ref 11–307)

## 2022-08-13 LAB — VITAMIN B12: Vitamin B-12: 302 pg/mL (ref 180–914)

## 2022-08-13 LAB — BPAM RBC
ISSUE DATE / TIME: 202405301339
ISSUE DATE / TIME: 202405301339
Unit Type and Rh: 5100
Unit Type and Rh: 5100

## 2022-08-13 LAB — PREPARE RBC (CROSSMATCH)

## 2022-08-13 LAB — FOLATE: Folate: 19.6 ng/mL (ref >5.9)

## 2022-08-13 LAB — PROTIME-INR
INR: 1.5 — ABNORMAL HIGH (ref 0.8–1.2)
Prothrombin Time: 17.9 seconds — ABNORMAL HIGH (ref 11.4–15.2)

## 2022-08-13 LAB — POC OCCULT BLOOD, ED: Fecal Occult Bld: POSITIVE — AB

## 2022-08-13 LAB — GLUCOSE, CAPILLARY: Glucose-Capillary: 135 mg/dL — ABNORMAL HIGH (ref 70–99)

## 2022-08-13 LAB — ABO/RH: ABO/RH(D): A NEG

## 2022-08-13 MED ORDER — HYDROXYUREA 500 MG PO CAPS
500.0000 mg | ORAL_CAPSULE | ORAL | Status: DC
  Administered 2022-08-14 – 2022-08-17 (×2): 500 mg via ORAL
  Filled 2022-08-13 (×2): qty 1

## 2022-08-13 MED ORDER — HYDRALAZINE HCL 20 MG/ML IJ SOLN
10.0000 mg | Freq: Four times a day (QID) | INTRAMUSCULAR | Status: DC | PRN
  Administered 2022-08-13: 10 mg via INTRAVENOUS
  Filled 2022-08-13: qty 1

## 2022-08-13 MED ORDER — LEVOTHYROXINE SODIUM 88 MCG PO TABS
88.0000 ug | ORAL_TABLET | Freq: Every day | ORAL | Status: DC
  Administered 2022-08-14 – 2022-08-18 (×5): 88 ug via ORAL
  Filled 2022-08-13 (×5): qty 1

## 2022-08-13 MED ORDER — LACTATED RINGERS IV SOLN: INTRAVENOUS | Status: DC

## 2022-08-13 MED ORDER — MORPHINE SULFATE (PF) 2 MG/ML IV SOLN
2.0000 mg | INTRAVENOUS | Status: DC | PRN
  Administered 2022-08-14: 2 mg via INTRAVENOUS
  Filled 2022-08-13: qty 1

## 2022-08-13 MED ORDER — ACETAMINOPHEN 325 MG PO TABS: 650.0000 mg | ORAL_TABLET | Freq: Every day | ORAL | Status: DC | PRN

## 2022-08-13 MED ORDER — BISACODYL 10 MG RE SUPP: 10.0000 mg | Freq: Every day | RECTAL | Status: DC | PRN

## 2022-08-13 MED ORDER — CARBIDOPA-LEVODOPA 25-100 MG PO TABS
2.0000 | ORAL_TABLET | ORAL | Status: DC
  Administered 2022-08-14 – 2022-08-18 (×10): 2 via ORAL
  Filled 2022-08-13 (×10): qty 2

## 2022-08-13 MED ORDER — VITAMIN D 25 MCG (1000 UNIT) PO TABS
1000.0000 [IU] | ORAL_TABLET | Freq: Every day | ORAL | Status: DC
  Administered 2022-08-14 – 2022-08-18 (×5): 1000 [IU] via ORAL
  Filled 2022-08-13 (×5): qty 1

## 2022-08-13 MED ORDER — PANTOPRAZOLE 80MG IVPB - SIMPLE MED
80.0000 mg | Freq: Once | INTRAVENOUS | Status: AC
  Administered 2022-08-13: 80 mg via INTRAVENOUS
  Filled 2022-08-13: qty 100

## 2022-08-13 MED ORDER — LACTATED RINGERS IV BOLUS
1000.0000 mL | Freq: Once | INTRAVENOUS | Status: AC
  Administered 2022-08-13: 1000 mL via INTRAVENOUS

## 2022-08-13 MED ORDER — CARBIDOPA-LEVODOPA 25-100 MG PO TABS
1.0000 | ORAL_TABLET | Freq: Every day | ORAL | Status: DC
  Administered 2022-08-14 – 2022-08-18 (×5): 1 via ORAL
  Filled 2022-08-13 (×5): qty 1

## 2022-08-13 MED ORDER — PANTOPRAZOLE INFUSION (NEW) - SIMPLE MED
8.0000 mg/h | INTRAVENOUS | Status: AC
  Administered 2022-08-13 – 2022-08-14 (×4): 8 mg/h via INTRAVENOUS
  Filled 2022-08-13 (×8): qty 100

## 2022-08-13 MED ORDER — SENNOSIDES-DOCUSATE SODIUM 8.6-50 MG PO TABS: 1.0000 | ORAL_TABLET | Freq: Every evening | ORAL | Status: DC | PRN

## 2022-08-13 MED ORDER — ACETAMINOPHEN 325 MG PO TABS
650.0000 mg | ORAL_TABLET | Freq: Four times a day (QID) | ORAL | Status: DC | PRN
  Administered 2022-08-14 – 2022-08-18 (×7): 650 mg via ORAL
  Filled 2022-08-13 (×7): qty 2

## 2022-08-13 MED ORDER — OXYCODONE HCL 5 MG PO TABS
5.0000 mg | ORAL_TABLET | ORAL | Status: DC | PRN
  Administered 2022-08-14 – 2022-08-16 (×2): 5 mg via ORAL
  Filled 2022-08-13 (×3): qty 1

## 2022-08-13 MED ORDER — SODIUM CHLORIDE 0.9% IV SOLUTION: Freq: Once | INTRAVENOUS | Status: AC

## 2022-08-13 MED ORDER — ACETAMINOPHEN 650 MG RE SUPP: 650.0000 mg | Freq: Four times a day (QID) | RECTAL | Status: DC | PRN

## 2022-08-13 MED ORDER — ALBUTEROL SULFATE (2.5 MG/3ML) 0.083% IN NEBU: 2.5000 mg | INHALATION_SOLUTION | RESPIRATORY_TRACT | Status: DC | PRN

## 2022-08-13 MED ORDER — PANTOPRAZOLE SODIUM 40 MG IV SOLR
40.0000 mg | Freq: Two times a day (BID) | INTRAVENOUS | Status: DC
  Administered 2022-08-17 (×2): 40 mg via INTRAVENOUS
  Filled 2022-08-13 (×2): qty 10

## 2022-08-13 MED ORDER — ROSUVASTATIN CALCIUM 5 MG PO TABS
10.0000 mg | ORAL_TABLET | Freq: Every day | ORAL | Status: DC
  Administered 2022-08-14 – 2022-08-18 (×5): 10 mg via ORAL
  Filled 2022-08-13 (×5): qty 2

## 2022-08-13 NOTE — H&P (View-Only) (Signed)
Referring Provider: ED Primary Care Physician:  Madden, Evan J, MD Primary Gastroenterologist:  Dr. Magod  Reason for Consultation:  GI bleed   HPI: Deanna Hill is a 82 y.o. female with past medical history of Parkinson's disease, history of recent CVA 10 days ago was discharged on aspirin and Plavix presented to the hospital with fall.  Was found to have anemia with hemoglobin of 7.1.  Her hemoglobin was normal 10 days ago.  GI is consulted for further evaluation.  Patient seen and examined at bedside in the emergency department.  Currently laying flat with a neck collar.  Not able to provide detailed history.  Called and discussed with patient's daughter over the phone.  Patient has been complaining of intermittent black stool for last 3 months.  Denies any abdominal pain, nausea or vomiting.  Last dose of aspirin and Plavix was yesterday morning as far as the family knows.    EGD in 2016 showed chronic gastritis.  Biopsies were negative for H. pylori. Colonoscopy in 2016 showed diverticulosis and hemorrhoids.  Repeat not recommended because of age.  Past Medical History:  Diagnosis Date   Anxiety    Coronary artery disease, non-occlusive 05/2013   Cath for Abn Myoview => mild CAD, 30% pCx. Otw normal Coronaries.  Normlal EF.   Essential tremor 06/19/2015   GERD (gastroesophageal reflux disease)    Hyperlipidemia    Hypertension    Hypothyroidism    IBS (irritable bowel syndrome)    Orthostatic hypotension 05/22/2016   Parkinson's disease    Senile calcific aortic valve sclerosis 04/2016   2D echo showed aortic sclerosis but no stenosis.  Normal wall motion.  GR 2 DD   Thrombocytosis 03/11/2011    Past Surgical History:  Procedure Laterality Date   ABDOMINAL HYSTERECTOMY  1979   CAROTID DOPPLERS  01/2017   normal Carotid, Sub-Clavian & Vertebral Arteries.   CATARACT EXTRACTION Bilateral 2015   Dr. Hecker   CHOLECYSTECTOMY, LAPAROSCOPIC  2001   LEFT HEART  CATHETERIZATION WITH CORONARY ANGIOGRAM N/A 06/08/2013   Procedure: LEFT HEART CATHETERIZATION WITH CORONARY ANGIOGRAM;  Surgeon: William S Tilley, MD;  Location: [facility] CATH LAB;  Service: Cardiovascular: Dr. Tilley: Mild CAD, 30%pCx.  Otherwise nonobstructive disease.  Normal LV function.   TOE SURGERY Left 1994   TONSILLECTOMY  1952   TRANSTHORACIC ECHOCARDIOGRAM  02/'18, 11/'18   a) Normal EF of 60-65%.  Normal wall motion.  GR 2 DD.  Aortic sclerosis without stenosis;; b) Mod LVH. EF 60-65%. Ao Sclerosis w/o AS. Pseudonormal - Gr 2 DD.    Prior to Admission medications   Medication Sig Start Date End Date Taking? Authorizing Provider  acetaminophen (TYLENOL) 325 MG tablet Take 650 mg by mouth daily as needed for mild pain. Take extra strength tylenol 2 times daily    [provider]  aspirin EC 81 MG tablet Take 1 tablet (81 mg total) by mouth daily for 21 days. 08/03/22 08/24/22  Wouk, Noah Bedford, MD  BIOTIN PO Take 1 capsule by mouth daily.    [provider]  carbidopa-levodopa (SINEMET IR) 25-100 MG tablet Take 2 at 7am/2 at 11am/1 at 3pm Patient taking differently: Take 1-2 tablets by mouth See admin instructions. Take 2 tablets by mouth at 0700, 2 tablets at 1100, and 1 tablet at 1500. 07/21/22   Tat, Rebecca S, DO  Cholecalciferol (VITAMIN D) 2000 UNITS CAPS Take 1 capsule by mouth daily.    [provider]  clopidogrel (PLAVIX) 75 MG   tablet Take 1 tablet (75 mg total) by mouth daily. 08/04/22   Wouk, Noah Bedford, MD  hydrALAZINE (APRESOLINE) 25 MG tablet Take 25 mg by mouth daily. 07/29/22   [provider]  hydroxyurea (HYDREA) 500 MG capsule TAKE ONE CAPSULE BY MOUTH THREE TIMES A WEEK. MAY take with food. Patient taking differently: Take 500 mg by mouth See admin instructions. Take 500 mg (1 capsule) by mouth three times a week. 07/17/22   Gudena, Vinay, MD  levothyroxine (SYNTHROID, LEVOTHROID) 88 MCG tablet Take 88 mcg by mouth daily.    [provider]  losartan (COZAAR) 25 MG tablet Take 12.5 mg by mouth daily. Patient not taking: Reported on 08/03/2022 04/13/22   [provider]  nitroGLYCERIN (NITROSTAT) 0.4 MG SL tablet Place 1 tablet (0.4 mg total) under the tongue every 5 (five) minutes as needed for chest pain. 03/02/19   Harding, David W, MD  Probiotic Product (PROBIOTIC PO) Take 1 tablet by mouth daily.    [provider]  rosuvastatin (CRESTOR) 10 MG tablet Take 1 tablet (10 mg total) by mouth daily. 08/04/22   Wouk, Noah Bedford, MD    Scheduled Meds:  sodium chloride   Intravenous Once   Continuous Infusions:  pantoprazole (PROTONIX) IV 80 mg (08/13/22 1404)   PRN Meds:.  Allergies as of 08/13/2022 - Review Complete 08/13/2022  Allergen Reaction Noted   Iodine Shortness Of Breath and Other (See Comments) 03/05/2011   Prednisone  12/30/2015   Shellfish allergy Anaphylaxis and Other (See Comments) 03/05/2011   Covid-19 mrna vacc (moderna) Other (See Comments) 10/03/2021   Gabapentin Other (See Comments) 10/03/2021   Meloxicam Other (See Comments) 10/03/2021   Other Other (See Comments) 10/03/2021   Topiramate Other (See Comments) 10/03/2021   Zoloft [sertraline] Other (See Comments) 10/03/2021    Family History  Problem Relation Age of Onset   Skin cancer Mother    Gallstones Brother    Gallstones Brother    Pulmonary embolism Maternal Aunt     Social History   Socioeconomic History   Marital status: Widowed    Spouse name: Not on file   Number of children: Not on file   Years of education: Not on file   Highest education level: Not on file  Occupational History   Occupation: retired    Comment: sales associate [employer]  Tobacco Use   Smoking status: Never   Smokeless tobacco: Never  Vaping Use   Vaping Use: Never used  Substance and Sexual Activity   Alcohol use: No   Drug use: No    Comment: cbd oil roll on   Sexual activity: Not Currently  Other Topics  Concern   Not on file  Social History Narrative   Widowed.  Lives home alone.  Retired.  12 th grade education.     \No routine exercise.   Long-term exposure to passive smoke.      In addition to her true allergies, she had a syncopal episode after starting Zoloft.  She is allergic to feathers (down).  Meloxicam causes upset stomach.  Prednisone makes her face red and hot.  Topamax causes diarrhea and gabapentin causes lightheadedness.   Right handed   Live alone in a one story home   retired   Social Determinants of Health   Financial Resource Strain: Not on file  Food Insecurity: No Food Insecurity (08/03/2022)   Hunger Vital Sign    Worried About Running Out of Food in the Last Year:   Never true    Ran Out of Food in the Last Year: Never true  Transportation Needs: No Transportation Needs (08/03/2022)   PRAPARE - Transportation    Lack of Transportation (Medical): No    Lack of Transportation (Non-Medical): No  Physical Activity: Not on file  Stress: Not on file  Social Connections: Not on file  Intimate Partner Violence: Not At Risk (08/03/2022)   Humiliation, Afraid, Rape, and Kick questionnaire    Fear of Current or Ex-Partner: No    Emotionally Abused: No    Physically Abused: No    Sexually Abused: No    Review of Systems: All negative except as stated above in HPI.  Physical Exam: Vital signs: Vitals:   08/13/22 1400 08/13/22 1415  BP: 134/62 (!) 146/66  Pulse:  (!) 101  Resp: (!) 26 20  Temp:    SpO2:  100%     General:   Laying flat in the bed with a neck collar in the place, looks somewhat uncomfortable Lungs: No visible respiratory distress Heart:  Regular rate and rhythm; no murmurs, clicks, rubs,  or gallops. Abdomen: Soft, nontender, nondistended, bowel sound present, no peritoneal signs Somewhat anxious Alert and oriented x 3 Rectal:  Deferred  GI:  Lab Results: Recent Labs    08/13/22 1256  WBC 33.2*  HGB 7.1*  HCT 23.3*  PLT 848*    BMET Recent Labs    08/13/22 1256  NA 142  K 4.6  CL 109  CO2 17*  GLUCOSE 184*  BUN 100*  CREATININE 1.33*  CALCIUM 8.6*   LFT Recent Labs    08/13/22 1256  PROT 5.3*  ALBUMIN 3.3*  AST 20  ALT 15  ALKPHOS 30*  BILITOT 0.5   PT/INR Recent Labs    08/13/22 1256  LABPROT 17.9*  INR 1.5*     Studies/Results: No results found.  Impression/Plan: -Melena for at least 3 days in setting of aspirin and Plavix use.  Likely from peptic ulcer disease. -Acute blood loss anemia -Recent CVA earlier this month.  Currently on aspirin and Plavix.  Plan was to keep her on dual antiplatelet therapy for 3 weeks followed by Plavix monotherapy.  Recommendations -------------------------- -Recommend supportive care for now with blood transfusion.  Patient's hemoglobin likely would be lower than 7.1 as he appears to be hemoconcentrated. -Continue IV Protonix twice daily -Tentative plan for EGD in the next few days.  Last dose of Plavix was yesterday morning -Okay to have full liquid diet.  Monitor H&H.  GI will follow.   LOS: 0 days   Ceejay Kegley  MD, FACP 08/13/2022, 2:24 PM  Contact #  [phone]  

## 2022-08-13 NOTE — H&P (Signed)
History and Physical    Patient: Deanna Hill MWU:[id number] DOB: 28-Jan-1941 DOA: 08/13/2022 DOS: the patient was seen and examined on 08/13/2022 PCP: Orpha Bur, MD  Patient coming from: Home  Chief Complaint:  Chief Complaint  Patient presents with   Fall   HPI: Deanna Hill is a 82 y.o. female with medical history significant for but not limited to anxiety, CAD, history of essential tremor and Parkinson's disease, GERD, hypertension, hyperlipidemia, hypothyroidism history of senile calcific aortic valve sclerosis as well as thrombocytosis and other comorbidities who was recently hospitalized and discharged 10 days ago for hypertensive emergency with headache malaise and was found to have a subacute right corpus callosum as well as remote age left subcortical lacunar infarct likely from small vessel disease.  She was stabilized and placed on dual antiplatelet therapy with Plavix and aspirin and subsequently since that time she is noted to have generalized weakness and melanotic stools.  She presented to the ED from home for frequent falls and generalized weakness and she fell today but did not lose consciousness.  Patient independently and when family checked on her she was found on the floor and was noted to have recently dark stools but denied any focal weakness or headaches or back pain.  History is limited from the patient given that she is a poor historian but she did answer her orientation questions appropriately.  She was able to tell me that her GI doctor is Dr. Ewing Schlein.  Since her last hospitalization she has been more fatigued and has been falling and had an unwitnessed fall today.  Patient was also complained of some abdominal discomfort with black dark stools that worsened in the last 3 days.  Patient denies chest pain or shortness of breath.  She denies any nausea or vomiting or burning or discomfort in her urine.  She does complain of some mid epigastric pain as well as some  right shoulder pain.  I spoke with the patient's daughter as well who notes that her mother has progressively had a decline in her health status.  Given her findings and FOBT being positive TR[facility] was asked to admit this patient for suspected upper GI bleeding and GI was consulted   Review of Systems: As mentioned in the history of present illness. All other systems reviewed and are negative. Past Medical History:  Diagnosis Date   Anxiety    Coronary artery disease, non-occlusive 05/2013   Cath for Abn Myoview => mild CAD, 30% pCx. Otw normal Coronaries.  Normlal EF.   Essential tremor 06/19/2015   GERD (gastroesophageal reflux disease)    Hyperlipidemia    Hypertension    Hypothyroidism    IBS (irritable bowel syndrome)    Orthostatic hypotension 05/22/2016   Parkinson's disease    Senile calcific aortic valve sclerosis 04/2016   2D echo showed aortic sclerosis but no stenosis.  Normal wall motion.  GR 2 DD   Thrombocytosis 03/11/2011   Past Surgical History:  Procedure Laterality Date   ABDOMINAL HYSTERECTOMY  1979   CAROTID DOPPLERS  01/2017   normal Carotid, Sub-Clavian & Vertebral Arteries.   CATARACT EXTRACTION Bilateral 2015   Dr. Hollie Salk, LAPAROSCOPIC  2001   LEFT HEART CATHETERIZATION WITH CORONARY ANGIOGRAM N/A 06/08/2013   Procedure: LEFT HEART CATHETERIZATION WITH CORONARY ANGIOGRAM;  Surgeon: Othella Boyer, MD;  Location: [facility] CATH LAB;  Service: Cardiovascular: Dr. Donnie Aho: Mild CAD, 30%pCx.  Otherwise nonobstructive disease.  Normal LV function.   TOE  SURGERY Left 1994   TONSILLECTOMY  1952   TRANSTHORACIC ECHOCARDIOGRAM  02/'18, 11/'18   a) Normal EF of 60-65%.  Normal wall motion.  GR 2 DD.  Aortic sclerosis without stenosis;; b) Mod LVH. EF 60-65%. Ao Sclerosis w/o AS. Pseudonormal - Gr 2 DD.   Social History:  reports that she has never smoked. She has never used smokeless tobacco. She reports that she does not drink alcohol and does not use  drugs.  Allergies  Allergen Reactions   Iodine Shortness Of Breath and Other (See Comments)    Pt states "Deadly"   Prednisone     Facial flushing and swelling   Shellfish Allergy Anaphylaxis and Other (See Comments)    " I almost died once"    Covid-19 Mrna Vacc (Moderna) Other (See Comments)   Gabapentin Other (See Comments)   Meloxicam Other (See Comments)   Other Other (See Comments)   Topiramate Other (See Comments)   Zoloft [Sertraline] Other (See Comments)    Family History  Problem Relation Age of Onset   Skin cancer Mother    Gallstones Brother    Gallstones Brother    Pulmonary embolism Maternal Aunt     Prior to Admission medications   Medication Sig Start Date End Date Taking? Authorizing Provider  acetaminophen (TYLENOL) 325 MG tablet Take 650 mg by mouth daily as needed for mild pain. Take extra strength tylenol 2 times daily    [provider]  aspirin EC 81 MG tablet Take 1 tablet (81 mg total) by mouth daily for 21 days. 08/03/22 08/24/22  Wouk, Wilfred Curtis, MD  BIOTIN PO Take 1 capsule by mouth daily.    [provider]  carbidopa-levodopa (SINEMET IR) 25-100 MG tablet Take 2 at 7am/2 at 11am/1 at 3pm Patient taking differently: Take 1-2 tablets by mouth See admin instructions. Take 2 tablets by mouth at 0700, 2 tablets at 1100, and 1 tablet at 1500. 07/21/22   Tat, Octaviano Batty, DO  Cholecalciferol (VITAMIN D) 2000 UNITS CAPS Take 1 capsule by mouth daily.    [provider]  clopidogrel (PLAVIX) 75 MG tablet Take 1 tablet (75 mg total) by mouth daily. 08/04/22   Wouk, Wilfred Curtis, MD  hydrALAZINE (APRESOLINE) 25 MG tablet Take 25 mg by mouth daily. 07/29/22   [provider]  hydroxyurea (HYDREA) 500 MG capsule TAKE ONE CAPSULE BY MOUTH THREE TIMES A WEEK. MAY take with food. Patient taking differently: Take 500 mg by mouth See admin instructions. Take 500 mg (1 capsule) by mouth three times a week. 07/17/22   Serena Croissant, MD   levothyroxine (SYNTHROID, LEVOTHROID) 88 MCG tablet Take 88 mcg by mouth daily.    [provider]  losartan (COZAAR) 25 MG tablet Take 12.5 mg by mouth daily. Patient not taking: Reported on 08/03/2022 04/13/22   [provider]  nitroGLYCERIN (NITROSTAT) 0.4 MG SL tablet Place 1 tablet (0.4 mg total) under the tongue every 5 (five) minutes as needed for chest pain. 03/02/19   Marykay Lex, MD  Probiotic Product (PROBIOTIC PO) Take 1 tablet by mouth daily.    [provider]  rosuvastatin (CRESTOR) 10 MG tablet Take 1 tablet (10 mg total) by mouth daily. 08/04/22   Kathrynn Running, MD   Physical Exam: Vitals:   08/13/22 1330 08/13/22 1345 08/13/22 1400 08/13/22 1415  BP: 129/66 (!) 130/59 134/62 (!) 146/66  Pulse:    (!) 101  Resp: 16 15 (!) 26 20  Temp:      TempSrc:      SpO2:    100%  Weight:      Height:       Examination: Physical Exam:  Constitutional: Thin elderly Caucasian female appears a little uncomfortable Respiratory: Diminished to auscultation bilaterally, no wheezing, rales, rhonchi or crackles. Normal respiratory effort and patient is not tachypenic. No accessory muscle use.  Unlabored breathing Cardiovascular: RRR, no murmurs / rubs / gallops. S1 and S2 auscultated. No extremity edema.  Abdomen: Soft, tender to palpate in the mid epigastric area, non-distended.  Bowel sounds positive.  GU: Deferred. Musculoskeletal: No clubbing / cyanosis of digits/nails. No joint deformity upper and lower extremities but has pain on right shoulder palpation. Skin: No rashes, lesions, ulcers on limited skin evaluation. No induration; Warm and dry.  Neurologic: CN 2-12 grossly intact with no focal deficits. Romberg sign cerebellar reflexes not assessed.  Psychiatric: Impaired judgment and insight.  She is awake and alert but not fully oriented  Data Reviewed:  Patient had a CMP done which showed a sodium of 142, potassium of 4.6, chloride level of  109, CO2 17, glucose level 184, BUN of 100, creatinine of 1.33, calcium of 8.6, anion gap of 16, alk phos of 30, albumin of 3.3, AST 20, ALT 15, total protein 5.3, total bilirubin of 0.5, with an estimated GFR of 40  Patient had an iron panel and anemia panel done which showed an iron level of 71, UIBC of 213, TIBC of 284, saturation ratios of 25%, ferritin level 50, folate level 19.6 and a vitamin B12 of 302  Patient also had a CBC done which showed a WBC of 33.2, hemoglobin of 7.1, hematocrit of 20.3, MCV of 100.9 and a platelet count of 848  FOBT was positive and patient had a chest x-ray done which showed "Right upper quadrant surgical clips. Normal lung volumes. No focal consolidations. No pleural effusion or pneumothorax. The heart size and mediastinal contours are within normal limits. No radiographic finding of acute displaced fracture"  CT cervical spine without contrast and CT head were done and showed "IMPRESSION: 1. No acute intracranial abnormality.  2. No acute cervical spine fracture or traumatic malalignment. 3. Old subcortical infarcts in the left frontal and parietal lobes."  EKG was done and independently reviewed which showed a QTc of 470 and a sinus tachycardia with a rate of 108 nonspecific ST changes and left axis deviation  Assessment and Plan: No notes have been filed under this hospital service. Service: Hospitalist  Suspected upper GI bleeding with melena -Given a bolus of pantoprazole and will place on a drip -Avoid NSAIDs and hold aspirin and Plavix -Will need to large-bore Ivs -Getting 2 units of PRBCs and may need additional blood; transfuse for hemoglobin less than 7 or symptomatic anemia -GI consulted -Cycle H&H's every 6h -FOBT was positive -Further care per GI and appreciate further recommendations  Essential Hypertension -Antihypertensives given GI bleeding -Monitor blood pressures per protocol and placed on IV hydralazine 10 mg Q6 as needed for  systolic blood pressure > 180 -Last blood pressure reading was 159/72  Generalized weakness and deconditioning with fatigue and malaise -In the setting of above -Also initiated on IV fluid with LR at 75 MLS per hour -Getting 2 units of PRBCs and will need PT OT to further evaluate and treat -Patient also has Parkinson's  Parkinson's disease -Continue with Sinemet  Right shoulder pain -In the setting of fall -Check x-ray  Leukocytosis Essential Thrombocytosis -  Likely reactive in the setting of GI Bleeding -WBC and Platelet Count Trend: Recent Labs  Lab 08/02/22 1205 08/03/22 0420 08/13/22 1256  WBC 11.8* 13.5* 33.2*  PLT 670* 682* 848*  -Check CBC in the AM -Currently takes hydroxyurea every Monday Wednesday Friday given the recent change back in February back to 3 times weekly  ABLA likely in the setting of Upper GI Bleeding  -Hgb/Hct Trend: -INR was 1.5 Recent Labs  Lab 08/02/22 1205 08/03/22 0420 08/13/22 1256  HGB 13.8 12.9 7.1*  HCT 44.2 41.6 23.3*  MCV 97.6 97.7 100.9*  -Anemia Panel was checked prior to Blood Transfusions showed an iron level of 71, UIBC of 213, TIBC of 284, saturation ratios of 25%, ferritin level 50, vitamin B12 302 -He is being typed and screened and transfused 2 units of PRBCs in the ED -Continue to monitor for signs and symptoms of bleeding; she has been having some melena and FOBT has been positive -Repeat CBC in the a.m. and monitor H&H's every 6h  CAD -Hold ASA and if necessary will place on sublingual nitroglycerin -Continue rosuvastatin but hold losartan  Hypothyroidism -Check TSH and continue levothyroxine 88 mcg p.o. daily  AKI Elevated Anion Gap Metabolic Acidosis -BUN/Cr Trend: Recent Labs  Lab 08/02/22 1205 08/03/22 0420 08/13/22 1256  BUN 21 16 100*  CREATININE 0.76 0.94 1.33*  -Received a 1 Liter Bolus and getting 2 units of pRBC's -Avoid Nephrotoxic Medications, Contrast Dyes, Hypotension and Dehydration to  Ensure Adequate Renal Perfusion and will need to Renally Adjust Meds -Continue to Monitor and Trend Renal Function carefully and repeat CMP in the AM   Recent Subacute right corpus callosum as well as remote age left subcortical lacunar infarcts in the setting of Chronic Small Vessel Disease  -Underwent Stroke Workup last hospitalization 10 days ago -Hold ASA and Plavix that was recommended for 3 weeks  GERD/GI prophylaxis -Continue with pantoprazole drip as above  Hypoalbuminemia -Patient's Albumin Trend: Recent Labs  Lab 08/13/22 1256  ALBUMIN 3.3*  -Continue to Monitor and Trend and repeat CMP in the AM  Advance Care Planning:   Code Status: Prior NOW DNR (confirmed by patient and Daughter)  Consults: Gastroenterology   Family Communication: Called and spoke with the patient's daughter over the telephone  Severity of Illness: The appropriate patient status for this patient is INPATIENT. Inpatient status is judged to be reasonable and necessary in order to provide the required intensity of service to ensure the patient's safety. The patient's presenting symptoms, physical exam findings, and initial radiographic and laboratory data in the context of their chronic comorbidities is felt to place them at high risk for further clinical deterioration. Furthermore, it is not anticipated that the patient will be medically stable for discharge from the hospital within 2 midnights of admission.   * I certify that at the point of admission it is my clinical judgment that the patient will require inpatient hospital care spanning beyond 2 midnights from the point of admission due to high intensity of service, high risk for further deterioration and high frequency of surveillance required.*  Author: Marguerita Merles, DO [facility] 08/13/2022 2:35 PM  For on call review [email or web address].

## 2022-08-13 NOTE — ED Provider Notes (Signed)
[place] EMERGENCY DEPARTMENT AT [facility] Provider Note   CSN: [id number] Arrival date & time: 08/13/22  1244     History  Chief Complaint  Patient presents with   Deanna Hill is a 82 y.o. female.  Pt with general weakness and recent frequent falls. Pt fell today, denies loc. When family had checked on was on floor. Pt notes recent dark stools. No focal or unilateral weakness. Pt is on plavix. No headache. No neck/back pain. No chest pain or sob. No abd pain or nvd. No dysuria. No extremity pain. Pt limited historian - level 5 caveat.  The history is provided by the patient, medical records and the EMS personnel. The history is limited by the condition of the patient.  Fall Pertinent negatives include no chest pain, no abdominal pain, no headaches and no shortness of breath.       Home Medications Prior to Admission medications   Medication Sig Start Date End Date Taking? Authorizing Provider  acetaminophen (TYLENOL) 325 MG tablet Take 650 mg by mouth daily as needed for mild pain. Take extra strength tylenol 2 times daily    [provider]  aspirin EC 81 MG tablet Take 1 tablet (81 mg total) by mouth daily for 21 days. 08/03/22 08/24/22  Wouk, Wilfred Curtis, MD  BIOTIN PO Take 1 capsule by mouth daily.    [provider]  carbidopa-levodopa (SINEMET IR) 25-100 MG tablet Take 2 at 7am/2 at 11am/1 at 3pm Patient taking differently: Take 1-2 tablets by mouth See admin instructions. Take 2 tablets by mouth at 0700, 2 tablets at 1100, and 1 tablet at 1500. 07/21/22   Tat, Octaviano Batty, DO  Cholecalciferol (VITAMIN D) 2000 UNITS CAPS Take 1 capsule by mouth daily.    [provider]  clopidogrel (PLAVIX) 75 MG tablet Take 1 tablet (75 mg total) by mouth daily. 08/04/22   Wouk, Wilfred Curtis, MD  hydrALAZINE (APRESOLINE) 25 MG tablet Take 25 mg by mouth daily. 07/29/22   [provider]  hydroxyurea (HYDREA) 500 MG capsule TAKE ONE  CAPSULE BY MOUTH THREE TIMES A WEEK. MAY take with food. Patient taking differently: Take 500 mg by mouth See admin instructions. Take 500 mg (1 capsule) by mouth three times a week. 07/17/22   Serena Croissant, MD  levothyroxine (SYNTHROID, LEVOTHROID) 88 MCG tablet Take 88 mcg by mouth daily.    [provider]  losartan (COZAAR) 25 MG tablet Take 12.5 mg by mouth daily. Patient not taking: Reported on 08/03/2022 04/13/22   [provider]  nitroGLYCERIN (NITROSTAT) 0.4 MG SL tablet Place 1 tablet (0.4 mg total) under the tongue every 5 (five) minutes as needed for chest pain. 03/02/19   Marykay Lex, MD  Probiotic Product (PROBIOTIC PO) Take 1 tablet by mouth daily.    [provider]  rosuvastatin (CRESTOR) 10 MG tablet Take 1 tablet (10 mg total) by mouth daily. 08/04/22   Wouk, Wilfred Curtis, MD      Allergies    Iodine, Prednisone, Shellfish allergy, Covid-19 mrna vacc (moderna), Gabapentin, Meloxicam, Other, Topiramate, and Zoloft [sertraline]    Review of Systems   Review of Systems  Constitutional:  Negative for fever.  HENT:  Negative for sore throat.   Eyes:  Negative for redness.  Respiratory:  Negative for cough and shortness of breath.   Cardiovascular:  Negative for chest pain.  Gastrointestinal:  Positive for blood in stool. Negative for abdominal pain  and vomiting.  Genitourinary:  Negative for dysuria and flank pain.  Musculoskeletal:  Negative for back pain and neck pain.  Skin:  Negative for rash.  Neurological:  Negative for headaches.    Physical Exam Updated Vital Signs BP (!) 140/68   Pulse (!) 102   Temp 97.6 F (36.4 C) (Oral)   Resp (!) 23   Ht 1.753 m (5\' 9" )   Wt 58.5 kg   SpO2 100%   BMI 19.05 kg/m  Physical Exam Vitals and nursing note reviewed.  Constitutional:      Appearance: Normal appearance. She is well-developed.  HENT:     Head:     Comments: Tenderness scalp.     Nose: Nose normal.     Mouth/Throat:      Mouth: Mucous membranes are moist.  Eyes:     General: No scleral icterus.    Pupils: Pupils are equal, round, and reactive to light.     Comments: Conj pale.   Neck:     Trachea: No tracheal deviation.  Cardiovascular:     Rate and Rhythm: Normal rate and regular rhythm.     Pulses: Normal pulses.     Heart sounds: Normal heart sounds. No murmur heard.    No friction rub. No gallop.  Pulmonary:     Effort: Pulmonary effort is normal. No respiratory distress.     Breath sounds: Normal breath sounds.  Abdominal:     General: Bowel sounds are normal. There is no distension.     Palpations: Abdomen is soft.     Tenderness: There is no abdominal tenderness. There is no guarding.  Genitourinary:    Comments: No cva tenderness. Black stool, heme pos.  Musculoskeletal:        General: No swelling.     Cervical back: Normal range of motion and neck supple. No rigidity. No muscular tenderness.     Comments: Mid cervical tenderness, otherwise, CTLS spine, non tender, aligned, no step off. Good rom bilateral extremities without pain or focal bony tenderness.  Skin:    General: Skin is warm and dry.     Coloration: Skin is pale.     Findings: No rash.  Neurological:     Mental Status: She is alert.     Comments: Alert, speech normal. GCS 15. Motor/sens grossly intact bil.   Psychiatric:        Mood and Affect: Mood normal.     ED Results / Procedures / Treatments   Labs (all labs ordered are listed, but only abnormal results are displayed) Results for orders placed or performed during the hospital encounter of 08/13/22  CBC  Result Value Ref Range   WBC 33.2 (H) 4.0 - 10.5 K/uL   RBC 2.31 (L) 3.87 - 5.11 MIL/uL   Hemoglobin 7.1 (L) 12.0 - 15.0 g/dL   HCT 16.1 (L) 09.6 - 04.5 %   MCV 100.9 (H) 80.0 - 100.0 fL   MCH 30.7 26.0 - 34.0 pg   MCHC 30.5 30.0 - 36.0 g/dL   RDW 40.9 81.1 - 91.4 %   Platelets 848 (H) 150 - 400 K/uL   nRBC 0.0 0.0 - 0.2 %  Comprehensive metabolic panel   Result Value Ref Range   Sodium 142 135 - 145 mmol/L   Potassium 4.6 3.5 - 5.1 mmol/L   Chloride 109 98 - 111 mmol/L   CO2 17 (L) 22 - 32 mmol/L   Glucose, Bld 184 (H) 70 - 99 mg/dL  BUN 100 (H) 8 - 23 mg/dL   Creatinine, Ser 1.61 (H) 0.44 - 1.00 mg/dL   Calcium 8.6 (L) 8.9 - 10.3 mg/dL   Total Protein 5.3 (L) 6.5 - 8.1 g/dL   Albumin 3.3 (L) 3.5 - 5.0 g/dL   AST 20 15 - 41 U/L   ALT 15 0 - 44 U/L   Alkaline Phosphatase 30 (L) 38 - 126 U/L   Total Bilirubin 0.5 0.3 - 1.2 mg/dL   GFR, Estimated 40 (L) >60 mL/min   Anion gap 16 (H) 5 - 15  Protime-INR  Result Value Ref Range   Prothrombin Time 17.9 (H) 11.4 - 15.2 seconds   INR 1.5 (H) 0.8 - 1.2  Iron and TIBC  Result Value Ref Range   Iron 71 28 - 170 ug/dL   TIBC 096 045 - 409 ug/dL   Saturation Ratios 25 10.4 - 31.8 %   UIBC 213 ug/dL  Ferritin  Result Value Ref Range   Ferritin 50 11 - 307 ng/mL  Reticulocytes  Result Value Ref Range   Retic Ct Pct 1.7 0.4 - 3.1 %   RBC. 2.28 (L) 3.87 - 5.11 MIL/uL   Retic Count, Absolute 38.5 19.0 - 186.0 K/uL   Immature Retic Fract 18.2 (H) 2.3 - 15.9 %  POC occult blood, ED Provider will collect  Result Value Ref Range   Fecal Occult Bld POSITIVE (A) NEGATIVE  Type and screen  Result Value Ref Range   ABO/RH(D) A NEG    Antibody Screen NEG    Sample Expiration 08/16/2022,2359    Unit Number [id number]    Blood Component Type RED CELLS,LR    Unit division 00    Status of Unit ISSUED    Unit tag comment EMERGENCY RELEASE    Transfusion Status OK TO TRANSFUSE    Crossmatch Result COMPATIBLE    Unit Number [id number]    Blood Component Type RED CELLS,LR    Unit division 00    Status of Unit ISSUED    Unit tag comment EMERGENCY RELEASE    Transfusion Status OK TO TRANSFUSE    Crossmatch Result COMPATIBLE   ABO/Rh  Result Value Ref Range   ABO/RH(D)      A NEG Performed at [facility], [address], [place], [place] [address]   Prepare RBC  Result  Value Ref Range   Order Confirmation      ORDER PROCESSED BY BLOOD BANK Performed at [facility], [address] [place], [place] [address]   BPAM [facility]  Result Value Ref Range   ISSUE DATE / TIME 010272536644    Blood Product Unit Number [id number]    Unit Type and Rh 5100    Blood Product Expiration Date [id number]    ISSUE DATE / TIME 166063016010    Blood Product Unit Number [id number]    Unit Type and Rh 5100    Blood Product Expiration Date [id number]    CT Head Wo Contrast  Result Date: 08/13/2022 CLINICAL DATA:  Head trauma, minor (Age >= 65y); Neck trauma (Age >= 65y). Unwitnessed fall. EXAM: CT HEAD WITHOUT CONTRAST CT CERVICAL SPINE WITHOUT CONTRAST TECHNIQUE: Multidetector CT imaging of the head and cervical spine was performed following the standard protocol without intravenous contrast. Multiplanar CT image reconstructions of the cervical spine were also generated. RADIATION DOSE REDUCTION: This exam was performed according to the departmental dose-optimization program which includes automated exposure control, adjustment of the mA and/or kV according  to patient size and/or use of iterative reconstruction technique. COMPARISON:  Head CT 08/02/2022.  MRI thoracic spine 12/15/2020. FINDINGS: CT HEAD FINDINGS Brain: No acute hemorrhage. Old subcortical infarcts in the left frontal and parietal lobes. Gray-white differentiation is otherwise preserved. No hydrocephalus or extra-axial collection. No mass effect or midline shift. Vascular: No hyperdense vessel or unexpected calcification. Skull: No calvarial fracture or suspicious bone lesion. Skull base is unremarkable. Sinuses/Orbits: Unremarkable. Other: None. CT CERVICAL SPINE FINDINGS Alignment: 3 mm degenerative anterolisthesis of C4 on C5. No traumatic malalignment. Skull base and vertebrae: Unchanged old superior endplate compression of T3. No acute fracture. Soft tissues and spinal canal: No prevertebral fluid or  swelling. No visible canal hematoma. Disc levels: Mild cervical spondylosis without high-grade spinal canal stenosis. Upper chest: Unremarkable. Other: Atherosclerotic calcifications of the carotid bulbs. IMPRESSION: 1. No acute intracranial abnormality. 2. No acute cervical spine fracture or traumatic malalignment. 3. Old subcortical infarcts in the left frontal and parietal lobes. Electronically Signed   By: Orvan Falconer M.D.   On: 08/13/2022 15:21   CT Cervical Spine Wo Contrast  Result Date: 08/13/2022 CLINICAL DATA:  Head trauma, minor (Age >= 65y); Neck trauma (Age >= 65y). Unwitnessed fall. EXAM: CT HEAD WITHOUT CONTRAST CT CERVICAL SPINE WITHOUT CONTRAST TECHNIQUE: Multidetector CT imaging of the head and cervical spine was performed following the standard protocol without intravenous contrast. Multiplanar CT image reconstructions of the cervical spine were also generated. RADIATION DOSE REDUCTION: This exam was performed according to the departmental dose-optimization program which includes automated exposure control, adjustment of the mA and/or kV according to patient size and/or use of iterative reconstruction technique. COMPARISON:  Head CT 08/02/2022.  MRI thoracic spine 12/15/2020. FINDINGS: CT HEAD FINDINGS Brain: No acute hemorrhage. Old subcortical infarcts in the left frontal and parietal lobes. Gray-white differentiation is otherwise preserved. No hydrocephalus or extra-axial collection. No mass effect or midline shift. Vascular: No hyperdense vessel or unexpected calcification. Skull: No calvarial fracture or suspicious bone lesion. Skull base is unremarkable. Sinuses/Orbits: Unremarkable. Other: None. CT CERVICAL SPINE FINDINGS Alignment: 3 mm degenerative anterolisthesis of C4 on C5. No traumatic malalignment. Skull base and vertebrae: Unchanged old superior endplate compression of T3. No acute fracture. Soft tissues and spinal canal: No prevertebral fluid or swelling. No visible canal  hematoma. Disc levels: Mild cervical spondylosis without high-grade spinal canal stenosis. Upper chest: Unremarkable. Other: Atherosclerotic calcifications of the carotid bulbs. IMPRESSION: 1. No acute intracranial abnormality. 2. No acute cervical spine fracture or traumatic malalignment. 3. Old subcortical infarcts in the left frontal and parietal lobes. Electronically Signed   By: Orvan Falconer M.D.   On: 08/13/2022 15:21   DG Chest Port 1 View  Result Date: 08/13/2022 CLINICAL DATA:  Weakness and unwitnessed fall EXAM: PORTABLE CHEST 1 VIEW COMPARISON:  Chest radiograph dated 08/02/2022 FINDINGS: Right upper quadrant surgical clips. Normal lung volumes. No focal consolidations. No pleural effusion or pneumothorax. The heart size and mediastinal contours are within normal limits. No radiographic finding of acute displaced fracture. IMPRESSION: No radiographic finding of acute displaced fracture. Electronically Signed   By: Agustin Cree M.D.   On: 08/13/2022 14:22   ECHOCARDIOGRAM COMPLETE  Result Date: 08/03/2022    ECHOCARDIOGRAM REPORT   Patient Name:   AMIKA REPPUCCI [facility] Date of Exam: 08/03/2022 Medical Rec #:  [id number]         Height:       69.0 in Accession #:    [id number]  Weight:       123.4 lb Date of Birth:  January 29, 1941         BSA:          1.683 m Patient Age:    82 years          BP:           168/84 mmHg Patient Gender: F                 HR:           87 bpm. Exam Location:  Inpatient Procedure: 2D Echo, Cardiac Doppler and Color Doppler Indications:    Stroke  History:        Patient has prior history of Echocardiogram examinations, most                 recent 02/05/2017. Stroke, Signs/Symptoms:Syncope; Risk                 Factors:Dyslipidemia and Hypertension. Thrombocytosis,                 Hypothyroidism.  Sonographer:    Milbert Coulter Referring Phys: [id number] PRAMOD S SETHI IMPRESSIONS  1. Left ventricular ejection fraction, by estimation, is 65 to 70%. The left ventricle has normal  function. The left ventricle has no regional wall motion abnormalities. There is moderate concentric left ventricular hypertrophy. Left ventricular diastolic parameters are indeterminate.  2. Right ventricular systolic function is normal. The right ventricular size is normal.  3. The mitral valve is degenerative. Trivial mitral valve regurgitation. No evidence of mitral stenosis. Moderate mitral annular calcification.  4. The aortic valve is tricuspid. There is mild calcification of the aortic valve. There is mild thickening of the aortic valve. Aortic valve regurgitation is not visualized. Aortic valve sclerosis is present, with no evidence of aortic valve stenosis.  5. The inferior vena cava is normal in size with <50% respiratory variability, suggesting right atrial pressure of 8 mmHg. Comparison(s): No significant change from prior study. Conclusion(s)/Recommendation(s): Normal biventricular function without evidence of hemodynamically significant valvular heart disease. No intracardiac source of embolism detected on this transthoracic study. Consider a transesophageal echocardiogram to exclude cardiac source of embolism if clinically indicated. FINDINGS  Left Ventricle: Left ventricular ejection fraction, by estimation, is 65 to 70%. The left ventricle has normal function. The left ventricle has no regional wall motion abnormalities. The left ventricular internal cavity size was normal in size. There is  moderate concentric left ventricular hypertrophy. Left ventricular diastolic parameters are indeterminate. Right Ventricle: The right ventricular size is normal. No increase in right ventricular wall thickness. Right ventricular systolic function is normal. Left Atrium: Left atrial size was normal in size. Right Atrium: Right atrial size was normal in size. Pericardium: There is no evidence of pericardial effusion. Mitral Valve: The mitral valve is degenerative in appearance. There is mild thickening of the  mitral valve leaflet(s). There is mild calcification of the mitral valve leaflet(s). Moderate mitral annular calcification. Trivial mitral valve regurgitation. No evidence of mitral valve stenosis. Tricuspid Valve: The tricuspid valve is normal in structure. Tricuspid valve regurgitation is trivial. No evidence of tricuspid stenosis. Aortic Valve: The aortic valve is tricuspid. There is mild calcification of the aortic valve. There is mild thickening of the aortic valve. Aortic valve regurgitation is not visualized. Aortic valve sclerosis is present, with no evidence of aortic valve stenosis. Aortic valve mean gradient measures 4.0 mmHg. Aortic valve peak gradient measures 6.7 mmHg. Aortic valve area, by  VTI measures 2.43 cm. Pulmonic Valve: The pulmonic valve was not well visualized. Pulmonic valve regurgitation is trivial. No evidence of pulmonic stenosis. Aorta: The aortic root, ascending aorta, aortic arch and descending aorta are all structurally normal, with no evidence of dilitation or obstruction. Venous: The inferior vena cava is normal in size with less than 50% respiratory variability, suggesting right atrial pressure of 8 mmHg. IAS/Shunts: The atrial septum is grossly normal.  LEFT VENTRICLE PLAX 2D LVIDd:         3.70 cm     Diastology LVIDs:         1.90 cm     LV e' medial:    6.09 cm/s LV PW:         1.20 cm     LV E/e' medial:  15.6 LV IVS:        1.40 cm     LV e' lateral:   6.31 cm/s LVOT diam:     2.00 cm     LV E/e' lateral: 15.0 LV SV:         62 LV SV Index:   37 LVOT Area:     3.14 cm  LV Volumes (MOD) LV vol d, MOD A2C: 35.6 ml LV vol d, MOD A4C: 49.8 ml LV vol s, MOD A2C: 12.3 ml LV vol s, MOD A4C: 12.7 ml LV SV MOD A2C:     23.3 ml LV SV MOD A4C:     49.8 ml LV SV MOD BP:      30.4 ml RIGHT VENTRICLE RV Basal diam:  2.70 cm RV Mid diam:    2.20 cm RV S prime:     18.70 cm/s TAPSE (M-mode): 2.3 cm LEFT ATRIUM             Index        RIGHT ATRIUM           Index LA diam:        4.00 cm  2.38 cm/m   RA Area:     12.20 cm LA Vol (A2C):   35.9 ml 21.33 ml/m  RA Volume:   26.60 ml  15.81 ml/m LA Vol (A4C):   47.7 ml 28.35 ml/m LA Biplane Vol: 42.9 ml 25.49 ml/m  AORTIC VALVE AV Area (Vmax):    2.43 cm AV Area (Vmean):   2.34 cm AV Area (VTI):     2.43 cm AV Vmax:           129.00 cm/s AV Vmean:          87.100 cm/s AV VTI:            0.253 m AV Peak Grad:      6.7 mmHg AV Mean Grad:      4.0 mmHg LVOT Vmax:         99.60 cm/s LVOT Vmean:        65.000 cm/s LVOT VTI:          0.196 m LVOT/AV VTI ratio: 0.77  AORTA Ao Root diam: 3.40 cm Ao Asc diam:  3.10 cm MITRAL VALVE MV Area (PHT): 3.77 cm     SHUNTS MV Decel Time: 201 msec     Systemic VTI:  0.20 m MV E velocity: 94.90 cm/s   Systemic Diam: 2.00 cm MV A velocity: 107.00 cm/s MV E/A ratio:  0.89 Jodelle Red MD Electronically signed by Jodelle Red MD Signature Date/Time: 08/03/2022/3:55:03 PM    Final    CT ANGIO HEAD  NECK W WO CM  Result Date: 08/03/2022 CLINICAL DATA:  Stroke follow-up EXAM: CT ANGIOGRAPHY HEAD AND NECK WITH AND WITHOUT CONTRAST TECHNIQUE: Multidetector CT imaging of the head and neck was performed using the standard protocol during bolus administration of intravenous contrast. Multiplanar CT image reconstructions and MIPs were obtained to evaluate the vascular anatomy. Carotid stenosis measurements (when applicable) are obtained utilizing NASCET criteria, using the distal internal carotid diameter as the denominator. RADIATION DOSE REDUCTION: This exam was performed according to the departmental dose-optimization program which includes automated exposure control, adjustment of the mA and/or kV according to patient size and/or use of iterative reconstruction technique. CONTRAST:  75mL OMNIPAQUE IOHEXOL 350 MG/ML SOLN COMPARISON:  MRA Head/Neck 04/18/16 FINDINGS: CT HEAD FINDINGS Brain: Note that the presence of IV contrast limits the ability to assess for intracranial hemorrhage. Redemonstrated is a  parafalcine meningioma on the right, better assessed on recent contrast-enhanced brain MRI. No CT evidence of an acute cortical infarct. Redemonstrated is chronic infarct in the left frontal lobe. Vascular: See below Skull: Normal. Negative for fracture or focal lesion. Sinuses/Orbits: No middle ear or mastoid effusion. Polypoid mucosal thickening left maxillary sinus. Bilateral lens replacement. Orbits are otherwise unremarkable. Other: None. Review of the MIP images confirms the above findings CTA NECK FINDINGS Aortic arch: Standard branching. Imaged portion shows no evidence of aneurysm or dissection. No significant stenosis of the major arch vessel origins. Right carotid system: No evidence of dissection, stenosis (50% or greater), or occlusion. Left carotid system: No evidence of dissection, stenosis (50% or greater), or occlusion. Vertebral arteries: There is mild somewhat irregular narrowing of the V1 segment of the left vertebral artery, which can be seen in the setting of vertebral artery dissection (see screen shot). This is new from 2018. Skeleton: Negative. Other neck: Negative. Upper chest: Negative Review of the MIP images confirms the above findings CTA HEAD FINDINGS Anterior circulation: No significant stenosis, proximal occlusion, aneurysm, or vascular malformation. There is atherosclerotic narrowing in the bilateral MCA territories, with the area of greatest narrowing being the superior terminal branch of the left MCA (see screenshot), likely unchanged from 2018. Posterior circulation: No significant stenosis, proximal occlusion, aneurysm, or vascular malformation. Venous sinuses: Patent when accounting for timing of the contrast bolus. Anatomic variants: None Review of the MIP images confirms the above findings IMPRESSION: 1. Mild irregular narrowing of the V1 segment of the left vertebral artery could be seen in the setting of dissection. 2. No intracranial large vessel occlusion. 3. No  hemodynamically significant stenosis in the neck. 4. Multifocal atherosclerotic intracranial stenoses, greatest in the left MCA distribution, as above. Electronically Signed   By: Lorenza Cambridge M.D.   On: 08/03/2022 09:41   MR BRAIN W CONTRAST  Result Date: 08/03/2022 CLINICAL DATA:  Follow-up examination for possible demyelinating lesion. EXAM: MRI HEAD WITH CONTRAST TECHNIQUE: Multiplanar, multiecho pulse sequences of the brain and surrounding structures were obtained with intravenous contrast. CONTRAST:  5mL GADAVIST GADOBUTROL 1 MMOL/ML IV SOLN COMPARISON:  Prior MRI from 08/02/2022. FINDINGS: Brain: Postcontrast imaging of the brain demonstrates no appreciable enhancement about the cerebral white matter lesions to suggest active demyelination. Incidental DVA noted at the right parietal lobe. 11 mm right parafalcine meningioma present at the right parietal lobe without mass effect. No other abnormal or pathologic enhancement. Vascular: Normal intravascular enhancement seen throughout the major intracranial vasculature. Skull and upper cervical spine: Craniocervical junction within normal limits. No focal marrow replacing lesion. No scalp soft tissue abnormality. Sinuses/Orbits: Globes  and orbital soft tissues demonstrate no acute finding. Prior bilateral ocular lens replacement. Paranasal sinuses are largely clear. No significant mastoid effusion. Other: None. IMPRESSION: 1. No abnormal enhancement to suggest active demyelination. 2. 11 mm right parafalcine meningioma without mass effect. 3. Incidental DVA at the right parietal lobe. Electronically Signed   By: Rise Mu M.D.   On: 08/03/2022 00:46   MR BRAIN WO CONTRAST  Result Date: 08/02/2022 CLINICAL DATA:  Concern for PRES EXAM: MRI HEAD WITHOUT CONTRAST TECHNIQUE: Multiplanar, multiecho pulse sequences of the brain and surrounding structures were obtained without intravenous contrast. COMPARISON:  MRI head February 10, 2017. FINDINGS:  Brain: New focal area of T2 hyperintensity in the right aspect of the splenium of the corpus callosum with probable faint peripheral restricted diffusion. No other areas of restricted diffusion. Remote but interval white matter infarcts in the left frontal and parietal lobes. No evidence of acute hemorrhage, a mass lesion, midline shift or hydrocephalus. Vascular: Major arterial flow voids are maintained at the skull base. Skull and upper cervical spine: Normal marrow signal. Sinuses/Orbits: Left maxillary sinus retention cyst. Otherwise, sinuses are largely clear. No acute orbital findings. Other: No mastoid effusions IMPRESSION: 1. New focal area of T2 hyperintensity in the right aspect of the splenium of the corpus callosum with probable faint peripheral restricted diffusion, most likely an evolving subacute infarct. 2. Remote but interval white matter infarcts in the left frontal and parietal lobes. 3. Otherwise, no evidence of acute intracranial abnormality. Electronically Signed   By: Feliberto Harts M.D.   On: 08/02/2022 17:55   CT HEAD WO CONTRAST  Result Date: 08/02/2022 CLINICAL DATA:  82 year old female with history of sudden onset of severe headache. EXAM: CT HEAD WITHOUT CONTRAST TECHNIQUE: Contiguous axial images were obtained from the base of the skull through the vertex without intravenous contrast. RADIATION DOSE REDUCTION: This exam was performed according to the departmental dose-optimization program which includes automated exposure control, adjustment of the mA and/or kV according to patient size and/or use of iterative reconstruction technique. COMPARISON:  Head CT 04/14/2019. FINDINGS: Brain: New well-defined low-attenuation in the left frontal lobe white matter (axial image 16 of series 2), compatible with an old lacunar infarct. Patchy and confluent areas of decreased attenuation are noted throughout the deep and periventricular white matter of the cerebral hemispheres bilaterally,  compatible with chronic microvascular ischemic disease. 8 x 5 mm calcification along the posterior aspect of the falx cerebri to the right of midline (axial image 22 series 2), similar to prior studies, compatible with known meningioma. No evidence of acute infarction, hemorrhage, hydrocephalus, extra-axial collection or new mass lesion/mass effect. Vascular: No hyperdense vessel or unexpected calcification. Skull: Normal. Negative for fracture or focal lesion. Sinuses/Orbits: No acute finding. Other: None. IMPRESSION: 1. No acute intracranial abnormalities. 2. Mild chronic microvascular ischemic changes and old lacunar infarct in the left frontal lobe white matter (new compared to the prior study, but not acute). 3. Small parafalcine meningioma in the right parietal region, measuring 8 x 5 mm, similar to prior examinations. Electronically Signed   By: Trudie Reed M.D.   On: 08/02/2022 13:07   DG Chest Portable 1 View  Result Date: 08/02/2022 CLINICAL DATA:  Hypertensive crisis.  Headache and dizziness. EXAM: PORTABLE CHEST 1 VIEW COMPARISON:  04/18/2016 FINDINGS: The heart size and mediastinal contours are within normal limits. Aortic atherosclerotic calcification incidentally noted. Both lungs are clear. The visualized skeletal structures are unremarkable. IMPRESSION: No active disease. Electronically Signed   By:  Danae Orleans M.D.   On: 08/02/2022 12:06     EKG EKG Interpretation  Date/Time:  Thursday Aug 13 2022 13:11:39 EDT Ventricular Rate:  108 PR Interval:  119 QRS Duration: 90 QT Interval:  350 QTC Calculation: 470 R Axis:   -35 Text Interpretation: Sinus tachycardia Left axis deviation Non-specific ST-t changes Confirmed by Cathren Laine ([id number]) on 08/13/2022 1:33:14 PM  Radiology CT Head Wo Contrast  Result Date: 08/13/2022 CLINICAL DATA:  Head trauma, minor (Age >= 65y); Neck trauma (Age >= 65y). Unwitnessed fall. EXAM: CT HEAD WITHOUT CONTRAST CT CERVICAL SPINE WITHOUT CONTRAST  TECHNIQUE: Multidetector CT imaging of the head and cervical spine was performed following the standard protocol without intravenous contrast. Multiplanar CT image reconstructions of the cervical spine were also generated. RADIATION DOSE REDUCTION: This exam was performed according to the departmental dose-optimization program which includes automated exposure control, adjustment of the mA and/or kV according to patient size and/or use of iterative reconstruction technique. COMPARISON:  Head CT 08/02/2022.  MRI thoracic spine 12/15/2020. FINDINGS: CT HEAD FINDINGS Brain: No acute hemorrhage. Old subcortical infarcts in the left frontal and parietal lobes. Gray-white differentiation is otherwise preserved. No hydrocephalus or extra-axial collection. No mass effect or midline shift. Vascular: No hyperdense vessel or unexpected calcification. Skull: No calvarial fracture or suspicious bone lesion. Skull base is unremarkable. Sinuses/Orbits: Unremarkable. Other: None. CT CERVICAL SPINE FINDINGS Alignment: 3 mm degenerative anterolisthesis of C4 on C5. No traumatic malalignment. Skull base and vertebrae: Unchanged old superior endplate compression of T3. No acute fracture. Soft tissues and spinal canal: No prevertebral fluid or swelling. No visible canal hematoma. Disc levels: Mild cervical spondylosis without high-grade spinal canal stenosis. Upper chest: Unremarkable. Other: Atherosclerotic calcifications of the carotid bulbs. IMPRESSION: 1. No acute intracranial abnormality. 2. No acute cervical spine fracture or traumatic malalignment. 3. Old subcortical infarcts in the left frontal and parietal lobes. Electronically Signed   By: Orvan Falconer M.D.   On: 08/13/2022 15:21   CT Cervical Spine Wo Contrast  Result Date: 08/13/2022 CLINICAL DATA:  Head trauma, minor (Age >= 65y); Neck trauma (Age >= 65y). Unwitnessed fall. EXAM: CT HEAD WITHOUT CONTRAST CT CERVICAL SPINE WITHOUT CONTRAST TECHNIQUE: Multidetector CT  imaging of the head and cervical spine was performed following the standard protocol without intravenous contrast. Multiplanar CT image reconstructions of the cervical spine were also generated. RADIATION DOSE REDUCTION: This exam was performed according to the departmental dose-optimization program which includes automated exposure control, adjustment of the mA and/or kV according to patient size and/or use of iterative reconstruction technique. COMPARISON:  Head CT 08/02/2022.  MRI thoracic spine 12/15/2020. FINDINGS: CT HEAD FINDINGS Brain: No acute hemorrhage. Old subcortical infarcts in the left frontal and parietal lobes. Gray-white differentiation is otherwise preserved. No hydrocephalus or extra-axial collection. No mass effect or midline shift. Vascular: No hyperdense vessel or unexpected calcification. Skull: No calvarial fracture or suspicious bone lesion. Skull base is unremarkable. Sinuses/Orbits: Unremarkable. Other: None. CT CERVICAL SPINE FINDINGS Alignment: 3 mm degenerative anterolisthesis of C4 on C5. No traumatic malalignment. Skull base and vertebrae: Unchanged old superior endplate compression of T3. No acute fracture. Soft tissues and spinal canal: No prevertebral fluid or swelling. No visible canal hematoma. Disc levels: Mild cervical spondylosis without high-grade spinal canal stenosis. Upper chest: Unremarkable. Other: Atherosclerotic calcifications of the carotid bulbs. IMPRESSION: 1. No acute intracranial abnormality. 2. No acute cervical spine fracture or traumatic malalignment. 3. Old subcortical infarcts in the left frontal and parietal lobes. Electronically Signed  By: Orvan Falconer M.D.   On: 08/13/2022 15:21   DG Chest Port 1 View  Result Date: 08/13/2022 CLINICAL DATA:  Weakness and unwitnessed fall EXAM: PORTABLE CHEST 1 VIEW COMPARISON:  Chest radiograph dated 08/02/2022 FINDINGS: Right upper quadrant surgical clips. Normal lung volumes. No focal consolidations. No pleural  effusion or pneumothorax. The heart size and mediastinal contours are within normal limits. No radiographic finding of acute displaced fracture. IMPRESSION: No radiographic finding of acute displaced fracture. Electronically Signed   By: Agustin Cree M.D.   On: 08/13/2022 14:22    Procedures Procedures    Medications Ordered in ED Medications  lactated ringers bolus 1,000 mL (0 mLs Intravenous Stopped 08/13/22 1508)  0.9 %  sodium chloride infusion (Manually program via Guardrails IV Fluids) ( Intravenous New Bag/Given 08/13/22 1440)  pantoprazole (PROTONIX) 80 mg /NS 100 mL IVPB (0 mg Intravenous Stopped 08/13/22 1427)    ED Course/ Medical Decision Making/ A&P                             Medical Decision Making Problems Addressed: Acute GI bleeding: acute illness or injury with systemic symptoms that poses a threat to life or bodily functions Frequent falls: acute illness or injury    Details: Acute/chronic Generalized weakness: acute illness or injury with systemic symptoms that poses a threat to life or bodily functions Leukocytosis, unspecified type: acute illness or injury Melena: acute illness or injury with systemic symptoms that poses a threat to life or bodily functions Symptomatic anemia: acute illness or injury with systemic symptoms that poses a threat to life or bodily functions Thrombocytosis: chronic illness or injury  Amount and/or Complexity of Data Reviewed Independent Historian: EMS    Details: hx External Data Reviewed: labs and notes. Labs: ordered. Decision-making details documented in ED Course. Radiology: ordered and independent interpretation performed. Decision-making details documented in ED Course. ECG/medicine tests: ordered and independent interpretation performed. Decision-making details documented in ED Course. Discussion of management or test interpretation with external provider(s): Gi, medicine.   Risk Prescription drug management. Decision  regarding hospitalization.   Iv ns. Continuous pulse ox and cardiac monitoring. Labs ordered/sent. Imaging ordered.   Differential diagnosis includes anemia, gi bleeding, head trauma, etc. Dispo decision including potential need for admission considered - will get labs and imaging and reassess.   Reviewed nursing notes and prior charts for additional history. External reports reviewed. Additional history from: EMS.   Cardiac monitor: sinus rhythm, rate 90.  Labs reviewed/interpreted by me - hgb 7, much lower than prior. Transfuse prbcs. Protonix iv. Wbc and plt high. No fever/chills.   Xrays reviewed/interpreted by me - no pna.   CT reviewed/interpreted by me - no hem.   Medicine consulted for admission. Gi consulted.   Discussed pt with [facility] - they indicates [facility], to call [facility]. Discussed pt with [facility] doc on call, incl hgb, melena, etc - they will see.  Discussed with hospitalists, Dr Marland Mcalpine - will admit.  CRITICAL CARE RE: acute gi bleeding with severe anemia/drop in hgb with syncope/frequent falls, requiring emergent prbc transfusion, gi consultation.  Performed by: Suzi Roots Total critical care time: 110 minutes Critical care time was exclusive of separately billable procedures and treating other patients. Critical care was necessary to treat or prevent imminent or life-threatening deterioration. Critical care was time spent personally by me on the following activities: development of treatment plan with patient and/or  surrogate as well as nursing, discussions with consultants, evaluation of patient's response to treatment, examination of patient, obtaining history from patient or surrogate, ordering and performing treatments and interventions, ordering and review of laboratory studies, ordering and review of radiographic studies, pulse oximetry and re-evaluation of patient's condition.         Final Clinical Impression(s) / ED Diagnoses Final  diagnoses:  Acute GI bleeding  Frequent falls  Generalized weakness  Symptomatic anemia  Melena  Thrombocytosis  Leukocytosis, unspecified type    Rx / DC Orders ED Discharge Orders     None         Cathren Laine, MD 08/13/22 1530

## 2022-08-13 NOTE — ED Triage Notes (Signed)
From home via EMS with c/o unwitnessed fall, found on floor by family. Patient lives alone, spoke with family yesterday at 1600 and was WNL, but mentioned that she had fallen "a couple times". Patient is on plavix. Patient alert and oriented upon EMS arrival. Patient c/o abdominal pain with dark stools x 3 days.

## 2022-08-13 NOTE — Consult Note (Signed)
Referring Provider: ED Primary Care Physician:  Orpha Bur, MD Primary Gastroenterologist:  Dr. Ewing Schlein  Reason for Consultation:  GI bleed   HPI: Deanna Hill is a 82 y.o. female with past medical history of Parkinson's disease, history of recent CVA 10 days ago was discharged on aspirin and Plavix presented to the hospital with fall.  Was found to have anemia with hemoglobin of 7.1.  Her hemoglobin was normal 10 days ago.  GI is consulted for further evaluation.  Patient seen and examined at bedside in the emergency department.  Currently laying flat with a neck collar.  Not able to provide detailed history.  Called and discussed with patient's daughter over the phone.  Patient has been complaining of intermittent black stool for last 3 months.  Denies any abdominal pain, nausea or vomiting.  Last dose of aspirin and Plavix was yesterday morning as far as the family knows.    EGD in 2016 showed chronic gastritis.  Biopsies were negative for H. pylori. Colonoscopy in 2016 showed diverticulosis and hemorrhoids.  Repeat not recommended because of age.  Past Medical History:  Diagnosis Date   Anxiety    Coronary artery disease, non-occlusive 05/2013   Cath for Abn Myoview => mild CAD, 30% pCx. Otw normal Coronaries.  Normlal EF.   Essential tremor 06/19/2015   GERD (gastroesophageal reflux disease)    Hyperlipidemia    Hypertension    Hypothyroidism    IBS (irritable bowel syndrome)    Orthostatic hypotension 05/22/2016   Parkinson's disease    Senile calcific aortic valve sclerosis 04/2016   2D echo showed aortic sclerosis but no stenosis.  Normal wall motion.  GR 2 DD   Thrombocytosis 03/11/2011    Past Surgical History:  Procedure Laterality Date   ABDOMINAL HYSTERECTOMY  1979   CAROTID DOPPLERS  01/2017   normal Carotid, Sub-Clavian & Vertebral Arteries.   CATARACT EXTRACTION Bilateral 2015   Dr. Hollie Salk, LAPAROSCOPIC  2001   LEFT HEART  CATHETERIZATION WITH CORONARY ANGIOGRAM N/A 06/08/2013   Procedure: LEFT HEART CATHETERIZATION WITH CORONARY ANGIOGRAM;  Surgeon: Othella Boyer, MD;  Location: [facility] CATH LAB;  Service: Cardiovascular: Dr. Donnie Aho: Mild CAD, 30%pCx.  Otherwise nonobstructive disease.  Normal LV function.   TOE SURGERY Left 1994   TONSILLECTOMY  1952   TRANSTHORACIC ECHOCARDIOGRAM  02/'18, 11/'18   a) Normal EF of 60-65%.  Normal wall motion.  GR 2 DD.  Aortic sclerosis without stenosis;; b) Mod LVH. EF 60-65%. Ao Sclerosis w/o AS. Pseudonormal - Gr 2 DD.    Prior to Admission medications   Medication Sig Start Date End Date Taking? Authorizing Provider  acetaminophen (TYLENOL) 325 MG tablet Take 650 mg by mouth daily as needed for mild pain. Take extra strength tylenol 2 times daily    [provider]  aspirin EC 81 MG tablet Take 1 tablet (81 mg total) by mouth daily for 21 days. 08/03/22 08/24/22  Wouk, Wilfred Curtis, MD  BIOTIN PO Take 1 capsule by mouth daily.    [provider]  carbidopa-levodopa (SINEMET IR) 25-100 MG tablet Take 2 at 7am/2 at 11am/1 at 3pm Patient taking differently: Take 1-2 tablets by mouth See admin instructions. Take 2 tablets by mouth at 0700, 2 tablets at 1100, and 1 tablet at 1500. 07/21/22   Tat, Octaviano Batty, DO  Cholecalciferol (VITAMIN D) 2000 UNITS CAPS Take 1 capsule by mouth daily.    [provider]  clopidogrel (PLAVIX) 75 MG  tablet Take 1 tablet (75 mg total) by mouth daily. 08/04/22   Wouk, Wilfred Curtis, MD  hydrALAZINE (APRESOLINE) 25 MG tablet Take 25 mg by mouth daily. 07/29/22   [provider]  hydroxyurea (HYDREA) 500 MG capsule TAKE ONE CAPSULE BY MOUTH THREE TIMES A WEEK. MAY take with food. Patient taking differently: Take 500 mg by mouth See admin instructions. Take 500 mg (1 capsule) by mouth three times a week. 07/17/22   Serena Croissant, MD  levothyroxine (SYNTHROID, LEVOTHROID) 88 MCG tablet Take 88 mcg by mouth daily.    [provider]  losartan (COZAAR) 25 MG tablet Take 12.5 mg by mouth daily. Patient not taking: Reported on 08/03/2022 04/13/22   [provider]  nitroGLYCERIN (NITROSTAT) 0.4 MG SL tablet Place 1 tablet (0.4 mg total) under the tongue every 5 (five) minutes as needed for chest pain. 03/02/19   Marykay Lex, MD  Probiotic Product (PROBIOTIC PO) Take 1 tablet by mouth daily.    [provider]  rosuvastatin (CRESTOR) 10 MG tablet Take 1 tablet (10 mg total) by mouth daily. 08/04/22   Wouk, Wilfred Curtis, MD    Scheduled Meds:  sodium chloride   Intravenous Once   Continuous Infusions:  pantoprazole (PROTONIX) IV 80 mg (08/13/22 1404)   PRN Meds:.  Allergies as of 08/13/2022 - Review Complete 08/13/2022  Allergen Reaction Noted   Iodine Shortness Of Breath and Other (See Comments) 03/05/2011   Prednisone  12/30/2015   Shellfish allergy Anaphylaxis and Other (See Comments) 03/05/2011   Covid-19 mrna vacc (moderna) Other (See Comments) 10/03/2021   Gabapentin Other (See Comments) 10/03/2021   Meloxicam Other (See Comments) 10/03/2021   Other Other (See Comments) 10/03/2021   Topiramate Other (See Comments) 10/03/2021   Zoloft [sertraline] Other (See Comments) 10/03/2021    Family History  Problem Relation Age of Onset   Skin cancer Mother    Gallstones Brother    Gallstones Brother    Pulmonary embolism Maternal Aunt     Social History   Socioeconomic History   Marital status: Widowed    Spouse name: Not on file   Number of children: Not on file   Years of education: Not on file   Highest education level: Not on file  Occupational History   Occupation: retired    Comment: Ecologist  Tobacco Use   Smoking status: Never   Smokeless tobacco: Never  Vaping Use   Vaping Use: Never used  Substance and Sexual Activity   Alcohol use: No   Drug use: No    Comment: cbd oil roll on   Sexual activity: Not Currently  Other Topics  Concern   Not on file  Social History Narrative   Widowed.  Lives home alone.  Retired.  12 th grade education.     \No routine exercise.   Long-term exposure to passive smoke.      In addition to her true allergies, she had a syncopal episode after starting Zoloft.  She is allergic to feathers (down).  Meloxicam causes upset stomach.  Prednisone makes her face red and hot.  Topamax causes diarrhea and gabapentin causes lightheadedness.   Right handed   Live alone in a one story home   retired   International aid/development worker of [employer] Strain: Not on file  Food Insecurity: No Food Insecurity (08/03/2022)   Hunger Vital Sign    Worried About Running Out of Food in the Last Year:  Never true    Ran Out of Food in the Last Year: Never true  Transportation Needs: No Transportation Needs (08/03/2022)   PRAPARE - Administrator, Civil Service (Medical): No    Lack of Transportation (Non-Medical): No  Physical Activity: Not on file  Stress: Not on file  Social Connections: Not on file  Intimate Partner Violence: Not At Risk (08/03/2022)   Humiliation, Afraid, Rape, and Kick questionnaire    Fear of Current or Ex-Partner: No    Emotionally Abused: No    Physically Abused: No    Sexually Abused: No    Review of Systems: All negative except as stated above in HPI.  Physical Exam: Vital signs: Vitals:   08/13/22 1400 08/13/22 1415  BP: 134/62 (!) 146/66  Pulse:  (!) 101  Resp: (!) 26 20  Temp:    SpO2:  100%     General:   Laying flat in the bed with a neck collar in the place, looks somewhat uncomfortable Lungs: No visible respiratory distress Heart:  Regular rate and rhythm; no murmurs, clicks, rubs,  or gallops. Abdomen: Soft, nontender, nondistended, bowel sound present, no peritoneal signs Somewhat anxious Alert and oriented x 3 Rectal:  Deferred  GI:  Lab Results: Recent Labs    08/13/22 1256  WBC 33.2*  HGB 7.1*  HCT 23.3*  PLT 848*    BMET Recent Labs    08/13/22 1256  NA 142  K 4.6  CL 109  CO2 17*  GLUCOSE 184*  BUN 100*  CREATININE 1.33*  CALCIUM 8.6*   LFT Recent Labs    08/13/22 1256  PROT 5.3*  ALBUMIN 3.3*  AST 20  ALT 15  ALKPHOS 30*  BILITOT 0.5   PT/INR Recent Labs    08/13/22 1256  LABPROT 17.9*  INR 1.5*     Studies/Results: No results found.  Impression/Plan: -Melena for at least 3 days in setting of aspirin and Plavix use.  Likely from peptic ulcer disease. -Acute blood loss anemia -Recent CVA earlier this month.  Currently on aspirin and Plavix.  Plan was to keep her on dual antiplatelet therapy for 3 weeks followed by Plavix monotherapy.  Recommendations -------------------------- -Recommend supportive care for now with blood transfusion.  Patient's hemoglobin likely would be lower than 7.1 as he appears to be hemoconcentrated. -Continue IV Protonix twice daily -Tentative plan for EGD in the next few days.  Last dose of Plavix was yesterday morning -Okay to have full liquid diet.  Monitor H&H.  GI will follow.   LOS: 0 days   Kathi Der  MD, FACP 08/13/2022, 2:24 PM  Contact #  [phone]

## 2022-08-14 DIAGNOSIS — K921 Melena: Secondary | ICD-10-CM | POA: Diagnosis not present

## 2022-08-14 DIAGNOSIS — R531 Weakness: Secondary | ICD-10-CM | POA: Diagnosis not present

## 2022-08-14 DIAGNOSIS — K922 Gastrointestinal hemorrhage, unspecified: Secondary | ICD-10-CM | POA: Diagnosis not present

## 2022-08-14 DIAGNOSIS — R296 Repeated falls: Secondary | ICD-10-CM | POA: Diagnosis not present

## 2022-08-14 LAB — COMPREHENSIVE METABOLIC PANEL
ALT: 17 U/L (ref 0–44)
AST: 16 U/L (ref 15–41)
Albumin: 3 g/dL — ABNORMAL LOW (ref 3.5–5.0)
Alkaline Phosphatase: 31 U/L — ABNORMAL LOW (ref 38–126)
Anion gap: 8 (ref 5–15)
BUN: 80 mg/dL — ABNORMAL HIGH (ref 8–23)
CO2: 23 mmol/L (ref 22–32)
Calcium: 8.2 mg/dL — ABNORMAL LOW (ref 8.9–10.3)
Chloride: 110 mmol/L (ref 98–111)
Creatinine, Ser: 1.23 mg/dL — ABNORMAL HIGH (ref 0.44–1.00)
GFR, Estimated: 44 mL/min — ABNORMAL LOW (ref >60)
Glucose, Bld: 136 mg/dL — ABNORMAL HIGH (ref 70–99)
Potassium: 4.2 mmol/L (ref 3.5–5.1)
Sodium: 141 mmol/L (ref 135–145)
Total Bilirubin: 1 mg/dL (ref 0.3–1.2)
Total Protein: 5 g/dL — ABNORMAL LOW (ref 6.5–8.1)

## 2022-08-14 LAB — BPAM RBC
Blood Product Expiration Date: 202406252359
Blood Product Expiration Date: 202406252359

## 2022-08-14 LAB — CBC
HCT: 25 % — ABNORMAL LOW (ref 36.0–46.0)
Hemoglobin: 8.2 g/dL — ABNORMAL LOW (ref 12.0–15.0)
MCH: 30.9 pg (ref 26.0–34.0)
MCHC: 32.8 g/dL (ref 30.0–36.0)
MCV: 94.3 fL (ref 80.0–100.0)
Platelets: 487 10*3/uL — ABNORMAL HIGH (ref 150–400)
RBC: 2.65 MIL/uL — ABNORMAL LOW (ref 3.87–5.11)
RDW: 16.8 % — ABNORMAL HIGH (ref 11.5–15.5)
WBC: 22.5 10*3/uL — ABNORMAL HIGH (ref 4.0–10.5)
nRBC: 0 % (ref 0.0–0.2)

## 2022-08-14 LAB — GLUCOSE, CAPILLARY: Glucose-Capillary: 127 mg/dL — ABNORMAL HIGH (ref 70–99)

## 2022-08-14 LAB — CBC WITH DIFFERENTIAL/PLATELET
Abs Immature Granulocytes: 0.27 10*3/uL — ABNORMAL HIGH (ref 0.00–0.07)
Basophils Absolute: 0.1 10*3/uL (ref 0.0–0.1)
Basophils Relative: 0 %
Eosinophils Absolute: 0 10*3/uL (ref 0.0–0.5)
Eosinophils Relative: 0 %
HCT: 26.2 % — ABNORMAL LOW (ref 36.0–46.0)
Hemoglobin: 8.9 g/dL — ABNORMAL LOW (ref 12.0–15.0)
Immature Granulocytes: 1 %
Lymphocytes Relative: 3 %
Lymphs Abs: 0.6 10*3/uL — ABNORMAL LOW (ref 0.7–4.0)
MCH: 30.8 pg (ref 26.0–34.0)
MCHC: 34 g/dL (ref 30.0–36.0)
MCV: 90.7 fL (ref 80.0–100.0)
Monocytes Absolute: 1.8 10*3/uL — ABNORMAL HIGH (ref 0.1–1.0)
Monocytes Relative: 7 %
Neutro Abs: 20.8 10*3/uL — ABNORMAL HIGH (ref 1.7–7.7)
Neutrophils Relative %: 89 %
Platelets: 600 10*3/uL — ABNORMAL HIGH (ref 150–400)
RBC: 2.89 MIL/uL — ABNORMAL LOW (ref 3.87–5.11)
RDW: 16.2 % — ABNORMAL HIGH (ref 11.5–15.5)
WBC: 23.6 10*3/uL — ABNORMAL HIGH (ref 4.0–10.5)
nRBC: 0 % (ref 0.0–0.2)

## 2022-08-14 LAB — TYPE AND SCREEN
Antibody Screen: NEGATIVE
Unit division: 0
Unit division: 0

## 2022-08-14 LAB — PHOSPHORUS: Phosphorus: 4 mg/dL (ref 2.5–4.6)

## 2022-08-14 LAB — MAGNESIUM: Magnesium: 2 mg/dL (ref 1.7–2.4)

## 2022-08-14 LAB — TSH: TSH: 0.487 u[IU]/mL (ref 0.350–4.500)

## 2022-08-14 MED ORDER — METOPROLOL TARTRATE 25 MG PO TABS
25.0000 mg | ORAL_TABLET | Freq: Two times a day (BID) | ORAL | Status: DC
  Administered 2022-08-14 – 2022-08-18 (×10): 25 mg via ORAL
  Filled 2022-08-14 (×10): qty 1

## 2022-08-14 MED ORDER — ONDANSETRON HCL 4 MG/2ML IJ SOLN
4.0000 mg | Freq: Four times a day (QID) | INTRAMUSCULAR | Status: DC | PRN
  Administered 2022-08-14: 4 mg via INTRAVENOUS
  Filled 2022-08-14: qty 2

## 2022-08-14 NOTE — Progress Notes (Signed)
PROGRESS NOTE        PATIENT DETAILS Name: Deanna Hill Age: 82 y.o. Sex: female Date of Birth: 05/29/1940 Admit Date: 08/13/2022 Admitting Physician Merlene Laughter, DO ZOX:[email or web address], Grayling Congress, MD  Brief Summary: Patient is a 82 y.o.  female who was recently hospitalized for acute CVA-discharged on DAPT-presented with fall/weakness-in the setting of upper GI bleed and acute blood loss anemia.  See below for further details.    Significant events: 5/19-5/20>> hospitalization for acute CVA-discharged on aspirin/Plavix 5/30>> admit to [facility]-fall/weakness-melanotic stools-upper GI bleed.  Significant studies: 5/30>> CXR: No PNA 5/30>> CT head: No acute intracranial abnormality 5/30>> CT C-spine: No fracture/dislocation 5/30>> x-ray right shoulder: No fracture  Significant microbiology data: None  Procedures: None  Consults: GI  Subjective: Lying comfortably in bed-denies any chest pain or shortness of breath.  Objective: Vitals: Blood pressure (!) 136/58, pulse 89, temperature (!) 97.3 F (36.3 C), temperature source Oral, resp. rate 19, height 5\' 9"  (1.753 m), weight 58.5 kg, SpO2 99 %.   Exam: Gen Exam:Alert awake-not in any distress HEENT:atraumatic, normocephalic Chest: B/L clear to auscultation anteriorly CVS:S1S2 regular Abdomen:soft non tender, non distended Extremities:no edema Neurology: Non focal Skin: no rash  Pertinent Labs/Radiology:    Latest Ref Rng & Units 08/14/2022    4:39 AM 08/13/2022   12:56 PM 08/03/2022    4:20 AM  CBC  WBC 4.0 - 10.5 K/uL 23.6  33.2  13.5   Hemoglobin 12.0 - 15.0 g/dL 8.9  7.1  40.9   Hematocrit 36.0 - 46.0 % 26.2  23.3  41.6   Platelets 150 - 400 K/uL 600  848  682     Lab Results  Component Value Date   NA 141 08/14/2022   K 4.2 08/14/2022   CL 110 08/14/2022   CO2 23 08/14/2022    Assessment/Plan: UGI bleed with acute blood loss anemia No further melena overnight Hb stable 2 units  of PRBC on 5/30 PPI infusion CBC every 12 hours-and transfuse if significant drop in Hb EGD after Plavix washout GI following  AKI Mild-likely hemodynamically mediated Improving with supportive care Follow lytes.  Recent CVA Holding antiplatelets secondary to above Statin Appears to have mild left-sided weakness-likely secondary to recent CVA Continue PT/OT  Parkinson's disease Sinemet  HTN Stable-continue metoprolol  HLD Statin  Parkinson's disease Sinemet  Hypothyroidism Synthroid  Essential thrombocytosis Leukocytosis Platelet count at baseline More leukocytosis than usual-no evidence of infection at this point-possibly reactive Continue Hydrea  Debility/deconditioning PT/OT eval  Underweight: Estimated body mass index is 19.05 kg/m as calculated from the following:   Height as of this encounter: 5\' 9"  (1.753 m).   Weight as of this encounter: 58.5 kg.   Code status:   Code Status: DNR   DVT Prophylaxis: SCDs Start: 08/13/22 1948   Family Communication: Daughter-Dawn [phone]-updated over the phone on 5/31   Disposition Plan: Status is: Inpatient Remains inpatient appropriate because: Severity of illness   Planned Discharge Destination:Home health   Diet: Diet Order             Diet clear liquid Room service appropriate? Yes; Fluid consistency: Thin  Diet effective now                     Antimicrobial agents: Anti-infectives (From admission, onward)    None  MEDICATIONS: Scheduled Meds:  carbidopa-levodopa  1 tablet Oral q1600   carbidopa-levodopa  2 tablet Oral 2 times per day   cholecalciferol  1,000 Units Oral Daily   hydroxyurea  500 mg Oral See admin instructions   levothyroxine  88 mcg Oral Q0600   metoprolol tartrate  25 mg Oral BID   [START ON 08/17/2022] pantoprazole  40 mg Intravenous Q12H   rosuvastatin  10 mg Oral Daily   Continuous Infusions:  lactated ringers 75 mL/hr at 08/14/22 0630    pantoprazole 8 mg/hr (08/14/22 0024)   PRN Meds:.acetaminophen **OR** acetaminophen, albuterol, bisacodyl, hydrALAZINE, morphine injection, oxyCODONE, senna-docusate   I have personally reviewed following labs and imaging studies  LABORATORY DATA: CBC: Recent Labs  Lab 08/13/22 1256 08/14/22 0439  WBC 33.2* 23.6*  NEUTROABS  --  20.8*  HGB 7.1* 8.9*  HCT 23.3* 26.2*  MCV 100.9* 90.7  PLT 848* 600*    Basic Metabolic Panel: Recent Labs  Lab 08/13/22 1256 08/14/22 0439  NA 142 141  K 4.6 4.2  CL 109 110  CO2 17* 23  GLUCOSE 184* 136*  BUN 100* 80*  CREATININE 1.33* 1.23*  CALCIUM 8.6* 8.2*  MG  --  2.0  PHOS  --  4.0    GFR: Estimated Creatinine Clearance: 32 mL/min (A) (by C-G formula based on SCr of 1.23 mg/dL (H)).  Liver Function Tests: Recent Labs  Lab 08/13/22 1256 08/14/22 0439  AST 20 16  ALT 15 17  ALKPHOS 30* 31*  BILITOT 0.5 1.0  PROT 5.3* 5.0*  ALBUMIN 3.3* 3.0*   No results for input(s): "LIPASE", "AMYLASE" in the last 168 hours. No results for input(s): "AMMONIA" in the last 168 hours.  Coagulation Profile: Recent Labs  Lab 08/13/22 1256  INR 1.5*    Cardiac Enzymes: No results for input(s): "CKTOTAL", "CKMB", "CKMBINDEX", "TROPONINI" in the last 168 hours.  BNP (last 3 results) No results for input(s): "PROBNP" in the last 8760 hours.  Lipid Profile: No results for input(s): "CHOL", "HDL", "LDLCALC", "TRIG", "CHOLHDL", "LDLDIRECT" in the last 72 hours.  Thyroid Function Tests: Recent Labs    08/14/22 0439  TSH 0.487    Anemia Panel: Recent Labs    08/13/22 1256  VITAMINB12 302  FOLATE 19.6  FERRITIN 50  TIBC 284  IRON 71  RETICCTPCT 1.7    Urine analysis:    Component Value Date/Time   COLORURINE YELLOW 08/03/2022 1412   APPEARANCEUR CLEAR 08/03/2022 1412   LABSPEC >1.046 (H) 08/03/2022 1412   PHURINE 5.0 08/03/2022 1412   GLUCOSEU NEGATIVE 08/03/2022 1412   HGBUR NEGATIVE 08/03/2022 1412   BILIRUBINUR  NEGATIVE 08/03/2022 1412   KETONESUR 20 (A) 08/03/2022 1412   PROTEINUR NEGATIVE 08/03/2022 1412   UROBILINOGEN 0.2 11/20/2013 0630   NITRITE NEGATIVE 08/03/2022 1412   LEUKOCYTESUR TRACE (A) 08/03/2022 1412    Sepsis Labs: Lactic Acid, Venous No results found for: "LATICACIDVEN"  MICROBIOLOGY: No results found for this or any previous visit (from the past 240 hour(s)).  RADIOLOGY STUDIES/RESULTS: DG Shoulder Right  Result Date: 08/13/2022 CLINICAL DATA:  Right shoulder pain, initial encounter EXAM: RIGHT SHOULDER - 2+ VIEW COMPARISON:  None Available. FINDINGS: No acute fracture or dislocation is noted although evaluation is limited on this single view. Underlying bony thorax appears within normal limits. No soft tissue changes are seen. IMPRESSION: No acute abnormality is noted on this single view. Electronically Signed   By: Alcide Clever M.D.   On: 08/13/2022 20:45  CT Head Wo Contrast  Result Date: 08/13/2022 CLINICAL DATA:  Head trauma, minor (Age >= 65y); Neck trauma (Age >= 65y). Unwitnessed fall. EXAM: CT HEAD WITHOUT CONTRAST CT CERVICAL SPINE WITHOUT CONTRAST TECHNIQUE: Multidetector CT imaging of the head and cervical spine was performed following the standard protocol without intravenous contrast. Multiplanar CT image reconstructions of the cervical spine were also generated. RADIATION DOSE REDUCTION: This exam was performed according to the departmental dose-optimization program which includes automated exposure control, adjustment of the mA and/or kV according to patient size and/or use of iterative reconstruction technique. COMPARISON:  Head CT 08/02/2022.  MRI thoracic spine 12/15/2020. FINDINGS: CT HEAD FINDINGS Brain: No acute hemorrhage. Old subcortical infarcts in the left frontal and parietal lobes. Gray-white differentiation is otherwise preserved. No hydrocephalus or extra-axial collection. No mass effect or midline shift. Vascular: No hyperdense vessel or unexpected  calcification. Skull: No calvarial fracture or suspicious bone lesion. Skull base is unremarkable. Sinuses/Orbits: Unremarkable. Other: None. CT CERVICAL SPINE FINDINGS Alignment: 3 mm degenerative anterolisthesis of C4 on C5. No traumatic malalignment. Skull base and vertebrae: Unchanged old superior endplate compression of T3. No acute fracture. Soft tissues and spinal canal: No prevertebral fluid or swelling. No visible canal hematoma. Disc levels: Mild cervical spondylosis without high-grade spinal canal stenosis. Upper chest: Unremarkable. Other: Atherosclerotic calcifications of the carotid bulbs. IMPRESSION: 1. No acute intracranial abnormality. 2. No acute cervical spine fracture or traumatic malalignment. 3. Old subcortical infarcts in the left frontal and parietal lobes. Electronically Signed   By: Orvan Falconer M.D.   On: 08/13/2022 15:21   CT Cervical Spine Wo Contrast  Result Date: 08/13/2022 CLINICAL DATA:  Head trauma, minor (Age >= 65y); Neck trauma (Age >= 65y). Unwitnessed fall. EXAM: CT HEAD WITHOUT CONTRAST CT CERVICAL SPINE WITHOUT CONTRAST TECHNIQUE: Multidetector CT imaging of the head and cervical spine was performed following the standard protocol without intravenous contrast. Multiplanar CT image reconstructions of the cervical spine were also generated. RADIATION DOSE REDUCTION: This exam was performed according to the departmental dose-optimization program which includes automated exposure control, adjustment of the mA and/or kV according to patient size and/or use of iterative reconstruction technique. COMPARISON:  Head CT 08/02/2022.  MRI thoracic spine 12/15/2020. FINDINGS: CT HEAD FINDINGS Brain: No acute hemorrhage. Old subcortical infarcts in the left frontal and parietal lobes. Gray-white differentiation is otherwise preserved. No hydrocephalus or extra-axial collection. No mass effect or midline shift. Vascular: No hyperdense vessel or unexpected calcification. Skull: No  calvarial fracture or suspicious bone lesion. Skull base is unremarkable. Sinuses/Orbits: Unremarkable. Other: None. CT CERVICAL SPINE FINDINGS Alignment: 3 mm degenerative anterolisthesis of C4 on C5. No traumatic malalignment. Skull base and vertebrae: Unchanged old superior endplate compression of T3. No acute fracture. Soft tissues and spinal canal: No prevertebral fluid or swelling. No visible canal hematoma. Disc levels: Mild cervical spondylosis without high-grade spinal canal stenosis. Upper chest: Unremarkable. Other: Atherosclerotic calcifications of the carotid bulbs. IMPRESSION: 1. No acute intracranial abnormality. 2. No acute cervical spine fracture or traumatic malalignment. 3. Old subcortical infarcts in the left frontal and parietal lobes. Electronically Signed   By: Orvan Falconer M.D.   On: 08/13/2022 15:21   DG Chest Port 1 View  Result Date: 08/13/2022 CLINICAL DATA:  Weakness and unwitnessed fall EXAM: PORTABLE CHEST 1 VIEW COMPARISON:  Chest radiograph dated 08/02/2022 FINDINGS: Right upper quadrant surgical clips. Normal lung volumes. No focal consolidations. No pleural effusion or pneumothorax. The heart size and mediastinal contours are within normal limits. No  radiographic finding of acute displaced fracture. IMPRESSION: No radiographic finding of acute displaced fracture. Electronically Signed   By: Agustin Cree M.D.   On: 08/13/2022 14:22     LOS: 1 day   Jeoffrey Massed, MD  [facility]    To contact the attending provider between 7A-7P or the covering provider during after hours 7P-7A, please log into the web site [email or web address] and access using universal Morse password for that web site. If you do not have the password, please call the hospital operator.  08/14/2022, 9:09 AM

## 2022-08-14 NOTE — Evaluation (Addendum)
Physical Therapy Evaluation Patient Details Name: Deanna Hill MRN: [id number] DOB: 06/19/1940 Today's Date: 08/14/2022  History of Present Illness  Pt is 82 year old presented to [facility] on  08/13/22 for fall. Pt with weakness, dark stools - upper GI bleed suspected. PMH - Anxiety, GERD, CAD, Essential Temor, HTN, HLD, Orthostatic Hypotension, Parkinson's disease, Rt parafalcine meningioma  Clinical Impression  Pt admitted with above diagnosis and presents to PT with functional limitations due to deficits listed below (See PT problem list). Pt needs skilled PT to maximize independence and safety. Pt extremely weak and with significant decline since last hospitalization 11 days age when she was amb 200'. Currently requiring total assist for all mobility. Patient will benefit from continued inpatient follow up therapy, <3 hours/day          Recommendations for follow up therapy are one component of a multi-disciplinary discharge planning process, led by the attending physician.  Recommendations may be updated based on patient status, additional functional criteria and insurance authorization.  Follow Up Recommendations Can patient physically be transported by private vehicle: No     Assistance Recommended at Discharge Frequent or constant Supervision/Assistance  Patient can return home with the following  Two people to help with walking and/or transfers;Two people to help with bathing/dressing/bathroom;Assistance with feeding;Assistance with cooking/housework;Help with stairs or ramp for entrance;Assist for transportation    Equipment Recommendations None recommended by PT  Recommendations for Other Services       Functional Status Assessment Patient has had a recent decline in their functional status and demonstrates the ability to make significant improvements in function in a reasonable and predictable amount of time.     Precautions / Restrictions Precautions Precautions:  Fall Precaution Comments: Parkinson's disease Restrictions Weight Bearing Restrictions: No      Mobility  Bed Mobility Overal bed mobility: Needs Assistance Bed Mobility: Supine to Sit, Sit to Supine     Supine to sit: Total assist, HOB elevated Sit to supine: Total assist   General bed mobility comments: Assist for all aspects.    Transfers                   General transfer comment: Unable to attempt    Ambulation/Gait                  Stairs            Wheelchair Mobility    Modified Rankin (Stroke Patients Only)       Balance Overall balance assessment: Needs assistance Sitting-balance support: No upper extremity supported, Feet supported Sitting balance-Leahy Scale: Poor Sitting balance - Comments: Min to mod assist sitting EOB Postural control: Posterior lean                                   Pertinent Vitals/Pain Pain Assessment Pain Assessment: No/denies pain    Home Living Family/patient expects to be discharged to:: Private residence Living Arrangements: Alone Available Help at Discharge: Family;Available PRN/intermittently;Neighbor Type of Home: House Home Access: Stairs to enter Entrance Stairs-Rails: Right Entrance Stairs-Number of Steps: 3   Home Layout: One level Home Equipment: Agricultural consultant (2 wheels);Shower seat;Grab bars - toilet;Cane - quad;Rollator (4 wheels) (Hurry cane not true quad cane)      Prior Function Prior Level of Function : Independent/Modified Independent;Driving;History of Falls (last six months)             Mobility  Comments: hurry cane for out. Possibly rollator at times ADLs Comments: indep, drives     Hand Dominance   Dominant Hand: Right    Extremity/Trunk Assessment   Upper Extremity Assessment Upper Extremity Assessment: Defer to OT evaluation RUE Deficits / Details: Very weak. Able to demonstrate shoulder flexion to ~25% range. MMT: 3-/5 shoulder flexion, 3-/5  elbow extension, 3/5 elbow flexion, impaired gross grasp. LUE Deficits / Details: Very weak. Able to demonstrate shoulder flexion to ~25% range. MMT: 3-/5 shoulder flexion, 3-/5 elbow extension, 3/5 elbow flexion, impaired gross grasp. essential tremor present intermittently.    Lower Extremity Assessment Lower Extremity Assessment: RLE deficits/detail;LLE deficits/detail RLE Deficits / Details: grossly 2/5 LLE Deficits / Details: grossly 2/5    Cervical / Trunk Assessment Cervical / Trunk Assessment: Kyphotic  Communication   Communication: Expressive difficulties (Speaking very little and with very low volume)  Cognition Arousal/Alertness: Lethargic Behavior During Therapy: Flat affect Overall Cognitive Status: Difficult to assess                                 General Comments: Very slow processing and follows 1 step commands inconsistently with incr time        General Comments General comments (skin integrity, edema, etc.): BP supine 125/60, sitting 89/74    Exercises     Assessment/Plan    PT Assessment Patient needs continued PT services  PT Problem List Decreased strength;Decreased activity tolerance;Decreased balance;Decreased mobility;Decreased cognition       PT Treatment Interventions DME instruction;Gait training;Functional mobility training;Therapeutic activities;Therapeutic exercise;Balance training;Cognitive remediation;Neuromuscular re-education;Patient/family education    PT Goals (Current goals can be found in the Care Plan section)  Acute Rehab PT Goals Patient Stated Goal: not stated PT Goal Formulation: With patient Time For Goal Achievement: 08/28/22 Potential to Achieve Goals: Fair    Frequency Min 3X/week     Co-evaluation               AM-PAC PT "6 Clicks" Mobility  Outcome Measure Help needed turning from your back to your side while in a flat bed without using bedrails?: Total Help needed moving from lying on your  back to sitting on the side of a flat bed without using bedrails?: Total Help needed moving to and from a bed to a chair (including a wheelchair)?: Total Help needed standing up from a chair using your arms (e.g., wheelchair or bedside chair)?: Total Help needed to walk in hospital room?: Total Help needed climbing 3-5 steps with a railing? : Total 6 Click Score: 6    End of Session   Activity Tolerance: Patient limited by fatigue;Patient limited by lethargy Patient left: in bed;with call bell/phone within reach;with bed alarm set Nurse Communication: Mobility status;Need for lift equipment PT Visit Diagnosis: Other abnormalities of gait and mobility (R26.89);History of falling (Z91.81);Muscle weakness (generalized) (M62.81);Other symptoms and signs involving the nervous system (R29.898)    Time: 1150-1210 PT Time Calculation (min) (ACUTE ONLY): 20 min   Charges:   PT Evaluation $PT Eval Moderate Complexity: 1 Mod          [facility] PT Acute Rehabilitation Services Office [phone]   Angelina Ok [facility] 08/14/2022, 3:28 PM

## 2022-08-14 NOTE — Progress Notes (Signed)
SLP Cancellation Note  Patient Details Name: Deanna Hill MRN: [id number] DOB: 1940/05/05   Cancelled treatment:       Reason Eval/Treat Not Completed: Medical issues which prohibited therapy (SLP consult received and appreciated. Chart review completed. Per chart, pt restricted to full liquids pending EGD planned for 6/1. Will defer SLP efforts pending results of EGD.)  Clyde Canterbury, M.S., CCC-SLP Speech-Language Pathologist Secure Chat Preferred  O: [phone]  Woodroe Chen 08/14/2022, 11:39 AM

## 2022-08-14 NOTE — Progress Notes (Signed)
[facility] Progress Note  Deanna Hill 82 y.o. 07/23/1940  CC: Melena, anemia   Subjective: Patient seen and examined at bedside.  She denies any bowel movement today.  Complaining of nausea but denies any vomiting.  Complaining of weakness.  ROS : Afebrile, positive for fatigue and weakness.   Objective: Vital signs in last 24 hours: Vitals:   08/14/22 0656 08/14/22 0800  BP:  (!) 136/58  Pulse: 89 89  Resp: 17 19  Temp:  (!) 97.3 F (36.3 C)  SpO2: 97% 99%    Physical Exam: Elderly and weak appearing patient, resting comfortably.  Abdominal exam benign.   Lab Results: Recent Labs    08/13/22 1256 08/14/22 0439  NA 142 141  K 4.6 4.2  CL 109 110  CO2 17* 23  GLUCOSE 184* 136*  BUN 100* 80*  CREATININE 1.33* 1.23*  CALCIUM 8.6* 8.2*  MG  --  2.0  PHOS  --  4.0   Recent Labs    08/13/22 1256 08/14/22 0439  AST 20 16  ALT 15 17  ALKPHOS 30* 31*  BILITOT 0.5 1.0  PROT 5.3* 5.0*  ALBUMIN 3.3* 3.0*   Recent Labs    08/13/22 1256 08/14/22 0439  WBC 33.2* 23.6*  NEUTROABS  --  20.8*  HGB 7.1* 8.9*  HCT 23.3* 26.2*  MCV 100.9* 90.7  PLT 848* 600*   Recent Labs    08/13/22 1256  LABPROT 17.9*  INR 1.5*      Assessment/Plan: -Melena for at least 3 days in setting of aspirin and Plavix use.  Likely from peptic ulcer disease. -Acute blood loss anemia -Recent CVA earlier this month.  Currently on aspirin and Plavix.  Plan was to keep her on dual antiplatelet therapy for 3 weeks followed by Plavix monotherapy.  Last dose of Plavix around 2 to 3 days ago.   Recommendations -------------------------- -Okay to have full liquid diet today. -Plan for EGD tomorrow to rule out any bleeding source and for endoscopic treatment if appropriate, as patient will need ongoing antiplatelet therapy for recent CVA. -Keep n.p.o. past midnight. -Continue IV PPI.  Risks (bleeding, infection, bowel perforation that could require surgery,  sedation-related changes in cardiopulmonary systems), benefits (identification and possible treatment of source of symptoms, exclusion of certain causes of symptoms), and alternatives (watchful waiting, radiographic imaging studies, empiric medical treatment)  were explained to patient/family in detail and patient wishes to proceed.    Kathi Der MD, FACP 08/14/2022, 10:11 AM  Contact #  [phone]

## 2022-08-14 NOTE — Evaluation (Signed)
Occupational Therapy Evaluation Patient Details Name: Deanna Hill MRN: [id number] DOB: 1940-10-13 Today's Date: 08/14/2022   History of Present Illness Pt is 82 year old presented to [facility] on  08/13/22 for fall. Pt with weakness, dark stools - upper GI bleed suspected. PMH - Anxiety, GERD, CAD, Essential Temor, HTN, HLD, Orthostatic Hypotension, Parkinson's disease, Rt parafalcine meningioma   Clinical Impression   Pt admitted with the above diagnosis. Pt currently with functional limitations due to the deficits listed below (see OT Problem List). Prior to admit, pt was living alone completing all ADL and IADL tasks at Mod I-independent level including driving. Pt demonstrates a significant change in functional performance from 1 week ago when she was admitted and assessed by therapy services. Pt will benefit from acute skilled OT to increase their safety and independence with ADL and functional mobility for ADL to facilitate discharge. Patient will benefit from continued inpatient follow up therapy, <3 hours/day. OT will continue to follow patient acutely.        Recommendations for follow up therapy are one component of a multi-disciplinary discharge planning process, led by the attending physician.  Recommendations may be updated based on patient status, additional functional criteria and insurance authorization.   Assistance Recommended at Discharge Frequent or constant Supervision/Assistance  Patient can return home with the following A lot of help with walking and/or transfers;A lot of help with bathing/dressing/bathroom;Assistance with cooking/housework;Help with stairs or ramp for entrance;Assistance with feeding;Assist for transportation;Direct supervision/assist for financial management;Direct supervision/assist for medications management    Functional Status Assessment  Patient has had a recent decline in their functional status and demonstrates the ability to make significant  improvements in function in a reasonable and predictable amount of time.  Equipment Recommendations  None recommended by OT       Precautions / Restrictions Precautions Precautions: Fall Precaution Comments: Parkinson's disease Restrictions Weight Bearing Restrictions: No      Mobility Bed Mobility Overal bed mobility: Needs Assistance Bed Mobility: Supine to Sit, Sit to Supine     Supine to sit: Max assist, HOB elevated Sit to supine: Max assist   General bed mobility comments: increased time, Max VC for technique, hand over hand assist for hand set-up in order to grab bed rail and assist. Patient Response: Flat affect  Transfers Overall transfer level:  (unable to assess due to safety concerns.)        Balance Overall balance assessment: Needs assistance, History of Falls Sitting-balance support: No upper extremity supported, Feet supported Sitting balance-Leahy Scale: Poor Sitting balance - Comments: sitting EOB. Pt unable to maintain sitting balance for longer than 15-20 seconds. Unable to self correct posture. Postural control: Posterior lean, Left lateral lean Standing balance support:  (unable to assess due to safety concerns)         ADL either performed or assessed with clinical judgement   ADL Overall ADL's : Needs assistance/impaired Eating/Feeding: Total assistance Eating/Feeding Details (indicate cue type and reason): drank ginger ale from straw with total assist to manage cup and straw. Grooming: Total assistance;Bed level   Upper Body Bathing: Total assistance;Bed level   Lower Body Bathing: Total assistance;Bed level   Upper Body Dressing : Total assistance;Bed level   Lower Body Dressing: Total assistance;Bed level     Toilet Transfer Details (indicate cue type and reason): unable to assess due to safety concerns Toileting- Clothing Manipulation and Hygiene: Total assistance;Bed level  Vision Baseline Vision/History: 1  Wears glasses (readers) Ability to See in Adequate Light: 0 Adequate              Pertinent Vitals/Pain Pain Assessment Pain Assessment: Faces Faces Pain Scale: Hurts a little bit Pain Location: right shoulder and elbow during bed mobility. Pain Descriptors / Indicators: Grimacing Pain Intervention(s): Monitored during session     Hand Dominance Right   Extremity/Trunk Assessment Upper Extremity Assessment Upper Extremity Assessment: RUE deficits/detail;LUE deficits/detail RUE Deficits / Details: Very weak. Able to demonstrate shoulder flexion to ~25% range. MMT: 3-/5 shoulder flexion, 3-/5 elbow extension, 3/5 elbow flexion, impaired gross grasp. LUE Deficits / Details: Very weak. Able to demonstrate shoulder flexion to ~25% range. MMT: 3-/5 shoulder flexion, 3-/5 elbow extension, 3/5 elbow flexion, impaired gross grasp. essential tremor present intermittently.   Lower Extremity Assessment Lower Extremity Assessment: Generalized weakness   Cervical / Trunk Assessment Cervical / Trunk Assessment: Kyphotic   Communication Communication Communication: Expressive difficulties (very soft spoken with history of parkinson's. Voice did improve slightly at end of session)   Cognition Arousal/Alertness: Lethargic Behavior During Therapy: Flat affect Overall Cognitive Status: Difficult to assess  Due to communication impairment - soft spoken voice. Difficulty with articulation and voice volume. Delayed processing time.    General Comments  tachy while seated on EOB.            Home Living Family/patient expects to be discharged to:: Private residence Living Arrangements: Alone Available Help at Discharge: Family;Available PRN/intermittently;Neighbor Type of Home: House Home Access: Stairs to enter [employer] of Steps: 3   Home Layout: One level     Bathroom Shower/Tub: Producer, television/film/video: Handicapped height     Home Equipment: Agricultural consultant  (2 wheels);Shower seat;Grab bars - toilet;Cane - quad;Rollator (4 wheels) (hurry cane)          Prior Functioning/Environment Prior Level of Function : Independent/Modified Independent;Driving;History of Falls (last six months)             Mobility Comments: quad cane for outside mobility, no AD in the home. Multiple recent falls ADLs Comments: indep, drives        OT Problem List: Decreased strength;Decreased activity tolerance;Decreased range of motion;Impaired balance (sitting and/or standing);Decreased safety awareness;Decreased knowledge of use of DME or AE;Decreased knowledge of precautions;Decreased coordination;Pain;Impaired UE functional use      OT Treatment/Interventions: Self-care/ADL training;Therapeutic exercise;DME and/or AE instruction;Therapeutic activities;Patient/family education;Balance training;Manual therapy;Modalities;Neuromuscular education    OT Goals(Current goals can be found in the care plan section) Acute Rehab OT Goals Patient Stated Goal: to receive a "new me." OT Goal Formulation: Patient unable to participate in goal setting Time For Goal Achievement: 08/28/22 Potential to Achieve Goals: Fair  OT Frequency: Min 2X/week       AM-PAC OT "6 Clicks" Daily Activity     Outcome Measure Help from another person eating meals?: Total Help from another person taking care of personal grooming?: Total Help from another person toileting, which includes using toliet, bedpan, or urinal?: Total Help from another person bathing (including washing, rinsing, drying)?: Total Help from another person to put on and taking off regular upper body clothing?: Total Help from another person to put on and taking off regular lower body clothing?: Total 6 Click Score: 6   End of Session    Activity Tolerance: Patient limited by lethargy;Patient limited by fatigue Patient left: in bed;with call bell/phone within reach;with bed alarm set  OT Visit Diagnosis:  Unsteadiness on  feet (R26.81);Repeated falls (R29.6);Muscle weakness (generalized) (M62.81);Ataxia, unspecified (R27.0);Other symptoms and signs involving the nervous system (R29.898);Pain Pain - Right/Left: Right Pain - part of body: Shoulder;Arm                Time: 1324-1400 OT Time Calculation (min): 36 min Charges:  OT General Charges $OT Visit: 1 Visit OT Evaluation $OT Eval Moderate Complexity: 1 Mod OT Treatments $Therapeutic Activity: 8-22 mins  Limmie Yazleemar, OTR/L,CBIS  Supplemental OT - [facility] and [facility] Secure Chat Preferred    Jaleena Viviani, Charisse March 08/14/2022, 2:14 PM

## 2022-08-15 ENCOUNTER — Encounter (HOSPITAL_COMMUNITY)
Admission: EM | Disposition: A | Discharge status: Skilled Nursing Facility | Payer: Self-pay | Source: Home / Self Care | Attending: Internal Medicine

## 2022-08-15 ENCOUNTER — Encounter (HOSPITAL_COMMUNITY): Payer: Self-pay | Admitting: Internal Medicine

## 2022-08-15 ENCOUNTER — Inpatient Hospital Stay (HOSPITAL_COMMUNITY): Payer: Medicare Other | Admitting: Anesthesiology

## 2022-08-15 DIAGNOSIS — K921 Melena: Secondary | ICD-10-CM | POA: Diagnosis not present

## 2022-08-15 DIAGNOSIS — D62 Acute posthemorrhagic anemia: Secondary | ICD-10-CM

## 2022-08-15 DIAGNOSIS — R296 Repeated falls: Secondary | ICD-10-CM | POA: Diagnosis not present

## 2022-08-15 DIAGNOSIS — K2970 Gastritis, unspecified, without bleeding: Secondary | ICD-10-CM

## 2022-08-15 DIAGNOSIS — K3189 Other diseases of stomach and duodenum: Secondary | ICD-10-CM

## 2022-08-15 DIAGNOSIS — I119 Hypertensive heart disease without heart failure: Secondary | ICD-10-CM

## 2022-08-15 DIAGNOSIS — I2510 Atherosclerotic heart disease of native coronary artery without angina pectoris: Secondary | ICD-10-CM

## 2022-08-15 DIAGNOSIS — R531 Weakness: Secondary | ICD-10-CM | POA: Diagnosis not present

## 2022-08-15 DIAGNOSIS — K922 Gastrointestinal hemorrhage, unspecified: Secondary | ICD-10-CM | POA: Diagnosis not present

## 2022-08-15 HISTORY — PX: ESOPHAGOGASTRODUODENOSCOPY (EGD) WITH PROPOFOL: SHX5813

## 2022-08-15 LAB — CBC
HCT: 24.7 % — ABNORMAL LOW (ref 36.0–46.0)
HCT: 23.9 % — ABNORMAL LOW (ref 36.0–46.0)
Hemoglobin: 8.2 g/dL — ABNORMAL LOW (ref 12.0–15.0)
Hemoglobin: 7.7 g/dL — ABNORMAL LOW (ref 12.0–15.0)
MCH: 31.5 pg (ref 26.0–34.0)
MCH: 31.2 pg (ref 26.0–34.0)
MCHC: 33.2 g/dL (ref 30.0–36.0)
MCHC: 32.2 g/dL (ref 30.0–36.0)
MCV: 95 fL (ref 80.0–100.0)
MCV: 96.8 fL (ref 80.0–100.0)
Platelets: 472 10*3/uL — ABNORMAL HIGH (ref 150–400)
Platelets: 452 10*3/uL — ABNORMAL HIGH (ref 150–400)
RBC: 2.6 MIL/uL — ABNORMAL LOW (ref 3.87–5.11)
RBC: 2.47 MIL/uL — ABNORMAL LOW (ref 3.87–5.11)
RDW: 16.8 % — ABNORMAL HIGH (ref 11.5–15.5)
RDW: 16.5 % — ABNORMAL HIGH (ref 11.5–15.5)
WBC: 19.9 10*3/uL — ABNORMAL HIGH (ref 4.0–10.5)
WBC: 18.4 10*3/uL — ABNORMAL HIGH (ref 4.0–10.5)
nRBC: 0 % (ref 0.0–0.2)
nRBC: 0 % (ref 0.0–0.2)

## 2022-08-15 LAB — BASIC METABOLIC PANEL
Anion gap: 6 (ref 5–15)
BUN: 57 mg/dL — ABNORMAL HIGH (ref 8–23)
CO2: 25 mmol/L (ref 22–32)
Calcium: 7.9 mg/dL — ABNORMAL LOW (ref 8.9–10.3)
Chloride: 111 mmol/L (ref 98–111)
Creatinine, Ser: 1.11 mg/dL — ABNORMAL HIGH (ref 0.44–1.00)
GFR, Estimated: 49 mL/min — ABNORMAL LOW (ref >60)
Glucose, Bld: 105 mg/dL — ABNORMAL HIGH (ref 70–99)
Potassium: 3.9 mmol/L (ref 3.5–5.1)
Sodium: 142 mmol/L (ref 135–145)

## 2022-08-15 LAB — GLUCOSE, CAPILLARY: Glucose-Capillary: 112 mg/dL — ABNORMAL HIGH (ref 70–99)

## 2022-08-15 SURGERY — ESOPHAGOGASTRODUODENOSCOPY (EGD) WITH PROPOFOL
Anesthesia: Monitor Anesthesia Care

## 2022-08-15 MED ORDER — SODIUM CHLORIDE 0.9 % IV SOLN: INTRAVENOUS | Status: DC

## 2022-08-15 MED ORDER — LACTATED RINGERS IV SOLN
INTRAVENOUS | Status: AC | PRN
  Administered 2022-08-15: 1000 mL via INTRAVENOUS

## 2022-08-15 MED ORDER — CLOPIDOGREL BISULFATE 75 MG PO TABS
75.0000 mg | ORAL_TABLET | Freq: Every day | ORAL | Status: DC
  Administered 2022-08-16 – 2022-08-18 (×3): 75 mg via ORAL
  Filled 2022-08-15 (×3): qty 1

## 2022-08-15 MED ORDER — PHENYLEPHRINE HCL-NACL 20-0.9 MG/250ML-% IV SOLN
INTRAVENOUS | Status: DC | PRN
  Administered 2022-08-15: 20 ug/min via INTRAVENOUS

## 2022-08-15 MED ORDER — PROPOFOL 500 MG/50ML IV EMUL
INTRAVENOUS | Status: DC | PRN
  Administered 2022-08-15: 50 ug/kg/min via INTRAVENOUS

## 2022-08-15 MED ORDER — PROPOFOL 10 MG/ML IV BOLUS
INTRAVENOUS | Status: DC | PRN
  Administered 2022-08-15: 30 mg via INTRAVENOUS

## 2022-08-15 SURGICAL SUPPLY — 15 items

## 2022-08-15 NOTE — Transfer of Care (Signed)
Immediate Anesthesia Transfer of Care Note  Patient: Deanna Hill  Procedure(s) Performed: ESOPHAGOGASTRODUODENOSCOPY (EGD) WITH PROPOFOL  Patient Location: Endoscopy Unit  Anesthesia Type:MAC  Level of Consciousness: awake, alert , and oriented  Airway & Oxygen Therapy: Patient Spontanous Breathing  Post-op Assessment: Report given to RN and Post -op Vital signs reviewed and stable  Post vital signs: Reviewed and stable  Last Vitals:  Vitals Value Taken Time  BP 170/79   Temp 98   Pulse 60   Resp 16   SpO2 98     Last Pain:  Vitals:   08/15/22 1139  TempSrc: Tympanic  PainSc: 0-No pain      Patients Stated Pain Goal: 3 (08/14/22 0815)  Complications: No notable events documented.

## 2022-08-15 NOTE — Evaluation (Signed)
Clinical/Bedside Swallow Evaluation Patient Details  Name: Deanna Hill MRN: [id number] Date of Birth: 1941/02/14  Today's Date: 08/15/2022 Time: SLP Start Time (ACUTE ONLY): 1505 SLP Stop Time (ACUTE ONLY): 1525 SLP Time Calculation (min) (ACUTE ONLY): 20 min  Past Medical History:  Past Medical History:  Diagnosis Date   Anxiety    Coronary artery disease, non-occlusive 05/2013   Cath for Abn Myoview => mild CAD, 30% pCx. Otw normal Coronaries.  Normlal EF.   Essential tremor 06/19/2015   GERD (gastroesophageal reflux disease)    Hyperlipidemia    Hypertension    Hypothyroidism    IBS (irritable bowel syndrome)    Orthostatic hypotension 05/22/2016   Parkinson's disease    Senile calcific aortic valve sclerosis 04/2016   2D echo showed aortic sclerosis but no stenosis.  Normal wall motion.  GR 2 DD   Thrombocytosis 03/11/2011   Past Surgical History:  Past Surgical History:  Procedure Laterality Date   ABDOMINAL HYSTERECTOMY  1979   CAROTID DOPPLERS  01/2017   normal Carotid, Sub-Clavian & Vertebral Arteries.   CATARACT EXTRACTION Bilateral 2015   Dr. Hollie Salk, LAPAROSCOPIC  2001   LEFT HEART CATHETERIZATION WITH CORONARY ANGIOGRAM N/A 06/08/2013   Procedure: LEFT HEART CATHETERIZATION WITH CORONARY ANGIOGRAM;  Surgeon: Othella Boyer, MD;  Location: [facility] CATH LAB;  Service: Cardiovascular: Dr. Donnie Aho: Mild CAD, 30%pCx.  Otherwise nonobstructive disease.  Normal LV function.   TOE SURGERY Left 1994   TONSILLECTOMY  1952   TRANSTHORACIC ECHOCARDIOGRAM  02/'18, 11/'18   a) Normal EF of 60-65%.  Normal wall motion.  GR 2 DD.  Aortic sclerosis without stenosis;; b) Mod LVH. EF 60-65%. Ao Sclerosis w/o AS. Pseudonormal - Gr 2 DD.   HPI:  Patient is an 82 y.o. female with PMH: Parkinson's disease, CVA (10 days ago) who presented to the hospital on 08/13/22 with fall at home. She was found to have an Hgb of 7.1. (was normal 10 days ago) GI consulted and EGD  performed 6/1, which reported gastritis, normal esophagus, congested mucosa in the pylorus.    Assessment / Plan / Recommendation  Clinical Impression  SLP evaluated patient's swallow function at bedside, which was limited to clear liquids only per GI recommendations. Although SLP did not observe any overt s/s aspiration and patient had a timely swallow initiation, she c/o h/o swallowing difficulties. She indicated that she has difficulty swallowing some solid foods, especially breads and she will at times cough food back up that she wasn't able to swallow. She also indicated that if she is in a hurry while eating she will have difficulty. She c/o dry mouth and upon inspection, her oral mucosa was quite dry. SLP provided patient with oral rinse and oral moisturizer. As patient had a recent CVA, has diagnosis of Parkinson's disease and has been having dysphagia symptoms, SLP plans to follow up at least one time to ensure she is tolerating PO's. SLP Visit Diagnosis: Dysphagia, unspecified (R13.10)    Aspiration Risk  Mild aspiration risk    Diet Recommendation Thin liquid   Liquid Administration via: Cup;Straw Medication Administration: Other (Comment) (as tolerated) Compensations: Slow rate;Small sips/bites Postural Changes: Seated upright at 90 degrees    Other  Recommendations Oral Care Recommendations: Oral care BID    Recommendations for follow up therapy are one component of a multi-disciplinary discharge planning process, led by the attending physician.  Recommendations may be updated based on patient status, additional functional criteria and insurance authorization.  Follow up Recommendations No SLP follow up      Assistance Recommended at Discharge    Functional Status Assessment Patient has had a recent decline in their functional status and demonstrates the ability to make significant improvements in function in a reasonable and predictable amount of time.  Frequency and Duration  min 1 x/week  1 week       Prognosis Prognosis for improved oropharyngeal function: Good      Swallow Study   General Date of Onset: 08/15/22 HPI: Patient is an 82 y.o. female with PMH: Parkinson's disease, CVA (10 days ago) who presented to the hospital on 08/13/22 with fall at home. She was found to have an Hgb of 7.1. (was normal 10 days ago) GI consulted and EGD performed 6/1, which reported gastritis, normal esophagus, congested mucosa in the pylorus. Type of Study: Bedside Swallow Evaluation Previous Swallow Assessment: none found Diet Prior to this Study: Thin liquids (Level 0);Clear liquid diet Temperature Spikes Noted: No Respiratory Status: Room air History of Recent Intubation: No Behavior/Cognition: Alert;Cooperative;Pleasant mood Oral Cavity Assessment: Dry Oral Care Completed by SLP: No Oral Cavity - Dentition: Adequate natural dentition Vision: Functional for self-feeding Self-Feeding Abilities: Able to feed self Patient Positioning: Upright in bed Baseline Vocal Quality: Low vocal intensity Volitional Cough: Weak Volitional Swallow: Able to elicit    Oral/Motor/Sensory Function Overall Oral Motor/Sensory Function: Within functional limits   Ice Chips     Thin Liquid Thin Liquid: Within functional limits Presentation: Self Fed;Straw    Nectar Thick     Honey Thick     Puree Puree: Not tested   Solid     Solid: Not tested      Angela Nevin, MA, CCC-SLP Speech Therapy

## 2022-08-15 NOTE — Progress Notes (Addendum)
PROGRESS NOTE        PATIENT DETAILS Name: Deanna Hill Age: 82 y.o. Sex: female Date of Birth: [birth year] Admit Date: 08/13/2022 Admitting Physician Merlene Laughter, DO ZOX:[id number], Grayling Congress, MD  Brief Summary: Patient is a 82 y.o.  female who was recently hospitalized for acute CVA-discharged on DAPT-presented with fall/weakness-in the setting of upper GI bleed and acute blood loss anemia.  See below for further details.    Significant events: 5/19-5/20>> hospitalization for acute CVA-discharged on aspirin/Plavix 5/30>> admit to [facility]-fall/weakness-melanotic stools-upper GI bleed.  Significant studies: 5/30>> CXR: No PNA 5/30>> CT head: No acute intracranial abnormality 5/30>> CT C-spine: No fracture/dislocation 5/30>> x-ray right shoulder: No fracture  Significant microbiology data: None  Procedures: None  Consults: GI  Subjective: Lying comfortably in bed-no major issues.  Claims she did not have a BM yesterday.  Objective: Vitals: Blood pressure (!) 158/72, pulse (!) 107, temperature 97.6 F (36.4 C), temperature source Tympanic, resp. rate 15, height 5\' 9"  (1.753 m), weight 58.3 kg, SpO2 96 %.   Exam: Gen Exam:Alert awake-not in any distress HEENT:atraumatic, normocephalic Chest: B/L clear to auscultation anteriorly CVS:S1S2 regular Abdomen:soft non tender, non distended Extremities:no edema Neurology: Non focal Skin: no rash  Pertinent Labs/Radiology:    Latest Ref Rng & Units 08/15/2022    5:37 AM 08/14/2022    4:41 PM 08/14/2022    4:39 AM  CBC  WBC 4.0 - 10.5 K/uL 19.9  22.5  23.6   Hemoglobin 12.0 - 15.0 g/dL 8.2  8.2  8.9   Hematocrit 36.0 - 46.0 % 24.7  25.0  26.2   Platelets 150 - 400 K/uL 472  487  600     Lab Results  Component Value Date   NA 142 08/15/2022   K 3.9 08/15/2022   CL 111 08/15/2022   CO2 25 08/15/2022    Assessment/Plan: UGI bleed with acute blood loss anemia No further melena since  admission Hb stable 2 units of PRBC on 5/30 PPI infusion Follow CBC every 12 hours EGD later today Await further recommendations from gastroenterology.    Addendum Gastric erosions on EGD-Per GI okay to resume Plavix.  Will avoid aspirin for now.  AKI Mild-likely hemodynamically mediated Improving with supportive care Follow lytes.  Recent CVA Holding antiplatelets secondary to above Statin Appears to have mild left-sided weakness-likely secondary to recent CVA Continue PT/OT  Parkinson's disease Sinemet  HTN Stable-continue metoprolol  HLD Statin  Parkinson's disease Sinemet  Hypothyroidism Synthroid  Essential thrombocytosis Leukocytosis Platelet count at baseline More leukocytosis than usual-no evidence of infection at this point-possibly reactive-thankfully downtrending-continue to monitor off antibiotics. Continue Hydrea  Debility/deconditioning PT/OT eval-SNF recommended.  Underweight: Estimated body mass index is 18.98 kg/m as calculated from the following:   Height as of this encounter: 5\' 9"  (1.753 m).   Weight as of this encounter: 58.3 kg.   Code status:   Code Status: DNR   DVT Prophylaxis: SCDs Start: 08/13/22 1948   Family Communication: Daughter-Dawn [phone]-updated over the phone on 5/31   Disposition Plan: Status is: Inpatient Remains inpatient appropriate because: Severity of illness   Planned Discharge Destination:Home health   Diet: Diet Order             Diet NPO time specified Except for: Sips with Meds  Diet effective midnight  Diet NPO time specified  Diet effective midnight                     Antimicrobial agents: Anti-infectives (From admission, onward)    None        MEDICATIONS: Scheduled Meds:  [MAR Hold] carbidopa-levodopa  1 tablet Oral q1600   [MAR Hold] carbidopa-levodopa  2 tablet Oral 2 times per day   [MAR Hold] cholecalciferol  1,000 Units Oral Daily   [MAR Hold]  hydroxyurea  500 mg Oral Q M,W,F   [MAR Hold] levothyroxine  88 mcg Oral Q0600   [MAR Hold] metoprolol tartrate  25 mg Oral BID   [MAR Hold] pantoprazole  40 mg Intravenous Q12H   [MAR Hold] rosuvastatin  10 mg Oral Daily   Continuous Infusions:  sodium chloride     lactated ringers 10 mL/hr at 08/15/22 1000   lactated ringers 1,000 mL (08/15/22 1145)   pantoprazole Stopped (08/15/22 0958)   PRN Meds:.[MAR Hold] acetaminophen **OR** [MAR Hold] acetaminophen, [MAR Hold] albuterol, [MAR Hold] bisacodyl, [MAR Hold] hydrALAZINE, lactated ringers, [MAR Hold]  morphine injection, [MAR Hold] ondansetron (ZOFRAN) IV, [MAR Hold] oxyCODONE, [MAR Hold] senna-docusate   I have personally reviewed following labs and imaging studies  LABORATORY DATA: CBC: Recent Labs  Lab 08/13/22 1256 08/14/22 0439 08/14/22 1641 08/15/22 0537  WBC 33.2* 23.6* 22.5* 19.9*  NEUTROABS  --  20.8*  --   --   HGB 7.1* 8.9* 8.2* 8.2*  HCT 23.3* 26.2* 25.0* 24.7*  MCV 100.9* 90.7 94.3 95.0  PLT 848* 600* 487* 472*     Basic Metabolic Panel: Recent Labs  Lab 08/13/22 1256 08/14/22 0439 08/15/22 0537  NA 142 141 142  K 4.6 4.2 3.9  CL 109 110 111  CO2 17* 23 25  GLUCOSE 184* 136* 105*  BUN 100* 80* 57*  CREATININE 1.33* 1.23* 1.11*  CALCIUM 8.6* 8.2* 7.9*  MG  --  2.0  --   PHOS  --  4.0  --      GFR: Estimated Creatinine Clearance: 35.3 mL/min (A) (by C-G formula based on SCr of 1.11 mg/dL (H)).  Liver Function Tests: Recent Labs  Lab 08/13/22 1256 08/14/22 0439  AST 20 16  ALT 15 17  ALKPHOS 30* 31*  BILITOT 0.5 1.0  PROT 5.3* 5.0*  ALBUMIN 3.3* 3.0*    No results for input(s): "LIPASE", "AMYLASE" in the last 168 hours. No results for input(s): "AMMONIA" in the last 168 hours.  Coagulation Profile: Recent Labs  Lab 08/13/22 1256  INR 1.5*     Cardiac Enzymes: No results for input(s): "CKTOTAL", "CKMB", "CKMBINDEX", "TROPONINI" in the last 168 hours.  BNP (last 3  results) No results for input(s): "PROBNP" in the last 8760 hours.  Lipid Profile: No results for input(s): "CHOL", "HDL", "LDLCALC", "TRIG", "CHOLHDL", "LDLDIRECT" in the last 72 hours.  Thyroid Function Tests: Recent Labs    08/14/22 0439  TSH 0.487     Anemia Panel: Recent Labs    08/13/22 1256  VITAMINB12 302  FOLATE 19.6  FERRITIN 50  TIBC 284  IRON 71  RETICCTPCT 1.7     Urine analysis:    Component Value Date/Time   COLORURINE YELLOW 08/03/2022 1412   APPEARANCEUR CLEAR 08/03/2022 1412   LABSPEC >1.046 (H) 08/03/2022 1412   PHURINE 5.0 08/03/2022 1412   GLUCOSEU NEGATIVE 08/03/2022 1412   HGBUR NEGATIVE 08/03/2022 1412   BILIRUBINUR NEGATIVE 08/03/2022 1412   KETONESUR 20 (A) 08/03/2022 1412  PROTEINUR NEGATIVE 08/03/2022 1412   UROBILINOGEN 0.2 11/20/2013 0630   NITRITE NEGATIVE 08/03/2022 1412   LEUKOCYTESUR TRACE (A) 08/03/2022 1412    Sepsis Labs: Lactic Acid, Venous No results found for: "LATICACIDVEN"  MICROBIOLOGY: No results found for this or any previous visit (from the past 240 hour(s)).  RADIOLOGY STUDIES/RESULTS: DG Shoulder Right  Result Date: 08/13/2022 CLINICAL DATA:  Right shoulder pain, initial encounter EXAM: RIGHT SHOULDER - 2+ VIEW COMPARISON:  None Available. FINDINGS: No acute fracture or dislocation is noted although evaluation is limited on this single view. Underlying bony thorax appears within normal limits. No soft tissue changes are seen. IMPRESSION: No acute abnormality is noted on this single view. Electronically Signed   By: Alcide Clever M.D.   On: 08/13/2022 20:45   CT Head Wo Contrast  Result Date: 08/13/2022 CLINICAL DATA:  Head trauma, minor (Age >= 65y); Neck trauma (Age >= 65y). Unwitnessed fall. EXAM: CT HEAD WITHOUT CONTRAST CT CERVICAL SPINE WITHOUT CONTRAST TECHNIQUE: Multidetector CT imaging of the head and cervical spine was performed following the standard protocol without intravenous contrast. Multiplanar  CT image reconstructions of the cervical spine were also generated. RADIATION DOSE REDUCTION: This exam was performed according to the departmental dose-optimization program which includes automated exposure control, adjustment of the mA and/or kV according to patient size and/or use of iterative reconstruction technique. COMPARISON:  Head CT 08/02/2022.  MRI thoracic spine 12/15/2020. FINDINGS: CT HEAD FINDINGS Brain: No acute hemorrhage. Old subcortical infarcts in the left frontal and parietal lobes. Gray-white differentiation is otherwise preserved. No hydrocephalus or extra-axial collection. No mass effect or midline shift. Vascular: No hyperdense vessel or unexpected calcification. Skull: No calvarial fracture or suspicious bone lesion. Skull base is unremarkable. Sinuses/Orbits: Unremarkable. Other: None. CT CERVICAL SPINE FINDINGS Alignment: 3 mm degenerative anterolisthesis of C4 on C5. No traumatic malalignment. Skull base and vertebrae: Unchanged old superior endplate compression of T3. No acute fracture. Soft tissues and spinal canal: No prevertebral fluid or swelling. No visible canal hematoma. Disc levels: Mild cervical spondylosis without high-grade spinal canal stenosis. Upper chest: Unremarkable. Other: Atherosclerotic calcifications of the carotid bulbs. IMPRESSION: 1. No acute intracranial abnormality. 2. No acute cervical spine fracture or traumatic malalignment. 3. Old subcortical infarcts in the left frontal and parietal lobes. Electronically Signed   By: Orvan Falconer M.D.   On: 08/13/2022 15:21   CT Cervical Spine Wo Contrast  Result Date: 08/13/2022 CLINICAL DATA:  Head trauma, minor (Age >= 65y); Neck trauma (Age >= 65y). Unwitnessed fall. EXAM: CT HEAD WITHOUT CONTRAST CT CERVICAL SPINE WITHOUT CONTRAST TECHNIQUE: Multidetector CT imaging of the head and cervical spine was performed following the standard protocol without intravenous contrast. Multiplanar CT image reconstructions of  the cervical spine were also generated. RADIATION DOSE REDUCTION: This exam was performed according to the departmental dose-optimization program which includes automated exposure control, adjustment of the mA and/or kV according to patient size and/or use of iterative reconstruction technique. COMPARISON:  Head CT 08/02/2022.  MRI thoracic spine 12/15/2020. FINDINGS: CT HEAD FINDINGS Brain: No acute hemorrhage. Old subcortical infarcts in the left frontal and parietal lobes. Gray-white differentiation is otherwise preserved. No hydrocephalus or extra-axial collection. No mass effect or midline shift. Vascular: No hyperdense vessel or unexpected calcification. Skull: No calvarial fracture or suspicious bone lesion. Skull base is unremarkable. Sinuses/Orbits: Unremarkable. Other: None. CT CERVICAL SPINE FINDINGS Alignment: 3 mm degenerative anterolisthesis of C4 on C5. No traumatic malalignment. Skull base and vertebrae: Unchanged old superior endplate compression of  T3. No acute fracture. Soft tissues and spinal canal: No prevertebral fluid or swelling. No visible canal hematoma. Disc levels: Mild cervical spondylosis without high-grade spinal canal stenosis. Upper chest: Unremarkable. Other: Atherosclerotic calcifications of the carotid bulbs. IMPRESSION: 1. No acute intracranial abnormality. 2. No acute cervical spine fracture or traumatic malalignment. 3. Old subcortical infarcts in the left frontal and parietal lobes. Electronically Signed   By: Orvan Falconer M.D.   On: 08/13/2022 15:21   DG Chest Port 1 View  Result Date: 08/13/2022 CLINICAL DATA:  Weakness and unwitnessed fall EXAM: PORTABLE CHEST 1 VIEW COMPARISON:  Chest radiograph dated 08/02/2022 FINDINGS: Right upper quadrant surgical clips. Normal lung volumes. No focal consolidations. No pleural effusion or pneumothorax. The heart size and mediastinal contours are within normal limits. No radiographic finding of acute displaced fracture.  IMPRESSION: No radiographic finding of acute displaced fracture. Electronically Signed   By: Agustin Cree M.D.   On: 08/13/2022 14:22     LOS: 2 days   Jeoffrey Massed, MD  [facility]    To contact the attending provider between 7A-7P or the covering provider during after hours 7P-7A, please log into the web site [email or web address] and access using universal [place] password for that web site. If you do not have the password, please call the hospital operator.  08/15/2022, 11:59 AM

## 2022-08-15 NOTE — Interval H&P Note (Signed)
History and Physical Interval Note:  08/15/2022 11:52 AM  Deanna Hill  has presented today for surgery, with the diagnosis of Melena, anemia.  The various methods of treatment have been discussed with the patient and family. After consideration of risks, benefits and other options for treatment, the patient has consented to  Procedure(s): ESOPHAGOGASTRODUODENOSCOPY (EGD) WITH PROPOFOL (N/A) as a surgical intervention.  The patient's history has been reviewed, patient examined, no change in status, stable for surgery.  I have reviewed the patient's chart and labs.  Questions were answered to the patient's satisfaction.     Freddy Jaksch

## 2022-08-15 NOTE — Anesthesia Preprocedure Evaluation (Addendum)
Anesthesia Evaluation  Patient identified by MRN, date of birth, ID band Patient awake    Reviewed: Allergy & Precautions, NPO status , Patient's Chart, lab work & pertinent test results  History of Anesthesia Complications Negative for: history of anesthetic complications  Airway Mallampati: IV  TM Distance: >3 FB Neck ROM: Full  Mouth opening: Limited Mouth Opening  Dental  (+) Teeth Intact, Dental Advisory Given   Pulmonary neg pulmonary ROS   breath sounds clear to auscultation       Cardiovascular hypertension, Pt. on medications + CAD   Rhythm:Regular  1. Left ventricular ejection fraction, by estimation, is 65 to 70%. The  left ventricle has normal function. The left ventricle has no regional  wall motion abnormalities. There is moderate concentric left ventricular  hypertrophy. Left ventricular  diastolic parameters are indeterminate.   2. Right ventricular systolic function is normal. The right ventricular  size is normal.   3. The mitral valve is degenerative. Trivial mitral valve regurgitation.  No evidence of mitral stenosis. Moderate mitral annular calcification.   4. The aortic valve is tricuspid. There is mild calcification of the  aortic valve. There is mild thickening of the aortic valve. Aortic valve  regurgitation is not visualized. Aortic valve sclerosis is present, with  no evidence of aortic valve stenosis.   5. The inferior vena cava is normal in size with <50% respiratory  variability, suggesting right atrial pressure of 8 mmHg.      Neuro/Psych  Headaches PSYCHIATRIC DISORDERS Anxiety     Parkinson's  CVA, Residual Symptoms    GI/Hepatic ,GERD  ,,  Endo/Other    Renal/GU Renal disease     Musculoskeletal   Abdominal   Peds  Hematology  (+) Blood dyscrasia, anemia Lab Results      Component                Value               Date                      WBC                      19.9 (H)             08/15/2022                HGB                      8.2 (L)             08/15/2022                HCT                      24.7 (L)            08/15/2022                MCV                      95.0                08/15/2022                PLT                      472 (H)  08/15/2022              Anesthesia Other Findings   Reproductive/Obstetrics                             Anesthesia Physical Anesthesia Plan  ASA: 3  Anesthesia Plan: MAC   Post-op Pain Management: Minimal or no pain anticipated   Induction: Intravenous  PONV Risk Score and Plan: 2 and Propofol infusion and Treatment may vary due to age or medical condition  Airway Management Planned: Nasal Cannula and Natural Airway  Additional Equipment: None  Intra-op Plan:   Post-operative Plan:   Informed Consent: I have reviewed the patients History and Physical, chart, labs and discussed the procedure including the risks, benefits and alternatives for the proposed anesthesia with the patient or authorized representative who has indicated his/her understanding and acceptance.   Patient has DNR.  Discussed DNR with patient and Continue DNR.   Dental advisory given  Plan Discussed with: CRNA  Anesthesia Plan Comments:        Anesthesia Quick Evaluation

## 2022-08-15 NOTE — Op Note (Signed)
[facility] Patient Name: Deanna Hill Procedure Date : 08/15/2022 MRN: [id number] Attending MD: Willis Modena , MD, [id number] Date of Birth: 02/17/1941 CSN: [id number] Age: 82 Admit Type: Inpatient Procedure:                Upper GI endoscopy Indications:              Acute post hemorrhagic anemia, Melena Providers:                Willis Modena, MD, Adolph Pollack, RN, Brion Aliment, Technician Referring MD:             [facility]. Medicines:                Monitored Anesthesia Care Complications:            No immediate complications. Estimated Blood Loss:     Estimated blood loss: none. Procedure:                Pre-Anesthesia Assessment:                           - Prior to the procedure, a History and Physical                            was performed, and patient medications and                            allergies were reviewed. The patient's tolerance of                            previous anesthesia was also reviewed. The risks                            and benefits of the procedure and the sedation                            options and risks were discussed with the patient.                            All questions were answered, and informed consent                            was obtained. Prior Anticoagulants: The patient has                            taken Plavix (clopidogrel), last dose was 3 days                            prior to procedure. ASA Grade Assessment: IV - A                            patient with severe systemic disease that is a  constant threat to life. After reviewing the risks                            and benefits, the patient was deemed in                            satisfactory condition to undergo the procedure.                           After obtaining informed consent, the endoscope was                            passed under direct vision. Throughout the                             procedure, the patient's blood pressure, pulse, and                            oxygen saturations were monitored continuously. The                            [id number]-[id number] ([id number]) Olympus endoscope was introduced                            through the mouth, and advanced to the duodenal                            bulb. The upper GI endoscopy was accomplished                            without difficulty. The patient tolerated the                            procedure well. Scope In: Scope Out: Findings:      The examined esophagus was normal.      Patchy mild inflammation characterized by erosions was found in the       gastric body, in the gastric antrum and in the prepyloric region of the       stomach.      Localized moderate mucosal changes characterized by congestion were       found at the pylorus.      The exam of the stomach was otherwise normal.      The duodenal bulb, first portion of the duodenum and second portion of       the duodenum were normal.      No old or fresh blood was seen to the extent of our examination. Impression:               - Normal esophagus.                           - Gastritis.                           - Congested mucosa in the pylorus.                           -  Normal duodenal bulb, first portion of the                            duodenum and second portion of the duodenum.                           - No specimens collected. Recommendation:           - Return patient to hospital ward for ongoing care.                           - Clear liquid diet today.                           - Continue present medications.                           - OK to resume clopidigrel from GI perspective.                           - PPI indefinitely.                           - Patient in no shape for colonoscopy at the                            present time.                           - Would not pursue any further GI work-up in                             absence of recurrent bleeding and recurrent anemia.                           - [facility] will follow. Procedure Code(s):        --- Professional ---                           [phone], Esophagogastroduodenoscopy, flexible,                            transoral; diagnostic, including collection of                            specimen(s) by brushing or washing, when performed                            (separate procedure) Diagnosis Code(s):        --- Professional ---                           K29.70, Gastritis, unspecified, without bleeding                           K31.89, Other diseases of stomach and duodenum  D62, Acute posthemorrhagic anemia                           K92.1, Melena (includes Hematochezia) CPT copyright 2022 [employer]. All rights reserved. The codes documented in this report are preliminary and upon coder review may  be revised to meet current compliance requirements. Willis Modena, MD 08/15/2022 12:17:16 PM This report has been signed electronically. Number of Addenda: 0

## 2022-08-15 NOTE — Progress Notes (Signed)
SLP Cancellation Note  Patient Details Name: Deanna Hill MRN: [id number] DOB: 1941/01/01   Cancelled treatment:        Pt currently in EDG. Will continue efforts.    Royce Macadamia 08/15/2022, 12:37 PM

## 2022-08-15 NOTE — Anesthesia Postprocedure Evaluation (Signed)
Anesthesia Post Note  Patient: Deanna Hill  Procedure(s) Performed: ESOPHAGOGASTRODUODENOSCOPY (EGD) WITH PROPOFOL     Patient location during evaluation: Endoscopy Anesthesia Type: MAC Level of consciousness: awake and patient cooperative Pain management: pain level controlled Vital Signs Assessment: post-procedure vital signs reviewed and stable Respiratory status: spontaneous breathing, nonlabored ventilation and respiratory function stable Cardiovascular status: stable and blood pressure returned to baseline Postop Assessment: no apparent nausea or vomiting Anesthetic complications: no   No notable events documented.  Last Vitals:  Vitals:   08/15/22 1230 08/15/22 1235  BP: (!) 136/57 (!) 136/58  Pulse: 73 75  Resp: 14 19  Temp:    SpO2: 98% 96%    Last Pain:  Vitals:   08/15/22 1230  TempSrc:   PainSc: 0-No pain                 Jontay Maston

## 2022-08-16 DIAGNOSIS — K922 Gastrointestinal hemorrhage, unspecified: Secondary | ICD-10-CM | POA: Diagnosis not present

## 2022-08-16 LAB — BASIC METABOLIC PANEL
Anion gap: 9 (ref 5–15)
BUN: 41 mg/dL — ABNORMAL HIGH (ref 8–23)
CO2: 24 mmol/L (ref 22–32)
Calcium: 8 mg/dL — ABNORMAL LOW (ref 8.9–10.3)
Chloride: 110 mmol/L (ref 98–111)
Creatinine, Ser: 1.07 mg/dL — ABNORMAL HIGH (ref 0.44–1.00)
GFR, Estimated: 52 mL/min — ABNORMAL LOW (ref >60)
Glucose, Bld: 101 mg/dL — ABNORMAL HIGH (ref 70–99)
Potassium: 3.7 mmol/L (ref 3.5–5.1)
Sodium: 143 mmol/L (ref 135–145)

## 2022-08-16 LAB — CBC
HCT: 23.6 % — ABNORMAL LOW (ref 36.0–46.0)
Hemoglobin: 7.7 g/dL — ABNORMAL LOW (ref 12.0–15.0)
MCH: 31.6 pg (ref 26.0–34.0)
MCHC: 32.6 g/dL (ref 30.0–36.0)
MCV: 96.7 fL (ref 80.0–100.0)
Platelets: 461 10*3/uL — ABNORMAL HIGH (ref 150–400)
RBC: 2.44 MIL/uL — ABNORMAL LOW (ref 3.87–5.11)
RDW: 16.3 % — ABNORMAL HIGH (ref 11.5–15.5)
WBC: 15.3 10*3/uL — ABNORMAL HIGH (ref 4.0–10.5)
nRBC: 0 % (ref 0.0–0.2)

## 2022-08-16 LAB — GLUCOSE, CAPILLARY: Glucose-Capillary: 96 mg/dL (ref 70–99)

## 2022-08-16 NOTE — Progress Notes (Signed)
PROGRESS NOTE        PATIENT DETAILS Name: Deanna Hill Age: 82 y.o. Sex: female Date of Birth: September 26, 1940 Admit Date: 08/13/2022 Admitting Physician Merlene Laughter, DO ZOX:[id number], Grayling Congress, MD  Brief Summary: Patient is a 82 y.o.  female who was recently hospitalized for acute CVA-discharged on DAPT-presented with fall/weakness-in the setting of upper GI bleed and acute blood loss anemia.  See below for further details.    Significant events: 5/19-5/20>> hospitalization for acute CVA-discharged on aspirin/Plavix 5/30>> admit to [facility]-fall/weakness-melanotic stools-upper GI bleed.  Significant studies: 5/30>> CXR: No PNA 5/30>> CT head: No acute intracranial abnormality 5/30>> CT C-spine: No fracture/dislocation 5/30>> x-ray right shoulder: No fracture 6/1 EGD -  Impression:               - Normal esophagus.                           - Gastritis.                           - Congested mucosa in the pylorus.                           - Normal duodenal bulb, first portion of the                            duodenum and second portion of the duodenum.                           - No specimens collected. Recommendation:           - Return patient to hospital ward for ongoing care.                           - Clear liquid diet today.                           - Continue present medications.                           - OK to resume clopidigrel from GI perspective.                           - PPI indefinitely.                           - Patient in no shape for colonoscopy at the                            present time.                           - Would not pursue any further GI work-up in                            absence of recurrent bleeding and   Significant  microbiology data: None  Procedures: None  Consults: GI  Subjective: Lying comfortably in bed-no major issues.  Claims she did not have a BM yesterday.  Objective: Vitals: Blood  pressure (!) 162/64, pulse 86, temperature 98.3 F (36.8 C), temperature source Axillary, resp. rate 15, height 5\' 9"  (1.753 m), weight 58.3 kg, SpO2 98 %.   Exam:  Awake Alert, No new F.N deficits, Normal affect .AT,PERRAL Supple Neck, No JVD,   Symmetrical Chest wall movement, Good air movement bilaterally, CTAB RRR,No Gallops, Rubs or new Murmurs,  +ve B.Sounds, Abd Soft, No tenderness,   No Cyanosis, Clubbing or edema    Assessment/Plan:  UGI bleed with acute blood loss anemia No further melena since admission Hb stable 2 units of PRBC on 5/30 PPI infusion Follow CBC every 12 hours EGD done by [facility] on 08/15/2022 showed mild gastritis otherwise unremarkable, continue to monitor on twice daily PPI to be continued indefinitely with outpatient urology follow-up.  No further GI workup if no further bleeding per GI.  Continue Plavix.  Aspirin to be avoided.  A AKI Mild-likely hemodynamically mediated Improving with supportive care Follow lytes.  Recent CVA Now Plavix only as in #1 above. Statin Appears to have mild left-sided weakness-likely secondary to recent CVA Continue PT/OT  Parkinson's disease Sinemet  HTN Stable-continue metoprolol  HLD Statin  Parkinson's disease Sinemet  Hypothyroidism Synthroid  Essential thrombocytosis Leukocytosis Platelet count at baseline More leukocytosis than usual-no evidence of infection at this point-possibly reactive-thankfully downtrending-continue to monitor off antibiotics. Continue Hydrea  Debility/deconditioning PT/OT eval-SNF recommended.  Underweight: Estimated body mass index is 18.98 kg/m as calculated from the following:   Height as of this encounter: 5\' 9"  (1.753 m).   Weight as of this encounter: 58.3 kg.   Code status:   Code Status: DNR   DVT Prophylaxis: SCDs Start: 08/13/22 1948   Family Communication: Daughter-Dawn [phone]-updated over the phone on 5/31   Disposition Plan: Status  is: Inpatient Remains inpatient appropriate because: Severity of illness   Planned Discharge Destination:Home health   Diet: Diet Order             DIET SOFT Room service appropriate? Yes; Fluid consistency: Thin  Diet effective now                     Antimicrobial agents: Anti-infectives (From admission, onward)    None        MEDICATIONS: Scheduled Meds:  carbidopa-levodopa  1 tablet Oral q1600   carbidopa-levodopa  2 tablet Oral 2 times per day   cholecalciferol  1,000 Units Oral Daily   clopidogrel  75 mg Oral Daily   hydroxyurea  500 mg Oral Q M,W,F   levothyroxine  88 mcg Oral Q0600   metoprolol tartrate  25 mg Oral BID   [START ON 08/17/2022] pantoprazole  40 mg Intravenous Q12H   rosuvastatin  10 mg Oral Daily   Continuous Infusions:  lactated ringers Stopped (08/15/22 1330)   pantoprazole 8 mg/hr (08/15/22 1900)   PRN Meds:.acetaminophen **OR** acetaminophen, albuterol, bisacodyl, hydrALAZINE, morphine injection, ondansetron (ZOFRAN) IV, oxyCODONE, senna-docusate   I have personally reviewed following labs and imaging studies  LABORATORY DATA:  Recent Labs  Lab 08/14/22 0439 08/14/22 1641 08/15/22 0537 08/15/22 1756 08/16/22 0555  WBC 23.6* 22.5* 19.9* 18.4* 15.3*  HGB 8.9* 8.2* 8.2* 7.7* 7.7*  HCT 26.2* 25.0* 24.7* 23.9* 23.6*  PLT 600* 487* 472* 452* 461*  MCV 90.7 94.3 95.0 96.8 96.7  MCH 30.8  30.9 31.5 31.2 31.6  MCHC 34.0 32.8 33.2 32.2 32.6  RDW 16.2* 16.8* 16.8* 16.5* 16.3*  LYMPHSABS 0.6*  --   --   --   --   MONOABS 1.8*  --   --   --   --   EOSABS 0.0  --   --   --   --   BASOSABS 0.1  --   --   --   --     Recent Labs  Lab 08/13/22 1256 08/14/22 0439 08/15/22 0537 08/16/22 0555  NA 142 141 142 143  K 4.6 4.2 3.9 3.7  CL 109 110 111 110  CO2 17* 23 25 24   ANIONGAP 16* 8 6 9   GLUCOSE 184* 136* 105* 101*  BUN 100* 80* 57* 41*  CREATININE 1.33* 1.23* 1.11* 1.07*  AST 20 16  --   --   ALT 15 17  --   --   ALKPHOS  30* 31*  --   --   BILITOT 0.5 1.0  --   --   ALBUMIN 3.3* 3.0*  --   --   INR 1.5*  --   --   --   TSH  --  0.487  --   --   MG  --  2.0  --   --   CALCIUM 8.6* 8.2* 7.9* 8.0*   Anemia Panel: Recent Labs    08/13/22 1256  VITAMINB12 302  FOLATE 19.6  FERRITIN 50  TIBC 284  IRON 71  RETICCTPCT 1.7   MICROBIOLOGY: No results found for this or any previous visit (from the past 240 hour(s)).  RADIOLOGY STUDIES/RESULTS: No results found.   LOS: 3 days   Signature  -    Susa Raring M.D on 08/16/2022 at 10:39 AM   -  To page go to [email or web address]

## 2022-08-16 NOTE — Progress Notes (Signed)
Subjective: No further melena.  Objective: Vital signs in last 24 hours: Temp:  [97.5 F (36.4 C)-98.4 F (36.9 C)] 98.3 F (36.8 C) (06/02 0800) Pulse Rate:  [73-86] 86 (06/01 1850) Resp:  [12-19] 15 (06/01 1850) BP: (111-162)/(51-80) 162/64 (06/02 0800) SpO2:  [92 %-98 %] 92 % (06/02 1100) Weight:  [58.3 kg] 58.3 kg (06/02 0329) Weight change: 0 kg Last BM Date : 08/13/22  PE: GEN:  Awake, pale, NAD NEURO:  Alert, conversant  Lab Results: CBC    Component Value Date/Time   WBC 15.3 (H) 08/16/2022 0555   RBC 2.44 (L) 08/16/2022 0555   HGB 7.7 (L) 08/16/2022 0555   HGB 12.5 05/04/2022 1423   HGB 12.1 01/18/2017 1029   HCT 23.6 (L) 08/16/2022 0555   HCT 39.1 01/18/2017 1029   PLT 461 (H) 08/16/2022 0555   PLT 684 (H) 05/04/2022 1423   PLT 464 (H) 01/18/2017 1029   MCV 96.7 08/16/2022 0555   MCV 97.3 01/18/2017 1029   MCH 31.6 08/16/2022 0555   MCHC 32.6 08/16/2022 0555   RDW 16.3 (H) 08/16/2022 0555   RDW 14.4 01/18/2017 1029   LYMPHSABS 0.6 (L) 08/14/2022 0439   LYMPHSABS 1.0 01/18/2017 1029   MONOABS 1.8 (H) 08/14/2022 0439   MONOABS 0.5 01/18/2017 1029   EOSABS 0.0 08/14/2022 0439   EOSABS 1.4 (H) 01/18/2017 1029   EOSABS 1.1 (H) 01/15/2010 1030   BASOSABS 0.1 08/14/2022 0439   BASOSABS 0.1 01/18/2017 1029  CMP     Component Value Date/Time   NA 143 08/16/2022 0555   NA 140 01/18/2017 1029   K 3.7 08/16/2022 0555   K 4.1 01/18/2017 1029   CL 110 08/16/2022 0555   CL 107 06/24/2012 0859   CO2 24 08/16/2022 0555   CO2 25 01/18/2017 1029   GLUCOSE 101 (H) 08/16/2022 0555   GLUCOSE 109 01/18/2017 1029   GLUCOSE 81 06/24/2012 0859   BUN 41 (H) 08/16/2022 0555   BUN 21.7 01/18/2017 1029   CREATININE 1.07 (H) 08/16/2022 0555   CREATININE 1.04 (H) 05/04/2022 1423   CREATININE 1.0 01/18/2017 1029   CALCIUM 8.0 (L) 08/16/2022 0555   CALCIUM 9.3 01/18/2017 1029   PROT 5.0 (L) 08/14/2022 0439   PROT 7.1 01/18/2017 1029   ALBUMIN 3.0 (L) 08/14/2022 0439    ALBUMIN 4.1 01/18/2017 1029   AST 16 08/14/2022 0439   AST 11 (L) 05/04/2022 1423   AST 14 01/18/2017 1029   ALT 17 08/14/2022 0439   ALT <5 05/04/2022 1423   ALT 11 01/18/2017 1029   ALKPHOS 31 (L) 08/14/2022 0439   ALKPHOS 54 01/18/2017 1029   BILITOT 1.0 08/14/2022 0439   BILITOT 0.4 05/04/2022 1423   BILITOT 0.31 01/18/2017 1029   GFRNONAA 52 (L) 08/16/2022 0555   GFRNONAA 54 (L) 05/04/2022 1423   GFRAA >60 02/07/2017 0640   Assessment:   Melena. Acute blood loss anemia. Recent stroke, on clopidigrel.  Plan:   OK to resume clopidigrel from GI perspective. Follow CBCs. When/if recurrent melena/anemia, would consider capsule endoscopy as next best step in management; if no further melena/worsening anemia, would not do any further GI testing at this time. PPI (40 mg po qd pantoprazole, or equivalent) indefinitely. Advance diet slowly. No further GI work-up/intervention planned at the present time; [facility] will sign-off; patient already has follow-up scheduled with Dr. Ewing Schlein on June 20th; please call back with any further questions; thank you for the consultation.   Freddy Jaksch 08/16/2022, 12:01  PM   Cell [phone] If no answer or after 5 PM call [phone]

## 2022-08-17 ENCOUNTER — Encounter (HOSPITAL_COMMUNITY): Payer: Self-pay | Admitting: Gastroenterology

## 2022-08-17 DIAGNOSIS — K922 Gastrointestinal hemorrhage, unspecified: Secondary | ICD-10-CM | POA: Diagnosis not present

## 2022-08-17 LAB — CBC
HCT: 25 % — ABNORMAL LOW (ref 36.0–46.0)
Hemoglobin: 8 g/dL — ABNORMAL LOW (ref 12.0–15.0)
MCH: 30.7 pg (ref 26.0–34.0)
MCHC: 32 g/dL (ref 30.0–36.0)
MCV: 95.8 fL (ref 80.0–100.0)
Platelets: 490 10*3/uL — ABNORMAL HIGH (ref 150–400)
RBC: 2.61 MIL/uL — ABNORMAL LOW (ref 3.87–5.11)
RDW: 15.9 % — ABNORMAL HIGH (ref 11.5–15.5)
WBC: 17.7 10*3/uL — ABNORMAL HIGH (ref 4.0–10.5)
nRBC: 0 % (ref 0.0–0.2)

## 2022-08-17 LAB — GLUCOSE, CAPILLARY: Glucose-Capillary: 108 mg/dL — ABNORMAL HIGH (ref 70–99)

## 2022-08-17 MED ORDER — AMLODIPINE BESYLATE 10 MG PO TABS
10.0000 mg | ORAL_TABLET | Freq: Every day | ORAL | Status: DC
  Administered 2022-08-17 – 2022-08-18 (×2): 10 mg via ORAL
  Filled 2022-08-17 (×3): qty 1

## 2022-08-17 MED ORDER — PANTOPRAZOLE SODIUM 40 MG PO TBEC
40.0000 mg | DELAYED_RELEASE_TABLET | Freq: Two times a day (BID) | ORAL | Status: DC
  Administered 2022-08-17 – 2022-08-18 (×2): 40 mg via ORAL
  Filled 2022-08-17 (×2): qty 1

## 2022-08-17 NOTE — TOC Initial Note (Addendum)
Transition of Care [facility] - Initial/Assessment Note    Patient Details  Name: Deanna Hill MRN: [id number] Date of Birth: December 02, 1940  [facility] of [facility]) CM/SW Contact:    Mearl Latin, LCSW Phone Number: 08/17/2022, 2:28 PM  Clinical Narrative:                 9am-CSW received consult for possible SNF placement at time of discharge. CSW spoke with patient's daughter as requested. Patient's daughter reported that patient lives alone and is currently unable to care for patient at their home given patient's current physical needs and fall risk. Patient's daughter expressed understanding of PT recommendation and is agreeable to SNF placement at time of discharge. Patient's daughter reports preference for near [facility]. CSW discussed insurance authorization process and will provide Medicare SNF ratings list. CSW will send out referrals for review and provide bed offers as available. CSW submitted clinicals to [facility].   2:25pm-CSW left voicemail for patient's daughter (she is at an MD appt) to discuss SNF offers.   She returned call. Her and her husband would like [facility]. [facility] able to accept patient tomorrow if stable and insurance approval is received.   3:21pm-CSW confirmed plan for [facility] with daughter. CSW submitted for insurance authorization. Daughter requesting [facility] for transport; CSW explained insurance process for that. CSW provided her email to [facility] for paperwork ([email or web address]).  Insurance approval received: Ref# [id number], effective 08/18/2022-08/20/2022.  Skilled Nursing Rehab Facilities-   [email or web address]   Ratings out of 5 stars (5 the highest)   Name Address  Phone # Quality Care Staffing Health Inspection Overall  [facility] [address] [place] [phone] 2 1 5 4   [facility] [address] [place] [phone] 4 1 3 2   [facility] [address], [place]  [phone] 2 1 1 1   [facility] [address], [place] [phone] 4 1 3 2   [facility]  [address], [place] [phone] 3 2 5 5   [facility] [address] [facility] [phone] 2 1 2 1   [facility] [address] [place] [phone] 4 1 2 1   [facility] [address], [place] [phone] 2 4 3 3   [address] ([place]) [address] [place] [phone] 3 2 2 2   [facility] [address] [place], [place] [phone] 1 2 1 1   [facility] [address], [place] [phone] 3 1 1 1   [facility]igh[facility]ideg[facility] [address] [place] [phone] 4 3 4 4   [facility] [address], [place] [phone] 3 4 3 3           [facility] [address], [place] [phone]      [facility], [place] [address], [place] [phone] 1 1 2 1   [facility] [address] [place] [phone] 2 2 4 4   [facility] [place] [address] [phone] 2 1 4 3   [facility] [address], [place] [phone] 3 3 3 3           [address] (no [facility]) [address], [place] [phone] 4 4 5 5   [facility] (No [employer]) [address] [place], [place] [phone] 2 2 4 4   [facility] [address] [place] [phone] 2 1 2 1   [facility] (No [employer]) [address], [place] [phone] 5 5 5 5   [facility] [address], [place] [phone] 2 3  5 5  [facility] [address], [place] [phone] 2 1 1 1   [facility] [address] [place] [phone] 5 2 5 5   [facility]  [address] [place] [phone] 2 2 2 2   [facility] [address], [place] [phone] 4 2 1 1   [facility] [address] [place], [place] [phone] 2 2 3 3           [facility] [address] [place] [phone] 1 1 1 1   [facility]el[facility] [address] [place]  [phone] 2 3 3 3   [facility] (No Humana) [address] [place], [place] [phone] 2 1 3 2   [facility]) [address], [place] [phone] 1 1 1 1   [facility] [address] [place] [phone] 3 2 5 5   [facility] [place] [address], [place] [phone] 2 1 1 1           [facility] [address] [place], [place] [phone] 4 4 5 5   [facility])  [address] [place] [phone] 2 1 2 1   [facility]) [address] [place] [phone]  1 4 3   [facility] [address] [place] [phone] 3 5 4 5   [address] [place], [place] [phone] 3 2 2 2   [facility]) [address] [place] [phone] 2 1 3 2      Expected Discharge Plan: Skilled Nursing Facility Barriers to Discharge: Insurance Authorization, Continued Medical Work up   Patient Goals and CMS Choice Patient states their goals for this hospitalization and ongoing recovery are:: Rehab CMS Medicare.gov Compare Post Acute Care list provided to:: Patient Choice offered to / list presented to : Patient, Adult Children [place] ownership interest in [facility].provided to:: Patient    Expected Discharge Plan and Services In-house Referral: Clinical Social Work   Post Acute Care Choice: Skilled Nursing Facility Living arrangements for the past 2 months: Single Family Home                                      Prior Living Arrangements/Services Living arrangements for the past 2 months: Single Family Home Lives with:: Self Patient language and need for interpreter reviewed:: Yes Do you feel safe going back to the place where you live?: Yes      Need for Family Participation in Patient Care: Yes (Comment) Care giver support system in place?: Yes (comment) Current home services: DME Criminal Activity/Legal Involvement Pertinent to  Current Situation/Hospitalization: No - Comment as needed  Activities of Daily Living      Permission Sought/Granted Permission sought to share information with : Facility Medical sales representative, Family Supports Permission granted to share information with : Yes, Verbal Permission Granted  Share Information with NAME: Dawn  Permission granted to share info w AGENCY: SNFs  Permission granted to share info w Relationship: Daughter  Permission granted to share info w Contact Information: [phone]  Emotional Assessment Appearance:: Appears stated age Attitude/Demeanor/Rapport: Engaged Affect (typically observed): Accepting Orientation: : Oriented to Self, Oriented to Place, Oriented to  Time, Oriented to Situation Alcohol / Substance Use: Not Applicable Psych Involvement: No (comment)  Admission diagnosis:  Melena [K92.1] Upper GI bleeding [K92.2] Acute GI bleeding [K92.2] Thrombocytosis [D75.839] Generalized weakness [R53.1] Frequent falls [R29.6] Symptomatic anemia [D64.9] Leukocytosis, unspecified type [D72.829] Patient Active  Problem List   Diagnosis Date Noted   Upper GI bleeding 08/13/2022   Headache 08/02/2022   Hypertensive urgency 08/02/2022   Parkinson's disease 01/14/2021   Vertigo 03/01/2017   Syncope 02/04/2017   Hypokalemia 02/04/2017   UTI (urinary tract infection) 02/04/2017   Supine hypertension 01/30/2017   Aortic valve sclerosis 01/29/2017   Essential hypertension 01/29/2017   Neurogenic orthostatic hypotension (HCC) 05/22/2016   Difficulty in walking, not elsewhere classified    CVA (cerebral vascular accident) (HCC) 04/19/2016   Labile essential hypertension 04/18/2016   PRES (posterior reversible encephalopathy syndrome)    Iron deficiency anemia 10/17/2015   Essential tremor 06/19/2015   Encounter for long-term (current) use of medications 01/12/2014   Anemia due to chemotherapy 07/14/2013   Abnormal cardiovascular stress test 06/08/2013    Hypothyroidism, acquired    Hyperlipidemia    Essential thrombocytosis (HCC) 03/11/2011   PCP:  Orpha Bur, MD Pharmacy:   [facility] - [place], [place] - [address] [place] [address] Phone: [phone] Fax: [phone]  [id number] [place], [place] - [address] AT CORNER OF [facility] [address] [place] [address] Phone: [phone] Fax: [phone]     Social Determinants of Health (SDOH) Social History: SDOH Screenings   Food Insecurity: No Food Insecurity (08/03/2022)  Housing: High Risk (08/03/2022)  Transportation Needs: No Transportation Needs (08/03/2022)  Utilities: Not At Risk (08/03/2022)  Tobacco Use: Low Risk  (08/15/2022)   SDOH Interventions:     Readmission Risk Interventions     No data to display

## 2022-08-17 NOTE — Progress Notes (Signed)
PROGRESS NOTE        PATIENT DETAILS Name: Deanna Hill Age: 82 y.o. Sex: female Date of Birth: 09/11/1940 Admit Date: 08/13/2022 Admitting Physician Merlene Laughter, DO UEA:[id number], Grayling Congress, MD  Brief Summary: Patient is a 82 y.o.  female who was recently hospitalized for acute CVA-discharged on DAPT-presented with fall/weakness-in the setting of upper GI bleed and acute blood loss anemia.  See below for further details.    Significant events: 5/19-5/20>> hospitalization for acute CVA-discharged on aspirin/Plavix 5/30>> admit to [facility]-fall/weakness-melanotic stools-upper GI bleed.  Significant studies: 5/30>> CXR: No PNA 5/30>> CT head: No acute intracranial abnormality 5/30>> CT C-spine: No fracture/dislocation 5/30>> x-ray right shoulder: No fracture 6/1 EGD -  Impression:               - Normal esophagus.                           - Gastritis.                           - Congested mucosa in the pylorus.                           - Normal duodenal bulb, first portion of the                            duodenum and second portion of the duodenum.                           - No specimens collected. Recommendation:           - Return patient to hospital ward for ongoing care.                           - Clear liquid diet today.                           - Continue present medications.                           - OK to resume clopidigrel from GI perspective.                           - PPI indefinitely.                           - Patient in no shape for colonoscopy at the                            present time.                           - Would not pursue any further GI work-up in                            absence of recurrent bleeding and   Significant  microbiology data: None  Procedures: None  Consults: GI  Subjective:   Patient in bed, appears comfortable, denies any headache, no fever, no chest pain or pressure, no shortness of  breath , no abdominal pain. No new focal weakness.   Objective: Vitals: Blood pressure (!) 102/42, pulse 79, temperature 98 F (36.7 C), temperature source Oral, resp. rate 16, height 5\' 9"  (1.753 m), weight 58.3 kg, SpO2 99 %.   Exam:  Awake Alert, No new F.N deficits, Normal affect [place].AT,PERRAL Supple Neck, No JVD,   Symmetrical Chest wall movement, Good air movement bilaterally, CTAB RRR,No Gallops, Rubs or new Murmurs,  +ve B.Sounds, Abd Soft, No tenderness,   No Cyanosis, Clubbing or edema    Assessment/Plan:  UGI bleed with acute blood loss anemia No further melena since admission Hb stable 2 units of PRBC on 5/30 PPI infusion Follow CBC every 12 hours EGD done by [facility] on 08/15/2022 showed mild gastritis otherwise unremarkable, continue to monitor on twice daily PPI to be continued indefinitely with outpatient urology follow-up.  No further GI workup if no further bleeding per GI.  Continue Plavix.  Aspirin to be avoided.  A AKI Mild-likely hemodynamically mediated Improving with supportive care Follow lytes.  Recent CVA Now Plavix only as in #1 above. Statin Appears to have mild left-sided weakness-likely secondary to recent CVA Continue PT/OT >> SNF  Parkinson's disease Sinemet  HTN Stable-continue metoprolol  HLD Statin  Parkinson's disease Sinemet  Hypothyroidism Synthroid  Essential thrombocytosis Leukocytosis Platelet count at baseline More leukocytosis than usual-no evidence of infection at this point-possibly reactive-thankfully downtrending-continue to monitor off antibiotics. Continue Hydrea  Debility/deconditioning PT/OT eval-SNF recommended.  Underweight: Estimated body mass index is 18.98 kg/m as calculated from the following:   Height as of this encounter: 5\' 9"  (1.753 m).   Weight as of this encounter: 58.3 kg.   Code status:   Code Status: DNR   DVT Prophylaxis: SCDs Start: 08/13/22 1948   Family Communication:  Daughter-Dawn [phone]-updated over the phone on 08/17/22( pt's RM phone)   Disposition Plan: Status is: Inpatient Remains inpatient appropriate because: Severity of illness   Planned Discharge Destination:Home health   Diet: Diet Order             DIET SOFT Room service appropriate? Yes; Fluid consistency: Thin  Diet effective now                     Antimicrobial agents: Anti-infectives (From admission, onward)    None      MEDICATIONS: Scheduled Meds:  amLODipine  10 mg Oral Daily   carbidopa-levodopa  1 tablet Oral q1600   carbidopa-levodopa  2 tablet Oral 2 times per day   cholecalciferol  1,000 Units Oral Daily   clopidogrel  75 mg Oral Daily   hydroxyurea  500 mg Oral Q M,W,F   levothyroxine  88 mcg Oral Q0600   metoprolol tartrate  25 mg Oral BID   pantoprazole  40 mg Intravenous Q12H   rosuvastatin  10 mg Oral Daily   Continuous Infusions:   PRN Meds:.acetaminophen **OR** acetaminophen, albuterol, bisacodyl, hydrALAZINE, morphine injection, ondansetron (ZOFRAN) IV, oxyCODONE, senna-docusate   I have personally reviewed following labs and imaging studies  LABORATORY DATA:  Recent Labs  Lab 08/14/22 0439 08/14/22 1641 08/15/22 0537 08/15/22 1756 08/16/22 0555 08/17/22 0613  WBC 23.6* 22.5* 19.9* 18.4* 15.3* 17.7*  HGB 8.9* 8.2* 8.2* 7.7* 7.7* 8.0*  HCT 26.2* 25.0* 24.7* 23.9* 23.6* 25.0*  PLT  600* 487* 472* 452* 461* 490*  MCV 90.7 94.3 95.0 96.8 96.7 95.8  MCH 30.8 30.9 31.5 31.2 31.6 30.7  MCHC 34.0 32.8 33.2 32.2 32.6 32.0  RDW 16.2* 16.8* 16.8* 16.5* 16.3* 15.9*  LYMPHSABS 0.6*  --   --   --   --   --   MONOABS 1.8*  --   --   --   --   --   EOSABS 0.0  --   --   --   --   --   BASOSABS 0.1  --   --   --   --   --     Recent Labs  Lab 08/13/22 1256 08/14/22 0439 08/15/22 0537 08/16/22 0555  NA 142 141 142 143  K 4.6 4.2 3.9 3.7  CL 109 110 111 110  CO2 17* 23 25 24   ANIONGAP 16* 8 6 9   GLUCOSE 184* 136* 105* 101*   BUN 100* 80* 57* 41*  CREATININE 1.33* 1.23* 1.11* 1.07*  AST 20 16  --   --   ALT 15 17  --   --   ALKPHOS 30* 31*  --   --   BILITOT 0.5 1.0  --   --   ALBUMIN 3.3* 3.0*  --   --   INR 1.5*  --   --   --   TSH  --  0.487  --   --   MG  --  2.0  --   --   CALCIUM 8.6* 8.2* 7.9* 8.0*   Anemia Panel: No results for input(s): "VITAMINB12", "FOLATE", "FERRITIN", "TIBC", "IRON", "RETICCTPCT" in the last 72 hours.  MICROBIOLOGY: No results found for this or any previous visit (from the past 240 hour(s)).  RADIOLOGY STUDIES/RESULTS: No results found.   LOS: 4 days   Signature  -    Susa Raring M.D on 08/17/2022 at 10:05 AM   -  To page go to [email or web address]

## 2022-08-17 NOTE — Plan of Care (Signed)

## 2022-08-17 NOTE — Care Management Important Message (Signed)
Important Message  Patient Details  Name: Deanna Hill MRN: [id number] Date of Birth: 1941-02-15   Medicare Important Message Given:  Yes     Dorena Bodo 08/17/2022, 3:46 PM

## 2022-08-17 NOTE — Progress Notes (Signed)
Physical Therapy Treatment Patient Details Name: Deanna Hill MRN: [id number] DOB: 1941/01/22 Today's Date: 08/17/2022   History of Present Illness Pt is 82 year old presented [facility] on  08/13/22 for fall. Pt with weakness, dark stools - upper GI bleed suspected. PMH - Anxiety, GERD, CAD, Essential Temor, HTN, HLD, Orthostatic Hypotension, Parkinson's disease, Rt parafalcine meningioma    PT Comments    Patient received in bed, she is awake, appropriate. Agrees to PT session. BP low upon arrival to room ( 102/42) Took BP and was 89/42 up sitting edge of bed. Patient reports dizziness. Therefore session limited to transfer from bed to recliner. PO taken again and was 91/42 when in chair. She required mod A for all mobility but is improving compared to initial assessment. Patient will continue to benefit from skilled PT to improve strength, and functional independence.       Recommendations for follow up therapy are one component of a multi-disciplinary discharge planning process, led by the attending physician.  Recommendations may be updated based on patient status, additional functional criteria and insurance authorization.  Follow Up Recommendations  Can patient physically be transported by private vehicle: No    Assistance Recommended at Discharge Frequent or constant Supervision/Assistance  Patient can return home with the following A lot of help with walking and/or transfers;A lot of help with bathing/dressing/bathroom;Assist for transportation   Equipment Recommendations  None recommended by PT    Recommendations for Other Services       Precautions / Restrictions Precautions Precautions: Fall Precaution Comments: Parkinson's disease Restrictions Weight Bearing Restrictions: No     Mobility  Bed Mobility Overal bed mobility: Needs Assistance Bed Mobility: Supine to Sit     Supine to sit: Min assist, HOB elevated          Transfers Overall transfer level:  Needs assistance Equipment used: None Transfers: Sit to/from Stand, Bed to chair/wheelchair/BSC Sit to Stand: Mod assist     Squat pivot transfers: Mod assist          Ambulation/Gait               General Gait Details: NT due to patient with low BP and reporting dizziness with sitting up   Stairs             Wheelchair Mobility    Modified Rankin (Stroke Patients Only)       Balance Overall balance assessment: Needs assistance Sitting-balance support: Feet supported, Bilateral upper extremity supported Sitting balance-Leahy Scale: Fair Sitting balance - Comments: forward leaning, poor posture in sitting   Standing balance support: During functional activity, Single extremity supported Standing balance-Leahy Scale: Poor                              Cognition Arousal/Alertness: Lethargic Behavior During Therapy: Flat affect Overall Cognitive Status: Within Functional Limits for tasks assessed                                          Exercises      General Comments        Pertinent Vitals/Pain Pain Assessment Pain Assessment: Faces Faces Pain Scale: Hurts a little bit Pain Location: generalized Pain Descriptors / Indicators: Discomfort Pain Intervention(s): Monitored during session, Repositioned    Home Living  Prior Function            PT Goals (current goals can now be found in the care plan section) Acute Rehab PT Goals Patient Stated Goal: get better, be independent PT Goal Formulation: With patient Time For Goal Achievement: 08/28/22 Potential to Achieve Goals: Fair Progress towards PT goals: Progressing toward goals    Frequency    Min 3X/week      PT Plan Current plan remains appropriate    Co-evaluation              AM-PAC PT "6 Clicks" Mobility   Outcome Measure  Help needed turning from your back to your side while in a flat bed without using  bedrails?: A Little Help needed moving from lying on your back to sitting on the side of a flat bed without using bedrails?: A Little Help needed moving to and from a bed to a chair (including a wheelchair)?: A Lot Help needed standing up from a chair using your arms (e.g., wheelchair or bedside chair)?: A Lot Help needed to walk in hospital room?: Total Help needed climbing 3-5 steps with a railing? : Total 6 Click Score: 12    End of Session Equipment Utilized During Treatment: Gait belt Activity Tolerance: Patient limited by fatigue;Treatment limited secondary to medical complications (Comment);Other (comment) (Low BP 89/42) Patient left: in chair;with call bell/phone within reach;with chair alarm set Nurse Communication: Mobility status PT Visit Diagnosis: Other abnormalities of gait and mobility (R26.89);History of falling (Z91.81);Muscle weakness (generalized) (M62.81);Difficulty in walking, not elsewhere classified (R26.2)     Time: 4098-1191 PT Time Calculation (min) (ACUTE ONLY): 12 min  Charges:  $Therapeutic Activity: 8-22 mins                     Bisbee Paone, PT, GCS 08/17/22,11:29 AM

## 2022-08-17 NOTE — Progress Notes (Signed)
Speech Language Pathology Treatment: Dysphagia  Patient Details Name: Deanna Hill MRN: [id number] DOB: May 29, 1940 Today's Date: 08/17/2022 Time: 4098-1191 SLP Time Calculation (min) (ACUTE ONLY): 8 min  Assessment / Plan / Recommendation Clinical Impression  Pt up in chair, hypophonic with mild dysarthria. Pt reports she is enjoying current meals and food texture. Had a spinach omelet for breakfast and tolerated this well without globus. No observed signs of aspiration or dysphagia this session. Pt currently feels that problems have resolved. SLP reviewed basic esophageal strategies beneficial for dysmotility or xerostomia. Pt verbalized understanding. No further SLP interventions at this time.   HPI HPI: Patient is an 82 y.o. female with PMH: Parkinson's disease, CVA (10 days ago) who presented to the hospital on 08/13/22 with fall at home. She was found to have an Hgb of 7.1. (was normal 10 days ago) GI consulted and EGD performed 6/1, which reported gastritis, normal esophagus, congested mucosa in the pylorus.      SLP Plan  Continue with current plan of care      Recommendations for follow up therapy are one component of a multi-disciplinary discharge planning process, led by the attending physician.  Recommendations may be updated based on patient status, additional functional criteria and insurance authorization.    Recommendations  Diet recommendations: Other(comment) (soft, thin) Liquids provided via: Straw Medication Administration:  (as toelrated) Supervision: Patient able to self feed Compensations: Slow rate;Small sips/bites Postural Changes and/or Swallow Maneuvers: Seated upright 90 degrees                  Oral care BID     Dysphagia, unspecified (R13.10)     Continue with current plan of care     Deanna Hill, Deanna Hill  08/17/2022, 12:28 PM

## 2022-08-17 NOTE — NC FL2 (Cosign Needed Addendum)
[place] MEDICAID FL2 LEVEL OF CARE FORM     IDENTIFICATION  Patient Name: Deanna Hill Birthdate: 07/09/1940 Sex: female Admission Date (Current Location): 08/13/2022  [facility] and [place] Number:  Producer, television/film/video and Address:  [facility] [place], [address] [place], [place] [address]      Provider Number: [id number]  Attending Physician Name and Address:  Leroy Sea, MD  Relative Name and Phone Number:       Current Level of Care: Hospital Recommended Level of Care: Skilled Nursing Facility Prior Approval Number:    Date Approved/Denied:   PASRR Number: [id number]  Discharge Plan: SNF    Current Diagnoses: Patient Active Problem List   Diagnosis Date Noted   Upper GI bleeding 08/13/2022   Headache 08/02/2022   Hypertensive urgency 08/02/2022   Parkinson's disease 01/14/2021   Vertigo 03/01/2017   Syncope 02/04/2017   Hypokalemia 02/04/2017   UTI (urinary tract infection) 02/04/2017   Supine hypertension 01/30/2017   Aortic valve sclerosis 01/29/2017   Essential hypertension 01/29/2017   Neurogenic orthostatic hypotension (HCC) 05/22/2016   Difficulty in walking, not elsewhere classified    CVA (cerebral vascular accident) (HCC) 04/19/2016   Labile essential hypertension 04/18/2016   PRES (posterior reversible encephalopathy syndrome)    Iron deficiency anemia 10/17/2015   Essential tremor 06/19/2015   Encounter for long-term (current) use of medications 01/12/2014   Anemia due to chemotherapy 07/14/2013   Abnormal cardiovascular stress test 06/08/2013   Hypothyroidism, acquired    Hyperlipidemia    Essential thrombocytosis (HCC) 03/11/2011    Orientation RESPIRATION BLADDER Height & Weight     Self, Time, Situation, Place  Normal Incontinent, External catheter Weight: 128 lb 8.5 oz (58.3 kg) Height:  5\' 9"  (175.3 cm)  BEHAVIORAL SYMPTOMS/MOOD NEUROLOGICAL BOWEL NUTRITION STATUS      Continent Diet (See DC  Summary)  AMBULATORY STATUS COMMUNICATION OF NEEDS Skin   Extensive Assist Verbally Normal                       Personal Care Assistance Level of Assistance  Bathing, Feeding, Dressing Bathing Assistance: Maximum assistance Feeding assistance: Limited assistance Dressing Assistance: Limited assistance     Functional Limitations Info  Sight Sight Info: Impaired        SPECIAL CARE FACTORS FREQUENCY  PT (By licensed PT), OT (By licensed OT)     PT Frequency: 5x/week OT Frequency: 5x/week            Contractures Contractures Info: Not present    Additional Factors Info  Code Status, Allergies Code Status Info: DNR Allergies Info: Iodine, Prednisone, Shellfish Allergy, Covid-19 Mrna Vacc (Moderna), Gabapentin, Meloxicam, Other, Topiramate, Zoloft (Sertraline)           Current Medications (08/17/2022):  This is the current hospital active medication list Current Facility-Administered Medications  Medication Dose Route Frequency Provider Last Rate Last Admin   acetaminophen (TYLENOL) tablet 650 mg  650 mg Oral Q6H PRN Willis Modena, MD   650 mg at 08/17/22 0418   Or   acetaminophen (TYLENOL) suppository 650 mg  650 mg Rectal Q6H PRN Willis Modena, MD       albuterol (PROVENTIL) (2.5 MG/3ML) 0.083% nebulizer solution 2.5 mg  2.5 mg Nebulization Q2H PRN Willis Modena, MD       amLODipine (NORVASC) tablet 10 mg  10 mg Oral Daily Leroy Sea, MD   10 mg at 08/17/22 0612   bisacodyl (  DULCOLAX) suppository 10 mg  10 mg Rectal Daily PRN Willis Modena, MD       carbidopa-levodopa (SINEMET IR) 25-100 MG per tablet immediate release 1 tablet  1 tablet Oral q1600 Willis Modena, MD   1 tablet at 08/16/22 1557   carbidopa-levodopa (SINEMET IR) 25-100 MG per tablet immediate release 2 tablet  2 tablet Oral 2 times per day Willis Modena, MD   2 tablet at 08/17/22 8119   cholecalciferol (VITAMIN D3) 25 MCG (1000 UNIT) tablet 1,000 Units  1,000 Units Oral Daily  Willis Modena, MD   1,000 Units at 08/17/22 0847   clopidogrel (PLAVIX) tablet 75 mg  75 mg Oral Daily Maretta Bees, MD   75 mg at 08/17/22 0848   hydrALAZINE (APRESOLINE) injection 10 mg  10 mg Intravenous Q6H PRN Willis Modena, MD   10 mg at 08/13/22 2009   hydroxyurea (HYDREA) capsule 500 mg  500 mg Oral Q M,W,F Willis Modena, MD   500 mg at 08/17/22 0851   levothyroxine (SYNTHROID) tablet 88 mcg  88 mcg Oral Q0600 Willis Modena, MD   88 mcg at 08/17/22 0612   metoprolol tartrate (LOPRESSOR) tablet 25 mg  25 mg Oral BID Willis Modena, MD   25 mg at 08/17/22 0847   morphine (PF) 2 MG/ML injection 2 mg  2 mg Intravenous Q2H PRN Willis Modena, MD   2 mg at 08/14/22 0017   ondansetron (ZOFRAN) injection 4 mg  4 mg Intravenous Q6H PRN Willis Modena, MD   4 mg at 08/14/22 1055   oxyCODONE (Oxy IR/ROXICODONE) immediate release tablet 5 mg  5 mg Oral Q4H PRN Willis Modena, MD   5 mg at 08/16/22 2010   pantoprazole (PROTONIX) injection 40 mg  40 mg Intravenous Q12H Willis Modena, MD   40 mg at 08/17/22 0848   rosuvastatin (CRESTOR) tablet 10 mg  10 mg Oral Daily Willis Modena, MD   10 mg at 08/17/22 0847   senna-docusate (Senokot-S) tablet 1 tablet  1 tablet Oral QHS PRN Willis Modena, MD         Discharge Medications: Please see discharge summary for a list of discharge medications.  Relevant Imaging Results:  Relevant Lab Results:   Additional Information SSN [id number]  Mearl Latin, LCSW

## 2022-08-18 DIAGNOSIS — Z7401 Bed confinement status: Secondary | ICD-10-CM | POA: Diagnosis not present

## 2022-08-18 DIAGNOSIS — I639 Cerebral infarction, unspecified: Secondary | ICD-10-CM | POA: Diagnosis not present

## 2022-08-18 DIAGNOSIS — R29898 Other symptoms and signs involving the musculoskeletal system: Secondary | ICD-10-CM | POA: Diagnosis not present

## 2022-08-18 DIAGNOSIS — M6281 Muscle weakness (generalized): Secondary | ICD-10-CM | POA: Diagnosis not present

## 2022-08-18 DIAGNOSIS — K922 Gastrointestinal hemorrhage, unspecified: Secondary | ICD-10-CM | POA: Diagnosis not present

## 2022-08-18 DIAGNOSIS — R1312 Dysphagia, oropharyngeal phase: Secondary | ICD-10-CM | POA: Diagnosis not present

## 2022-08-18 DIAGNOSIS — R58 Hemorrhage, not elsewhere classified: Secondary | ICD-10-CM | POA: Diagnosis not present

## 2022-08-18 DIAGNOSIS — N179 Acute kidney failure, unspecified: Secondary | ICD-10-CM | POA: Diagnosis not present

## 2022-08-18 DIAGNOSIS — R0989 Other specified symptoms and signs involving the circulatory and respiratory systems: Secondary | ICD-10-CM | POA: Diagnosis not present

## 2022-08-18 DIAGNOSIS — K2081 Other esophagitis with bleeding: Secondary | ICD-10-CM | POA: Diagnosis not present

## 2022-08-18 DIAGNOSIS — M5136 Other intervertebral disc degeneration, lumbar region: Secondary | ICD-10-CM | POA: Diagnosis not present

## 2022-08-18 DIAGNOSIS — D72829 Elevated white blood cell count, unspecified: Secondary | ICD-10-CM | POA: Diagnosis not present

## 2022-08-18 DIAGNOSIS — S22089D Unspecified fracture of T11-T12 vertebra, subsequent encounter for fracture with routine healing: Secondary | ICD-10-CM | POA: Diagnosis not present

## 2022-08-18 DIAGNOSIS — D649 Anemia, unspecified: Secondary | ICD-10-CM | POA: Diagnosis not present

## 2022-08-18 DIAGNOSIS — M48061 Spinal stenosis, lumbar region without neurogenic claudication: Secondary | ICD-10-CM | POA: Diagnosis not present

## 2022-08-18 DIAGNOSIS — D75839 Thrombocytosis, unspecified: Secondary | ICD-10-CM | POA: Diagnosis not present

## 2022-08-18 DIAGNOSIS — G20C Parkinsonism, unspecified: Secondary | ICD-10-CM | POA: Diagnosis not present

## 2022-08-18 DIAGNOSIS — I10 Essential (primary) hypertension: Secondary | ICD-10-CM | POA: Diagnosis not present

## 2022-08-18 DIAGNOSIS — Z743 Need for continuous supervision: Secondary | ICD-10-CM | POA: Diagnosis not present

## 2022-08-18 DIAGNOSIS — R296 Repeated falls: Secondary | ICD-10-CM | POA: Diagnosis not present

## 2022-08-18 DIAGNOSIS — R2689 Other abnormalities of gait and mobility: Secondary | ICD-10-CM | POA: Diagnosis not present

## 2022-08-18 DIAGNOSIS — M533 Sacrococcygeal disorders, not elsewhere classified: Secondary | ICD-10-CM | POA: Diagnosis not present

## 2022-08-18 DIAGNOSIS — D62 Acute posthemorrhagic anemia: Secondary | ICD-10-CM | POA: Diagnosis not present

## 2022-08-18 DIAGNOSIS — K219 Gastro-esophageal reflux disease without esophagitis: Secondary | ICD-10-CM | POA: Diagnosis not present

## 2022-08-18 DIAGNOSIS — R059 Cough, unspecified: Secondary | ICD-10-CM | POA: Diagnosis not present

## 2022-08-18 DIAGNOSIS — E785 Hyperlipidemia, unspecified: Secondary | ICD-10-CM | POA: Diagnosis not present

## 2022-08-18 DIAGNOSIS — E039 Hypothyroidism, unspecified: Secondary | ICD-10-CM | POA: Diagnosis not present

## 2022-08-18 LAB — GLUCOSE, CAPILLARY: Glucose-Capillary: 130 mg/dL — ABNORMAL HIGH (ref 70–99)

## 2022-08-18 MED ORDER — PANTOPRAZOLE SODIUM 40 MG PO TBEC: 40.0000 mg | DELAYED_RELEASE_TABLET | Freq: Two times a day (BID) | ORAL | Status: DC

## 2022-08-18 MED ORDER — METOPROLOL TARTRATE 25 MG PO TABS: 25.0000 mg | ORAL_TABLET | Freq: Two times a day (BID) | ORAL | Status: DC

## 2022-08-18 MED ORDER — ACETAMINOPHEN 325 MG PO TABS: 650.0000 mg | ORAL_TABLET | Freq: Four times a day (QID) | ORAL | Status: DC | PRN

## 2022-08-18 MED ORDER — AMLODIPINE BESYLATE 10 MG PO TABS: 10.0000 mg | ORAL_TABLET | Freq: Every day | ORAL | Status: DC

## 2022-08-18 MED ORDER — TRAMADOL HCL 50 MG PO TABS: 50.0000 mg | ORAL_TABLET | Freq: Four times a day (QID) | ORAL | Status: DC | PRN

## 2022-08-18 NOTE — Discharge Instructions (Signed)
Follow with Primary MD Orpha Bur, MD in 7 days   Get CBC, CMP, 2 view Chest X ray -  checked next visit with your primary MD or SNF MD   Activity: As tolerated with Full fall precautions use walker/cane & assistance as needed  Disposition Home    Diet: Soft diet with feeding assistance and aspiration precautions.  Special Instructions: If you have smoked or chewed Tobacco  in the last 2 yrs please stop smoking, stop any regular Alcohol  and or any Recreational drug use.  On your next visit with your primary care physician please Get Medicines reviewed and adjusted.  Please request your Prim.MD to go over all Hospital Tests and Procedure/Radiological results at the follow up, please get all Hospital records sent to your Prim MD by signing hospital release before you go home.  If you experience worsening of your admission symptoms, develop shortness of breath, life threatening emergency, suicidal or homicidal thoughts you must seek medical attention immediately by calling 911 or calling your MD immediately  if symptoms less severe.  You Must read complete instructions/literature along with all the possible adverse reactions/side effects for all the Medicines you take and that have been prescribed to you. Take any new Medicines after you have completely understood and accpet all the possible adverse reactions/side effects.

## 2022-08-18 NOTE — Discharge Summary (Signed)
Deanna Hill ZOX:[id number] DOB: March 11, 1941 DOA: 08/13/2022  PCP: Orpha Bur, MD  Admit date: 08/13/2022  Discharge date: 08/18/2022  Admitted From: Home   Disposition:  SNF   Recommendations for Outpatient Follow-up:   Follow up with PCP in 1-2 weeks  PCP Please obtain BMP/CBC, 2 view CXR in 1week,  (see Discharge instructions)   PCP Please follow up on the following pending results:    Home Health: None   Equipment/Devices: None  Consultations: GI Discharge Condition: Stable    CODE STATUS: DNR Diet Recommendation: Soft diet with feeding assistance and aspiration precautions.    Chief Complaint  Patient presents with   Fall     Brief history of present illness from the day of admission and additional interim summary    82 y.o.  female who was recently hospitalized for acute CVA-discharged on DAPT-presented with fall/weakness-in the setting of upper GI bleed and acute blood loss anemia.  See below for further details.     Significant events: 5/19-5/20>> hospitalization for acute CVA-discharged on aspirin/Plavix 5/30>> admit to [facility]-fall/weakness-melanotic stools-upper GI bleed.   Significant studies: 5/30>> CXR: No PNA 5/30>> CT head: No acute intracranial abnormality 5/30>> CT C-spine: No fracture/dislocation 5/30>> x-ray right shoulder: No fracture 6/1 EGD -   Impression:               - Normal esophagus.                           - Gastritis.                           - Congested mucosa in the pylorus.                           - Normal duodenal bulb, first portion of the                            duodenum and second portion of the duodenum.                           - No specimens collected. Recommendation:           - Return patient to hospital ward for ongoing care.                            - Clear liquid diet today.                           - Continue present medications.                           - OK to resume clopidigrel from GI perspective.                           -  PPI indefinitely.                           - Patient in no shape for colonoscopy at the                            present time.                           - Would not pursue any further GI work-up in                            absence of recurrent bleeding and                                                                  Hospital Course   UGI bleed with acute blood loss anemia No further melena since admission Hb stable 2 units of PRBC on 5/30 PPI infusion Follow CBC every 12 hours EGD done by [facility] on 08/15/2022 showed mild gastritis otherwise unremarkable, continue to monitor on twice daily PPI to be continued indefinitely with outpatient urology follow-up.  No further GI workup if no further bleeding per GI.  Continue Plavix.  Aspirin to be avoided.  Placed on PPI, post discharge outpatient follow-up with [facility] in 2 to 3 weeks.    AKI Mild-likely hemodynamically mediated Hydration   Recent CVA Now Plavix only as in #1 above.  Long with statin for secondary prevention, PT OT and SNF.  Continues to have mild residual left-sided weakness.    Parkinson's disease Sinemet   HTN Stable-continue metoprolol, added low-dose Norvasc.  Monitor and adjust.   HLD Statin   Parkinson's disease Sinemet   Hypothyroidism Synthroid   Essential thrombocytosis Leukocytosis Platelet count at baseline More leukocytosis than usual-no evidence of infection at this point-possibly reactive-thankfully downtrending-continue to monitor off antibiotics. Continue Hydrea   Debility/deconditioning, malnourishment with moderate protein calorie malnutrition.  Continue protein supplementation at SNF, PT OT, will require SNF.    Discharge diagnosis     Principal Problem:   Upper GI  bleeding    Discharge instructions    Discharge Instructions     Discharge instructions   Complete by: As directed    Follow with Primary MD Orpha Bur, MD in 7 days   Get CBC, CMP, 2 view Chest X ray -  checked next visit with your primary MD or SNF MD   Activity: As tolerated with Full fall precautions use walker/cane & assistance as needed  Disposition Home    Diet: Soft diet with feeding assistance and aspiration precautions.  Special Instructions: If you have smoked or chewed Tobacco  in the last 2 yrs please stop smoking, stop any regular Alcohol  and or any Recreational drug use.  On your next visit with your primary care physician please Get Medicines reviewed and adjusted.  Please request your Prim.MD to go over all Hospital Tests and Procedure/Radiological results at the follow up, please get all Hospital records sent to your Prim MD by signing hospital release before you go home.  If you experience worsening of  your admission symptoms, develop shortness of breath, life threatening emergency, suicidal or homicidal thoughts you must seek medical attention immediately by calling 911 or calling your MD immediately  if symptoms less severe.  You Must read complete instructions/literature along with all the possible adverse reactions/side effects for all the Medicines you take and that have been prescribed to you. Take any new Medicines after you have completely understood and accpet all the possible adverse reactions/side effects.   Increase activity slowly   Complete by: As directed        Discharge Medications   Allergies as of 08/18/2022       Reactions   Iodine Shortness Of Breath, Other (See Comments)   Pt states "Deadly"   Prednisone    Facial flushing and swelling   Shellfish Allergy Anaphylaxis, Other (See Comments)   " I almost died once"   Covid-19 Mrna Vacc (moderna) Other (See Comments)   unknown   Gabapentin Other (See Comments)   unknown    Meloxicam Other (See Comments)   unknown   Other Other (See Comments)   unknown   Topiramate Other (See Comments)   unknown   Zoloft [sertraline] Other (See Comments)   unknown        Medication List     STOP taking these medications    aspirin EC 81 MG tablet   hydrALAZINE 25 MG tablet Commonly known as: APRESOLINE   losartan 25 MG tablet Commonly known as: COZAAR       TAKE these medications    acetaminophen 325 MG tablet Commonly known as: TYLENOL Take 2 tablets (650 mg total) by mouth every 6 (six) hours as needed for mild pain or moderate pain (or Fever >/= 101). What changed:  medication strength how much to take reasons to take this   amLODipine 10 MG tablet Commonly known as: NORVASC Take 1 tablet (10 mg total) by mouth daily.   BIOTIN PO Take 1 capsule by mouth daily.   carbidopa-levodopa 25-100 MG tablet Commonly known as: SINEMET IR Take 2 at 7am/2 at 11am/1 at 3pm What changed:  how much to take how to take this when to take this additional instructions   clopidogrel 75 MG tablet Commonly known as: PLAVIX Take 1 tablet (75 mg total) by mouth daily.   hydroxyurea 500 MG capsule Commonly known as: HYDREA TAKE ONE CAPSULE BY MOUTH THREE TIMES A WEEK. MAY take with food. What changed: See the new instructions.   levothyroxine 88 MCG tablet Commonly known as: SYNTHROID Take 88 mcg by mouth daily.   metoprolol tartrate 25 MG tablet Commonly known as: LOPRESSOR Take 1 tablet (25 mg total) by mouth 2 (two) times daily.   nitroGLYCERIN 0.4 MG SL tablet Commonly known as: NITROSTAT Place 1 tablet (0.4 mg total) under the tongue every 5 (five) minutes as needed for chest pain.   pantoprazole 40 MG tablet Commonly known as: PROTONIX Take 1 tablet (40 mg total) by mouth 2 (two) times daily.   PROBIOTIC PO Take 1 tablet by mouth daily.   rosuvastatin 10 MG tablet Commonly known as: CRESTOR Take 1 tablet (10 mg total) by mouth daily.    Vitamin D 50 MCG (2000 UT) Caps Take 1 capsule by mouth daily.         Contact information for follow-up providers     Orpha Bur, MD. Schedule an appointment as soon as possible for a visit in 1 week(s).   Specialty: Family Medicine Contact information: [address] [place] [address] [phone]         [facility]. Schedule an appointment as soon as possible for a visit in 1 week(s).   Contact information: [address]     [address] [place] [address] [phone]        Kathi Der, MD. Schedule an appointment as soon as possible for a visit in 2 week(s).   Specialty: Gastroenterology Contact information: [address] [place] [address] [phone]              Contact information for after-discharge care     Destination     [facility] Preferred SNF .   Service: Skilled Nursing Contact information: [address] [place] [address] [phone]                     Major procedures and Radiology Reports - PLEASE review detailed and final reports thoroughly  -      DG Shoulder Right  Result Date: 08/13/2022 CLINICAL DATA:  Right shoulder pain, initial encounter EXAM: RIGHT SHOULDER - 2+ VIEW COMPARISON:  None Available. FINDINGS: No acute fracture or dislocation is noted although evaluation is limited on this single view. Underlying bony thorax appears within normal limits. No soft tissue changes are seen. IMPRESSION: No acute abnormality is noted on this single view. Electronically Signed   By: Alcide Clever M.D.   On: 08/13/2022 20:45   CT Head Wo Contrast  Result Date: 08/13/2022 CLINICAL DATA:  Head trauma, minor (Age >= 65y); Neck trauma (Age >= 65y). Unwitnessed fall. EXAM: CT HEAD WITHOUT CONTRAST CT CERVICAL SPINE WITHOUT CONTRAST TECHNIQUE: Multidetector CT imaging of the head and cervical spine was performed  following the standard protocol without intravenous contrast. Multiplanar CT image reconstructions of the cervical spine were also generated. RADIATION DOSE REDUCTION: This exam was performed according to the departmental dose-optimization program which includes automated exposure control, adjustment of the mA and/or kV according to patient size and/or use of iterative reconstruction technique. COMPARISON:  Head CT 08/02/2022.  MRI thoracic spine 12/15/2020. FINDINGS: CT HEAD FINDINGS Brain: No acute hemorrhage. Old subcortical infarcts in the left frontal and parietal lobes. Gray-white differentiation is otherwise preserved. No hydrocephalus or extra-axial collection. No mass effect or midline shift. Vascular: No hyperdense vessel or unexpected calcification. Skull: No calvarial fracture or suspicious bone lesion. Skull base is unremarkable. Sinuses/Orbits: Unremarkable. Other: None. CT CERVICAL SPINE FINDINGS Alignment: 3 mm degenerative anterolisthesis of C4 on C5. No traumatic malalignment. Skull base and vertebrae: Unchanged old superior endplate compression of T3. No acute fracture. Soft tissues and spinal canal: No prevertebral fluid or swelling. No visible canal hematoma. Disc levels: Mild cervical spondylosis without high-grade spinal canal stenosis. Upper chest: Unremarkable. Other: Atherosclerotic calcifications of the carotid bulbs. IMPRESSION: 1. No acute intracranial abnormality. 2. No acute cervical spine fracture or traumatic malalignment. 3. Old subcortical infarcts in the left frontal and parietal lobes. Electronically Signed   By: Orvan Falconer M.D.   On: 08/13/2022 15:21   CT Cervical Spine Wo Contrast  Result Date: 08/13/2022 CLINICAL DATA:  Head trauma, minor (Age >= 65y); Neck trauma (Age >= 65y). Unwitnessed fall. EXAM: CT HEAD WITHOUT CONTRAST CT CERVICAL SPINE WITHOUT CONTRAST TECHNIQUE: Multidetector CT imaging of the head and cervical spine was performed following the standard  protocol without intravenous contrast. Multiplanar CT image reconstructions of the cervical spine were also generated. RADIATION DOSE REDUCTION: This exam was  performed according to the departmental dose-optimization program which includes automated exposure control, adjustment of the mA and/or kV according to patient size and/or use of iterative reconstruction technique. COMPARISON:  Head CT 08/02/2022.  MRI thoracic spine 12/15/2020. FINDINGS: CT HEAD FINDINGS Brain: No acute hemorrhage. Old subcortical infarcts in the left frontal and parietal lobes. Gray-white differentiation is otherwise preserved. No hydrocephalus or extra-axial collection. No mass effect or midline shift. Vascular: No hyperdense vessel or unexpected calcification. Skull: No calvarial fracture or suspicious bone lesion. Skull base is unremarkable. Sinuses/Orbits: Unremarkable. Other: None. CT CERVICAL SPINE FINDINGS Alignment: 3 mm degenerative anterolisthesis of C4 on C5. No traumatic malalignment. Skull base and vertebrae: Unchanged old superior endplate compression of T3. No acute fracture. Soft tissues and spinal canal: No prevertebral fluid or swelling. No visible canal hematoma. Disc levels: Mild cervical spondylosis without high-grade spinal canal stenosis. Upper chest: Unremarkable. Other: Atherosclerotic calcifications of the carotid bulbs. IMPRESSION: 1. No acute intracranial abnormality. 2. No acute cervical spine fracture or traumatic malalignment. 3. Old subcortical infarcts in the left frontal and parietal lobes. Electronically Signed   By: Orvan Falconer M.D.   On: 08/13/2022 15:21   DG Chest Port 1 View  Result Date: 08/13/2022 CLINICAL DATA:  Weakness and unwitnessed fall EXAM: PORTABLE CHEST 1 VIEW COMPARISON:  Chest radiograph dated 08/02/2022 FINDINGS: Right upper quadrant surgical clips. Normal lung volumes. No focal consolidations. No pleural effusion or pneumothorax. The heart size and mediastinal contours are  within normal limits. No radiographic finding of acute displaced fracture. IMPRESSION: No radiographic finding of acute displaced fracture. Electronically Signed   By: Agustin Cree M.D.   On: 08/13/2022 14:22   ECHOCARDIOGRAM COMPLETE  Result Date: 08/03/2022    ECHOCARDIOGRAM REPORT   Patient Name:   KARLETTA PRUDEN [facility] Date of Exam: 08/03/2022 Medical Rec #:  [id number]         Height:       69.0 in Accession #:    [id number]        Weight:       123.4 lb Date of Birth:  June 13, 1940         BSA:          1.683 m Patient Age:    82 years          BP:           168/84 mmHg Patient Gender: F                 HR:           87 bpm. Exam Location:  Inpatient Procedure: 2D Echo, Cardiac Doppler and Color Doppler Indications:    Stroke  History:        Patient has prior history of Echocardiogram examinations, most                 recent 02/05/2017. Stroke, Signs/Symptoms:Syncope; Risk                 Factors:Dyslipidemia and Hypertension. Thrombocytosis,                 Hypothyroidism.  Sonographer:    Milbert Coulter Referring Phys: [id number] PRAMOD S SETHI IMPRESSIONS  1. Left ventricular ejection fraction, by estimation, is 65 to 70%. The left ventricle has normal function. The left ventricle has no regional wall motion abnormalities. There is moderate concentric left ventricular hypertrophy. Left ventricular diastolic parameters are indeterminate.  2. Right ventricular systolic function is normal. The right ventricular size is  normal.  3. The mitral valve is degenerative. Trivial mitral valve regurgitation. No evidence of mitral stenosis. Moderate mitral annular calcification.  4. The aortic valve is tricuspid. There is mild calcification of the aortic valve. There is mild thickening of the aortic valve. Aortic valve regurgitation is not visualized. Aortic valve sclerosis is present, with no evidence of aortic valve stenosis.  5. The inferior vena cava is normal in size with <50% respiratory variability, suggesting right atrial  pressure of 8 mmHg. Comparison(s): No significant change from prior study. Conclusion(s)/Recommendation(s): Normal biventricular function without evidence of hemodynamically significant valvular heart disease. No intracardiac source of embolism detected on this transthoracic study. Consider a transesophageal echocardiogram to exclude cardiac source of embolism if clinically indicated. FINDINGS  Left Ventricle: Left ventricular ejection fraction, by estimation, is 65 to 70%. The left ventricle has normal function. The left ventricle has no regional wall motion abnormalities. The left ventricular internal cavity size was normal in size. There is  moderate concentric left ventricular hypertrophy. Left ventricular diastolic parameters are indeterminate. Right Ventricle: The right ventricular size is normal. No increase in right ventricular wall thickness. Right ventricular systolic function is normal. Left Atrium: Left atrial size was normal in size. Right Atrium: Right atrial size was normal in size. Pericardium: There is no evidence of pericardial effusion. Mitral Valve: The mitral valve is degenerative in appearance. There is mild thickening of the mitral valve leaflet(s). There is mild calcification of the mitral valve leaflet(s). Moderate mitral annular calcification. Trivial mitral valve regurgitation. No evidence of mitral valve stenosis. Tricuspid Valve: The tricuspid valve is normal in structure. Tricuspid valve regurgitation is trivial. No evidence of tricuspid stenosis. Aortic Valve: The aortic valve is tricuspid. There is mild calcification of the aortic valve. There is mild thickening of the aortic valve. Aortic valve regurgitation is not visualized. Aortic valve sclerosis is present, with no evidence of aortic valve stenosis. Aortic valve mean gradient measures 4.0 mmHg. Aortic valve peak gradient measures 6.7 mmHg. Aortic valve area, by VTI measures 2.43 cm. Pulmonic Valve: The pulmonic valve was not  well visualized. Pulmonic valve regurgitation is trivial. No evidence of pulmonic stenosis. Aorta: The aortic root, ascending aorta, aortic arch and descending aorta are all structurally normal, with no evidence of dilitation or obstruction. Venous: The inferior vena cava is normal in size with less than 50% respiratory variability, suggesting right atrial pressure of 8 mmHg. IAS/Shunts: The atrial septum is grossly normal.  LEFT VENTRICLE PLAX 2D LVIDd:         3.70 cm     Diastology LVIDs:         1.90 cm     LV e' medial:    6.09 cm/s LV PW:         1.20 cm     LV E/e' medial:  15.6 LV IVS:        1.40 cm     LV e' lateral:   6.31 cm/s LVOT diam:     2.00 cm     LV E/e' lateral: 15.0 LV SV:         62 LV SV Index:   37 LVOT Area:     3.14 cm  LV Volumes (MOD) LV vol d, MOD A2C: 35.6 ml LV vol d, MOD A4C: 49.8 ml LV vol s, MOD A2C: 12.3 ml LV vol s, MOD A4C: 12.7 ml LV SV MOD A2C:     23.3 ml LV SV MOD A4C:     49.8 ml  LV SV MOD BP:      30.4 ml RIGHT VENTRICLE RV Basal diam:  2.70 cm RV Mid diam:    2.20 cm RV S prime:     18.70 cm/s TAPSE (M-mode): 2.3 cm LEFT ATRIUM             Index        RIGHT ATRIUM           Index LA diam:        4.00 cm 2.38 cm/m   RA Area:     12.20 cm LA Vol (A2C):   35.9 ml 21.33 ml/m  RA Volume:   26.60 ml  15.81 ml/m LA Vol (A4C):   47.7 ml 28.35 ml/m LA Biplane Vol: 42.9 ml 25.49 ml/m  AORTIC VALVE AV Area (Vmax):    2.43 cm AV Area (Vmean):   2.34 cm AV Area (VTI):     2.43 cm AV Vmax:           129.00 cm/s AV Vmean:          87.100 cm/s AV VTI:            0.253 m AV Peak Grad:      6.7 mmHg AV Mean Grad:      4.0 mmHg LVOT Vmax:         99.60 cm/s LVOT Vmean:        65.000 cm/s LVOT VTI:          0.196 m LVOT/AV VTI ratio: 0.77  AORTA Ao Root diam: 3.40 cm Ao Asc diam:  3.10 cm MITRAL VALVE MV Area (PHT): 3.77 cm     SHUNTS MV Decel Time: 201 msec     Systemic VTI:  0.20 m MV E velocity: 94.90 cm/s   Systemic Diam: 2.00 cm MV A velocity: 107.00 cm/s MV E/A ratio:  0.89  Jodelle Red MD Electronically signed by Jodelle Red MD Signature Date/Time: 08/03/2022/3:55:03 PM    Final    CT ANGIO HEAD NECK W WO CM  Result Date: 08/03/2022 CLINICAL DATA:  Stroke follow-up EXAM: CT ANGIOGRAPHY HEAD AND NECK WITH AND WITHOUT CONTRAST TECHNIQUE: Multidetector CT imaging of the head and neck was performed using the standard protocol during bolus administration of intravenous contrast. Multiplanar CT image reconstructions and MIPs were obtained to evaluate the vascular anatomy. Carotid stenosis measurements (when applicable) are obtained utilizing NASCET criteria, using the distal internal carotid diameter as the denominator. RADIATION DOSE REDUCTION: This exam was performed according to the departmental dose-optimization program which includes automated exposure control, adjustment of the mA and/or kV according to patient size and/or use of iterative reconstruction technique. CONTRAST:  75mL OMNIPAQUE IOHEXOL 350 MG/ML SOLN COMPARISON:  MRA Head/Neck 04/18/16 FINDINGS: CT HEAD FINDINGS Brain: Note that the presence of IV contrast limits the ability to assess for intracranial hemorrhage. Redemonstrated is a parafalcine meningioma on the right, better assessed on recent contrast-enhanced brain MRI. No CT evidence of an acute cortical infarct. Redemonstrated is chronic infarct in the left frontal lobe. Vascular: See below Skull: Normal. Negative for fracture or focal lesion. Sinuses/Orbits: No middle ear or mastoid effusion. Polypoid mucosal thickening left maxillary sinus. Bilateral lens replacement. Orbits are otherwise unremarkable. Other: None. Review of the MIP images confirms the above findings CTA NECK FINDINGS Aortic arch: Standard branching. Imaged portion shows no evidence of aneurysm or dissection. No significant stenosis of the major arch vessel origins. Right carotid system: No evidence of dissection, stenosis (50% or greater), or  occlusion. Left carotid system: No  evidence of dissection, stenosis (50% or greater), or occlusion. Vertebral arteries: There is mild somewhat irregular narrowing of the V1 segment of the left vertebral artery, which can be seen in the setting of vertebral artery dissection (see screen shot). This is new from 2018. Skeleton: Negative. Other neck: Negative. Upper chest: Negative Review of the MIP images confirms the above findings CTA HEAD FINDINGS Anterior circulation: No significant stenosis, proximal occlusion, aneurysm, or vascular malformation. There is atherosclerotic narrowing in the bilateral MCA territories, with the area of greatest narrowing being the superior terminal branch of the left MCA (see screenshot), likely unchanged from 2018. Posterior circulation: No significant stenosis, proximal occlusion, aneurysm, or vascular malformation. Venous sinuses: Patent when accounting for timing of the contrast bolus. Anatomic variants: None Review of the MIP images confirms the above findings IMPRESSION: 1. Mild irregular narrowing of the V1 segment of the left vertebral artery could be seen in the setting of dissection. 2. No intracranial large vessel occlusion. 3. No hemodynamically significant stenosis in the neck. 4. Multifocal atherosclerotic intracranial stenoses, greatest in the left MCA distribution, as above. Electronically Signed   By: Lorenza Cambridge M.D.   On: 08/03/2022 09:41   MR BRAIN W CONTRAST  Result Date: 08/03/2022 CLINICAL DATA:  Follow-up examination for possible demyelinating lesion. EXAM: MRI HEAD WITH CONTRAST TECHNIQUE: Multiplanar, multiecho pulse sequences of the brain and surrounding structures were obtained with intravenous contrast. CONTRAST:  5mL GADAVIST GADOBUTROL 1 MMOL/ML IV SOLN COMPARISON:  Prior MRI from 08/02/2022. FINDINGS: Brain: Postcontrast imaging of the brain demonstrates no appreciable enhancement about the cerebral white matter lesions to suggest active demyelination. Incidental DVA noted at the  right parietal lobe. 11 mm right parafalcine meningioma present at the right parietal lobe without mass effect. No other abnormal or pathologic enhancement. Vascular: Normal intravascular enhancement seen throughout the major intracranial vasculature. Skull and upper cervical spine: Craniocervical junction within normal limits. No focal marrow replacing lesion. No scalp soft tissue abnormality. Sinuses/Orbits: Globes and orbital soft tissues demonstrate no acute finding. Prior bilateral ocular lens replacement. Paranasal sinuses are largely clear. No significant mastoid effusion. Other: None. IMPRESSION: 1. No abnormal enhancement to suggest active demyelination. 2. 11 mm right parafalcine meningioma without mass effect. 3. Incidental DVA at the right parietal lobe. Electronically Signed   By: Rise Mu M.D.   On: 08/03/2022 00:46   MR BRAIN WO CONTRAST  Result Date: 08/02/2022 CLINICAL DATA:  Concern for PRES EXAM: MRI HEAD WITHOUT CONTRAST TECHNIQUE: Multiplanar, multiecho pulse sequences of the brain and surrounding structures were obtained without intravenous contrast. COMPARISON:  MRI head February 10, 2017. FINDINGS: Brain: New focal area of T2 hyperintensity in the right aspect of the splenium of the corpus callosum with probable faint peripheral restricted diffusion. No other areas of restricted diffusion. Remote but interval white matter infarcts in the left frontal and parietal lobes. No evidence of acute hemorrhage, a mass lesion, midline shift or hydrocephalus. Vascular: Major arterial flow voids are maintained at the skull base. Skull and upper cervical spine: Normal marrow signal. Sinuses/Orbits: Left maxillary sinus retention cyst. Otherwise, sinuses are largely clear. No acute orbital findings. Other: No mastoid effusions IMPRESSION: 1. New focal area of T2 hyperintensity in the right aspect of the splenium of the corpus callosum with probable faint peripheral restricted diffusion,  most likely an evolving subacute infarct. 2. Remote but interval white matter infarcts in the left frontal and parietal lobes. 3. Otherwise, no evidence of acute  intracranial abnormality. Electronically Signed   By: Feliberto Harts M.D.   On: 08/02/2022 17:55   CT HEAD WO CONTRAST  Result Date: 08/02/2022 CLINICAL DATA:  82 year old female with history of sudden onset of severe headache. EXAM: CT HEAD WITHOUT CONTRAST TECHNIQUE: Contiguous axial images were obtained from the base of the skull through the vertex without intravenous contrast. RADIATION DOSE REDUCTION: This exam was performed according to the departmental dose-optimization program which includes automated exposure control, adjustment of the mA and/or kV according to patient size and/or use of iterative reconstruction technique. COMPARISON:  Head CT 04/14/2019. FINDINGS: Brain: New well-defined low-attenuation in the left frontal lobe white matter (axial image 16 of series 2), compatible with an old lacunar infarct. Patchy and confluent areas of decreased attenuation are noted throughout the deep and periventricular white matter of the cerebral hemispheres bilaterally, compatible with chronic microvascular ischemic disease. 8 x 5 mm calcification along the posterior aspect of the falx cerebri to the right of midline (axial image 22 series 2), similar to prior studies, compatible with known meningioma. No evidence of acute infarction, hemorrhage, hydrocephalus, extra-axial collection or new mass lesion/mass effect. Vascular: No hyperdense vessel or unexpected calcification. Skull: Normal. Negative for fracture or focal lesion. Sinuses/Orbits: No acute finding. Other: None. IMPRESSION: 1. No acute intracranial abnormalities. 2. Mild chronic microvascular ischemic changes and old lacunar infarct in the left frontal lobe white matter (new compared to the prior study, but not acute). 3. Small parafalcine meningioma in the right parietal region,  measuring 8 x 5 mm, similar to prior examinations. Electronically Signed   By: Trudie Reed M.D.   On: 08/02/2022 13:07   DG Chest Portable 1 View  Result Date: 08/02/2022 CLINICAL DATA:  Hypertensive crisis.  Headache and dizziness. EXAM: PORTABLE CHEST 1 VIEW COMPARISON:  04/18/2016 FINDINGS: The heart size and mediastinal contours are within normal limits. Aortic atherosclerotic calcification incidentally noted. Both lungs are clear. The visualized skeletal structures are unremarkable. IMPRESSION: No active disease. Electronically Signed   By: Danae Orleans M.D.   On: 08/02/2022 12:06    Micro Results    No results found for this or any previous visit (from the past 240 hour(s)).  Today   Subjective    Shadora Heynen today has no headache,no chest abdominal pain,no new weakness tingling or numbness, feels much better wants to go home today.    Objective   Blood pressure 125/65, pulse 67, temperature 98.3 F (36.8 C), temperature source Oral, resp. rate 13, height 5\' 9"  (1.753 m), weight 59.5 kg, SpO2 96 %.   Intake/Output Summary (Last 24 hours) at 08/18/2022 0921 Last data filed at 08/17/2022 2053 Gross per 24 hour  Intake --  Output 600 ml  Net -600 ml    Exam  Awake Alert, No new F.N deficits,    [place].AT,PERRAL Supple Neck,   Symmetrical Chest wall movement, Good air movement bilaterally, CTAB RRR,No Gallops,   +ve B.Sounds, Abd Soft, Non tender,  No Cyanosis, Clubbing or edema    Data Review   Recent Labs  Lab 08/14/22 0439 08/14/22 1641 08/15/22 0537 08/15/22 1756 08/16/22 0555 08/17/22 0613  WBC 23.6* 22.5* 19.9* 18.4* 15.3* 17.7*  HGB 8.9* 8.2* 8.2* 7.7* 7.7* 8.0*  HCT 26.2* 25.0* 24.7* 23.9* 23.6* 25.0*  PLT 600* 487* 472* 452* 461* 490*  MCV 90.7 94.3 95.0 96.8 96.7 95.8  MCH 30.8 30.9 31.5 31.2 31.6 30.7  MCHC 34.0 32.8 33.2 32.2 32.6 32.0  RDW 16.2* 16.8* 16.8* 16.5*  16.3* 15.9*  LYMPHSABS 0.6*  --   --   --   --   --   MONOABS 1.8*  --   --    --   --   --   EOSABS 0.0  --   --   --   --   --   BASOSABS 0.1  --   --   --   --   --     Recent Labs  Lab 08/13/22 1256 08/14/22 0439 08/15/22 0537 08/16/22 0555  NA 142 141 142 143  K 4.6 4.2 3.9 3.7  CL 109 110 111 110  CO2 17* 23 25 24   ANIONGAP 16* 8 6 9   GLUCOSE 184* 136* 105* 101*  BUN 100* 80* 57* 41*  CREATININE 1.33* 1.23* 1.11* 1.07*  AST 20 16  --   --   ALT 15 17  --   --   ALKPHOS 30* 31*  --   --   BILITOT 0.5 1.0  --   --   ALBUMIN 3.3* 3.0*  --   --   INR 1.5*  --   --   --   TSH  --  0.487  --   --   MG  --  2.0  --   --   CALCIUM 8.6* 8.2* 7.9* 8.0*    Total Time in preparing paper work, data evaluation and todays exam - 35 minutes  Signature  -    Susa Raring M.D on 08/18/2022 at 9:21 AM   -  To page go to [email or web address]

## 2022-08-18 NOTE — Progress Notes (Signed)
Attempted to call report to [facility] facility. Spoke with receptionist but unable to reach receiving nurse. RN left name and number for call to be returned as soon as possible.

## 2022-08-18 NOTE — TOC Transition Note (Signed)
Transition of Care [facility]) - CM/SW Discharge Note   Patient Details  Name: Deanna Hill MRN: [id number] Date of Birth: November 29, 1940  [facility] CM/SW Contact:  Mearl Latin, LCSW Phone Number: 08/18/2022, 12:00 PM   Clinical Narrative:    Patient will DC to: [facility] Anticipated DC date: 08/18/22 Family notified: Daughter Transport by: Sharin Mons   Per MD patient ready for DC to [facility]. RN to call report prior to discharge [phone] room 3250). RN, patient, patient's family, and facility notified of DC. Discharge Summary and FL2 sent to facility. DC packet on chart including signed DNR and script. Ambulance transport requested for patient.   CSW will sign off for now as social work intervention is no longer needed. Please consult [place] again if new needs arise.     Final next level of care: Skilled Nursing Facility Barriers to Discharge: Barriers Resolved   Patient Goals and CMS Choice CMS Medicare.gov Compare Post Acute Care list provided to:: Patient Choice offered to / list presented to : Patient, Adult Children  Discharge Placement     Existing PASRR number confirmed : 08/18/22          Patient chooses bed at: [facility] Patient to be transferred to facility by: [facility] Name of family member notified: Daughter Patient and family notified of of transfer: 08/18/22  Discharge Plan and Services Additional resources added to the After Visit Summary for   In-house Referral: Clinical Social Work   Post Acute Care Choice: Skilled Nursing Facility                               Social Determinants of Health (SDOH) Interventions SDOH Screenings   Food Insecurity: No Food Insecurity (08/03/2022)  Housing: High Risk (08/03/2022)  Transportation Needs: No Transportation Needs (08/03/2022)  Utilities: Not At Risk (08/03/2022)  Tobacco Use: Low Risk  (08/17/2022)     Readmission Risk Interventions     No data to display

## 2022-08-20 DIAGNOSIS — E785 Hyperlipidemia, unspecified: Secondary | ICD-10-CM | POA: Diagnosis not present

## 2022-08-20 DIAGNOSIS — D72829 Elevated white blood cell count, unspecified: Secondary | ICD-10-CM | POA: Diagnosis not present

## 2022-08-20 DIAGNOSIS — E039 Hypothyroidism, unspecified: Secondary | ICD-10-CM | POA: Diagnosis not present

## 2022-08-20 DIAGNOSIS — I10 Essential (primary) hypertension: Secondary | ICD-10-CM | POA: Diagnosis not present

## 2022-08-20 DIAGNOSIS — D75839 Thrombocytosis, unspecified: Secondary | ICD-10-CM | POA: Diagnosis not present

## 2022-08-21 DIAGNOSIS — K922 Gastrointestinal hemorrhage, unspecified: Secondary | ICD-10-CM | POA: Diagnosis not present

## 2022-08-21 DIAGNOSIS — I639 Cerebral infarction, unspecified: Secondary | ICD-10-CM | POA: Diagnosis not present

## 2022-08-21 DIAGNOSIS — N179 Acute kidney failure, unspecified: Secondary | ICD-10-CM | POA: Diagnosis not present

## 2022-09-03 DIAGNOSIS — I10 Essential (primary) hypertension: Secondary | ICD-10-CM | POA: Diagnosis not present

## 2022-09-03 DIAGNOSIS — K219 Gastro-esophageal reflux disease without esophagitis: Secondary | ICD-10-CM | POA: Diagnosis not present

## 2022-09-03 DIAGNOSIS — R296 Repeated falls: Secondary | ICD-10-CM | POA: Diagnosis not present

## 2022-09-07 DIAGNOSIS — G903 Multi-system degeneration of the autonomic nervous system: Secondary | ICD-10-CM | POA: Diagnosis not present

## 2022-09-07 DIAGNOSIS — Z993 Dependence on wheelchair: Secondary | ICD-10-CM | POA: Diagnosis not present

## 2022-09-07 DIAGNOSIS — I10 Essential (primary) hypertension: Secondary | ICD-10-CM | POA: Diagnosis not present

## 2022-09-07 DIAGNOSIS — E039 Hypothyroidism, unspecified: Secondary | ICD-10-CM | POA: Diagnosis not present

## 2022-09-07 DIAGNOSIS — Z6820 Body mass index (BMI) 20.0-20.9, adult: Secondary | ICD-10-CM | POA: Diagnosis not present

## 2022-09-07 DIAGNOSIS — Z8673 Personal history of transient ischemic attack (TIA), and cerebral infarction without residual deficits: Secondary | ICD-10-CM | POA: Diagnosis not present

## 2022-09-07 DIAGNOSIS — D473 Essential (hemorrhagic) thrombocythemia: Secondary | ICD-10-CM | POA: Diagnosis not present

## 2022-09-11 DIAGNOSIS — Z604 Social exclusion and rejection: Secondary | ICD-10-CM | POA: Diagnosis not present

## 2022-09-11 DIAGNOSIS — M47812 Spondylosis without myelopathy or radiculopathy, cervical region: Secondary | ICD-10-CM | POA: Diagnosis not present

## 2022-09-11 DIAGNOSIS — I69354 Hemiplegia and hemiparesis following cerebral infarction affecting left non-dominant side: Secondary | ICD-10-CM | POA: Diagnosis not present

## 2022-09-11 DIAGNOSIS — N179 Acute kidney failure, unspecified: Secondary | ICD-10-CM | POA: Diagnosis not present

## 2022-09-11 DIAGNOSIS — D693 Immune thrombocytopenic purpura: Secondary | ICD-10-CM | POA: Diagnosis not present

## 2022-09-11 DIAGNOSIS — F32A Depression, unspecified: Secondary | ICD-10-CM | POA: Diagnosis not present

## 2022-09-11 DIAGNOSIS — K922 Gastrointestinal hemorrhage, unspecified: Secondary | ICD-10-CM | POA: Diagnosis not present

## 2022-09-11 DIAGNOSIS — E039 Hypothyroidism, unspecified: Secondary | ICD-10-CM | POA: Diagnosis not present

## 2022-09-11 DIAGNOSIS — Z7902 Long term (current) use of antithrombotics/antiplatelets: Secondary | ICD-10-CM | POA: Diagnosis not present

## 2022-09-11 DIAGNOSIS — I890 Lymphedema, not elsewhere classified: Secondary | ICD-10-CM | POA: Diagnosis not present

## 2022-09-11 DIAGNOSIS — Z556 Problems related to health literacy: Secondary | ICD-10-CM | POA: Diagnosis not present

## 2022-09-11 DIAGNOSIS — I10 Essential (primary) hypertension: Secondary | ICD-10-CM | POA: Diagnosis not present

## 2022-09-11 DIAGNOSIS — G20A1 Parkinson's disease without dyskinesia, without mention of fluctuations: Secondary | ICD-10-CM | POA: Diagnosis not present

## 2022-09-11 DIAGNOSIS — D62 Acute posthemorrhagic anemia: Secondary | ICD-10-CM | POA: Diagnosis not present

## 2022-09-11 DIAGNOSIS — I081 Rheumatic disorders of both mitral and tricuspid valves: Secondary | ICD-10-CM | POA: Diagnosis not present

## 2022-09-11 DIAGNOSIS — H269 Unspecified cataract: Secondary | ICD-10-CM | POA: Diagnosis not present

## 2022-09-11 DIAGNOSIS — Z9181 History of falling: Secondary | ICD-10-CM | POA: Diagnosis not present

## 2022-09-11 DIAGNOSIS — E785 Hyperlipidemia, unspecified: Secondary | ICD-10-CM | POA: Diagnosis not present

## 2022-09-11 DIAGNOSIS — Z5982 Transportation insecurity: Secondary | ICD-10-CM | POA: Diagnosis not present

## 2022-09-15 DIAGNOSIS — F32A Depression, unspecified: Secondary | ICD-10-CM | POA: Diagnosis not present

## 2022-09-15 DIAGNOSIS — K922 Gastrointestinal hemorrhage, unspecified: Secondary | ICD-10-CM | POA: Diagnosis not present

## 2022-09-15 DIAGNOSIS — I69354 Hemiplegia and hemiparesis following cerebral infarction affecting left non-dominant side: Secondary | ICD-10-CM | POA: Diagnosis not present

## 2022-09-15 DIAGNOSIS — N179 Acute kidney failure, unspecified: Secondary | ICD-10-CM | POA: Diagnosis not present

## 2022-09-15 DIAGNOSIS — E039 Hypothyroidism, unspecified: Secondary | ICD-10-CM | POA: Diagnosis not present

## 2022-09-15 DIAGNOSIS — Z9181 History of falling: Secondary | ICD-10-CM | POA: Diagnosis not present

## 2022-09-15 DIAGNOSIS — Z5982 Transportation insecurity: Secondary | ICD-10-CM | POA: Diagnosis not present

## 2022-09-15 DIAGNOSIS — I10 Essential (primary) hypertension: Secondary | ICD-10-CM | POA: Diagnosis not present

## 2022-09-15 DIAGNOSIS — G20A1 Parkinson's disease without dyskinesia, without mention of fluctuations: Secondary | ICD-10-CM | POA: Diagnosis not present

## 2022-09-15 DIAGNOSIS — E785 Hyperlipidemia, unspecified: Secondary | ICD-10-CM | POA: Diagnosis not present

## 2022-09-15 DIAGNOSIS — M47812 Spondylosis without myelopathy or radiculopathy, cervical region: Secondary | ICD-10-CM | POA: Diagnosis not present

## 2022-09-15 DIAGNOSIS — I081 Rheumatic disorders of both mitral and tricuspid valves: Secondary | ICD-10-CM | POA: Diagnosis not present

## 2022-09-15 DIAGNOSIS — Z556 Problems related to health literacy: Secondary | ICD-10-CM | POA: Diagnosis not present

## 2022-09-15 DIAGNOSIS — H269 Unspecified cataract: Secondary | ICD-10-CM | POA: Diagnosis not present

## 2022-09-15 DIAGNOSIS — Z604 Social exclusion and rejection: Secondary | ICD-10-CM | POA: Diagnosis not present

## 2022-09-15 DIAGNOSIS — D62 Acute posthemorrhagic anemia: Secondary | ICD-10-CM | POA: Diagnosis not present

## 2022-09-15 DIAGNOSIS — D693 Immune thrombocytopenic purpura: Secondary | ICD-10-CM | POA: Diagnosis not present

## 2022-09-15 DIAGNOSIS — I890 Lymphedema, not elsewhere classified: Secondary | ICD-10-CM | POA: Diagnosis not present

## 2022-09-15 DIAGNOSIS — Z7902 Long term (current) use of antithrombotics/antiplatelets: Secondary | ICD-10-CM | POA: Diagnosis not present

## 2022-09-17 DIAGNOSIS — K922 Gastrointestinal hemorrhage, unspecified: Secondary | ICD-10-CM | POA: Diagnosis not present

## 2022-09-17 DIAGNOSIS — Z604 Social exclusion and rejection: Secondary | ICD-10-CM | POA: Diagnosis not present

## 2022-09-17 DIAGNOSIS — I10 Essential (primary) hypertension: Secondary | ICD-10-CM | POA: Diagnosis not present

## 2022-09-17 DIAGNOSIS — Z7902 Long term (current) use of antithrombotics/antiplatelets: Secondary | ICD-10-CM | POA: Diagnosis not present

## 2022-09-17 DIAGNOSIS — E785 Hyperlipidemia, unspecified: Secondary | ICD-10-CM | POA: Diagnosis not present

## 2022-09-17 DIAGNOSIS — E039 Hypothyroidism, unspecified: Secondary | ICD-10-CM | POA: Diagnosis not present

## 2022-09-17 DIAGNOSIS — D693 Immune thrombocytopenic purpura: Secondary | ICD-10-CM | POA: Diagnosis not present

## 2022-09-17 DIAGNOSIS — N179 Acute kidney failure, unspecified: Secondary | ICD-10-CM | POA: Diagnosis not present

## 2022-09-17 DIAGNOSIS — Z9181 History of falling: Secondary | ICD-10-CM | POA: Diagnosis not present

## 2022-09-17 DIAGNOSIS — Z5982 Transportation insecurity: Secondary | ICD-10-CM | POA: Diagnosis not present

## 2022-09-17 DIAGNOSIS — I081 Rheumatic disorders of both mitral and tricuspid valves: Secondary | ICD-10-CM | POA: Diagnosis not present

## 2022-09-17 DIAGNOSIS — I69354 Hemiplegia and hemiparesis following cerebral infarction affecting left non-dominant side: Secondary | ICD-10-CM | POA: Diagnosis not present

## 2022-09-17 DIAGNOSIS — H269 Unspecified cataract: Secondary | ICD-10-CM | POA: Diagnosis not present

## 2022-09-17 DIAGNOSIS — M47812 Spondylosis without myelopathy or radiculopathy, cervical region: Secondary | ICD-10-CM | POA: Diagnosis not present

## 2022-09-17 DIAGNOSIS — I890 Lymphedema, not elsewhere classified: Secondary | ICD-10-CM | POA: Diagnosis not present

## 2022-09-17 DIAGNOSIS — D62 Acute posthemorrhagic anemia: Secondary | ICD-10-CM | POA: Diagnosis not present

## 2022-09-17 DIAGNOSIS — G20A1 Parkinson's disease without dyskinesia, without mention of fluctuations: Secondary | ICD-10-CM | POA: Diagnosis not present

## 2022-09-17 DIAGNOSIS — Z556 Problems related to health literacy: Secondary | ICD-10-CM | POA: Diagnosis not present

## 2022-09-17 DIAGNOSIS — F32A Depression, unspecified: Secondary | ICD-10-CM | POA: Diagnosis not present

## 2022-09-18 DIAGNOSIS — M47812 Spondylosis without myelopathy or radiculopathy, cervical region: Secondary | ICD-10-CM | POA: Diagnosis not present

## 2022-09-18 DIAGNOSIS — D62 Acute posthemorrhagic anemia: Secondary | ICD-10-CM | POA: Diagnosis not present

## 2022-09-18 DIAGNOSIS — E039 Hypothyroidism, unspecified: Secondary | ICD-10-CM | POA: Diagnosis not present

## 2022-09-18 DIAGNOSIS — Z5982 Transportation insecurity: Secondary | ICD-10-CM | POA: Diagnosis not present

## 2022-09-18 DIAGNOSIS — F32A Depression, unspecified: Secondary | ICD-10-CM | POA: Diagnosis not present

## 2022-09-18 DIAGNOSIS — Z604 Social exclusion and rejection: Secondary | ICD-10-CM | POA: Diagnosis not present

## 2022-09-18 DIAGNOSIS — N179 Acute kidney failure, unspecified: Secondary | ICD-10-CM | POA: Diagnosis not present

## 2022-09-18 DIAGNOSIS — H269 Unspecified cataract: Secondary | ICD-10-CM | POA: Diagnosis not present

## 2022-09-18 DIAGNOSIS — Z9181 History of falling: Secondary | ICD-10-CM | POA: Diagnosis not present

## 2022-09-18 DIAGNOSIS — I69354 Hemiplegia and hemiparesis following cerebral infarction affecting left non-dominant side: Secondary | ICD-10-CM | POA: Diagnosis not present

## 2022-09-18 DIAGNOSIS — K922 Gastrointestinal hemorrhage, unspecified: Secondary | ICD-10-CM | POA: Diagnosis not present

## 2022-09-18 DIAGNOSIS — I890 Lymphedema, not elsewhere classified: Secondary | ICD-10-CM | POA: Diagnosis not present

## 2022-09-18 DIAGNOSIS — D693 Immune thrombocytopenic purpura: Secondary | ICD-10-CM | POA: Diagnosis not present

## 2022-09-18 DIAGNOSIS — I10 Essential (primary) hypertension: Secondary | ICD-10-CM | POA: Diagnosis not present

## 2022-09-18 DIAGNOSIS — E785 Hyperlipidemia, unspecified: Secondary | ICD-10-CM | POA: Diagnosis not present

## 2022-09-18 DIAGNOSIS — G20A1 Parkinson's disease without dyskinesia, without mention of fluctuations: Secondary | ICD-10-CM | POA: Diagnosis not present

## 2022-09-18 DIAGNOSIS — I081 Rheumatic disorders of both mitral and tricuspid valves: Secondary | ICD-10-CM | POA: Diagnosis not present

## 2022-09-18 DIAGNOSIS — Z556 Problems related to health literacy: Secondary | ICD-10-CM | POA: Diagnosis not present

## 2022-09-18 DIAGNOSIS — Z7902 Long term (current) use of antithrombotics/antiplatelets: Secondary | ICD-10-CM | POA: Diagnosis not present

## 2022-09-21 DIAGNOSIS — Z556 Problems related to health literacy: Secondary | ICD-10-CM | POA: Diagnosis not present

## 2022-09-21 DIAGNOSIS — Z9181 History of falling: Secondary | ICD-10-CM | POA: Diagnosis not present

## 2022-09-21 DIAGNOSIS — I10 Essential (primary) hypertension: Secondary | ICD-10-CM | POA: Diagnosis not present

## 2022-09-21 DIAGNOSIS — M47812 Spondylosis without myelopathy or radiculopathy, cervical region: Secondary | ICD-10-CM | POA: Diagnosis not present

## 2022-09-21 DIAGNOSIS — E785 Hyperlipidemia, unspecified: Secondary | ICD-10-CM | POA: Diagnosis not present

## 2022-09-21 DIAGNOSIS — H269 Unspecified cataract: Secondary | ICD-10-CM | POA: Diagnosis not present

## 2022-09-21 DIAGNOSIS — Z604 Social exclusion and rejection: Secondary | ICD-10-CM | POA: Diagnosis not present

## 2022-09-21 DIAGNOSIS — I081 Rheumatic disorders of both mitral and tricuspid valves: Secondary | ICD-10-CM | POA: Diagnosis not present

## 2022-09-21 DIAGNOSIS — I69354 Hemiplegia and hemiparesis following cerebral infarction affecting left non-dominant side: Secondary | ICD-10-CM | POA: Diagnosis not present

## 2022-09-21 DIAGNOSIS — Z5982 Transportation insecurity: Secondary | ICD-10-CM | POA: Diagnosis not present

## 2022-09-21 DIAGNOSIS — E039 Hypothyroidism, unspecified: Secondary | ICD-10-CM | POA: Diagnosis not present

## 2022-09-21 DIAGNOSIS — G20A1 Parkinson's disease without dyskinesia, without mention of fluctuations: Secondary | ICD-10-CM | POA: Diagnosis not present

## 2022-09-21 DIAGNOSIS — D62 Acute posthemorrhagic anemia: Secondary | ICD-10-CM | POA: Diagnosis not present

## 2022-09-21 DIAGNOSIS — D693 Immune thrombocytopenic purpura: Secondary | ICD-10-CM | POA: Diagnosis not present

## 2022-09-21 DIAGNOSIS — N179 Acute kidney failure, unspecified: Secondary | ICD-10-CM | POA: Diagnosis not present

## 2022-09-21 DIAGNOSIS — I890 Lymphedema, not elsewhere classified: Secondary | ICD-10-CM | POA: Diagnosis not present

## 2022-09-21 DIAGNOSIS — Z7902 Long term (current) use of antithrombotics/antiplatelets: Secondary | ICD-10-CM | POA: Diagnosis not present

## 2022-09-21 DIAGNOSIS — K922 Gastrointestinal hemorrhage, unspecified: Secondary | ICD-10-CM | POA: Diagnosis not present

## 2022-09-21 DIAGNOSIS — F32A Depression, unspecified: Secondary | ICD-10-CM | POA: Diagnosis not present

## 2022-09-23 DIAGNOSIS — K922 Gastrointestinal hemorrhage, unspecified: Secondary | ICD-10-CM | POA: Diagnosis not present

## 2022-09-23 DIAGNOSIS — E785 Hyperlipidemia, unspecified: Secondary | ICD-10-CM | POA: Diagnosis not present

## 2022-09-23 DIAGNOSIS — I10 Essential (primary) hypertension: Secondary | ICD-10-CM | POA: Diagnosis not present

## 2022-09-23 DIAGNOSIS — Z5982 Transportation insecurity: Secondary | ICD-10-CM | POA: Diagnosis not present

## 2022-09-23 DIAGNOSIS — I081 Rheumatic disorders of both mitral and tricuspid valves: Secondary | ICD-10-CM | POA: Diagnosis not present

## 2022-09-23 DIAGNOSIS — Z9181 History of falling: Secondary | ICD-10-CM | POA: Diagnosis not present

## 2022-09-23 DIAGNOSIS — N179 Acute kidney failure, unspecified: Secondary | ICD-10-CM | POA: Diagnosis not present

## 2022-09-23 DIAGNOSIS — G20A1 Parkinson's disease without dyskinesia, without mention of fluctuations: Secondary | ICD-10-CM | POA: Diagnosis not present

## 2022-09-23 DIAGNOSIS — Z556 Problems related to health literacy: Secondary | ICD-10-CM | POA: Diagnosis not present

## 2022-09-23 DIAGNOSIS — F32A Depression, unspecified: Secondary | ICD-10-CM | POA: Diagnosis not present

## 2022-09-23 DIAGNOSIS — I69354 Hemiplegia and hemiparesis following cerebral infarction affecting left non-dominant side: Secondary | ICD-10-CM | POA: Diagnosis not present

## 2022-09-23 DIAGNOSIS — D693 Immune thrombocytopenic purpura: Secondary | ICD-10-CM | POA: Diagnosis not present

## 2022-09-23 DIAGNOSIS — I890 Lymphedema, not elsewhere classified: Secondary | ICD-10-CM | POA: Diagnosis not present

## 2022-09-23 DIAGNOSIS — D62 Acute posthemorrhagic anemia: Secondary | ICD-10-CM | POA: Diagnosis not present

## 2022-09-23 DIAGNOSIS — H269 Unspecified cataract: Secondary | ICD-10-CM | POA: Diagnosis not present

## 2022-09-23 DIAGNOSIS — Z604 Social exclusion and rejection: Secondary | ICD-10-CM | POA: Diagnosis not present

## 2022-09-23 DIAGNOSIS — E039 Hypothyroidism, unspecified: Secondary | ICD-10-CM | POA: Diagnosis not present

## 2022-09-23 DIAGNOSIS — M47812 Spondylosis without myelopathy or radiculopathy, cervical region: Secondary | ICD-10-CM | POA: Diagnosis not present

## 2022-09-23 DIAGNOSIS — Z7902 Long term (current) use of antithrombotics/antiplatelets: Secondary | ICD-10-CM | POA: Diagnosis not present

## 2022-09-24 DIAGNOSIS — M47812 Spondylosis without myelopathy or radiculopathy, cervical region: Secondary | ICD-10-CM | POA: Diagnosis not present

## 2022-09-24 DIAGNOSIS — D62 Acute posthemorrhagic anemia: Secondary | ICD-10-CM | POA: Diagnosis not present

## 2022-09-24 DIAGNOSIS — G20A1 Parkinson's disease without dyskinesia, without mention of fluctuations: Secondary | ICD-10-CM | POA: Diagnosis not present

## 2022-09-24 DIAGNOSIS — I69354 Hemiplegia and hemiparesis following cerebral infarction affecting left non-dominant side: Secondary | ICD-10-CM | POA: Diagnosis not present

## 2022-09-24 DIAGNOSIS — F32A Depression, unspecified: Secondary | ICD-10-CM | POA: Diagnosis not present

## 2022-09-24 DIAGNOSIS — Z7902 Long term (current) use of antithrombotics/antiplatelets: Secondary | ICD-10-CM | POA: Diagnosis not present

## 2022-09-24 DIAGNOSIS — I10 Essential (primary) hypertension: Secondary | ICD-10-CM | POA: Diagnosis not present

## 2022-09-24 DIAGNOSIS — H269 Unspecified cataract: Secondary | ICD-10-CM | POA: Diagnosis not present

## 2022-09-24 DIAGNOSIS — I081 Rheumatic disorders of both mitral and tricuspid valves: Secondary | ICD-10-CM | POA: Diagnosis not present

## 2022-09-24 DIAGNOSIS — Z604 Social exclusion and rejection: Secondary | ICD-10-CM | POA: Diagnosis not present

## 2022-09-24 DIAGNOSIS — Z556 Problems related to health literacy: Secondary | ICD-10-CM | POA: Diagnosis not present

## 2022-09-24 DIAGNOSIS — D693 Immune thrombocytopenic purpura: Secondary | ICD-10-CM | POA: Diagnosis not present

## 2022-09-24 DIAGNOSIS — E785 Hyperlipidemia, unspecified: Secondary | ICD-10-CM | POA: Diagnosis not present

## 2022-09-24 DIAGNOSIS — Z9181 History of falling: Secondary | ICD-10-CM | POA: Diagnosis not present

## 2022-09-24 DIAGNOSIS — E039 Hypothyroidism, unspecified: Secondary | ICD-10-CM | POA: Diagnosis not present

## 2022-09-24 DIAGNOSIS — Z5982 Transportation insecurity: Secondary | ICD-10-CM | POA: Diagnosis not present

## 2022-09-24 DIAGNOSIS — I890 Lymphedema, not elsewhere classified: Secondary | ICD-10-CM | POA: Diagnosis not present

## 2022-09-24 DIAGNOSIS — N179 Acute kidney failure, unspecified: Secondary | ICD-10-CM | POA: Diagnosis not present

## 2022-09-24 DIAGNOSIS — K922 Gastrointestinal hemorrhage, unspecified: Secondary | ICD-10-CM | POA: Diagnosis not present

## 2022-09-28 DIAGNOSIS — Z9181 History of falling: Secondary | ICD-10-CM | POA: Diagnosis not present

## 2022-09-28 DIAGNOSIS — Z7902 Long term (current) use of antithrombotics/antiplatelets: Secondary | ICD-10-CM | POA: Diagnosis not present

## 2022-09-28 DIAGNOSIS — I69354 Hemiplegia and hemiparesis following cerebral infarction affecting left non-dominant side: Secondary | ICD-10-CM | POA: Diagnosis not present

## 2022-09-28 DIAGNOSIS — I890 Lymphedema, not elsewhere classified: Secondary | ICD-10-CM | POA: Diagnosis not present

## 2022-09-28 DIAGNOSIS — E039 Hypothyroidism, unspecified: Secondary | ICD-10-CM | POA: Diagnosis not present

## 2022-09-28 DIAGNOSIS — Z556 Problems related to health literacy: Secondary | ICD-10-CM | POA: Diagnosis not present

## 2022-09-28 DIAGNOSIS — D693 Immune thrombocytopenic purpura: Secondary | ICD-10-CM | POA: Diagnosis not present

## 2022-09-28 DIAGNOSIS — F32A Depression, unspecified: Secondary | ICD-10-CM | POA: Diagnosis not present

## 2022-09-28 DIAGNOSIS — D62 Acute posthemorrhagic anemia: Secondary | ICD-10-CM | POA: Diagnosis not present

## 2022-09-28 DIAGNOSIS — E785 Hyperlipidemia, unspecified: Secondary | ICD-10-CM | POA: Diagnosis not present

## 2022-09-28 DIAGNOSIS — M47812 Spondylosis without myelopathy or radiculopathy, cervical region: Secondary | ICD-10-CM | POA: Diagnosis not present

## 2022-09-28 DIAGNOSIS — G20A1 Parkinson's disease without dyskinesia, without mention of fluctuations: Secondary | ICD-10-CM | POA: Diagnosis not present

## 2022-09-28 DIAGNOSIS — Z604 Social exclusion and rejection: Secondary | ICD-10-CM | POA: Diagnosis not present

## 2022-09-28 DIAGNOSIS — H269 Unspecified cataract: Secondary | ICD-10-CM | POA: Diagnosis not present

## 2022-09-28 DIAGNOSIS — N179 Acute kidney failure, unspecified: Secondary | ICD-10-CM | POA: Diagnosis not present

## 2022-09-28 DIAGNOSIS — Z5982 Transportation insecurity: Secondary | ICD-10-CM | POA: Diagnosis not present

## 2022-09-28 DIAGNOSIS — K922 Gastrointestinal hemorrhage, unspecified: Secondary | ICD-10-CM | POA: Diagnosis not present

## 2022-09-28 DIAGNOSIS — I10 Essential (primary) hypertension: Secondary | ICD-10-CM | POA: Diagnosis not present

## 2022-09-28 DIAGNOSIS — I081 Rheumatic disorders of both mitral and tricuspid valves: Secondary | ICD-10-CM | POA: Diagnosis not present

## 2022-09-29 DIAGNOSIS — M47812 Spondylosis without myelopathy or radiculopathy, cervical region: Secondary | ICD-10-CM | POA: Diagnosis not present

## 2022-09-29 DIAGNOSIS — Z7902 Long term (current) use of antithrombotics/antiplatelets: Secondary | ICD-10-CM | POA: Diagnosis not present

## 2022-09-29 DIAGNOSIS — N179 Acute kidney failure, unspecified: Secondary | ICD-10-CM | POA: Diagnosis not present

## 2022-09-29 DIAGNOSIS — E039 Hypothyroidism, unspecified: Secondary | ICD-10-CM | POA: Diagnosis not present

## 2022-09-29 DIAGNOSIS — Z5982 Transportation insecurity: Secondary | ICD-10-CM | POA: Diagnosis not present

## 2022-09-29 DIAGNOSIS — G20A1 Parkinson's disease without dyskinesia, without mention of fluctuations: Secondary | ICD-10-CM | POA: Diagnosis not present

## 2022-09-29 DIAGNOSIS — I10 Essential (primary) hypertension: Secondary | ICD-10-CM | POA: Diagnosis not present

## 2022-09-29 DIAGNOSIS — I69354 Hemiplegia and hemiparesis following cerebral infarction affecting left non-dominant side: Secondary | ICD-10-CM | POA: Diagnosis not present

## 2022-09-29 DIAGNOSIS — D62 Acute posthemorrhagic anemia: Secondary | ICD-10-CM | POA: Diagnosis not present

## 2022-09-29 DIAGNOSIS — D693 Immune thrombocytopenic purpura: Secondary | ICD-10-CM | POA: Diagnosis not present

## 2022-09-29 DIAGNOSIS — H269 Unspecified cataract: Secondary | ICD-10-CM | POA: Diagnosis not present

## 2022-09-29 DIAGNOSIS — Z556 Problems related to health literacy: Secondary | ICD-10-CM | POA: Diagnosis not present

## 2022-09-29 DIAGNOSIS — Z9181 History of falling: Secondary | ICD-10-CM | POA: Diagnosis not present

## 2022-09-29 DIAGNOSIS — K922 Gastrointestinal hemorrhage, unspecified: Secondary | ICD-10-CM | POA: Diagnosis not present

## 2022-09-29 DIAGNOSIS — Z604 Social exclusion and rejection: Secondary | ICD-10-CM | POA: Diagnosis not present

## 2022-09-29 DIAGNOSIS — F32A Depression, unspecified: Secondary | ICD-10-CM | POA: Diagnosis not present

## 2022-09-29 DIAGNOSIS — E785 Hyperlipidemia, unspecified: Secondary | ICD-10-CM | POA: Diagnosis not present

## 2022-09-29 DIAGNOSIS — I081 Rheumatic disorders of both mitral and tricuspid valves: Secondary | ICD-10-CM | POA: Diagnosis not present

## 2022-09-29 DIAGNOSIS — I890 Lymphedema, not elsewhere classified: Secondary | ICD-10-CM | POA: Diagnosis not present

## 2022-10-02 DIAGNOSIS — M47812 Spondylosis without myelopathy or radiculopathy, cervical region: Secondary | ICD-10-CM | POA: Diagnosis not present

## 2022-10-02 DIAGNOSIS — Z7902 Long term (current) use of antithrombotics/antiplatelets: Secondary | ICD-10-CM | POA: Diagnosis not present

## 2022-10-02 DIAGNOSIS — Z556 Problems related to health literacy: Secondary | ICD-10-CM | POA: Diagnosis not present

## 2022-10-02 DIAGNOSIS — G20A1 Parkinson's disease without dyskinesia, without mention of fluctuations: Secondary | ICD-10-CM | POA: Diagnosis not present

## 2022-10-02 DIAGNOSIS — K922 Gastrointestinal hemorrhage, unspecified: Secondary | ICD-10-CM | POA: Diagnosis not present

## 2022-10-02 DIAGNOSIS — D693 Immune thrombocytopenic purpura: Secondary | ICD-10-CM | POA: Diagnosis not present

## 2022-10-02 DIAGNOSIS — F32A Depression, unspecified: Secondary | ICD-10-CM | POA: Diagnosis not present

## 2022-10-02 DIAGNOSIS — I69354 Hemiplegia and hemiparesis following cerebral infarction affecting left non-dominant side: Secondary | ICD-10-CM | POA: Diagnosis not present

## 2022-10-02 DIAGNOSIS — H269 Unspecified cataract: Secondary | ICD-10-CM | POA: Diagnosis not present

## 2022-10-02 DIAGNOSIS — E039 Hypothyroidism, unspecified: Secondary | ICD-10-CM | POA: Diagnosis not present

## 2022-10-02 DIAGNOSIS — I890 Lymphedema, not elsewhere classified: Secondary | ICD-10-CM | POA: Diagnosis not present

## 2022-10-02 DIAGNOSIS — I10 Essential (primary) hypertension: Secondary | ICD-10-CM | POA: Diagnosis not present

## 2022-10-02 DIAGNOSIS — Z9181 History of falling: Secondary | ICD-10-CM | POA: Diagnosis not present

## 2022-10-02 DIAGNOSIS — I081 Rheumatic disorders of both mitral and tricuspid valves: Secondary | ICD-10-CM | POA: Diagnosis not present

## 2022-10-02 DIAGNOSIS — Z604 Social exclusion and rejection: Secondary | ICD-10-CM | POA: Diagnosis not present

## 2022-10-02 DIAGNOSIS — D62 Acute posthemorrhagic anemia: Secondary | ICD-10-CM | POA: Diagnosis not present

## 2022-10-02 DIAGNOSIS — Z5982 Transportation insecurity: Secondary | ICD-10-CM | POA: Diagnosis not present

## 2022-10-02 DIAGNOSIS — N179 Acute kidney failure, unspecified: Secondary | ICD-10-CM | POA: Diagnosis not present

## 2022-10-02 DIAGNOSIS — E785 Hyperlipidemia, unspecified: Secondary | ICD-10-CM | POA: Diagnosis not present

## 2022-10-05 DIAGNOSIS — Z7902 Long term (current) use of antithrombotics/antiplatelets: Secondary | ICD-10-CM | POA: Diagnosis not present

## 2022-10-05 DIAGNOSIS — K922 Gastrointestinal hemorrhage, unspecified: Secondary | ICD-10-CM | POA: Diagnosis not present

## 2022-10-05 DIAGNOSIS — D693 Immune thrombocytopenic purpura: Secondary | ICD-10-CM | POA: Diagnosis not present

## 2022-10-05 DIAGNOSIS — I081 Rheumatic disorders of both mitral and tricuspid valves: Secondary | ICD-10-CM | POA: Diagnosis not present

## 2022-10-05 DIAGNOSIS — E039 Hypothyroidism, unspecified: Secondary | ICD-10-CM | POA: Diagnosis not present

## 2022-10-05 DIAGNOSIS — M47812 Spondylosis without myelopathy or radiculopathy, cervical region: Secondary | ICD-10-CM | POA: Diagnosis not present

## 2022-10-05 DIAGNOSIS — I69354 Hemiplegia and hemiparesis following cerebral infarction affecting left non-dominant side: Secondary | ICD-10-CM | POA: Diagnosis not present

## 2022-10-05 DIAGNOSIS — Z5982 Transportation insecurity: Secondary | ICD-10-CM | POA: Diagnosis not present

## 2022-10-05 DIAGNOSIS — H269 Unspecified cataract: Secondary | ICD-10-CM | POA: Diagnosis not present

## 2022-10-05 DIAGNOSIS — Z604 Social exclusion and rejection: Secondary | ICD-10-CM | POA: Diagnosis not present

## 2022-10-05 DIAGNOSIS — D62 Acute posthemorrhagic anemia: Secondary | ICD-10-CM | POA: Diagnosis not present

## 2022-10-05 DIAGNOSIS — N179 Acute kidney failure, unspecified: Secondary | ICD-10-CM | POA: Diagnosis not present

## 2022-10-05 DIAGNOSIS — I10 Essential (primary) hypertension: Secondary | ICD-10-CM | POA: Diagnosis not present

## 2022-10-05 DIAGNOSIS — G20A1 Parkinson's disease without dyskinesia, without mention of fluctuations: Secondary | ICD-10-CM | POA: Diagnosis not present

## 2022-10-05 DIAGNOSIS — F32A Depression, unspecified: Secondary | ICD-10-CM | POA: Diagnosis not present

## 2022-10-05 DIAGNOSIS — Z9181 History of falling: Secondary | ICD-10-CM | POA: Diagnosis not present

## 2022-10-05 DIAGNOSIS — Z556 Problems related to health literacy: Secondary | ICD-10-CM | POA: Diagnosis not present

## 2022-10-05 DIAGNOSIS — E785 Hyperlipidemia, unspecified: Secondary | ICD-10-CM | POA: Diagnosis not present

## 2022-10-05 DIAGNOSIS — I890 Lymphedema, not elsewhere classified: Secondary | ICD-10-CM | POA: Diagnosis not present

## 2022-10-08 DIAGNOSIS — Z604 Social exclusion and rejection: Secondary | ICD-10-CM | POA: Diagnosis not present

## 2022-10-08 DIAGNOSIS — Z9181 History of falling: Secondary | ICD-10-CM | POA: Diagnosis not present

## 2022-10-08 DIAGNOSIS — N179 Acute kidney failure, unspecified: Secondary | ICD-10-CM | POA: Diagnosis not present

## 2022-10-08 DIAGNOSIS — K922 Gastrointestinal hemorrhage, unspecified: Secondary | ICD-10-CM | POA: Diagnosis not present

## 2022-10-08 DIAGNOSIS — E785 Hyperlipidemia, unspecified: Secondary | ICD-10-CM | POA: Diagnosis not present

## 2022-10-08 DIAGNOSIS — I081 Rheumatic disorders of both mitral and tricuspid valves: Secondary | ICD-10-CM | POA: Diagnosis not present

## 2022-10-08 DIAGNOSIS — Z7902 Long term (current) use of antithrombotics/antiplatelets: Secondary | ICD-10-CM | POA: Diagnosis not present

## 2022-10-08 DIAGNOSIS — I69354 Hemiplegia and hemiparesis following cerebral infarction affecting left non-dominant side: Secondary | ICD-10-CM | POA: Diagnosis not present

## 2022-10-08 DIAGNOSIS — Z5982 Transportation insecurity: Secondary | ICD-10-CM | POA: Diagnosis not present

## 2022-10-08 DIAGNOSIS — G20A1 Parkinson's disease without dyskinesia, without mention of fluctuations: Secondary | ICD-10-CM | POA: Diagnosis not present

## 2022-10-08 DIAGNOSIS — E039 Hypothyroidism, unspecified: Secondary | ICD-10-CM | POA: Diagnosis not present

## 2022-10-08 DIAGNOSIS — D62 Acute posthemorrhagic anemia: Secondary | ICD-10-CM | POA: Diagnosis not present

## 2022-10-08 DIAGNOSIS — M47812 Spondylosis without myelopathy or radiculopathy, cervical region: Secondary | ICD-10-CM | POA: Diagnosis not present

## 2022-10-08 DIAGNOSIS — I10 Essential (primary) hypertension: Secondary | ICD-10-CM | POA: Diagnosis not present

## 2022-10-08 DIAGNOSIS — D693 Immune thrombocytopenic purpura: Secondary | ICD-10-CM | POA: Diagnosis not present

## 2022-10-08 DIAGNOSIS — I890 Lymphedema, not elsewhere classified: Secondary | ICD-10-CM | POA: Diagnosis not present

## 2022-10-08 DIAGNOSIS — Z556 Problems related to health literacy: Secondary | ICD-10-CM | POA: Diagnosis not present

## 2022-10-08 DIAGNOSIS — H269 Unspecified cataract: Secondary | ICD-10-CM | POA: Diagnosis not present

## 2022-10-08 DIAGNOSIS — F32A Depression, unspecified: Secondary | ICD-10-CM | POA: Diagnosis not present

## 2022-10-09 DIAGNOSIS — I10 Essential (primary) hypertension: Secondary | ICD-10-CM | POA: Diagnosis not present

## 2022-10-09 DIAGNOSIS — D693 Immune thrombocytopenic purpura: Secondary | ICD-10-CM | POA: Diagnosis not present

## 2022-10-09 DIAGNOSIS — I69354 Hemiplegia and hemiparesis following cerebral infarction affecting left non-dominant side: Secondary | ICD-10-CM | POA: Diagnosis not present

## 2022-10-09 DIAGNOSIS — N179 Acute kidney failure, unspecified: Secondary | ICD-10-CM | POA: Diagnosis not present

## 2022-10-09 DIAGNOSIS — F32A Depression, unspecified: Secondary | ICD-10-CM | POA: Diagnosis not present

## 2022-10-09 DIAGNOSIS — Z604 Social exclusion and rejection: Secondary | ICD-10-CM | POA: Diagnosis not present

## 2022-10-09 DIAGNOSIS — I890 Lymphedema, not elsewhere classified: Secondary | ICD-10-CM | POA: Diagnosis not present

## 2022-10-09 DIAGNOSIS — I081 Rheumatic disorders of both mitral and tricuspid valves: Secondary | ICD-10-CM | POA: Diagnosis not present

## 2022-10-09 DIAGNOSIS — E785 Hyperlipidemia, unspecified: Secondary | ICD-10-CM | POA: Diagnosis not present

## 2022-10-09 DIAGNOSIS — Z7902 Long term (current) use of antithrombotics/antiplatelets: Secondary | ICD-10-CM | POA: Diagnosis not present

## 2022-10-09 DIAGNOSIS — G20A1 Parkinson's disease without dyskinesia, without mention of fluctuations: Secondary | ICD-10-CM | POA: Diagnosis not present

## 2022-10-09 DIAGNOSIS — Z9181 History of falling: Secondary | ICD-10-CM | POA: Diagnosis not present

## 2022-10-09 DIAGNOSIS — M47812 Spondylosis without myelopathy or radiculopathy, cervical region: Secondary | ICD-10-CM | POA: Diagnosis not present

## 2022-10-09 DIAGNOSIS — Z5982 Transportation insecurity: Secondary | ICD-10-CM | POA: Diagnosis not present

## 2022-10-09 DIAGNOSIS — D62 Acute posthemorrhagic anemia: Secondary | ICD-10-CM | POA: Diagnosis not present

## 2022-10-09 DIAGNOSIS — K922 Gastrointestinal hemorrhage, unspecified: Secondary | ICD-10-CM | POA: Diagnosis not present

## 2022-10-09 DIAGNOSIS — H269 Unspecified cataract: Secondary | ICD-10-CM | POA: Diagnosis not present

## 2022-10-09 DIAGNOSIS — E039 Hypothyroidism, unspecified: Secondary | ICD-10-CM | POA: Diagnosis not present

## 2022-10-09 DIAGNOSIS — Z556 Problems related to health literacy: Secondary | ICD-10-CM | POA: Diagnosis not present

## 2022-10-15 DIAGNOSIS — G20A1 Parkinson's disease without dyskinesia, without mention of fluctuations: Secondary | ICD-10-CM | POA: Diagnosis not present

## 2022-10-15 DIAGNOSIS — K922 Gastrointestinal hemorrhage, unspecified: Secondary | ICD-10-CM | POA: Diagnosis not present

## 2022-10-15 DIAGNOSIS — I69354 Hemiplegia and hemiparesis following cerebral infarction affecting left non-dominant side: Secondary | ICD-10-CM | POA: Diagnosis not present

## 2022-10-15 DIAGNOSIS — Z556 Problems related to health literacy: Secondary | ICD-10-CM | POA: Diagnosis not present

## 2022-10-15 DIAGNOSIS — D693 Immune thrombocytopenic purpura: Secondary | ICD-10-CM | POA: Diagnosis not present

## 2022-10-15 DIAGNOSIS — Z7902 Long term (current) use of antithrombotics/antiplatelets: Secondary | ICD-10-CM | POA: Diagnosis not present

## 2022-10-15 DIAGNOSIS — D62 Acute posthemorrhagic anemia: Secondary | ICD-10-CM | POA: Diagnosis not present

## 2022-10-15 DIAGNOSIS — E785 Hyperlipidemia, unspecified: Secondary | ICD-10-CM | POA: Diagnosis not present

## 2022-10-15 DIAGNOSIS — I10 Essential (primary) hypertension: Secondary | ICD-10-CM | POA: Diagnosis not present

## 2022-10-15 DIAGNOSIS — N179 Acute kidney failure, unspecified: Secondary | ICD-10-CM | POA: Diagnosis not present

## 2022-10-15 DIAGNOSIS — E039 Hypothyroidism, unspecified: Secondary | ICD-10-CM | POA: Diagnosis not present

## 2022-10-15 DIAGNOSIS — Z5982 Transportation insecurity: Secondary | ICD-10-CM | POA: Diagnosis not present

## 2022-10-15 DIAGNOSIS — Z604 Social exclusion and rejection: Secondary | ICD-10-CM | POA: Diagnosis not present

## 2022-10-15 DIAGNOSIS — I890 Lymphedema, not elsewhere classified: Secondary | ICD-10-CM | POA: Diagnosis not present

## 2022-10-15 DIAGNOSIS — I081 Rheumatic disorders of both mitral and tricuspid valves: Secondary | ICD-10-CM | POA: Diagnosis not present

## 2022-10-15 DIAGNOSIS — Z9181 History of falling: Secondary | ICD-10-CM | POA: Diagnosis not present

## 2022-10-15 DIAGNOSIS — M47812 Spondylosis without myelopathy or radiculopathy, cervical region: Secondary | ICD-10-CM | POA: Diagnosis not present

## 2022-10-15 DIAGNOSIS — H269 Unspecified cataract: Secondary | ICD-10-CM | POA: Diagnosis not present

## 2022-10-16 DIAGNOSIS — Z5982 Transportation insecurity: Secondary | ICD-10-CM | POA: Diagnosis not present

## 2022-10-16 DIAGNOSIS — I69354 Hemiplegia and hemiparesis following cerebral infarction affecting left non-dominant side: Secondary | ICD-10-CM | POA: Diagnosis not present

## 2022-10-16 DIAGNOSIS — Z604 Social exclusion and rejection: Secondary | ICD-10-CM | POA: Diagnosis not present

## 2022-10-16 DIAGNOSIS — Z556 Problems related to health literacy: Secondary | ICD-10-CM | POA: Diagnosis not present

## 2022-10-16 DIAGNOSIS — G20A1 Parkinson's disease without dyskinesia, without mention of fluctuations: Secondary | ICD-10-CM | POA: Diagnosis not present

## 2022-10-16 DIAGNOSIS — I890 Lymphedema, not elsewhere classified: Secondary | ICD-10-CM | POA: Diagnosis not present

## 2022-10-16 DIAGNOSIS — H269 Unspecified cataract: Secondary | ICD-10-CM | POA: Diagnosis not present

## 2022-10-16 DIAGNOSIS — D693 Immune thrombocytopenic purpura: Secondary | ICD-10-CM | POA: Diagnosis not present

## 2022-10-16 DIAGNOSIS — Z7902 Long term (current) use of antithrombotics/antiplatelets: Secondary | ICD-10-CM | POA: Diagnosis not present

## 2022-10-16 DIAGNOSIS — E039 Hypothyroidism, unspecified: Secondary | ICD-10-CM | POA: Diagnosis not present

## 2022-10-16 DIAGNOSIS — I10 Essential (primary) hypertension: Secondary | ICD-10-CM | POA: Diagnosis not present

## 2022-10-16 DIAGNOSIS — D62 Acute posthemorrhagic anemia: Secondary | ICD-10-CM | POA: Diagnosis not present

## 2022-10-16 DIAGNOSIS — E785 Hyperlipidemia, unspecified: Secondary | ICD-10-CM | POA: Diagnosis not present

## 2022-10-16 DIAGNOSIS — N179 Acute kidney failure, unspecified: Secondary | ICD-10-CM | POA: Diagnosis not present

## 2022-10-16 DIAGNOSIS — Z9181 History of falling: Secondary | ICD-10-CM | POA: Diagnosis not present

## 2022-10-16 DIAGNOSIS — I081 Rheumatic disorders of both mitral and tricuspid valves: Secondary | ICD-10-CM | POA: Diagnosis not present

## 2022-10-16 DIAGNOSIS — K922 Gastrointestinal hemorrhage, unspecified: Secondary | ICD-10-CM | POA: Diagnosis not present

## 2022-10-16 DIAGNOSIS — M47812 Spondylosis without myelopathy or radiculopathy, cervical region: Secondary | ICD-10-CM | POA: Diagnosis not present

## 2022-10-19 DIAGNOSIS — I081 Rheumatic disorders of both mitral and tricuspid valves: Secondary | ICD-10-CM | POA: Diagnosis not present

## 2022-10-19 DIAGNOSIS — H269 Unspecified cataract: Secondary | ICD-10-CM | POA: Diagnosis not present

## 2022-10-19 DIAGNOSIS — I10 Essential (primary) hypertension: Secondary | ICD-10-CM | POA: Diagnosis not present

## 2022-10-19 DIAGNOSIS — D693 Immune thrombocytopenic purpura: Secondary | ICD-10-CM | POA: Diagnosis not present

## 2022-10-19 DIAGNOSIS — Z556 Problems related to health literacy: Secondary | ICD-10-CM | POA: Diagnosis not present

## 2022-10-19 DIAGNOSIS — Z7902 Long term (current) use of antithrombotics/antiplatelets: Secondary | ICD-10-CM | POA: Diagnosis not present

## 2022-10-19 DIAGNOSIS — I69354 Hemiplegia and hemiparesis following cerebral infarction affecting left non-dominant side: Secondary | ICD-10-CM | POA: Diagnosis not present

## 2022-10-19 DIAGNOSIS — E039 Hypothyroidism, unspecified: Secondary | ICD-10-CM | POA: Diagnosis not present

## 2022-10-19 DIAGNOSIS — M47812 Spondylosis without myelopathy or radiculopathy, cervical region: Secondary | ICD-10-CM | POA: Diagnosis not present

## 2022-10-19 DIAGNOSIS — G20A1 Parkinson's disease without dyskinesia, without mention of fluctuations: Secondary | ICD-10-CM | POA: Diagnosis not present

## 2022-10-19 DIAGNOSIS — I890 Lymphedema, not elsewhere classified: Secondary | ICD-10-CM | POA: Diagnosis not present

## 2022-10-19 DIAGNOSIS — Z5982 Transportation insecurity: Secondary | ICD-10-CM | POA: Diagnosis not present

## 2022-10-19 DIAGNOSIS — K922 Gastrointestinal hemorrhage, unspecified: Secondary | ICD-10-CM | POA: Diagnosis not present

## 2022-10-19 DIAGNOSIS — D62 Acute posthemorrhagic anemia: Secondary | ICD-10-CM | POA: Diagnosis not present

## 2022-10-19 DIAGNOSIS — E785 Hyperlipidemia, unspecified: Secondary | ICD-10-CM | POA: Diagnosis not present

## 2022-10-19 DIAGNOSIS — Z9181 History of falling: Secondary | ICD-10-CM | POA: Diagnosis not present

## 2022-10-19 DIAGNOSIS — Z604 Social exclusion and rejection: Secondary | ICD-10-CM | POA: Diagnosis not present

## 2022-10-19 DIAGNOSIS — N179 Acute kidney failure, unspecified: Secondary | ICD-10-CM | POA: Diagnosis not present

## 2022-10-20 DIAGNOSIS — Z604 Social exclusion and rejection: Secondary | ICD-10-CM | POA: Diagnosis not present

## 2022-10-20 DIAGNOSIS — I081 Rheumatic disorders of both mitral and tricuspid valves: Secondary | ICD-10-CM | POA: Diagnosis not present

## 2022-10-20 DIAGNOSIS — Z556 Problems related to health literacy: Secondary | ICD-10-CM | POA: Diagnosis not present

## 2022-10-20 DIAGNOSIS — Z5982 Transportation insecurity: Secondary | ICD-10-CM | POA: Diagnosis not present

## 2022-10-20 DIAGNOSIS — D693 Immune thrombocytopenic purpura: Secondary | ICD-10-CM | POA: Diagnosis not present

## 2022-10-20 DIAGNOSIS — Z9181 History of falling: Secondary | ICD-10-CM | POA: Diagnosis not present

## 2022-10-20 DIAGNOSIS — I69354 Hemiplegia and hemiparesis following cerebral infarction affecting left non-dominant side: Secondary | ICD-10-CM | POA: Diagnosis not present

## 2022-10-20 DIAGNOSIS — E039 Hypothyroidism, unspecified: Secondary | ICD-10-CM | POA: Diagnosis not present

## 2022-10-20 DIAGNOSIS — E785 Hyperlipidemia, unspecified: Secondary | ICD-10-CM | POA: Diagnosis not present

## 2022-10-20 DIAGNOSIS — D62 Acute posthemorrhagic anemia: Secondary | ICD-10-CM | POA: Diagnosis not present

## 2022-10-20 DIAGNOSIS — I890 Lymphedema, not elsewhere classified: Secondary | ICD-10-CM | POA: Diagnosis not present

## 2022-10-20 DIAGNOSIS — N179 Acute kidney failure, unspecified: Secondary | ICD-10-CM | POA: Diagnosis not present

## 2022-10-20 DIAGNOSIS — H269 Unspecified cataract: Secondary | ICD-10-CM | POA: Diagnosis not present

## 2022-10-20 DIAGNOSIS — K922 Gastrointestinal hemorrhage, unspecified: Secondary | ICD-10-CM | POA: Diagnosis not present

## 2022-10-20 DIAGNOSIS — Z7902 Long term (current) use of antithrombotics/antiplatelets: Secondary | ICD-10-CM | POA: Diagnosis not present

## 2022-10-20 DIAGNOSIS — M47812 Spondylosis without myelopathy or radiculopathy, cervical region: Secondary | ICD-10-CM | POA: Diagnosis not present

## 2022-10-20 DIAGNOSIS — G20A1 Parkinson's disease without dyskinesia, without mention of fluctuations: Secondary | ICD-10-CM | POA: Diagnosis not present

## 2022-10-20 DIAGNOSIS — I10 Essential (primary) hypertension: Secondary | ICD-10-CM | POA: Diagnosis not present

## 2022-10-21 DIAGNOSIS — G903 Multi-system degeneration of the autonomic nervous system: Secondary | ICD-10-CM | POA: Diagnosis not present

## 2022-10-21 DIAGNOSIS — Z9989 Dependence on other enabling machines and devices: Secondary | ICD-10-CM | POA: Diagnosis not present

## 2022-10-21 DIAGNOSIS — M5136 Other intervertebral disc degeneration, lumbar region: Secondary | ICD-10-CM | POA: Diagnosis not present

## 2022-10-21 DIAGNOSIS — G20A1 Parkinson's disease without dyskinesia, without mention of fluctuations: Secondary | ICD-10-CM | POA: Diagnosis not present

## 2022-10-21 DIAGNOSIS — M47816 Spondylosis without myelopathy or radiculopathy, lumbar region: Secondary | ICD-10-CM | POA: Diagnosis not present

## 2022-10-21 DIAGNOSIS — M545 Low back pain, unspecified: Secondary | ICD-10-CM | POA: Diagnosis not present

## 2022-10-23 DIAGNOSIS — D693 Immune thrombocytopenic purpura: Secondary | ICD-10-CM | POA: Diagnosis not present

## 2022-10-23 DIAGNOSIS — K922 Gastrointestinal hemorrhage, unspecified: Secondary | ICD-10-CM | POA: Diagnosis not present

## 2022-10-23 DIAGNOSIS — I10 Essential (primary) hypertension: Secondary | ICD-10-CM | POA: Diagnosis not present

## 2022-10-23 DIAGNOSIS — Z9181 History of falling: Secondary | ICD-10-CM | POA: Diagnosis not present

## 2022-10-23 DIAGNOSIS — Z7902 Long term (current) use of antithrombotics/antiplatelets: Secondary | ICD-10-CM | POA: Diagnosis not present

## 2022-10-23 DIAGNOSIS — I69354 Hemiplegia and hemiparesis following cerebral infarction affecting left non-dominant side: Secondary | ICD-10-CM | POA: Diagnosis not present

## 2022-10-23 DIAGNOSIS — N179 Acute kidney failure, unspecified: Secondary | ICD-10-CM | POA: Diagnosis not present

## 2022-10-23 DIAGNOSIS — Z556 Problems related to health literacy: Secondary | ICD-10-CM | POA: Diagnosis not present

## 2022-10-23 DIAGNOSIS — D62 Acute posthemorrhagic anemia: Secondary | ICD-10-CM | POA: Diagnosis not present

## 2022-10-23 DIAGNOSIS — E785 Hyperlipidemia, unspecified: Secondary | ICD-10-CM | POA: Diagnosis not present

## 2022-10-23 DIAGNOSIS — H269 Unspecified cataract: Secondary | ICD-10-CM | POA: Diagnosis not present

## 2022-10-23 DIAGNOSIS — I890 Lymphedema, not elsewhere classified: Secondary | ICD-10-CM | POA: Diagnosis not present

## 2022-10-23 DIAGNOSIS — E039 Hypothyroidism, unspecified: Secondary | ICD-10-CM | POA: Diagnosis not present

## 2022-10-23 DIAGNOSIS — M47812 Spondylosis without myelopathy or radiculopathy, cervical region: Secondary | ICD-10-CM | POA: Diagnosis not present

## 2022-10-23 DIAGNOSIS — Z604 Social exclusion and rejection: Secondary | ICD-10-CM | POA: Diagnosis not present

## 2022-10-23 DIAGNOSIS — G20A1 Parkinson's disease without dyskinesia, without mention of fluctuations: Secondary | ICD-10-CM | POA: Diagnosis not present

## 2022-10-23 DIAGNOSIS — I081 Rheumatic disorders of both mitral and tricuspid valves: Secondary | ICD-10-CM | POA: Diagnosis not present

## 2022-10-23 DIAGNOSIS — Z5982 Transportation insecurity: Secondary | ICD-10-CM | POA: Diagnosis not present

## 2022-10-26 DIAGNOSIS — D62 Acute posthemorrhagic anemia: Secondary | ICD-10-CM | POA: Diagnosis not present

## 2022-10-26 DIAGNOSIS — G20A1 Parkinson's disease without dyskinesia, without mention of fluctuations: Secondary | ICD-10-CM | POA: Diagnosis not present

## 2022-10-26 DIAGNOSIS — Z5982 Transportation insecurity: Secondary | ICD-10-CM | POA: Diagnosis not present

## 2022-10-26 DIAGNOSIS — Z604 Social exclusion and rejection: Secondary | ICD-10-CM | POA: Diagnosis not present

## 2022-10-26 DIAGNOSIS — Z556 Problems related to health literacy: Secondary | ICD-10-CM | POA: Diagnosis not present

## 2022-10-26 DIAGNOSIS — I890 Lymphedema, not elsewhere classified: Secondary | ICD-10-CM | POA: Diagnosis not present

## 2022-10-26 DIAGNOSIS — Z9181 History of falling: Secondary | ICD-10-CM | POA: Diagnosis not present

## 2022-10-26 DIAGNOSIS — Z7902 Long term (current) use of antithrombotics/antiplatelets: Secondary | ICD-10-CM | POA: Diagnosis not present

## 2022-10-26 DIAGNOSIS — K922 Gastrointestinal hemorrhage, unspecified: Secondary | ICD-10-CM | POA: Diagnosis not present

## 2022-10-26 DIAGNOSIS — I10 Essential (primary) hypertension: Secondary | ICD-10-CM | POA: Diagnosis not present

## 2022-10-26 DIAGNOSIS — N179 Acute kidney failure, unspecified: Secondary | ICD-10-CM | POA: Diagnosis not present

## 2022-10-26 DIAGNOSIS — I69354 Hemiplegia and hemiparesis following cerebral infarction affecting left non-dominant side: Secondary | ICD-10-CM | POA: Diagnosis not present

## 2022-10-26 DIAGNOSIS — E039 Hypothyroidism, unspecified: Secondary | ICD-10-CM | POA: Diagnosis not present

## 2022-10-26 DIAGNOSIS — D693 Immune thrombocytopenic purpura: Secondary | ICD-10-CM | POA: Diagnosis not present

## 2022-10-26 DIAGNOSIS — E785 Hyperlipidemia, unspecified: Secondary | ICD-10-CM | POA: Diagnosis not present

## 2022-10-26 DIAGNOSIS — H269 Unspecified cataract: Secondary | ICD-10-CM | POA: Diagnosis not present

## 2022-10-26 DIAGNOSIS — M47812 Spondylosis without myelopathy or radiculopathy, cervical region: Secondary | ICD-10-CM | POA: Diagnosis not present

## 2022-10-26 DIAGNOSIS — I081 Rheumatic disorders of both mitral and tricuspid valves: Secondary | ICD-10-CM | POA: Diagnosis not present

## 2022-10-28 DIAGNOSIS — I081 Rheumatic disorders of both mitral and tricuspid valves: Secondary | ICD-10-CM | POA: Diagnosis not present

## 2022-10-28 DIAGNOSIS — Z7902 Long term (current) use of antithrombotics/antiplatelets: Secondary | ICD-10-CM | POA: Diagnosis not present

## 2022-10-28 DIAGNOSIS — K922 Gastrointestinal hemorrhage, unspecified: Secondary | ICD-10-CM | POA: Diagnosis not present

## 2022-10-28 DIAGNOSIS — H269 Unspecified cataract: Secondary | ICD-10-CM | POA: Diagnosis not present

## 2022-10-28 DIAGNOSIS — D693 Immune thrombocytopenic purpura: Secondary | ICD-10-CM | POA: Diagnosis not present

## 2022-10-28 DIAGNOSIS — I890 Lymphedema, not elsewhere classified: Secondary | ICD-10-CM | POA: Diagnosis not present

## 2022-10-28 DIAGNOSIS — N179 Acute kidney failure, unspecified: Secondary | ICD-10-CM | POA: Diagnosis not present

## 2022-10-28 DIAGNOSIS — E039 Hypothyroidism, unspecified: Secondary | ICD-10-CM | POA: Diagnosis not present

## 2022-10-28 DIAGNOSIS — Z9181 History of falling: Secondary | ICD-10-CM | POA: Diagnosis not present

## 2022-10-28 DIAGNOSIS — M47812 Spondylosis without myelopathy or radiculopathy, cervical region: Secondary | ICD-10-CM | POA: Diagnosis not present

## 2022-10-28 DIAGNOSIS — E785 Hyperlipidemia, unspecified: Secondary | ICD-10-CM | POA: Diagnosis not present

## 2022-10-28 DIAGNOSIS — Z556 Problems related to health literacy: Secondary | ICD-10-CM | POA: Diagnosis not present

## 2022-10-28 DIAGNOSIS — D62 Acute posthemorrhagic anemia: Secondary | ICD-10-CM | POA: Diagnosis not present

## 2022-10-28 DIAGNOSIS — G20A1 Parkinson's disease without dyskinesia, without mention of fluctuations: Secondary | ICD-10-CM | POA: Diagnosis not present

## 2022-10-28 DIAGNOSIS — Z604 Social exclusion and rejection: Secondary | ICD-10-CM | POA: Diagnosis not present

## 2022-10-28 DIAGNOSIS — I69354 Hemiplegia and hemiparesis following cerebral infarction affecting left non-dominant side: Secondary | ICD-10-CM | POA: Diagnosis not present

## 2022-10-28 DIAGNOSIS — Z5982 Transportation insecurity: Secondary | ICD-10-CM | POA: Diagnosis not present

## 2022-10-28 DIAGNOSIS — I10 Essential (primary) hypertension: Secondary | ICD-10-CM | POA: Diagnosis not present

## 2022-10-29 DIAGNOSIS — E785 Hyperlipidemia, unspecified: Secondary | ICD-10-CM | POA: Diagnosis not present

## 2022-10-29 DIAGNOSIS — D693 Immune thrombocytopenic purpura: Secondary | ICD-10-CM | POA: Diagnosis not present

## 2022-10-29 DIAGNOSIS — Z9181 History of falling: Secondary | ICD-10-CM | POA: Diagnosis not present

## 2022-10-29 DIAGNOSIS — I890 Lymphedema, not elsewhere classified: Secondary | ICD-10-CM | POA: Diagnosis not present

## 2022-10-29 DIAGNOSIS — G20A1 Parkinson's disease without dyskinesia, without mention of fluctuations: Secondary | ICD-10-CM | POA: Diagnosis not present

## 2022-10-29 DIAGNOSIS — I69354 Hemiplegia and hemiparesis following cerebral infarction affecting left non-dominant side: Secondary | ICD-10-CM | POA: Diagnosis not present

## 2022-10-29 DIAGNOSIS — Z556 Problems related to health literacy: Secondary | ICD-10-CM | POA: Diagnosis not present

## 2022-10-29 DIAGNOSIS — M47812 Spondylosis without myelopathy or radiculopathy, cervical region: Secondary | ICD-10-CM | POA: Diagnosis not present

## 2022-10-29 DIAGNOSIS — E039 Hypothyroidism, unspecified: Secondary | ICD-10-CM | POA: Diagnosis not present

## 2022-10-29 DIAGNOSIS — I081 Rheumatic disorders of both mitral and tricuspid valves: Secondary | ICD-10-CM | POA: Diagnosis not present

## 2022-10-29 DIAGNOSIS — Z604 Social exclusion and rejection: Secondary | ICD-10-CM | POA: Diagnosis not present

## 2022-10-29 DIAGNOSIS — D62 Acute posthemorrhagic anemia: Secondary | ICD-10-CM | POA: Diagnosis not present

## 2022-10-29 DIAGNOSIS — Z7902 Long term (current) use of antithrombotics/antiplatelets: Secondary | ICD-10-CM | POA: Diagnosis not present

## 2022-10-29 DIAGNOSIS — Z5982 Transportation insecurity: Secondary | ICD-10-CM | POA: Diagnosis not present

## 2022-10-29 DIAGNOSIS — H269 Unspecified cataract: Secondary | ICD-10-CM | POA: Diagnosis not present

## 2022-10-29 DIAGNOSIS — K922 Gastrointestinal hemorrhage, unspecified: Secondary | ICD-10-CM | POA: Diagnosis not present

## 2022-10-29 DIAGNOSIS — I10 Essential (primary) hypertension: Secondary | ICD-10-CM | POA: Diagnosis not present

## 2022-10-29 DIAGNOSIS — N179 Acute kidney failure, unspecified: Secondary | ICD-10-CM | POA: Diagnosis not present

## 2022-11-03 ENCOUNTER — Inpatient Hospital Stay: Payer: Medicare Other | Attending: Hematology and Oncology

## 2022-11-03 ENCOUNTER — Inpatient Hospital Stay: Payer: Medicare Other | Admitting: Hematology and Oncology

## 2022-11-03 VITALS — BP 115/54 | HR 72 | Temp 97.7°F | Resp 18 | Ht 69.0 in | Wt 123.8 lb

## 2022-11-03 DIAGNOSIS — D500 Iron deficiency anemia secondary to blood loss (chronic): Secondary | ICD-10-CM | POA: Insufficient documentation

## 2022-11-03 DIAGNOSIS — D473 Essential (hemorrhagic) thrombocythemia: Secondary | ICD-10-CM

## 2022-11-03 DIAGNOSIS — Z7982 Long term (current) use of aspirin: Secondary | ICD-10-CM | POA: Diagnosis not present

## 2022-11-03 DIAGNOSIS — Z79899 Other long term (current) drug therapy: Secondary | ICD-10-CM | POA: Insufficient documentation

## 2022-11-03 LAB — CBC WITH DIFFERENTIAL (CANCER CENTER ONLY)
Abs Immature Granulocytes: 0.04 10*3/uL (ref 0.00–0.07)
Basophils Absolute: 0.1 10*3/uL (ref 0.0–0.1)
Basophils Relative: 1 %
Eosinophils Absolute: 0.8 10*3/uL — ABNORMAL HIGH (ref 0.0–0.5)
Eosinophils Relative: 8 %
HCT: 35.2 % — ABNORMAL LOW (ref 36.0–46.0)
Hemoglobin: 10.4 g/dL — ABNORMAL LOW (ref 12.0–15.0)
Immature Granulocytes: 0 %
Lymphocytes Relative: 8 %
Lymphs Abs: 0.8 10*3/uL (ref 0.7–4.0)
MCH: 26.6 pg (ref 26.0–34.0)
MCHC: 29.5 g/dL — ABNORMAL LOW (ref 30.0–36.0)
MCV: 90 fL (ref 80.0–100.0)
Monocytes Absolute: 0.7 10*3/uL (ref 0.1–1.0)
Monocytes Relative: 7 %
Neutro Abs: 7.6 10*3/uL (ref 1.7–7.7)
Neutrophils Relative %: 76 %
Platelet Count: 516 10*3/uL — ABNORMAL HIGH (ref 150–400)
RBC: 3.91 MIL/uL (ref 3.87–5.11)
RDW: 15.5 % (ref 11.5–15.5)
WBC Count: 10 10*3/uL (ref 4.0–10.5)
nRBC: 0 % (ref 0.0–0.2)

## 2022-11-03 NOTE — Assessment & Plan Note (Addendum)
Essential thrombocytosis with JAK2 mutation: Continue Hydrea 500 mg daily and also aspirin 325 mg by mouth once daily.  Current dose: Hydroxyurea 500 mg Monday Wednesday Friday changed to twice a week from 03/08/2019   Hospitalization 04/17/2016 to 04/22/2016: Possible PRES Posterior reversible encephalopathy syndrome (due to seizure) VS Complicated Migraine   Hydrea toxicities: 1. Fatigue   2. Hair loss   3. brittle nails    Hospitalization for posterior reversible encephalopathy syndrome Hospitalization 08/13/2022-08/18/2022: Acute CVA, upper GI bleed and anemia from blood loss  Lab review:  09/05/2018: White count 8.5, hemoglobin 12.4, platelets 469, previously they were 465. 03/08/2019: WBC 7.2, hemoglobin 12.1, platelets 430 09/06/2019: WBC 9.3, MCV 101.3, platelets 541 03/07/2020: WBC 7.7, platelets 544 09/05/20: WBC 8.4, hemoglobin 12.1, platelets 528 04/03/2021: WBC 8, hemoglobin 12.5, platelets 540 10/03/2021: WBC 10.6, hemoglobin 12.4, platelets 786 10/30/2021: WBC 10.2, hemoglobin 12.2, platelets 712 08/17/2022: WBC 17.7, hemoglobin 8, platelets 490 (during hospitalization for CVA and GI bleed) 11/03/2022: WBC 10, hemoglobin 10.4, MCV 90, platelets 516  Patient is recovering from the recent hospitalization and severe anemia.   Continue with hydroxyurea 3 times a week.  Follow-up in 3 months with labs

## 2022-11-03 NOTE — Progress Notes (Signed)
Patient Care Team: Orpha Bur, MD as PCP - General (Family Medicine)  DIAGNOSIS:  Encounter Diagnosis  Name Primary?   Essential thrombocytosis (HCC) Yes      CHIEF COMPLIANT: Follow-up thrombocytosis  INTERVAL HISTORY: Deanna Hill is a 82 y.o. with above-mentioned history of essential thrombocytosis who is currently on hydrea. She presents to the clinic today for follow-up. Patient reports that she went to the hospital due to a fall. She complains of lower back pain from the fall.   ALLERGIES:  is allergic to iodine, prednisone, shellfish allergy, covid-19 mrna vacc (moderna), gabapentin, meloxicam, other, topiramate, and zoloft [sertraline].  MEDICATIONS:  Current Outpatient Medications  Medication Sig Dispense Refill   acetaminophen (TYLENOL) 325 MG tablet Take 2 tablets (650 mg total) by mouth every 6 (six) hours as needed for mild pain or moderate pain (or Fever >/= 101).     amLODipine (NORVASC) 10 MG tablet Take 1 tablet (10 mg total) by mouth daily.     BIOTIN PO Take 1 capsule by mouth daily.     carbidopa-levodopa (SINEMET IR) 25-100 MG tablet Take 2 at 7am/2 at 11am/1 at 3pm (Patient taking differently: Take 1-2 tablets by mouth See admin instructions. Take 2 tablets by mouth at 0700, then take  2 tablets by mouth  at 1100, and then take  1 tablet by mouth at 1500.) 450 tablet 0   Cholecalciferol (VITAMIN D) 2000 UNITS CAPS Take 1 capsule by mouth daily.     clopidogrel (PLAVIX) 75 MG tablet Take 1 tablet (75 mg total) by mouth daily. 90 tablet 1   hydroxyurea (HYDREA) 500 MG capsule TAKE ONE CAPSULE BY MOUTH THREE TIMES A WEEK. MAY take with food. (Patient taking differently: Take 500 mg by mouth See admin instructions. Take 500 mg (1 capsule) by mouth three times a week.) 13 capsule 3   levothyroxine (SYNTHROID, LEVOTHROID) 88 MCG tablet Take 88 mcg by mouth daily.     metoprolol tartrate (LOPRESSOR) 25 MG tablet Take 1 tablet (25 mg total) by mouth 2 (two)  times daily.     nitroGLYCERIN (NITROSTAT) 0.4 MG SL tablet Place 1 tablet (0.4 mg total) under the tongue every 5 (five) minutes as needed for chest pain. 25 tablet 6   pantoprazole (PROTONIX) 40 MG tablet Take 1 tablet (40 mg total) by mouth 2 (two) times daily.     Probiotic Product (PROBIOTIC PO) Take 1 tablet by mouth daily.     rosuvastatin (CRESTOR) 10 MG tablet Take 1 tablet (10 mg total) by mouth daily. 90 tablet 1   No current facility-administered medications for this visit.    PHYSICAL EXAMINATION: ECOG PERFORMANCE STATUS: 1 - Symptomatic but completely ambulatory  Vitals:   11/03/22 1443  BP: (!) 115/54  Pulse: 72  Resp: 18  Temp: 97.7 F (36.5 C)  SpO2: 100%   Filed Weights   11/03/22 1443  Weight: 123 lb 12.8 oz (56.2 kg)      LABORATORY DATA:  I have reviewed the data as listed    Latest Ref Rng & Units 08/16/2022    5:55 AM 08/15/2022    5:37 AM 08/14/2022    4:39 AM  CMP  Glucose 70 - 99 mg/dL 161  096  045   BUN 8 - 23 mg/dL 41  57  80   Creatinine 0.44 - 1.00 mg/dL 4.09  8.11  9.14   Sodium 135 - 145 mmol/L 143  142  141   Potassium 3.5 -  5.1 mmol/L 3.7  3.9  4.2   Chloride 98 - 111 mmol/L 110  111  110   CO2 22 - 32 mmol/L 24  25  23    Calcium 8.9 - 10.3 mg/dL 8.0  7.9  8.2   Total Protein 6.5 - 8.1 g/dL   5.0   Total Bilirubin 0.3 - 1.2 mg/dL   1.0   Alkaline Phos 38 - 126 U/L   31   AST 15 - 41 U/L   16   ALT 0 - 44 U/L   17     Lab Results  Component Value Date   WBC 10.0 11/03/2022   HGB 10.4 (L) 11/03/2022   HCT 35.2 (L) 11/03/2022   MCV 90.0 11/03/2022   PLT 516 (H) 11/03/2022   NEUTROABS 7.6 11/03/2022    ASSESSMENT & PLAN:  Essential thrombocytosis (HCC) Essential thrombocytosis with JAK2 mutation: Continue Hydrea 500 mg daily and also aspirin 325 mg by mouth once daily.  Current dose: Hydroxyurea 500 mg Monday Wednesday Friday changed to twice a week from 03/08/2019   Hospitalization 04/17/2016 to 04/22/2016: Possible PRES  Posterior reversible encephalopathy syndrome (due to seizure) VS Complicated Migraine   Hydrea toxicities: 1. Fatigue   2. Hair loss   3. brittle nails    Hospitalization for posterior reversible encephalopathy syndrome Hospitalization 08/13/2022-08/18/2022: Acute CVA, upper GI bleed and anemia from blood loss  Lab review:  09/05/2018: White count 8.5, hemoglobin 12.4, platelets 469, previously they were 465. 03/08/2019: WBC 7.2, hemoglobin 12.1, platelets 430 09/06/2019: WBC 9.3, MCV 101.3, platelets 541 03/07/2020: WBC 7.7, platelets 544 09/05/20: WBC 8.4, hemoglobin 12.1, platelets 528 04/03/2021: WBC 8, hemoglobin 12.5, platelets 540 10/03/2021: WBC 10.6, hemoglobin 12.4, platelets 786 10/30/2021: WBC 10.2, hemoglobin 12.2, platelets 712 08/17/2022: WBC 17.7, hemoglobin 8, platelets 490 (during hospitalization for CVA and GI bleed) 11/03/2022: WBC 10, hemoglobin 10.4, MCV 90, platelets 516  Patient is recovering from the recent hospitalization and severe anemia.   Continue with hydroxyurea 3 times a week.  Follow-up in 3 months with labs   Orders Placed This Encounter  Procedures   CBC with Differential (Cancer Center Only)    Standing Status:   Future    Standing Expiration Date:   11/03/2023   The patient has a good understanding of the overall plan. she agrees with it. she will call with any problems that may develop before the next visit here. Total time spent: 30 mins including face to face time and time spent for planning, charting and co-ordination of care   Tamsen Meek, MD 11/03/22    I Janan Ridge am acting as a Neurosurgeon for [employer]  I have reviewed the above documentation for accuracy and completeness, and I agree with the above.

## 2022-11-04 DIAGNOSIS — I890 Lymphedema, not elsewhere classified: Secondary | ICD-10-CM | POA: Diagnosis not present

## 2022-11-04 DIAGNOSIS — H269 Unspecified cataract: Secondary | ICD-10-CM | POA: Diagnosis not present

## 2022-11-04 DIAGNOSIS — D693 Immune thrombocytopenic purpura: Secondary | ICD-10-CM | POA: Diagnosis not present

## 2022-11-04 DIAGNOSIS — M47812 Spondylosis without myelopathy or radiculopathy, cervical region: Secondary | ICD-10-CM | POA: Diagnosis not present

## 2022-11-04 DIAGNOSIS — Z7902 Long term (current) use of antithrombotics/antiplatelets: Secondary | ICD-10-CM | POA: Diagnosis not present

## 2022-11-04 DIAGNOSIS — D62 Acute posthemorrhagic anemia: Secondary | ICD-10-CM | POA: Diagnosis not present

## 2022-11-04 DIAGNOSIS — Z5982 Transportation insecurity: Secondary | ICD-10-CM | POA: Diagnosis not present

## 2022-11-04 DIAGNOSIS — I10 Essential (primary) hypertension: Secondary | ICD-10-CM | POA: Diagnosis not present

## 2022-11-04 DIAGNOSIS — Z9181 History of falling: Secondary | ICD-10-CM | POA: Diagnosis not present

## 2022-11-04 DIAGNOSIS — G20A1 Parkinson's disease without dyskinesia, without mention of fluctuations: Secondary | ICD-10-CM | POA: Diagnosis not present

## 2022-11-04 DIAGNOSIS — Z556 Problems related to health literacy: Secondary | ICD-10-CM | POA: Diagnosis not present

## 2022-11-04 DIAGNOSIS — N179 Acute kidney failure, unspecified: Secondary | ICD-10-CM | POA: Diagnosis not present

## 2022-11-04 DIAGNOSIS — E039 Hypothyroidism, unspecified: Secondary | ICD-10-CM | POA: Diagnosis not present

## 2022-11-04 DIAGNOSIS — E785 Hyperlipidemia, unspecified: Secondary | ICD-10-CM | POA: Diagnosis not present

## 2022-11-04 DIAGNOSIS — I69354 Hemiplegia and hemiparesis following cerebral infarction affecting left non-dominant side: Secondary | ICD-10-CM | POA: Diagnosis not present

## 2022-11-04 DIAGNOSIS — Z604 Social exclusion and rejection: Secondary | ICD-10-CM | POA: Diagnosis not present

## 2022-11-04 DIAGNOSIS — K922 Gastrointestinal hemorrhage, unspecified: Secondary | ICD-10-CM | POA: Diagnosis not present

## 2022-11-04 DIAGNOSIS — I081 Rheumatic disorders of both mitral and tricuspid valves: Secondary | ICD-10-CM | POA: Diagnosis not present

## 2022-11-05 DIAGNOSIS — E039 Hypothyroidism, unspecified: Secondary | ICD-10-CM | POA: Diagnosis not present

## 2022-11-05 DIAGNOSIS — Z9181 History of falling: Secondary | ICD-10-CM | POA: Diagnosis not present

## 2022-11-05 DIAGNOSIS — E785 Hyperlipidemia, unspecified: Secondary | ICD-10-CM | POA: Diagnosis not present

## 2022-11-05 DIAGNOSIS — I890 Lymphedema, not elsewhere classified: Secondary | ICD-10-CM | POA: Diagnosis not present

## 2022-11-05 DIAGNOSIS — I69354 Hemiplegia and hemiparesis following cerebral infarction affecting left non-dominant side: Secondary | ICD-10-CM | POA: Diagnosis not present

## 2022-11-05 DIAGNOSIS — Z604 Social exclusion and rejection: Secondary | ICD-10-CM | POA: Diagnosis not present

## 2022-11-05 DIAGNOSIS — Z7902 Long term (current) use of antithrombotics/antiplatelets: Secondary | ICD-10-CM | POA: Diagnosis not present

## 2022-11-05 DIAGNOSIS — Z556 Problems related to health literacy: Secondary | ICD-10-CM | POA: Diagnosis not present

## 2022-11-05 DIAGNOSIS — G20A1 Parkinson's disease without dyskinesia, without mention of fluctuations: Secondary | ICD-10-CM | POA: Diagnosis not present

## 2022-11-05 DIAGNOSIS — H269 Unspecified cataract: Secondary | ICD-10-CM | POA: Diagnosis not present

## 2022-11-05 DIAGNOSIS — Z5982 Transportation insecurity: Secondary | ICD-10-CM | POA: Diagnosis not present

## 2022-11-05 DIAGNOSIS — I10 Essential (primary) hypertension: Secondary | ICD-10-CM | POA: Diagnosis not present

## 2022-11-05 DIAGNOSIS — N179 Acute kidney failure, unspecified: Secondary | ICD-10-CM | POA: Diagnosis not present

## 2022-11-05 DIAGNOSIS — I081 Rheumatic disorders of both mitral and tricuspid valves: Secondary | ICD-10-CM | POA: Diagnosis not present

## 2022-11-05 DIAGNOSIS — K922 Gastrointestinal hemorrhage, unspecified: Secondary | ICD-10-CM | POA: Diagnosis not present

## 2022-11-05 DIAGNOSIS — D693 Immune thrombocytopenic purpura: Secondary | ICD-10-CM | POA: Diagnosis not present

## 2022-11-05 DIAGNOSIS — D62 Acute posthemorrhagic anemia: Secondary | ICD-10-CM | POA: Diagnosis not present

## 2022-11-05 DIAGNOSIS — M47812 Spondylosis without myelopathy or radiculopathy, cervical region: Secondary | ICD-10-CM | POA: Diagnosis not present

## 2022-12-23 DIAGNOSIS — M545 Low back pain, unspecified: Secondary | ICD-10-CM | POA: Diagnosis not present

## 2022-12-23 DIAGNOSIS — R0789 Other chest pain: Secondary | ICD-10-CM | POA: Diagnosis not present

## 2022-12-23 DIAGNOSIS — Z23 Encounter for immunization: Secondary | ICD-10-CM | POA: Diagnosis not present

## 2022-12-23 DIAGNOSIS — Z9989 Dependence on other enabling machines and devices: Secondary | ICD-10-CM | POA: Diagnosis not present

## 2022-12-23 DIAGNOSIS — Z681 Body mass index (BMI) 19 or less, adult: Secondary | ICD-10-CM | POA: Diagnosis not present

## 2022-12-23 DIAGNOSIS — R0989 Other specified symptoms and signs involving the circulatory and respiratory systems: Secondary | ICD-10-CM | POA: Diagnosis not present

## 2023-01-26 NOTE — Progress Notes (Unsigned)
Assessment/Plan:   1.  Parkinsons Disease  -Discussed concept of levodopa resistant tremor.   -Continue carbidopa/levodopa 25/100, 2 at 7am, 2 at 11am,  and move the last to 3pm to see if it helps the anxiety that she feels normal 3:30 PM (currently dosing last at 4:30 PM).  She was seen right at 330 today and stated that she had the inner feeling, but also had dyskinesia (which she states is not bothersome) -her Parkinsons Disease actually looks quite good.  Encouraged her to keep up the exercise.  I really think it is helping her.  2.  GAD/depression  -She stopped the escitalopram by primary care as she felt it created side effect.  Still think that this is an issue and potentially she could use something for this.  3.  History of chronic vertigo/dizziness/nausea  -follows with [facility]  -chronic  -preceeded the addition of carbidopa/levodopa   4.  History of PRES  -Treated by Dr. Pearlean Brownie in 2018  5.  Essential thrombocytosis  -on hydroxyurea  6.  Insomnia  -doesn't want further meds  -recommended trial melatonin, 3 mg nightly 7.  Parkinsons Disease dyskinesia  -not bothersome to patient and we will hold further medication for that  8.  Corpus callosum infarct, May, 2024  -On Plavix, 75 mg  -LDL 94 at the time of the event.  Now on Crestor, 10 mg  9.  Parafalcine meningioma  -Incidental finding, 11 mm  Subjective:   Deanna Hill was seen today in follow up for Parkinsons disease, diagnosed last visit.  My previous records were reviewed prior to todays visit as well as outside records available to me. Pt with daughter who supplements the history.   Much has happened since our last visit.  I last saw her at the beginning of May.  2 weeks later, she went to the hospital with complaints of headache and stated that she felt like she did when she had PRES in the past.  MRI demonstrated a subacute right corpus callosal infarct.  Contrasted MRI demonstrated an 11 mm right  parafalcine meningioma.  Aspirin/Plavix was recommended for 3 weeks and then Plavix monotherapy.  Crestor was added.  Left ventricular ejection fraction was 65 to 70%.  CTA head and neck with no large vessel occlusion.  Unfortunately, we were not made aware that patient was in the hospital/discharged.  10 days after discharge, the patient was back in the hospital with weakness and falls and dark stools.  Patient was admitted with acute GI bleed, presenting with a hemoglobin of 7.1.  Current prescribed movement disorder medications: carbidopa/levodopa 25/100 2 at 7am, 2 at 11am, 1 at 4pm  Current/Previously tried tremor medications: Topamax 25 mg (was on it for headache post PRES but thought it helped tremor); clonazepam (hangover effect); propranolol; Zonegran is on her list of not tolerated medications, but records indicate that she was given it, but she never tried it because she was scared of potential side effects; primidone, tried at very low dose for just a few weeks and reported possible weakness; lexapro 5 mg (she states that she didn't feel well)    ALLERGIES:   Allergies  Allergen Reactions   Iodine Shortness Of Breath and Other (See Comments)    Pt states "Deadly"   Prednisone     Facial flushing and swelling   Shellfish Allergy Anaphylaxis and Other (See Comments)    " I almost died once"    Covid-19 Mrna Vacc (Moderna) Other (  See Comments)    unknown   Gabapentin Other (See Comments)    unknown   Meloxicam Other (See Comments)    unknown   Other Other (See Comments)    unknown   Topiramate Other (See Comments)    unknown   Zoloft [Sertraline] Other (See Comments)    unknown    CURRENT MEDICATIONS:  No outpatient medications have been marked as taking for the 01/28/23 encounter (Appointment) with Lexus Barletta, Octaviano Batty, DO.     Objective:   PHYSICAL EXAMINATION:    VITALS:   There were no vitals filed for this visit.    GEN:  The patient appears stated age and is in  NAD. HEENT:  Normocephalic, atraumatic.  The mucous membranes are moist. The superficial temporal arteries are without ropiness or tenderness.   Neurological examination:  Orientation: The patient is alert and oriented x3. Cranial nerves: There is good facial symmetry with slight facial hypomimia. The speech is fluent and clear. Soft palate rises symmetrically and there is no tongue deviation. Hearing is intact to conversational tone. Sensation: Sensation is intact to light touch throughout Motor: Strength is at least antigravity x4.  Movement examination: Tone: Normal tone in the upper and lower extremities today. Abnormal movements: there is mild dyskinesia, L >R Coordination:  There is no decremation, with any form of RAMS, including alternating supination and pronation of the forearm, hand opening and closing, finger taps, heel taps and toe taps. Gait and Station: The patient pushes off the chair to arise.  She ambulates pretty well with her cane.    I have reviewed and interpreted the following labs independently    Chemistry      Component Value Date/Time   NA 143 08/16/2022 0555   NA 140 01/18/2017 1029   K 3.7 08/16/2022 0555   K 4.1 01/18/2017 1029   CL 110 08/16/2022 0555   CL 107 06/24/2012 0859   CO2 24 08/16/2022 0555   CO2 25 01/18/2017 1029   BUN 41 (H) 08/16/2022 0555   BUN 21.7 01/18/2017 1029   CREATININE 1.07 (H) 08/16/2022 0555   CREATININE 1.04 (H) 05/04/2022 1423   CREATININE 1.0 01/18/2017 1029      Component Value Date/Time   CALCIUM 8.0 (L) 08/16/2022 0555   CALCIUM 9.3 01/18/2017 1029   ALKPHOS 31 (L) 08/14/2022 0439   ALKPHOS 54 01/18/2017 1029   AST 16 08/14/2022 0439   AST 11 (L) 05/04/2022 1423   AST 14 01/18/2017 1029   ALT 17 08/14/2022 0439   ALT <5 05/04/2022 1423   ALT 11 01/18/2017 1029   BILITOT 1.0 08/14/2022 0439   BILITOT 0.4 05/04/2022 1423   BILITOT 0.31 01/18/2017 1029       Lab Results  Component Value Date   WBC 10.0  11/03/2022   HGB 10.4 (L) 11/03/2022   HCT 35.2 (L) 11/03/2022   MCV 90.0 11/03/2022   PLT 516 (H) 11/03/2022    Lab Results  Component Value Date   TSH 0.487 08/14/2022   Total time spent on today's visit was *** minutes, including both face-to-face time and nonface-to-face time.  Time included that spent on review of records (prior notes available to me/labs/imaging if pertinent), discussing treatment and goals, answering patient's questions and coordinating care.    Cc:  Orpha Bur, MD

## 2023-01-28 ENCOUNTER — Ambulatory Visit: Payer: Medicare Other | Admitting: Neurology

## 2023-01-28 ENCOUNTER — Encounter: Payer: Self-pay | Admitting: Neurology

## 2023-01-28 ENCOUNTER — Other Ambulatory Visit: Payer: Self-pay

## 2023-01-28 VITALS — BP 128/70 | HR 70 | Wt 120.0 lb

## 2023-01-28 DIAGNOSIS — G20B2 Parkinson's disease with dyskinesia, with fluctuations: Secondary | ICD-10-CM

## 2023-01-28 DIAGNOSIS — R634 Abnormal weight loss: Secondary | ICD-10-CM

## 2023-01-28 DIAGNOSIS — M545 Low back pain, unspecified: Secondary | ICD-10-CM

## 2023-01-28 DIAGNOSIS — G8929 Other chronic pain: Secondary | ICD-10-CM | POA: Diagnosis not present

## 2023-01-28 MED ORDER — CARBIDOPA-LEVODOPA 25-100 MG PO TABS: ORAL_TABLET | ORAL | 1 refills | Status: DC

## 2023-01-28 MED ORDER — CARBIDOPA-LEVODOPA 25-100 MG PO TABS
ORAL_TABLET | ORAL | 0 refills | Status: DC
Start: 2023-01-28 — End: 2023-03-02

## 2023-01-28 NOTE — Patient Instructions (Addendum)
Take carbidopa/levodopa 25/100, 2 at 7am/11am/3pm     As a reminder, carbidopa/levodopa can be taken at the same time as a carbohydrate, but we like to have you take your pill either 30 minutes before a protein source or 1 hour after as protein can interfere with carbidopa/levodopa absorption.  I want you to look at high protein brands like Kodiak and Halo Top (ice cream).    We discussed aqua physical therapy.  Let me know if I can refer you

## 2023-02-02 ENCOUNTER — Inpatient Hospital Stay: Payer: Medicare Other | Admitting: Hematology and Oncology

## 2023-02-02 ENCOUNTER — Inpatient Hospital Stay: Payer: Medicare Other | Attending: Hematology and Oncology

## 2023-02-02 VITALS — BP 117/53 | HR 70 | Temp 98.6°F | Resp 18 | Ht 69.0 in | Wt 120.7 lb

## 2023-02-02 DIAGNOSIS — Z79899 Other long term (current) drug therapy: Secondary | ICD-10-CM | POA: Diagnosis not present

## 2023-02-02 DIAGNOSIS — L603 Nail dystrophy: Secondary | ICD-10-CM | POA: Diagnosis not present

## 2023-02-02 DIAGNOSIS — R5383 Other fatigue: Secondary | ICD-10-CM | POA: Insufficient documentation

## 2023-02-02 DIAGNOSIS — D473 Essential (hemorrhagic) thrombocythemia: Secondary | ICD-10-CM

## 2023-02-02 DIAGNOSIS — Z8673 Personal history of transient ischemic attack (TIA), and cerebral infarction without residual deficits: Secondary | ICD-10-CM | POA: Diagnosis not present

## 2023-02-02 DIAGNOSIS — R634 Abnormal weight loss: Secondary | ICD-10-CM | POA: Insufficient documentation

## 2023-02-02 DIAGNOSIS — L659 Nonscarring hair loss, unspecified: Secondary | ICD-10-CM | POA: Insufficient documentation

## 2023-02-02 DIAGNOSIS — D471 Chronic myeloproliferative disease: Secondary | ICD-10-CM | POA: Insufficient documentation

## 2023-02-02 LAB — CBC WITH DIFFERENTIAL (CANCER CENTER ONLY)
Abs Immature Granulocytes: 0.04 10*3/uL (ref 0.00–0.07)
Basophils Absolute: 0.1 10*3/uL (ref 0.0–0.1)
Basophils Relative: 1 %
Eosinophils Absolute: 0.8 10*3/uL — ABNORMAL HIGH (ref 0.0–0.5)
Eosinophils Relative: 9 %
HCT: 36.6 % (ref 36.0–46.0)
Hemoglobin: 11.2 g/dL — ABNORMAL LOW (ref 12.0–15.0)
Immature Granulocytes: 1 %
Lymphocytes Relative: 8 %
Lymphs Abs: 0.7 10*3/uL (ref 0.7–4.0)
MCH: 28.5 pg (ref 26.0–34.0)
MCHC: 30.6 g/dL (ref 30.0–36.0)
MCV: 93.1 fL (ref 80.0–100.0)
Monocytes Absolute: 0.6 10*3/uL (ref 0.1–1.0)
Monocytes Relative: 7 %
Neutro Abs: 6.3 10*3/uL (ref 1.7–7.7)
Neutrophils Relative %: 74 %
Platelet Count: 525 10*3/uL — ABNORMAL HIGH (ref 150–400)
RBC: 3.93 MIL/uL (ref 3.87–5.11)
RDW: 18.5 % — ABNORMAL HIGH (ref 11.5–15.5)
WBC Count: 8.5 10*3/uL (ref 4.0–10.5)
nRBC: 0 % (ref 0.0–0.2)

## 2023-02-02 NOTE — Assessment & Plan Note (Addendum)
Essential thrombocytosis with JAK2 mutation: Continue Hydrea 500 mg daily and also aspirin 325 mg by mouth once daily.  Current dose: Hydroxyurea 500 mg Monday Wednesday Friday changed to twice a week from 03/08/2019   Hospitalization 04/17/2016 to 04/22/2016: Possible PRES Posterior reversible encephalopathy syndrome (due to seizure) VS Complicated Migraine   Hydrea toxicities: 1. Fatigue   2. Hair loss   3. brittle nails    Hospitalization for posterior reversible encephalopathy syndrome Hospitalization 08/13/2022-08/18/2022: Acute CVA, upper GI bleed and anemia from blood loss   Lab review:  09/05/2018: White count 8.5, hemoglobin 12.4, platelets 469, previously they were 465. 03/08/2019: WBC 7.2, hemoglobin 12.1, platelets 430 09/06/2019: WBC 9.3, MCV 101.3, platelets 541 03/07/2020: WBC 7.7, platelets 544 09/05/20: WBC 8.4, hemoglobin 12.1, platelets 528 04/03/2021: WBC 8, hemoglobin 12.5, platelets 540 10/03/2021: WBC 10.6, hemoglobin 12.4, platelets 786 10/30/2021: WBC 10.2, hemoglobin 12.2, platelets 712 08/17/2022: WBC 17.7, hemoglobin 8, platelets 490 (during hospitalization for CVA and GI bleed) 11/03/2022: WBC 10, hemoglobin 10.4, platelets 516 02/02/2023: WBC 8.5, hemoglobin 11.2, platelets 525     Continue with hydroxyurea 3 times a week.   Follow-up in 4 months with labs

## 2023-02-02 NOTE — Progress Notes (Signed)
Patient Care Team: Orpha Bur, MD as PCP - General (Family Medicine) Tat, Octaviano Batty, DO as Consulting Physician (Neurology)  DIAGNOSIS:  Encounter Diagnosis  Name Primary?   Essential thrombocytosis (HCC) Yes      CHIEF COMPLIANT: Follow-up on essential thrombocytosis  HISTORY OF PRESENT ILLNESS:   History of Present Illness   The patient, with a history of anemia and thrombocytosis managed with Hydrea three times a week, presents for a follow-up visit. She reports no new symptoms and is pleased with her current regimen.  However, she has experienced significant unintentional weight loss, approximately 30 pounds since last year. This weight loss is concerning as it is likely due to muscle loss rather than fat loss, which could further weaken the patient and impact her mobility.  The patient also reports a fall in May that resulted in hospitalization and has since been unable to drive due to weakness. She continues to exercise to maintain her mobility.         ALLERGIES:  is allergic to iodine, prednisone, shellfish allergy, covid-19 mrna vacc (moderna), gabapentin, meloxicam, other, topiramate, and zoloft [sertraline].  MEDICATIONS:  Current Outpatient Medications  Medication Sig Dispense Refill   acetaminophen (TYLENOL) 325 MG tablet Take 2 tablets (650 mg total) by mouth every 6 (six) hours as needed for mild pain or moderate pain (or Fever >/= 101).     amLODipine (NORVASC) 10 MG tablet Take 1 tablet (10 mg total) by mouth daily.     BIOTIN PO Take 1 capsule by mouth daily.     carbidopa-levodopa (SINEMET IR) 25-100 MG tablet 2 at 7am, 2 at 11am, 2 at 3pm 540 tablet 0   Cholecalciferol (VITAMIN D) 2000 UNITS CAPS Take 1 capsule by mouth daily.     clopidogrel (PLAVIX) 75 MG tablet Take 1 tablet (75 mg total) by mouth daily. 90 tablet 1   DULoxetine (CYMBALTA) 20 MG capsule Take 20 mg by mouth daily.     hydroxyurea (HYDREA) 500 MG capsule TAKE ONE CAPSULE BY MOUTH  THREE TIMES A WEEK. MAY take with food. (Patient taking differently: Take 500 mg by mouth See admin instructions. Take 500 mg (1 capsule) by mouth three times a week.) 13 capsule 3   levothyroxine (SYNTHROID, LEVOTHROID) 88 MCG tablet Take 88 mcg by mouth daily.     metoprolol tartrate (LOPRESSOR) 25 MG tablet Take 1 tablet (25 mg total) by mouth 2 (two) times daily.     nitroGLYCERIN (NITROSTAT) 0.4 MG SL tablet Place 1 tablet (0.4 mg total) under the tongue every 5 (five) minutes as needed for chest pain. 25 tablet 6   pantoprazole (PROTONIX) 40 MG tablet Take 1 tablet (40 mg total) by mouth 2 (two) times daily.     Probiotic Product (PROBIOTIC PO) Take 1 tablet by mouth daily.     rosuvastatin (CRESTOR) 10 MG tablet Take 1 tablet (10 mg total) by mouth daily. (Patient not taking: Reported on 01/28/2023) 90 tablet 1   No current facility-administered medications for this visit.    PHYSICAL EXAMINATION: ECOG PERFORMANCE STATUS: 1 - Symptomatic but completely ambulatory  Vitals:   02/02/23 1455  BP: (!) 117/53  Pulse: 70  Resp: 18  Temp: 98.6 F (37 C)  SpO2: 100%   Filed Weights   02/02/23 1455  Weight: 120 lb 11.2 oz (54.7 kg)    Physical Exam   MEASUREMENTS: WT- 119 MUSCULOSKELETAL: Decreased muscle mass in arms.      (exam performed in the  presence of a chaperone)  LABORATORY DATA:  I have reviewed the data as listed    Latest Ref Rng & Units 08/16/2022    5:55 AM 08/15/2022    5:37 AM 08/14/2022    4:39 AM  CMP  Glucose 70 - 99 mg/dL 161  096  045   BUN 8 - 23 mg/dL 41  57  80   Creatinine 0.44 - 1.00 mg/dL 4.09  8.11  9.14   Sodium 135 - 145 mmol/L 143  142  141   Potassium 3.5 - 5.1 mmol/L 3.7  3.9  4.2   Chloride 98 - 111 mmol/L 110  111  110   CO2 22 - 32 mmol/L 24  25  23    Calcium 8.9 - 10.3 mg/dL 8.0  7.9  8.2   Total Protein 6.5 - 8.1 g/dL   5.0   Total Bilirubin 0.3 - 1.2 mg/dL   1.0   Alkaline Phos 38 - 126 U/L   31   AST 15 - 41 U/L   16   ALT 0 - 44  U/L   17     Lab Results  Component Value Date   WBC 8.5 02/02/2023   HGB 11.2 (L) 02/02/2023   HCT 36.6 02/02/2023   MCV 93.1 02/02/2023   PLT 525 (H) 02/02/2023   NEUTROABS 6.3 02/02/2023    ASSESSMENT & PLAN:  Essential thrombocytosis (HCC) Essential thrombocytosis with JAK2 mutation: Continue Hydrea 500 mg daily and also aspirin 325 mg by mouth once daily.  Current dose: Hydroxyurea 500 mg Monday Wednesday Friday changed to twice a week from 03/08/2019   Hospitalization 04/17/2016 to 04/22/2016: Possible PRES Posterior reversible encephalopathy syndrome (due to seizure) VS Complicated Migraine   Hydrea toxicities: 1. Fatigue   2. Hair loss   3. brittle nails    Hospitalization for posterior reversible encephalopathy syndrome Hospitalization 08/13/2022-08/18/2022: Acute CVA, upper GI bleed and anemia from blood loss   Lab review:  09/05/2018: White count 8.5, hemoglobin 12.4, platelets 469, previously they were 465. 03/08/2019: WBC 7.2, hemoglobin 12.1, platelets 430 09/06/2019: WBC 9.3, MCV 101.3, platelets 541 03/07/2020: WBC 7.7, platelets 544 09/05/20: WBC 8.4, hemoglobin 12.1, platelets 528 04/03/2021: WBC 8, hemoglobin 12.5, platelets 540 10/03/2021: WBC 10.6, hemoglobin 12.4, platelets 786 10/30/2021: WBC 10.2, hemoglobin 12.2, platelets 712 08/17/2022: WBC 17.7, hemoglobin 8, platelets 490 (during hospitalization for CVA and GI bleed) 11/03/2022: WBC 10, hemoglobin 10.4, platelets 516 02/02/2023: WBC 8.5, hemoglobin 11.2, platelets 525     Continue with hydroxyurea 3 times a week.   Follow-up in 4 months with labs ------------------------------------- Assessment and Plan    Myeloproliferative Disorder Stable on Hydrea 3 times a week. Hemoglobin, platelet count, and white blood cell count within acceptable ranges. -Continue Hydrea 3 times a week. -Check labs in 4 months.  Unintentional Weight Loss Lost 30 pounds since last year without trying. Likely due to muscle  loss, contributing to weakness. -Encourage increased protein intake. -Continue physical exercise to maintain muscle mass and mobility.  General Health Maintenance -Schedule follow-up appointment in 4 months.          Orders Placed This Encounter  Procedures   CBC with Differential (Cancer Center Only)    Standing Status:   Future    Standing Expiration Date:   02/02/2024   CMP (Cancer Center only)    Standing Status:   Future    Standing Expiration Date:   02/02/2024   The patient has a good understanding of the  overall plan. she agrees with it. she will call with any problems that may develop before the next visit here. Total time spent: 30 mins including face to face time and time spent for planning, charting and co-ordination of care   Tamsen Meek, MD 02/02/23

## 2023-03-02 ENCOUNTER — Other Ambulatory Visit: Payer: Self-pay

## 2023-03-02 DIAGNOSIS — G20B2 Parkinson's disease with dyskinesia, with fluctuations: Secondary | ICD-10-CM

## 2023-03-02 MED ORDER — CARBIDOPA-LEVODOPA 25-100 MG PO TABS
ORAL_TABLET | ORAL | 0 refills | Status: DC
Start: 2023-03-02 — End: 2023-03-19

## 2023-03-19 ENCOUNTER — Other Ambulatory Visit: Payer: Self-pay

## 2023-03-19 DIAGNOSIS — G20B2 Parkinson's disease with dyskinesia, with fluctuations: Secondary | ICD-10-CM

## 2023-03-19 MED ORDER — CARBIDOPA-LEVODOPA 25-100 MG PO TABS
ORAL_TABLET | ORAL | 0 refills | Status: DC
Start: 2023-03-19 — End: 2023-06-02

## 2023-03-26 DIAGNOSIS — D473 Essential (hemorrhagic) thrombocythemia: Secondary | ICD-10-CM | POA: Diagnosis not present

## 2023-03-26 DIAGNOSIS — M545 Low back pain, unspecified: Secondary | ICD-10-CM | POA: Diagnosis not present

## 2023-03-26 DIAGNOSIS — N1831 Chronic kidney disease, stage 3a: Secondary | ICD-10-CM | POA: Diagnosis not present

## 2023-03-26 DIAGNOSIS — G903 Multi-system degeneration of the autonomic nervous system: Secondary | ICD-10-CM | POA: Diagnosis not present

## 2023-04-01 DIAGNOSIS — H04123 Dry eye syndrome of bilateral lacrimal glands: Secondary | ICD-10-CM | POA: Diagnosis not present

## 2023-04-01 DIAGNOSIS — H40013 Open angle with borderline findings, low risk, bilateral: Secondary | ICD-10-CM | POA: Diagnosis not present

## 2023-04-01 DIAGNOSIS — I6609 Occlusion and stenosis of unspecified middle cerebral artery: Secondary | ICD-10-CM | POA: Diagnosis not present

## 2023-04-01 DIAGNOSIS — H524 Presbyopia: Secondary | ICD-10-CM | POA: Diagnosis not present

## 2023-06-02 ENCOUNTER — Other Ambulatory Visit: Payer: Self-pay | Admitting: Neurology

## 2023-06-02 DIAGNOSIS — G20B2 Parkinson's disease with dyskinesia, with fluctuations: Secondary | ICD-10-CM

## 2023-06-07 ENCOUNTER — Inpatient Hospital Stay (HOSPITAL_BASED_OUTPATIENT_CLINIC_OR_DEPARTMENT_OTHER): Payer: Medicare Other | Admitting: Hematology and Oncology

## 2023-06-07 ENCOUNTER — Inpatient Hospital Stay: Payer: Medicare Other | Attending: Hematology and Oncology

## 2023-06-07 VITALS — BP 142/75 | HR 81 | Temp 98.2°F | Resp 18 | Ht 69.0 in | Wt 119.3 lb

## 2023-06-07 DIAGNOSIS — D473 Essential (hemorrhagic) thrombocythemia: Secondary | ICD-10-CM | POA: Diagnosis not present

## 2023-06-07 DIAGNOSIS — G20A1 Parkinson's disease without dyskinesia, without mention of fluctuations: Secondary | ICD-10-CM | POA: Diagnosis not present

## 2023-06-07 DIAGNOSIS — L659 Nonscarring hair loss, unspecified: Secondary | ICD-10-CM | POA: Insufficient documentation

## 2023-06-07 DIAGNOSIS — M419 Scoliosis, unspecified: Secondary | ICD-10-CM | POA: Insufficient documentation

## 2023-06-07 DIAGNOSIS — R5383 Other fatigue: Secondary | ICD-10-CM | POA: Insufficient documentation

## 2023-06-07 DIAGNOSIS — D649 Anemia, unspecified: Secondary | ICD-10-CM | POA: Diagnosis not present

## 2023-06-07 DIAGNOSIS — L603 Nail dystrophy: Secondary | ICD-10-CM | POA: Diagnosis not present

## 2023-06-07 DIAGNOSIS — Z79899 Other long term (current) drug therapy: Secondary | ICD-10-CM | POA: Insufficient documentation

## 2023-06-07 LAB — CBC WITH DIFFERENTIAL (CANCER CENTER ONLY)
Abs Immature Granulocytes: 0.04 10*3/uL (ref 0.00–0.07)
Basophils Absolute: 0.1 10*3/uL (ref 0.0–0.1)
Basophils Relative: 1 %
Eosinophils Absolute: 0.8 10*3/uL — ABNORMAL HIGH (ref 0.0–0.5)
Eosinophils Relative: 10 %
HCT: 35.5 % — ABNORMAL LOW (ref 36.0–46.0)
Hemoglobin: 11.1 g/dL — ABNORMAL LOW (ref 12.0–15.0)
Immature Granulocytes: 1 %
Lymphocytes Relative: 9 %
Lymphs Abs: 0.8 10*3/uL (ref 0.7–4.0)
MCH: 32.3 pg (ref 26.0–34.0)
MCHC: 31.3 g/dL (ref 30.0–36.0)
MCV: 103.2 fL — ABNORMAL HIGH (ref 80.0–100.0)
Monocytes Absolute: 0.7 10*3/uL (ref 0.1–1.0)
Monocytes Relative: 9 %
Neutro Abs: 5.6 10*3/uL (ref 1.7–7.7)
Neutrophils Relative %: 70 %
Platelet Count: 495 10*3/uL — ABNORMAL HIGH (ref 150–400)
RBC: 3.44 MIL/uL — ABNORMAL LOW (ref 3.87–5.11)
RDW: 13.8 % (ref 11.5–15.5)
WBC Count: 8.1 10*3/uL (ref 4.0–10.5)
nRBC: 0 % (ref 0.0–0.2)

## 2023-06-07 LAB — CMP (CANCER CENTER ONLY)
ALT: <5 U/L (ref 0–44)
AST: 7 U/L — ABNORMAL LOW (ref 15–41)
Albumin: 4.4 g/dL (ref 3.5–5.0)
Alkaline Phosphatase: 46 U/L (ref 38–126)
Anion gap: 3 — ABNORMAL LOW (ref 5–15)
BUN: 27 mg/dL — ABNORMAL HIGH (ref 8–23)
CO2: 32 mmol/L (ref 22–32)
Calcium: 9.1 mg/dL (ref 8.9–10.3)
Chloride: 104 mmol/L (ref 98–111)
Creatinine: 1 mg/dL (ref 0.44–1.00)
GFR, Estimated: 56 mL/min — ABNORMAL LOW (ref >60)
Glucose, Bld: 96 mg/dL (ref 70–99)
Potassium: 4.5 mmol/L (ref 3.5–5.1)
Sodium: 139 mmol/L (ref 135–145)
Total Bilirubin: 0.4 mg/dL (ref 0.0–1.2)
Total Protein: 6.8 g/dL (ref 6.5–8.1)

## 2023-06-07 MED ORDER — HYDROXYUREA 500 MG PO CAPS: 500.0000 mg | ORAL_CAPSULE | ORAL | 3 refills | Status: DC

## 2023-06-07 NOTE — Progress Notes (Signed)
 Patient Care Team: Orpha Bur, MD as PCP - General (Family Medicine) Tat, Octaviano Batty, DO as Consulting Physician (Neurology)  DIAGNOSIS:  Encounter Diagnosis  Name Primary?   Essential thrombocytosis (HCC) Yes     CHIEF COMPLIANT: Follow-up of essential thrombocytosis on Hydrea  HISTORY OF PRESENT ILLNESS:   History of Present Illness Deanna Hill, a patient with a history of Parkinson's disease and a recent fall, presents with worsening back pain. She reports that she has been hunching over for years, which has been progressively worsening. She was not previously aware that this could be indicative of scoliosis. Beverely also mentions that her Parkinson's symptoms have been particularly bothersome recently, with increased shakiness. She has an upcoming appointment with a gastroenterologist due to stomach issues, but the specifics of these symptoms are not discussed in detail.  Riyan also reports a significant weight loss of about 30 pounds over an unspecified period of time. Despite this weight loss, she insists that she continues to eat regularly, including sweets and cookies. She also mentions that she tries to do a little bit of exercise every day, although the extent and intensity of these exercises are not specified.  In addition to these primary complaints, Latrell mentions a history of falls, including a significant fall last May where she was found unconscious and required hospitalization and blood transfusion. She also mentions a mini-stroke prior to this fall, which was associated with a severe headache.     ALLERGIES:  is allergic to iodine, prednisone, shellfish allergy, covid-19 mrna vacc (moderna), gabapentin, meloxicam, other, topiramate, and zoloft [sertraline].  MEDICATIONS:  Current Outpatient Medications  Medication Sig Dispense Refill   acetaminophen (TYLENOL) 325 MG tablet Take 2 tablets (650 mg total) by mouth every 6 (six) hours as needed for mild pain or  moderate pain (or Fever >/= 101).     amLODipine (NORVASC) 10 MG tablet Take 1 tablet (10 mg total) by mouth daily.     BIOTIN PO Take 1 capsule by mouth daily.     carbidopa-levodopa (SINEMET IR) 25-100 MG tablet TAKE 2 TABLETS BY MOUTH AT 7 AM, 2 TABLETS AT 11 AM, 2 TABLETS AT 3 PM 540 tablet 0   Cholecalciferol (VITAMIN D) 2000 UNITS CAPS Take 1 capsule by mouth daily.     clopidogrel (PLAVIX) 75 MG tablet Take 1 tablet (75 mg total) by mouth daily. 90 tablet 1   DULoxetine (CYMBALTA) 20 MG capsule Take 20 mg by mouth daily.     hydroxyurea (HYDREA) 500 MG capsule Take 1 capsule (500 mg total) by mouth See admin instructions. Take 500 mg (1 capsule) by mouth three times a week. 12 capsule 3   levothyroxine (SYNTHROID, LEVOTHROID) 88 MCG tablet Take 88 mcg by mouth daily.     metoprolol tartrate (LOPRESSOR) 25 MG tablet Take 1 tablet (25 mg total) by mouth 2 (two) times daily.     nitroGLYCERIN (NITROSTAT) 0.4 MG SL tablet Place 1 tablet (0.4 mg total) under the tongue every 5 (five) minutes as needed for chest pain. 25 tablet 6   pantoprazole (PROTONIX) 40 MG tablet Take 1 tablet (40 mg total) by mouth 2 (two) times daily.     Probiotic Product (PROBIOTIC PO) Take 1 tablet by mouth daily.     rosuvastatin (CRESTOR) 10 MG tablet Take 1 tablet (10 mg total) by mouth daily. (Patient not taking: Reported on 01/28/2023) 90 tablet 1   No current facility-administered medications for this visit.    PHYSICAL EXAMINATION: ECOG  PERFORMANCE STATUS: 1 - Symptomatic but completely ambulatory  Vitals:   06/07/23 1450  BP: (!) 142/75  Pulse: 81  Resp: 18  Temp: 98.2 F (36.8 C)  SpO2: 99%   Filed Weights   06/07/23 1450  Weight: 119 lb 4.8 oz (54.1 kg)      LABORATORY DATA:  I have reviewed the data as listed    Latest Ref Rng & Units 06/07/2023    2:31 PM 08/16/2022    5:55 AM 08/15/2022    5:37 AM  CMP  Glucose 70 - 99 mg/dL 96  161  096   BUN 8 - 23 mg/dL 27  41  57   Creatinine 0.44  - 1.00 mg/dL 0.45  4.09  8.11   Sodium 135 - 145 mmol/L 139  143  142   Potassium 3.5 - 5.1 mmol/L 4.5  3.7  3.9   Chloride 98 - 111 mmol/L 104  110  111   CO2 22 - 32 mmol/L 32  24  25   Calcium 8.9 - 10.3 mg/dL 9.1  8.0  7.9   Total Protein 6.5 - 8.1 g/dL 6.8     Total Bilirubin 0.0 - 1.2 mg/dL 0.4     Alkaline Phos 38 - 126 U/L 46     AST 15 - 41 U/L 7     ALT 0 - 44 U/L <5       Lab Results  Component Value Date   WBC 8.1 06/07/2023   HGB 11.1 (L) 06/07/2023   HCT 35.5 (L) 06/07/2023   MCV 103.2 (H) 06/07/2023   PLT 495 (H) 06/07/2023   NEUTROABS 5.6 06/07/2023    ASSESSMENT & PLAN:  Essential thrombocytosis (HCC) Essential thrombocytosis with JAK2 mutation: Continue Hydrea 500 mg daily and also aspirin 325 mg by mouth once daily.  Current dose: Hydroxyurea 500 mg Monday Wednesday Friday changed to twice a week from 03/08/2019   Hospitalization 04/17/2016 to 04/22/2016: Possible PRES Posterior reversible encephalopathy syndrome (due to seizure) VS Complicated Migraine   Hydrea toxicities: 1. Fatigue   2. Hair loss   3. brittle nails    Hospitalization for posterior reversible encephalopathy syndrome Hospitalization 08/13/2022-08/18/2022: Acute CVA, upper GI bleed and anemia from blood loss   Lab review:  09/05/2018: White count 8.5, hemoglobin 12.4, platelets 469, previously they were 465. 09/06/2019: WBC 9.3, MCV 101.3, platelets 541 09/05/20: WBC 8.4, hemoglobin 12.1, platelets 528 10/03/2021: WBC 10.6, hemoglobin 12.4, platelets 786 08/17/2022: WBC 17.7, hemoglobin 8, platelets 490 (during hospitalization for CVA and GI bleed) 02/02/2023: WBC 8.5, hemoglobin 11.2, platelets 525 06/07/2023:  Continue with hydroxyurea 3 times a week.   Follow-up in 6 months with labs  Unintentional weight loss: 30 pounds ------------------------------------- Assessment and Plan Assessment & Plan Essential thrombocytosis Platelet count controlled with hydroxyurea. Hemoglobin at 11.1,  no impact on WBC count. Treatment effective without significant adverse effects. - Continue hydroxyurea 500 mg orally three times a week. - Monitor blood counts regularly.  Anemia Hemoglobin level at 11.1, no acute anemia. - Monitor hemoglobin levels regularly.  Unintentional weight loss 30-pound weight loss despite usual diet. Likely multifactorial, related to muscle mass loss and other conditions. - Encourage daily physical activity and exercises to maintain muscle mass.  Parkinson's disease Significant tremors affecting daily life. - Continue current Parkinson's disease management. - Consider discussing medication adjustments with a neurologist if symptoms worsen.  Scoliosis Significant back pain possibly related to scoliosis. No specialist consultation since leaving nursing home. - Consider referral  to an orthopedic specialist for scoliosis evaluation and management.  Gastrointestinal issues Gastrointestinal discomfort reported. - Attend scheduled appointment with gastroenterologist in May.  Goals of Care Desires DNR order, open to blood transfusions and supportive care for comfort and quality of life. - Ensure DNR order is documented and respected. - Provide supportive care as needed.  Follow-up Well-managed on current treatment regimen. - Schedule follow-up appointment in six months.      Orders Placed This Encounter  Procedures   CBC with Differential (Cancer Center Only)    Standing Status:   Future    Expiration Date:   06/06/2024   The patient has a good understanding of the overall plan. she agrees with it. she will call with any problems that may develop before the next visit here. Total time spent: 30 mins including face to face time and time spent for planning, charting and co-ordination of care   Tamsen Meek, MD 06/07/23

## 2023-06-07 NOTE — Assessment & Plan Note (Signed)
 Essential thrombocytosis with JAK2 mutation: Continue Hydrea 500 mg daily and also aspirin 325 mg by mouth once daily.  Current dose: Hydroxyurea 500 mg Monday Wednesday Friday changed to twice a week from 03/08/2019   Hospitalization 04/17/2016 to 04/22/2016: Possible PRES Posterior reversible encephalopathy syndrome (due to seizure) VS Complicated Migraine   Hydrea toxicities: 1. Fatigue   2. Hair loss   3. brittle nails    Hospitalization for posterior reversible encephalopathy syndrome Hospitalization 08/13/2022-08/18/2022: Acute CVA, upper GI bleed and anemia from blood loss   Lab review:  09/05/2018: White count 8.5, hemoglobin 12.4, platelets 469, previously they were 465. 09/06/2019: WBC 9.3, MCV 101.3, platelets 541 09/05/20: WBC 8.4, hemoglobin 12.1, platelets 528 10/03/2021: WBC 10.6, hemoglobin 12.4, platelets 786 08/17/2022: WBC 17.7, hemoglobin 8, platelets 490 (during hospitalization for CVA and GI bleed) 02/02/2023: WBC 8.5, hemoglobin 11.2, platelets 525 06/07/2023:  Continue with hydroxyurea 3 times a week.   Follow-up in 4 months with labs  Unintentional weight loss: 30 pounds

## 2023-06-28 ENCOUNTER — Other Ambulatory Visit: Payer: Self-pay | Admitting: Gastroenterology

## 2023-06-28 DIAGNOSIS — R634 Abnormal weight loss: Secondary | ICD-10-CM | POA: Diagnosis not present

## 2023-06-28 DIAGNOSIS — R1011 Right upper quadrant pain: Secondary | ICD-10-CM | POA: Diagnosis not present

## 2023-06-28 DIAGNOSIS — R110 Nausea: Secondary | ICD-10-CM | POA: Diagnosis not present

## 2023-06-28 DIAGNOSIS — R109 Unspecified abdominal pain: Secondary | ICD-10-CM

## 2023-07-16 ENCOUNTER — Ambulatory Visit
Admission: RE | Admit: 2023-07-16 | Discharge: 2023-07-16 | Disposition: A | Discharge status: Home / Self Care | Source: Ambulatory Visit | Attending: Gastroenterology | Admitting: Gastroenterology

## 2023-07-16 DIAGNOSIS — R109 Unspecified abdominal pain: Secondary | ICD-10-CM

## 2023-07-16 DIAGNOSIS — R1031 Right lower quadrant pain: Secondary | ICD-10-CM | POA: Diagnosis not present

## 2023-07-21 DIAGNOSIS — D500 Iron deficiency anemia secondary to blood loss (chronic): Secondary | ICD-10-CM | POA: Diagnosis not present

## 2023-07-21 DIAGNOSIS — E039 Hypothyroidism, unspecified: Secondary | ICD-10-CM | POA: Diagnosis not present

## 2023-07-21 DIAGNOSIS — I10 Essential (primary) hypertension: Secondary | ICD-10-CM | POA: Diagnosis not present

## 2023-07-21 DIAGNOSIS — G20A1 Parkinson's disease without dyskinesia, without mention of fluctuations: Secondary | ICD-10-CM | POA: Diagnosis not present

## 2023-07-21 DIAGNOSIS — M545 Low back pain, unspecified: Secondary | ICD-10-CM | POA: Diagnosis not present

## 2023-07-21 DIAGNOSIS — N1831 Chronic kidney disease, stage 3a: Secondary | ICD-10-CM | POA: Diagnosis not present

## 2023-07-21 LAB — TSH: TSH: 0.29 — AB (ref 0.41–5.90)

## 2023-07-27 NOTE — Progress Notes (Unsigned)
 Assessment/Plan:   1.  Parkinsons Disease  -Discussed concept of levodopa  resistant tremor.   -*** carbidopa /levodopa  25/100, 2 at 7am, 2 at 11am,  and move the last to 3pm to see if it helps the anxiety that she feels at 3-3:30 PM (currently dosing last at 4:00).  Discussed this last visit as well.  We will also have her take 2 tablets at 3pm, which is an increase from the 1 tab previously -discussed aqua therapy.  She declines for now.  Can let me know if she wants referral in future  2.  GAD/depression  -She stopped the escitalopram by primary care as she felt it created side effect.  Still think that this is an issue.  Pcp just started cymbalta 20 mg and while reluctant, she may be agreeable to increasing with pcp  3.  History of chronic vertigo/dizziness/nausea  -follows with [facility]  -chronic  -preceeded the addition of carbidopa /levodopa    4.  History of PRES  -Treated by Dr. Janett Medin in 2018  5.  Essential thrombocytosis  -on hydroxyurea   6.  Insomnia  -doesn't want further meds  -recommended trial melatonin, 3 mg nightly 7.  Parkinsons Disease dyskinesia  -not bothersome to patient and we will hold further medication for that  8.  Corpus callosum infarct, May, 2024  -On Plavix , 75 mg  -LDL 94 at the time of the event. Given crestor  in the hospital but pt d/c and didn't want to take   9.  Parafalcine meningioma  -Incidental finding, 11 mm  10.  History of GI bleed  -Was in the context of dual antiplatelet therapy, on aspirin  and Plavix , in immediate weeks after infarct  11.   LBP, chronic  -worse after fall  -f/u spine and scoliosis specialists, where she previously had injections  12.  Weight loss  - Is following with oncology.  Weight, however, has been fairly stable since last November.  -discussed protein supplements  Subjective:   Deanna Hill was seen today in follow up for Parkinsons disease, diagnosed last visit.  My previous records were  reviewed prior to todays visit as well as outside records available to me. Pt with daughter who supplements the history.   Patient has done fairly well since our last visit.  She has had 1 fall.  No lightheadedness or near syncope.  Last visit, we had her slightly increase her 3 PM dose to see if that would help with the afternoon anxiety.  She reports today that ***.  Current prescribed movement disorder medications: carbidopa /levodopa  25/100 2 at 7am, 2 at 11am, 2 at 3pm (slight increase)  Current/Previously tried tremor medications: Topamax  25 mg (was on it for headache post PRES but thought it helped tremor); clonazepam  (hangover effect); propranolol ; Zonegran  is on her list of not tolerated medications, but records indicate that she was given it, but she never tried it because she was scared of potential side effects; primidone , tried at very low dose for just a few weeks and reported possible weakness; lexapro 5 mg (she states that she didn't feel well)    ALLERGIES:   Allergies  Allergen Reactions   Iodine Shortness Of Breath and Other (See Comments)    Pt states "Deadly"   Prednisone     Facial flushing and swelling   Shellfish Allergy Anaphylaxis and Other (See Comments)    " I almost died once"    Covid-19 Mrna Vacc (Moderna) Other (See Comments)    unknown  Gabapentin  Other (See Comments)    unknown   Meloxicam Other (See Comments)    unknown   Other Other (See Comments)    unknown   Topiramate  Other (See Comments)    unknown   Zoloft [Sertraline] Other (See Comments)    unknown    CURRENT MEDICATIONS:  No outpatient medications have been marked as taking for the 07/29/23 encounter (Appointment) with Dennie Vecchio, Von Grumbling, DO.     Objective:   PHYSICAL EXAMINATION:    VITALS:   There were no vitals filed for this visit.  Wt Readings from Last 3 Encounters:  06/07/23 119 lb 4.8 oz (54.1 kg)  02/02/23 120 lb 11.2 oz (54.7 kg)  01/28/23 120 lb (54.4 kg)    GEN:  The  patient appears stated age and is in NAD. HEENT:  Normocephalic, atraumatic.  The mucous membranes are moist. The superficial temporal arteries are without ropiness or tenderness. Cv:  rrr Lungs:  ctab  Neurological examination:  Orientation: The patient is alert and oriented x3. Cranial nerves: There is good facial symmetry with slight facial hypomimia. The speech is fluent and clear. Soft palate rises symmetrically and there is no tongue deviation. Hearing is intact to conversational tone. Sensation: Sensation is intact to light touch throughout Motor: Strength is at least antigravity x4.  Movement examination: Tone: Normal tone in the upper and lower extremities today. Abnormal movements: there is no tremor or dyskinesia today Coordination:  There is no decremation, with any form of RAMS, including alternating supination and pronation of the forearm, hand opening and closing, finger taps, heel taps and toe taps. Gait and Station: The patient pushes off the chair to arise.  She ambulates pretty well with her walker.  Shown [place] as well  I have reviewed and interpreted the following labs independently    Chemistry      Component Value Date/Time   NA 139 06/07/2023 1431   NA 140 01/18/2017 1029   K 4.5 06/07/2023 1431   K 4.1 01/18/2017 1029   CL 104 06/07/2023 1431   CL 107 06/24/2012 0859   CO2 32 06/07/2023 1431   CO2 25 01/18/2017 1029   BUN 27 (H) 06/07/2023 1431   BUN 21.7 01/18/2017 1029   CREATININE 1.00 06/07/2023 1431   CREATININE 1.0 01/18/2017 1029      Component Value Date/Time   CALCIUM  9.1 06/07/2023 1431   CALCIUM  9.3 01/18/2017 1029   ALKPHOS 46 06/07/2023 1431   ALKPHOS 54 01/18/2017 1029   AST 7 (L) 06/07/2023 1431   AST 14 01/18/2017 1029   ALT <5 06/07/2023 1431   ALT 11 01/18/2017 1029   BILITOT 0.4 06/07/2023 1431   BILITOT 0.31 01/18/2017 1029       Lab Results  Component Value Date   WBC 8.1 06/07/2023   HGB 11.1 (L) 06/07/2023   HCT  35.5 (L) 06/07/2023   MCV 103.2 (H) 06/07/2023   PLT 495 (H) 06/07/2023    Lab Results  Component Value Date   TSH 0.487 08/14/2022   Total time spent on today's visit was *** minutes, including both face-to-face time and nonface-to-face time.  Time included that spent on review of records (prior notes available to me/labs/imaging if pertinent), discussing treatment and goals, answering patient's questions and coordinating care.    Cc:  Madden, Evan J, MD

## 2023-07-29 ENCOUNTER — Ambulatory Visit: Payer: Medicare Other | Admitting: Neurology

## 2023-07-29 VITALS — BP 118/72 | HR 81 | Wt 120.6 lb

## 2023-07-29 DIAGNOSIS — K117 Disturbances of salivary secretion: Secondary | ICD-10-CM | POA: Diagnosis not present

## 2023-07-29 DIAGNOSIS — M545 Low back pain, unspecified: Secondary | ICD-10-CM | POA: Diagnosis not present

## 2023-07-29 DIAGNOSIS — G8929 Other chronic pain: Secondary | ICD-10-CM

## 2023-07-29 DIAGNOSIS — G20B2 Parkinson's disease with dyskinesia, with fluctuations: Secondary | ICD-10-CM

## 2023-07-29 MED ORDER — AMANTADINE HCL 100 MG PO CAPS: 100.0000 mg | ORAL_CAPSULE | Freq: Two times a day (BID) | ORAL | 1 refills | Status: DC

## 2023-07-29 NOTE — Patient Instructions (Addendum)
 Start amantadine 100mg , 1 at 11am x 2 weeks.  If you are happy with this, stay at this dosage.  If not and have no side effects, then you can increase this to 1 at 11am and 1 at 3pm with your levodopa   Don't start the above with the other new medications that you may be starting (like the ultram ).  Give it a few weeks between starting the new medications.  DIETARY GUIDELINES Research suggests that Parkinson's disease symptoms might be improved by increasing the levels of anti-oxidant and anti-inflammatory compounds in your brain. This means eating more whole, unprocessed plant foods. The more variety and color in your diet the better! Don't spend a lot of money on supplements, spend it on fresh healthy food; food is medicine! Eat what you need to eat to be happy, but eat it as a 'treat', not as a staple food, and eat a lot more of the food that is good for your health. Every healthy lifestyle change helps and most people do better making gradual minor changes that become major over time.  1) Eat at least 7-9 servings of fruits and vegetables a day, including:  Eat lots more berries (blueberries, cherries, raspberries, goji berries, cranberries) and fruits (apples, pears, oranges, plums, prunes, apricots, peaches, nectarines, watermelon, grapefruit, pineapple, bananas) fresh, frozen or dried, all are healthy options.   Eat lots more dark leafy greens (spinach, romaine lettuce, arugula, Swiss chard, turnip greens, radicchio), cruciferous vegetables (broccoli, cauliflower, cabbage, red cabbage, collard greens, kale, brussel sprouts) and allium vegetables (onion, shallot, garlic, spring onion). Cut these vegetables up in small pieces and let them sit for a few minutes before steaming or microwaving.   Eat lots more colorful vegetables like beets, asparagus, red pepper, tomatoes, carrots, sweet potato, squash, pumpkin and other vegetables that are bright green, red, yellow and don't always eat the same ones. If  you are cooking them, steam them or microwave them.  2) On your salads, make your own dressing and include a source of healthy fat (almonds, flax seeds, walnuts, cashews, tahini, sunflower seeds, extra virgin olive oil) to help absorb the anti-oxidants.  3) Drink more liquid including lots of tea (white tea, green tea, rooibos tea, hibiscus tea, rosehip tea) and less dairy milk, soda, and clear fruit juice. Coffee, beer and wine are good too, in moderation. 4) Eat LOTS more spices (turmeric, pepper, cloves, nutmeg, oregano, basil, [place] cinnamon, thyme, rosemary, ginger). Include these in your daily oatmeal, salads, roasted vegetables, etc. Try traditional blends of spices from different cultures. 5) Eat at least 1-2 tablespoons ground flax seeds daily. Ideally, buy them whole and grind them yourself as needed. Keep them and most foods with high fat refrigerated. 6) Eat more mushrooms, cooked. Any kind of mushroom is fine. 7) Don't eat lots of fish and if you do, eat [place] wild caught salmon or small fatty fishes like mackerel and sardines. If you want to take supplemental omega-3 fatty acids, instead of fish oil, consider algae-derived omega-3 supplements which cost more, but may be better for you because they don't have the pollutants that are in most fish oil. 8) Eat lots more legumes which include kidney beans, black beans, garbanzo beans, black-eyed peas, fava beans, navy beans, white beans, broad beans, peanuts, green lentils, red lentils, tofu, miso, edamame, soybeans, tempeh, etc. 9) Eat lots more whole grains (oatmeal, brown rice, quinoa, buckwheat, barley, spelt, faro, bulgur wheat, whole wheat or multigrain bread, whole wheat pasta, soba or buckwheat noodles  etc.) and less processed grains and grain products (white rice, white bread, enriched flour, fortified flour, cakes and pastries, products that say 'made with whole grains').  10) Try to eat enough whole plant foods, with enough dietary  fiber to have at least one bowel movement a day, without stool softeners or laxatives.  11) If you become a vegan, (which means giving up meat, poultry, fish AND dairy products) consult your physician and take B 12 supplements (2500mcg once/week).    Supplements in Parkinson Disease Don't spend a lot of money on supplements, spend it on fresh healthy food; food is medicine! The dietary guidelines above are safe and effective for people with PD (and may reduce the risk of developing PD for people at higher risk). A brief note on mucuna: Mucuna pruriens is a legume that contains L-dopa. Some health food stores and online retailers sell mucuna extract in powdered or pill form, as an "alternative medicine" -- but it's not really alternative, it's just L-dopa! The amount of L-dopa found in mucuna is not standardized, and the extract may also contain fillers that can be at worst, directly toxic to the brain. I do not recommend taking mucuna instead of levodopa . A brief note on CBD: Cannabidiols (CBD) seems to be helpful for the management of stress and anxiety in Parkinson disease. On the downside, it might worsen apathy and lack of motivation. It does not seem to have an effect on the underlying disease course. Unfortunately, until it's federally legal, we will not be able to study it in research trials and figure out a standardized dosing strategy. If you choose to try CBD oil, try to find a strain that has minimal/no THC -- this will minimize the "high" of marijuana but will still lessen your anxiety

## 2023-08-14 DIAGNOSIS — G20A1 Parkinson's disease without dyskinesia, without mention of fluctuations: Secondary | ICD-10-CM | POA: Diagnosis not present

## 2023-08-14 DIAGNOSIS — M47812 Spondylosis without myelopathy or radiculopathy, cervical region: Secondary | ICD-10-CM | POA: Diagnosis not present

## 2023-08-14 DIAGNOSIS — I10 Essential (primary) hypertension: Secondary | ICD-10-CM | POA: Diagnosis not present

## 2023-08-18 DIAGNOSIS — G903 Multi-system degeneration of the autonomic nervous system: Secondary | ICD-10-CM | POA: Diagnosis not present

## 2023-08-18 DIAGNOSIS — Z681 Body mass index (BMI) 19 or less, adult: Secondary | ICD-10-CM | POA: Diagnosis not present

## 2023-08-24 DIAGNOSIS — G20A1 Parkinson's disease without dyskinesia, without mention of fluctuations: Secondary | ICD-10-CM | POA: Diagnosis not present

## 2023-08-24 DIAGNOSIS — N183 Chronic kidney disease, stage 3 unspecified: Secondary | ICD-10-CM | POA: Diagnosis not present

## 2023-08-24 DIAGNOSIS — Z9181 History of falling: Secondary | ICD-10-CM | POA: Diagnosis not present

## 2023-08-24 DIAGNOSIS — I129 Hypertensive chronic kidney disease with stage 1 through stage 4 chronic kidney disease, or unspecified chronic kidney disease: Secondary | ICD-10-CM | POA: Diagnosis not present

## 2023-08-24 DIAGNOSIS — E039 Hypothyroidism, unspecified: Secondary | ICD-10-CM | POA: Diagnosis not present

## 2023-08-24 DIAGNOSIS — J45909 Unspecified asthma, uncomplicated: Secondary | ICD-10-CM | POA: Diagnosis not present

## 2023-08-24 DIAGNOSIS — G903 Multi-system degeneration of the autonomic nervous system: Secondary | ICD-10-CM | POA: Diagnosis not present

## 2023-08-24 DIAGNOSIS — Z556 Problems related to health literacy: Secondary | ICD-10-CM | POA: Diagnosis not present

## 2023-09-01 DIAGNOSIS — J45909 Unspecified asthma, uncomplicated: Secondary | ICD-10-CM | POA: Diagnosis not present

## 2023-09-01 DIAGNOSIS — G903 Multi-system degeneration of the autonomic nervous system: Secondary | ICD-10-CM | POA: Diagnosis not present

## 2023-09-01 DIAGNOSIS — G20A1 Parkinson's disease without dyskinesia, without mention of fluctuations: Secondary | ICD-10-CM | POA: Diagnosis not present

## 2023-09-01 DIAGNOSIS — N183 Chronic kidney disease, stage 3 unspecified: Secondary | ICD-10-CM | POA: Diagnosis not present

## 2023-09-01 DIAGNOSIS — I129 Hypertensive chronic kidney disease with stage 1 through stage 4 chronic kidney disease, or unspecified chronic kidney disease: Secondary | ICD-10-CM | POA: Diagnosis not present

## 2023-09-08 DIAGNOSIS — J45909 Unspecified asthma, uncomplicated: Secondary | ICD-10-CM | POA: Diagnosis not present

## 2023-09-08 DIAGNOSIS — G20A1 Parkinson's disease without dyskinesia, without mention of fluctuations: Secondary | ICD-10-CM | POA: Diagnosis not present

## 2023-09-08 DIAGNOSIS — N183 Chronic kidney disease, stage 3 unspecified: Secondary | ICD-10-CM | POA: Diagnosis not present

## 2023-09-08 DIAGNOSIS — G903 Multi-system degeneration of the autonomic nervous system: Secondary | ICD-10-CM | POA: Diagnosis not present

## 2023-09-08 DIAGNOSIS — I129 Hypertensive chronic kidney disease with stage 1 through stage 4 chronic kidney disease, or unspecified chronic kidney disease: Secondary | ICD-10-CM | POA: Diagnosis not present

## 2023-09-09 ENCOUNTER — Encounter: Payer: Self-pay | Admitting: Family Medicine

## 2023-09-09 DIAGNOSIS — I129 Hypertensive chronic kidney disease with stage 1 through stage 4 chronic kidney disease, or unspecified chronic kidney disease: Secondary | ICD-10-CM | POA: Diagnosis not present

## 2023-09-09 DIAGNOSIS — J45909 Unspecified asthma, uncomplicated: Secondary | ICD-10-CM | POA: Diagnosis not present

## 2023-09-09 DIAGNOSIS — N183 Chronic kidney disease, stage 3 unspecified: Secondary | ICD-10-CM | POA: Diagnosis not present

## 2023-09-09 DIAGNOSIS — G20A1 Parkinson's disease without dyskinesia, without mention of fluctuations: Secondary | ICD-10-CM | POA: Diagnosis not present

## 2023-09-09 DIAGNOSIS — G903 Multi-system degeneration of the autonomic nervous system: Secondary | ICD-10-CM | POA: Diagnosis not present

## 2023-09-10 DIAGNOSIS — N183 Chronic kidney disease, stage 3 unspecified: Secondary | ICD-10-CM | POA: Diagnosis not present

## 2023-09-10 DIAGNOSIS — G20A1 Parkinson's disease without dyskinesia, without mention of fluctuations: Secondary | ICD-10-CM | POA: Diagnosis not present

## 2023-09-10 DIAGNOSIS — I129 Hypertensive chronic kidney disease with stage 1 through stage 4 chronic kidney disease, or unspecified chronic kidney disease: Secondary | ICD-10-CM | POA: Diagnosis not present

## 2023-09-10 DIAGNOSIS — G903 Multi-system degeneration of the autonomic nervous system: Secondary | ICD-10-CM | POA: Diagnosis not present

## 2023-09-10 DIAGNOSIS — J45909 Unspecified asthma, uncomplicated: Secondary | ICD-10-CM | POA: Diagnosis not present

## 2023-09-14 DIAGNOSIS — G903 Multi-system degeneration of the autonomic nervous system: Secondary | ICD-10-CM | POA: Diagnosis not present

## 2023-09-14 DIAGNOSIS — G20A1 Parkinson's disease without dyskinesia, without mention of fluctuations: Secondary | ICD-10-CM | POA: Diagnosis not present

## 2023-09-14 DIAGNOSIS — N183 Chronic kidney disease, stage 3 unspecified: Secondary | ICD-10-CM | POA: Diagnosis not present

## 2023-09-14 DIAGNOSIS — J45909 Unspecified asthma, uncomplicated: Secondary | ICD-10-CM | POA: Diagnosis not present

## 2023-09-14 DIAGNOSIS — I129 Hypertensive chronic kidney disease with stage 1 through stage 4 chronic kidney disease, or unspecified chronic kidney disease: Secondary | ICD-10-CM | POA: Diagnosis not present

## 2023-09-15 DIAGNOSIS — G20A1 Parkinson's disease without dyskinesia, without mention of fluctuations: Secondary | ICD-10-CM | POA: Diagnosis not present

## 2023-09-15 DIAGNOSIS — I129 Hypertensive chronic kidney disease with stage 1 through stage 4 chronic kidney disease, or unspecified chronic kidney disease: Secondary | ICD-10-CM | POA: Diagnosis not present

## 2023-09-15 DIAGNOSIS — J45909 Unspecified asthma, uncomplicated: Secondary | ICD-10-CM | POA: Diagnosis not present

## 2023-09-15 DIAGNOSIS — G903 Multi-system degeneration of the autonomic nervous system: Secondary | ICD-10-CM | POA: Diagnosis not present

## 2023-09-15 DIAGNOSIS — N183 Chronic kidney disease, stage 3 unspecified: Secondary | ICD-10-CM | POA: Diagnosis not present

## 2023-09-16 DIAGNOSIS — I129 Hypertensive chronic kidney disease with stage 1 through stage 4 chronic kidney disease, or unspecified chronic kidney disease: Secondary | ICD-10-CM | POA: Diagnosis not present

## 2023-09-16 DIAGNOSIS — G903 Multi-system degeneration of the autonomic nervous system: Secondary | ICD-10-CM | POA: Diagnosis not present

## 2023-09-16 DIAGNOSIS — N183 Chronic kidney disease, stage 3 unspecified: Secondary | ICD-10-CM | POA: Diagnosis not present

## 2023-09-16 DIAGNOSIS — G20A1 Parkinson's disease without dyskinesia, without mention of fluctuations: Secondary | ICD-10-CM | POA: Diagnosis not present

## 2023-09-16 DIAGNOSIS — J45909 Unspecified asthma, uncomplicated: Secondary | ICD-10-CM | POA: Diagnosis not present

## 2023-09-20 DIAGNOSIS — I129 Hypertensive chronic kidney disease with stage 1 through stage 4 chronic kidney disease, or unspecified chronic kidney disease: Secondary | ICD-10-CM | POA: Diagnosis not present

## 2023-09-20 DIAGNOSIS — J45909 Unspecified asthma, uncomplicated: Secondary | ICD-10-CM | POA: Diagnosis not present

## 2023-09-20 DIAGNOSIS — N183 Chronic kidney disease, stage 3 unspecified: Secondary | ICD-10-CM | POA: Diagnosis not present

## 2023-09-20 DIAGNOSIS — G20A1 Parkinson's disease without dyskinesia, without mention of fluctuations: Secondary | ICD-10-CM | POA: Diagnosis not present

## 2023-09-20 DIAGNOSIS — G903 Multi-system degeneration of the autonomic nervous system: Secondary | ICD-10-CM | POA: Diagnosis not present

## 2023-09-21 DIAGNOSIS — N183 Chronic kidney disease, stage 3 unspecified: Secondary | ICD-10-CM | POA: Diagnosis not present

## 2023-09-21 DIAGNOSIS — G903 Multi-system degeneration of the autonomic nervous system: Secondary | ICD-10-CM | POA: Diagnosis not present

## 2023-09-21 DIAGNOSIS — G20A1 Parkinson's disease without dyskinesia, without mention of fluctuations: Secondary | ICD-10-CM | POA: Diagnosis not present

## 2023-09-21 DIAGNOSIS — I129 Hypertensive chronic kidney disease with stage 1 through stage 4 chronic kidney disease, or unspecified chronic kidney disease: Secondary | ICD-10-CM | POA: Diagnosis not present

## 2023-09-21 DIAGNOSIS — J45909 Unspecified asthma, uncomplicated: Secondary | ICD-10-CM | POA: Diagnosis not present

## 2023-09-22 DIAGNOSIS — N183 Chronic kidney disease, stage 3 unspecified: Secondary | ICD-10-CM | POA: Diagnosis not present

## 2023-09-22 DIAGNOSIS — I129 Hypertensive chronic kidney disease with stage 1 through stage 4 chronic kidney disease, or unspecified chronic kidney disease: Secondary | ICD-10-CM | POA: Diagnosis not present

## 2023-09-22 DIAGNOSIS — G20A1 Parkinson's disease without dyskinesia, without mention of fluctuations: Secondary | ICD-10-CM | POA: Diagnosis not present

## 2023-09-22 DIAGNOSIS — G903 Multi-system degeneration of the autonomic nervous system: Secondary | ICD-10-CM | POA: Diagnosis not present

## 2023-09-22 DIAGNOSIS — J45909 Unspecified asthma, uncomplicated: Secondary | ICD-10-CM | POA: Diagnosis not present

## 2023-09-23 DIAGNOSIS — I129 Hypertensive chronic kidney disease with stage 1 through stage 4 chronic kidney disease, or unspecified chronic kidney disease: Secondary | ICD-10-CM | POA: Diagnosis not present

## 2023-09-23 DIAGNOSIS — N183 Chronic kidney disease, stage 3 unspecified: Secondary | ICD-10-CM | POA: Diagnosis not present

## 2023-09-23 DIAGNOSIS — J45909 Unspecified asthma, uncomplicated: Secondary | ICD-10-CM | POA: Diagnosis not present

## 2023-09-23 DIAGNOSIS — G20A1 Parkinson's disease without dyskinesia, without mention of fluctuations: Secondary | ICD-10-CM | POA: Diagnosis not present

## 2023-09-23 DIAGNOSIS — G903 Multi-system degeneration of the autonomic nervous system: Secondary | ICD-10-CM | POA: Diagnosis not present

## 2023-09-26 DIAGNOSIS — N183 Chronic kidney disease, stage 3 unspecified: Secondary | ICD-10-CM | POA: Diagnosis not present

## 2023-09-26 DIAGNOSIS — G20A1 Parkinson's disease without dyskinesia, without mention of fluctuations: Secondary | ICD-10-CM | POA: Diagnosis not present

## 2023-09-26 DIAGNOSIS — I129 Hypertensive chronic kidney disease with stage 1 through stage 4 chronic kidney disease, or unspecified chronic kidney disease: Secondary | ICD-10-CM | POA: Diagnosis not present

## 2023-09-26 DIAGNOSIS — J45909 Unspecified asthma, uncomplicated: Secondary | ICD-10-CM | POA: Diagnosis not present

## 2023-09-26 DIAGNOSIS — G903 Multi-system degeneration of the autonomic nervous system: Secondary | ICD-10-CM | POA: Diagnosis not present

## 2023-09-28 DIAGNOSIS — I129 Hypertensive chronic kidney disease with stage 1 through stage 4 chronic kidney disease, or unspecified chronic kidney disease: Secondary | ICD-10-CM | POA: Diagnosis not present

## 2023-09-28 DIAGNOSIS — G20A1 Parkinson's disease without dyskinesia, without mention of fluctuations: Secondary | ICD-10-CM | POA: Diagnosis not present

## 2023-09-28 DIAGNOSIS — J45909 Unspecified asthma, uncomplicated: Secondary | ICD-10-CM | POA: Diagnosis not present

## 2023-09-28 DIAGNOSIS — N183 Chronic kidney disease, stage 3 unspecified: Secondary | ICD-10-CM | POA: Diagnosis not present

## 2023-09-28 DIAGNOSIS — G903 Multi-system degeneration of the autonomic nervous system: Secondary | ICD-10-CM | POA: Diagnosis not present

## 2023-09-30 DIAGNOSIS — N183 Chronic kidney disease, stage 3 unspecified: Secondary | ICD-10-CM | POA: Diagnosis not present

## 2023-09-30 DIAGNOSIS — I129 Hypertensive chronic kidney disease with stage 1 through stage 4 chronic kidney disease, or unspecified chronic kidney disease: Secondary | ICD-10-CM | POA: Diagnosis not present

## 2023-09-30 DIAGNOSIS — J45909 Unspecified asthma, uncomplicated: Secondary | ICD-10-CM | POA: Diagnosis not present

## 2023-09-30 DIAGNOSIS — G903 Multi-system degeneration of the autonomic nervous system: Secondary | ICD-10-CM | POA: Diagnosis not present

## 2023-09-30 DIAGNOSIS — G20A1 Parkinson's disease without dyskinesia, without mention of fluctuations: Secondary | ICD-10-CM | POA: Diagnosis not present

## 2023-10-01 DIAGNOSIS — G903 Multi-system degeneration of the autonomic nervous system: Secondary | ICD-10-CM | POA: Diagnosis not present

## 2023-10-01 DIAGNOSIS — G20A1 Parkinson's disease without dyskinesia, without mention of fluctuations: Secondary | ICD-10-CM | POA: Diagnosis not present

## 2023-10-01 DIAGNOSIS — J45909 Unspecified asthma, uncomplicated: Secondary | ICD-10-CM | POA: Diagnosis not present

## 2023-10-01 DIAGNOSIS — I129 Hypertensive chronic kidney disease with stage 1 through stage 4 chronic kidney disease, or unspecified chronic kidney disease: Secondary | ICD-10-CM | POA: Diagnosis not present

## 2023-10-01 DIAGNOSIS — N183 Chronic kidney disease, stage 3 unspecified: Secondary | ICD-10-CM | POA: Diagnosis not present

## 2023-10-05 DIAGNOSIS — G20A1 Parkinson's disease without dyskinesia, without mention of fluctuations: Secondary | ICD-10-CM | POA: Diagnosis not present

## 2023-10-05 DIAGNOSIS — I129 Hypertensive chronic kidney disease with stage 1 through stage 4 chronic kidney disease, or unspecified chronic kidney disease: Secondary | ICD-10-CM | POA: Diagnosis not present

## 2023-10-05 DIAGNOSIS — G903 Multi-system degeneration of the autonomic nervous system: Secondary | ICD-10-CM | POA: Diagnosis not present

## 2023-10-05 DIAGNOSIS — J45909 Unspecified asthma, uncomplicated: Secondary | ICD-10-CM | POA: Diagnosis not present

## 2023-10-05 DIAGNOSIS — N183 Chronic kidney disease, stage 3 unspecified: Secondary | ICD-10-CM | POA: Diagnosis not present

## 2023-10-06 ENCOUNTER — Other Ambulatory Visit: Payer: Self-pay | Admitting: Neurology

## 2023-10-06 DIAGNOSIS — G20B2 Parkinson's disease with dyskinesia, with fluctuations: Secondary | ICD-10-CM

## 2023-10-06 DIAGNOSIS — G20A1 Parkinson's disease without dyskinesia, without mention of fluctuations: Secondary | ICD-10-CM | POA: Diagnosis not present

## 2023-10-06 DIAGNOSIS — I129 Hypertensive chronic kidney disease with stage 1 through stage 4 chronic kidney disease, or unspecified chronic kidney disease: Secondary | ICD-10-CM | POA: Diagnosis not present

## 2023-10-06 DIAGNOSIS — G903 Multi-system degeneration of the autonomic nervous system: Secondary | ICD-10-CM | POA: Diagnosis not present

## 2023-10-06 DIAGNOSIS — J45909 Unspecified asthma, uncomplicated: Secondary | ICD-10-CM | POA: Diagnosis not present

## 2023-10-06 DIAGNOSIS — N183 Chronic kidney disease, stage 3 unspecified: Secondary | ICD-10-CM | POA: Diagnosis not present

## 2023-10-07 DIAGNOSIS — G903 Multi-system degeneration of the autonomic nervous system: Secondary | ICD-10-CM | POA: Diagnosis not present

## 2023-10-07 DIAGNOSIS — J45909 Unspecified asthma, uncomplicated: Secondary | ICD-10-CM | POA: Diagnosis not present

## 2023-10-07 DIAGNOSIS — N183 Chronic kidney disease, stage 3 unspecified: Secondary | ICD-10-CM | POA: Diagnosis not present

## 2023-10-07 DIAGNOSIS — G20A1 Parkinson's disease without dyskinesia, without mention of fluctuations: Secondary | ICD-10-CM | POA: Diagnosis not present

## 2023-10-07 DIAGNOSIS — I129 Hypertensive chronic kidney disease with stage 1 through stage 4 chronic kidney disease, or unspecified chronic kidney disease: Secondary | ICD-10-CM | POA: Diagnosis not present

## 2023-10-10 ENCOUNTER — Encounter: Payer: Self-pay | Admitting: Hematology and Oncology

## 2023-10-10 ENCOUNTER — Emergency Department (HOSPITAL_COMMUNITY)

## 2023-10-10 ENCOUNTER — Other Ambulatory Visit: Payer: Self-pay

## 2023-10-10 ENCOUNTER — Emergency Department (HOSPITAL_COMMUNITY)
Admission: EM | Admit: 2023-10-10 | Discharge: 2023-10-10 | Disposition: A | Discharge status: Home / Self Care | Attending: Emergency Medicine | Admitting: Emergency Medicine

## 2023-10-10 ENCOUNTER — Encounter (HOSPITAL_COMMUNITY): Payer: Self-pay

## 2023-10-10 DIAGNOSIS — D7589 Other specified diseases of blood and blood-forming organs: Secondary | ICD-10-CM | POA: Insufficient documentation

## 2023-10-10 DIAGNOSIS — M549 Dorsalgia, unspecified: Secondary | ICD-10-CM | POA: Diagnosis not present

## 2023-10-10 DIAGNOSIS — I2510 Atherosclerotic heart disease of native coronary artery without angina pectoris: Secondary | ICD-10-CM | POA: Diagnosis not present

## 2023-10-10 DIAGNOSIS — R3915 Urgency of urination: Secondary | ICD-10-CM | POA: Insufficient documentation

## 2023-10-10 DIAGNOSIS — M25511 Pain in right shoulder: Secondary | ICD-10-CM | POA: Insufficient documentation

## 2023-10-10 DIAGNOSIS — M25521 Pain in right elbow: Secondary | ICD-10-CM | POA: Diagnosis not present

## 2023-10-10 DIAGNOSIS — W19XXXA Unspecified fall, initial encounter: Secondary | ICD-10-CM | POA: Diagnosis not present

## 2023-10-10 DIAGNOSIS — M542 Cervicalgia: Secondary | ICD-10-CM | POA: Diagnosis not present

## 2023-10-10 DIAGNOSIS — Z23 Encounter for immunization: Secondary | ICD-10-CM | POA: Diagnosis not present

## 2023-10-10 DIAGNOSIS — S59901A Unspecified injury of right elbow, initial encounter: Secondary | ICD-10-CM | POA: Diagnosis present

## 2023-10-10 DIAGNOSIS — S0990XA Unspecified injury of head, initial encounter: Secondary | ICD-10-CM | POA: Insufficient documentation

## 2023-10-10 DIAGNOSIS — R799 Abnormal finding of blood chemistry, unspecified: Secondary | ICD-10-CM | POA: Diagnosis not present

## 2023-10-10 DIAGNOSIS — R220 Localized swelling, mass and lump, head: Secondary | ICD-10-CM | POA: Diagnosis not present

## 2023-10-10 DIAGNOSIS — S22081A Stable burst fracture of T11-T12 vertebra, initial encounter for closed fracture: Secondary | ICD-10-CM | POA: Diagnosis not present

## 2023-10-10 DIAGNOSIS — R7309 Other abnormal glucose: Secondary | ICD-10-CM | POA: Diagnosis not present

## 2023-10-10 DIAGNOSIS — G20A1 Parkinson's disease without dyskinesia, without mention of fluctuations: Secondary | ICD-10-CM | POA: Insufficient documentation

## 2023-10-10 DIAGNOSIS — M19011 Primary osteoarthritis, right shoulder: Secondary | ICD-10-CM | POA: Diagnosis not present

## 2023-10-10 DIAGNOSIS — S51011A Laceration without foreign body of right elbow, initial encounter: Secondary | ICD-10-CM | POA: Insufficient documentation

## 2023-10-10 DIAGNOSIS — R404 Transient alteration of awareness: Secondary | ICD-10-CM | POA: Diagnosis not present

## 2023-10-10 DIAGNOSIS — R270 Ataxia, unspecified: Secondary | ICD-10-CM | POA: Diagnosis not present

## 2023-10-10 DIAGNOSIS — R739 Hyperglycemia, unspecified: Secondary | ICD-10-CM

## 2023-10-10 LAB — CBC WITH DIFFERENTIAL/PLATELET
Abs Immature Granulocytes: 0.16 K/uL — ABNORMAL HIGH (ref 0.00–0.07)
Basophils Absolute: 0.1 K/uL (ref 0.0–0.1)
Basophils Relative: 1 %
Eosinophils Absolute: 0 K/uL (ref 0.0–0.5)
Eosinophils Relative: 0 %
HCT: 40.9 % (ref 36.0–46.0)
Hemoglobin: 13 g/dL (ref 12.0–15.0)
Immature Granulocytes: 1 %
Lymphocytes Relative: 1 %
Lymphs Abs: 0.3 K/uL — ABNORMAL LOW (ref 0.7–4.0)
MCH: 32.1 pg (ref 26.0–34.0)
MCHC: 31.8 g/dL (ref 30.0–36.0)
MCV: 101 fL — ABNORMAL HIGH (ref 80.0–100.0)
Monocytes Absolute: 1.1 K/uL — ABNORMAL HIGH (ref 0.1–1.0)
Monocytes Relative: 5 %
Neutro Abs: 20.4 K/uL — ABNORMAL HIGH (ref 1.7–7.7)
Neutrophils Relative %: 92 %
Platelets: 695 K/uL — ABNORMAL HIGH (ref 150–400)
RBC: 4.05 MIL/uL (ref 3.87–5.11)
RDW: 14.4 % (ref 11.5–15.5)
WBC: 22.1 K/uL — ABNORMAL HIGH (ref 4.0–10.5)
nRBC: 0 % (ref 0.0–0.2)

## 2023-10-10 LAB — URINALYSIS, W/ REFLEX TO CULTURE (INFECTION SUSPECTED)
Bilirubin Urine: NEGATIVE
Glucose, UA: 50 mg/dL — AB
Ketones, ur: 5 mg/dL — AB
Nitrite: NEGATIVE
Protein, ur: NEGATIVE mg/dL
Specific Gravity, Urine: 1.012 (ref 1.005–1.030)
pH: 5 (ref 5.0–8.0)

## 2023-10-10 LAB — BASIC METABOLIC PANEL WITH GFR
Anion gap: 10 (ref 5–15)
BUN: 27 mg/dL — ABNORMAL HIGH (ref 8–23)
CO2: 25 mmol/L (ref 22–32)
Calcium: 9.1 mg/dL (ref 8.9–10.3)
Chloride: 102 mmol/L (ref 98–111)
Creatinine, Ser: 0.98 mg/dL (ref 0.44–1.00)
GFR, Estimated: 57 mL/min — ABNORMAL LOW (ref >60)
Glucose, Bld: 153 mg/dL — ABNORMAL HIGH (ref 70–99)
Potassium: 4.5 mmol/L (ref 3.5–5.1)
Sodium: 137 mmol/L (ref 135–145)

## 2023-10-10 LAB — TROPONIN I (HIGH SENSITIVITY): Troponin I (High Sensitivity): 10 ng/L (ref <18)

## 2023-10-10 LAB — CK: Total CK: 71 U/L (ref 38–234)

## 2023-10-10 MED ORDER — TETANUS-DIPHTH-ACELL PERTUSSIS 5-2.5-18.5 LF-MCG/0.5 IM SUSY
0.5000 mL | PREFILLED_SYRINGE | Freq: Once | INTRAMUSCULAR | Status: AC
  Administered 2023-10-10: 0.5 mL via INTRAMUSCULAR
  Filled 2023-10-10: qty 0.5

## 2023-10-10 NOTE — Discharge Instructions (Addendum)
 Please get some kind of alert device that you can wear at all times so that if you have a fall, you can get help immediately.

## 2023-10-10 NOTE — ED Provider Notes (Addendum)
 [place] EMERGENCY DEPARTMENT AT [facility] Provider Note   CSN: [id number] Arrival date & time: 10/10/23  0031     Patient presents with: Deanna Hill is a 83 y.o. female.   The history is provided by the patient and the EMS personnel.   She has history of attention, hyperlipidemia, coronary artery disease, Parkinson's disease, essential thrombocytosis and is brought in as a level 2 trauma following a fall at home-trauma level was based on patient taking clopidogrel .  She apparently has been having episodes of dizziness at home and had loss of consciousness at about 2 PM.  She was found on the floor.  She is complaining of pain in her right shoulder and right elbow and also in her back and neck.  She denies chest pain.  She is reported to have been having problems with urinary urgency for several days without dysuria or fever.    Prior to Admission medications   Medication Sig Start Date End Date Taking? Authorizing Provider  acetaminophen  (TYLENOL ) 325 MG tablet Take 2 tablets (650 mg total) by mouth every 6 (six) hours as needed for mild pain or moderate pain (or Fever >/= 101). 08/18/22   Singh, Prashant K, MD  amantadine  (SYMMETREL ) 100 MG capsule TAKE 1 CAPSULE BY MOUTH TWICE  DAILY WITH THE 11 AM AND 3 PM  DOSE OF LEVODOPA  10/06/23   Tat, Asberry RAMAN, DO  amLODipine  (NORVASC ) 10 MG tablet Take 1 tablet (10 mg total) by mouth daily. 08/18/22   Singh, Prashant K, MD  BIOTIN PO Take 1 capsule by mouth daily.    [provider]  carbidopa -levodopa  (SINEMET  IR) 25-100 MG tablet TAKE 2 TABLETS BY MOUTH AT 7 AM, 2 TABLETS AT 11 AM, 2 TABLETS AT 3 PM 06/02/23   Tat, Asberry RAMAN, DO  Cholecalciferol  (VITAMIN D ) 2000 UNITS CAPS Take 1 capsule by mouth daily.    [provider]  clopidogrel  (PLAVIX ) 75 MG tablet Take 1 tablet (75 mg total) by mouth daily. 08/04/22   Wouk, Devaughn Sayres, MD  hydroxyurea  (HYDREA ) 500 MG capsule Take 1 capsule (500 mg total) by  mouth See admin instructions. Take 500 mg (1 capsule) by mouth three times a week. 06/07/23   Gudena, Vinay, MD  levothyroxine  (SYNTHROID , LEVOTHROID) 88 MCG tablet Take 88 mcg by mouth daily.    [provider]  metoprolol  tartrate (LOPRESSOR ) 25 MG tablet Take 1 tablet (25 mg total) by mouth 2 (two) times daily. 08/18/22   Singh, Prashant K, MD  nitroGLYCERIN  (NITROSTAT ) 0.4 MG SL tablet Place 1 tablet (0.4 mg total) under the tongue every 5 (five) minutes as needed for chest pain. 03/02/19   Anner Alm ORN, MD  pantoprazole  (PROTONIX ) 40 MG tablet Take 1 tablet (40 mg total) by mouth 2 (two) times daily. 08/18/22   Singh, Prashant K, MD  Probiotic Product (PROBIOTIC PO) Take 1 tablet by mouth daily.    [provider]    Allergies: Iodine, Prednisone, Shellfish allergy, Covid-19 mrna vacc (moderna), Gabapentin , Meloxicam, Other, Topiramate , and Zoloft [sertraline]    Review of Systems  All other systems reviewed and are negative.   Updated Vital Signs BP 138/67   Pulse 86   Temp (!) 97.3 F (36.3 C)   Resp 18   Ht 5' 9 (1.753 m)   Wt 54.4 kg   SpO2 100%   BMI 17.72 kg/m   Physical Exam Vitals and nursing note reviewed.   83 year  old female, resting comfortably and in no acute distress. Vital signs are significant for elevated blood pressure. Oxygen saturation is 100%, which is normal. Head is normocephalic and atraumatic. PERRLA, EOMI. Oropharynx is clear. Neck is immobilized in a stiff cervical collar and is diffusely tender.  There is no point tenderness. Back is tender in the mid and lower thoracic spine and upper lumbar spine.  There is no point tenderness. Lungs are clear without rales, wheezes, or rhonchi. Chest is nontender. Heart has regular rate and rhythm without murmur. Abdomen is soft, flat, nontender. Extremities: Skin tear is present in the right elbow.  There is mild pain on range of motion of the right elbow and right shoulder but no swelling or  deformity seen.  Full passive range of motion present in other joints without pain. Skin is warm and dry without rash. Neurologic: Awake and alert, moves all extremities equally.  Significant cogwheel rigidity is noted diffusely.  (all labs ordered are listed, but only abnormal results are displayed) Labs Reviewed  BASIC METABOLIC PANEL WITH GFR - Abnormal; Notable for the following components:      Result Value   Glucose, Bld 153 (*)    BUN 27 (*)    GFR, Estimated 57 (*)    All other components within normal limits  CBC WITH DIFFERENTIAL/PLATELET - Abnormal; Notable for the following components:   WBC 22.1 (*)    MCV 101.0 (*)    Platelets 695 (*)    Neutro Abs 20.4 (*)    Lymphs Abs 0.3 (*)    Monocytes Absolute 1.1 (*)    Abs Immature Granulocytes 0.16 (*)    All other components within normal limits  URINALYSIS, W/ REFLEX TO CULTURE (INFECTION SUSPECTED) - Abnormal; Notable for the following components:   APPearance HAZY (*)    Glucose, UA 50 (*)    Hgb urine dipstick SMALL (*)    Ketones, ur 5 (*)    Leukocytes,Ua MODERATE (*)    Bacteria, UA RARE (*)    All other components within normal limits  URINE CULTURE  CK  TROPONIN I (HIGH SENSITIVITY)    EKG: EKG Interpretation Date/Time:  Sunday October 10 2023 01:12:52 EDT Ventricular Rate:  86 PR Interval:  152 QRS Duration:  82 QT Interval:  380 QTC Calculation: 455 R Axis:   -58  Text Interpretation: Sinus rhythm Left anterior fascicular block Abnormal R-wave progression, early transition When compared with ECG of 08/13/2022 No significant change was found Confirmed by Raford Lenis ([id number]) on 10/10/2023 1:17:06 AM  Radiology: CT Head Wo Contrast Result Date: 10/10/2023 CLINICAL DATA:  Head trauma, minor (Age >= 65y); Ataxia, thoracic trauma; Neck trauma (Age >= 65y); Ataxia, lumbar trauma EXAM: CT HEAD WITHOUT CONTRAST CT CERVICAL SPINE WITHOUT CONTRAST CT THORACIC SPINE WITHOUT CONTRAST CT LUMBAR SPINE WITHOUT CONTRAST  TECHNIQUE: Multidetector CT imaging of the head and cervical, thoracic and lumbar spine was performed following the standard protocol without intravenous contrast. Multiplanar CT image reconstructions of the cervical, thoracic and lumbar spine were also generated. RADIATION DOSE REDUCTION: This exam was performed according to the departmental dose-optimization program which includes automated exposure control, adjustment of the mA and/or kV according to patient size and/or use of iterative reconstruction technique. COMPARISON:  CT abdomen/pelvis Jul 16, 2023. CT head February 05, 2017. FINDINGS: CT HEAD FINDINGS Brain: No evidence of acute infarction, hemorrhage, hydrocephalus, extra-axial collection or mass effect. Approximately 1.2 cm calcified extra-axial mass along the posterior right falx, likely meningioma that is  slightly larger relative to 2018. No mass effect. Vascular: No hyperdense vessel. Skull: No acute fracture. Sinuses/Orbits: Clear sinuses.  No acute orbital findings. Other: No mastoid effusions. CT CERVICAL, THORACIC AND LUMBAR SPINE FINDINGS Alignment: Mild anterolisthesis of C3 on C4, likely degenerative given facet arthropathy at this level. No substantial sagittal subluxation. Skull base and vertebrae: Age indeterminate T3 superior endplate fracture with slight height loss. Chronic T12 burst fracture with similar severe central height loss and bony retropulsion. No evidence of acute fracture in the cervical or lumbar spine. Soft tissues and spinal canal: No prevertebral fluid or swelling. No visible canal hematoma. Disc levels: Mild-to-moderate multilevel degenerative change. Paraspinal: Visualized lungs are clear. IMPRESSION: CT head: 1. No evidence of acute intracranial abnormality. 2. Approximate 1.2 cm probable meningioma along the posterior right falx without mass effect. CT cervical, thoracic and lumbar spine: 1. Age indeterminate T3 superior endplate fracture with slight height loss.  Correlate with the presence or absence of point tenderness. 2. Chronic T12 burst fracture with similar severe central height loss and bony retropulsion. Electronically Signed   By: Gilmore GORMAN Molt M.D.   On: 10/10/2023 01:41   CT Cervical Spine Wo Contrast Result Date: 10/10/2023 CLINICAL DATA:  Head trauma, minor (Age >= 65y); Ataxia, thoracic trauma; Neck trauma (Age >= 65y); Ataxia, lumbar trauma EXAM: CT HEAD WITHOUT CONTRAST CT CERVICAL SPINE WITHOUT CONTRAST CT THORACIC SPINE WITHOUT CONTRAST CT LUMBAR SPINE WITHOUT CONTRAST TECHNIQUE: Multidetector CT imaging of the head and cervical, thoracic and lumbar spine was performed following the standard protocol without intravenous contrast. Multiplanar CT image reconstructions of the cervical, thoracic and lumbar spine were also generated. RADIATION DOSE REDUCTION: This exam was performed according to the departmental dose-optimization program which includes automated exposure control, adjustment of the mA and/or kV according to patient size and/or use of iterative reconstruction technique. COMPARISON:  CT abdomen/pelvis Jul 16, 2023. CT head February 05, 2017. FINDINGS: CT HEAD FINDINGS Brain: No evidence of acute infarction, hemorrhage, hydrocephalus, extra-axial collection or mass effect. Approximately 1.2 cm calcified extra-axial mass along the posterior right falx, likely meningioma that is slightly larger relative to 2018. No mass effect. Vascular: No hyperdense vessel. Skull: No acute fracture. Sinuses/Orbits: Clear sinuses.  No acute orbital findings. Other: No mastoid effusions. CT CERVICAL, THORACIC AND LUMBAR SPINE FINDINGS Alignment: Mild anterolisthesis of C3 on C4, likely degenerative given facet arthropathy at this level. No substantial sagittal subluxation. Skull base and vertebrae: Age indeterminate T3 superior endplate fracture with slight height loss. Chronic T12 burst fracture with similar severe central height loss and bony retropulsion. No  evidence of acute fracture in the cervical or lumbar spine. Soft tissues and spinal canal: No prevertebral fluid or swelling. No visible canal hematoma. Disc levels: Mild-to-moderate multilevel degenerative change. Paraspinal: Visualized lungs are clear. IMPRESSION: CT head: 1. No evidence of acute intracranial abnormality. 2. Approximate 1.2 cm probable meningioma along the posterior right falx without mass effect. CT cervical, thoracic and lumbar spine: 1. Age indeterminate T3 superior endplate fracture with slight height loss. Correlate with the presence or absence of point tenderness. 2. Chronic T12 burst fracture with similar severe central height loss and bony retropulsion. Electronically Signed   By: Gilmore GORMAN Molt M.D.   On: 10/10/2023 01:41   CT Thoracic Spine Wo Contrast Result Date: 10/10/2023 CLINICAL DATA:  Head trauma, minor (Age >= 65y); Ataxia, thoracic trauma; Neck trauma (Age >= 65y); Ataxia, lumbar trauma EXAM: CT HEAD WITHOUT CONTRAST CT CERVICAL SPINE WITHOUT CONTRAST CT THORACIC SPINE WITHOUT  CONTRAST CT LUMBAR SPINE WITHOUT CONTRAST TECHNIQUE: Multidetector CT imaging of the head and cervical, thoracic and lumbar spine was performed following the standard protocol without intravenous contrast. Multiplanar CT image reconstructions of the cervical, thoracic and lumbar spine were also generated. RADIATION DOSE REDUCTION: This exam was performed according to the departmental dose-optimization program which includes automated exposure control, adjustment of the mA and/or kV according to patient size and/or use of iterative reconstruction technique. COMPARISON:  CT abdomen/pelvis Jul 16, 2023. CT head February 05, 2017. FINDINGS: CT HEAD FINDINGS Brain: No evidence of acute infarction, hemorrhage, hydrocephalus, extra-axial collection or mass effect. Approximately 1.2 cm calcified extra-axial mass along the posterior right falx, likely meningioma that is slightly larger relative to 2018. No  mass effect. Vascular: No hyperdense vessel. Skull: No acute fracture. Sinuses/Orbits: Clear sinuses.  No acute orbital findings. Other: No mastoid effusions. CT CERVICAL, THORACIC AND LUMBAR SPINE FINDINGS Alignment: Mild anterolisthesis of C3 on C4, likely degenerative given facet arthropathy at this level. No substantial sagittal subluxation. Skull base and vertebrae: Age indeterminate T3 superior endplate fracture with slight height loss. Chronic T12 burst fracture with similar severe central height loss and bony retropulsion. No evidence of acute fracture in the cervical or lumbar spine. Soft tissues and spinal canal: No prevertebral fluid or swelling. No visible canal hematoma. Disc levels: Mild-to-moderate multilevel degenerative change. Paraspinal: Visualized lungs are clear. IMPRESSION: CT head: 1. No evidence of acute intracranial abnormality. 2. Approximate 1.2 cm probable meningioma along the posterior right falx without mass effect. CT cervical, thoracic and lumbar spine: 1. Age indeterminate T3 superior endplate fracture with slight height loss. Correlate with the presence or absence of point tenderness. 2. Chronic T12 burst fracture with similar severe central height loss and bony retropulsion. Electronically Signed   By: Gilmore GORMAN Molt M.D.   On: 10/10/2023 01:41   CT Lumbar Spine Wo Contrast Result Date: 10/10/2023 CLINICAL DATA:  Head trauma, minor (Age >= 65y); Ataxia, thoracic trauma; Neck trauma (Age >= 65y); Ataxia, lumbar trauma EXAM: CT HEAD WITHOUT CONTRAST CT CERVICAL SPINE WITHOUT CONTRAST CT THORACIC SPINE WITHOUT CONTRAST CT LUMBAR SPINE WITHOUT CONTRAST TECHNIQUE: Multidetector CT imaging of the head and cervical, thoracic and lumbar spine was performed following the standard protocol without intravenous contrast. Multiplanar CT image reconstructions of the cervical, thoracic and lumbar spine were also generated. RADIATION DOSE REDUCTION: This exam was performed according to the  departmental dose-optimization program which includes automated exposure control, adjustment of the mA and/or kV according to patient size and/or use of iterative reconstruction technique. COMPARISON:  CT abdomen/pelvis Jul 16, 2023. CT head February 05, 2017. FINDINGS: CT HEAD FINDINGS Brain: No evidence of acute infarction, hemorrhage, hydrocephalus, extra-axial collection or mass effect. Approximately 1.2 cm calcified extra-axial mass along the posterior right falx, likely meningioma that is slightly larger relative to 2018. No mass effect. Vascular: No hyperdense vessel. Skull: No acute fracture. Sinuses/Orbits: Clear sinuses.  No acute orbital findings. Other: No mastoid effusions. CT CERVICAL, THORACIC AND LUMBAR SPINE FINDINGS Alignment: Mild anterolisthesis of C3 on C4, likely degenerative given facet arthropathy at this level. No substantial sagittal subluxation. Skull base and vertebrae: Age indeterminate T3 superior endplate fracture with slight height loss. Chronic T12 burst fracture with similar severe central height loss and bony retropulsion. No evidence of acute fracture in the cervical or lumbar spine. Soft tissues and spinal canal: No prevertebral fluid or swelling. No visible canal hematoma. Disc levels: Mild-to-moderate multilevel degenerative change. Paraspinal: Visualized lungs are clear. IMPRESSION: CT  head: 1. No evidence of acute intracranial abnormality. 2. Approximate 1.2 cm probable meningioma along the posterior right falx without mass effect. CT cervical, thoracic and lumbar spine: 1. Age indeterminate T3 superior endplate fracture with slight height loss. Correlate with the presence or absence of point tenderness. 2. Chronic T12 burst fracture with similar severe central height loss and bony retropulsion. Electronically Signed   By: Gilmore GORMAN Molt M.D.   On: 10/10/2023 01:41   DG Elbow 2 Views Right Result Date: 10/10/2023 CLINICAL DATA:  Recent fall with right elbow pain, initial  encounter EXAM: RIGHT ELBOW - 2 VIEW COMPARISON:  None Available. FINDINGS: There is no evidence of fracture, dislocation, or joint effusion. There is no evidence of arthropathy or other focal bone abnormality. Soft tissues are unremarkable. IMPRESSION: No acute abnormality noted. Electronically Signed   By: Oneil Devonshire M.D.   On: 10/10/2023 01:00   DG Shoulder Right Result Date: 10/10/2023 CLINICAL DATA:  Recent fall with right shoulder pain, initial encounter EXAM: RIGHT SHOULDER - 2+ VIEW COMPARISON:  08/13/2022 FINDINGS: Degenerative changes of the acromioclavicular joint are seen. No acute fracture or dislocation is noted. Underlying bony thorax appears within normal limits. IMPRESSION: Mild degenerative change without acute abnormality. Electronically Signed   By: Oneil Devonshire M.D.   On: 10/10/2023 00:59     Procedures   Medications Ordered in the ED  Tdap (BOOSTRIX ) injection 0.5 mL (0.5 mLs Intramuscular Given 10/10/23 0118)                                    Medical Decision Making Amount and/or Complexity of Data Reviewed Labs: ordered. Radiology: ordered.  Risk Prescription drug management.   Fall/syncopal episode with complaint of neck and back pain as well as pain in the right shoulder and elbow.  I have ordered CT scans of head and cervical spine as well as lumbar and thoracic spine.  I ordered x-rays of the right shoulder and elbow.  I have ordered electrocardiogram and screening labs including CBC and basic metabolic panel, troponin, CK.  I have ordered a urinalysis to rule out UTI.  I have reviewed her past records, and note neurology office visit on 07/29/2023 and note diagnosis of chronic vertigo/dizziness.  X-rays and CT scans showed no acute injury.  Old burst fracture of T12 is identified and unchanged.  I have independently viewed all of the images, and agree with the radiologist's interpretation.  I have reviewed her laboratory tests, and my interpretation is normal  troponin, normal CK, stable elevation of BUN, elevated random glucose level which will need to be followed as an outpatient, stable macrocytosis, leukocytosis which is felt to be stress related and not indicative of infection.  Urinalysis showing mild pyuria without bacteria and negative nitrite-no evidence of UTI.  No evidence of an acute cardiac event, no evidence of rhabdomyolysis.  Family has arrived.  I have discussed with him the need to have some sort of an alert device where patient can call for help if she falls without her phone nearby.   CRITICAL CARE Performed by: Alm Lias Total critical care time: 40 minutes Critical care time was exclusive of separately billable procedures and treating other patients. Critical care was necessary to treat or prevent imminent or life-threatening deterioration. Critical care was time spent personally by me on the following activities: development of treatment plan with patient and/or surrogate as well as nursing,  discussions with consultants, evaluation of patient's response to treatment, examination of patient, obtaining history from patient or surrogate, ordering and performing treatments and interventions, ordering and review of laboratory studies, ordering and review of radiographic studies, pulse oximetry and re-evaluation of patient's condition.  Final diagnoses:  Fall at home, initial encounter  Skin tear of right elbow without complication, initial encounter  Elevated random blood glucose level  Macrocytosis without anemia  Elevated BUN    ED Discharge Orders     None          Raford Lenis, MD 10/10/23 9651    Raford Lenis, MD 10/10/23 [phone]

## 2023-10-10 NOTE — ED Triage Notes (Addendum)
 Pt BIB [facility] from home. Pt reports she was feeling dizzy and then had a fall. +LOC She was been down since around 1400 today. She endorses pain to her right side, neck and back pain. EMS reports an abrasion to right elbow. A&Ox4.  She is on blood thinners, plavix . During triage process a pressure injury is noted on the right side of her buttocks. Pt arrives in a c-collar.   EMS 148/70BP 82P 100% RA 160cbg 22 R Hand 20 RFA

## 2023-10-10 NOTE — ED Notes (Signed)
 Upon discharge the pt had difficulty standing and getting into the chair. This nurse educated to pt and family about exploring other options than going home. The pt and pt's daughter declined and the pt stated I will have to be okay.

## 2023-10-10 NOTE — ED Notes (Signed)
 Trauma Response Nurse Documentation   Deanna Hill is a 83 y.Deanna. female arriving to [facility] ED via EMS  On clopidogrel  75 mg daily. Trauma was activated as a Level 2 by ED charge RN based on the following trauma criteria Elderly patients > 65 with head trauma on anti-coagulation (excluding ASA).  Patient cleared for CT by Dr. Raford EDP. Pt transported to CT with trauma response nurse present to monitor. RN remained with the patient throughout their absence from the department for clinical observation.   GCS 15.   History   Past Medical History:  Diagnosis Date   Anxiety    Coronary artery disease, non-occlusive 05/2013   Cath for Abn Myoview => mild CAD, 30% pCx. Otw normal Coronaries.  Normlal EF.   Essential tremor 06/19/2015   GERD (gastroesophageal reflux disease)    Hyperlipidemia    Hypertension    Hypothyroidism    IBS (irritable bowel syndrome)    Orthostatic hypotension 05/22/2016   Parkinson's disease (HCC)    Senile calcific aortic valve sclerosis 04/2016   2D echo showed aortic sclerosis but no stenosis.  Normal wall motion.  GR 2 DD   Thrombocytosis 03/11/2011     Past Surgical History:  Procedure Laterality Date   ABDOMINAL HYSTERECTOMY  1979   CAROTID DOPPLERS  01/2017   normal Carotid, Sub-Clavian & Vertebral Arteries.   CATARACT EXTRACTION Bilateral 2015   Dr. Cleatus   CHOLECYSTECTOMY, LAPAROSCOPIC  2001   ESOPHAGOGASTRODUODENOSCOPY (EGD) WITH PROPOFOL  N/A 08/15/2022   Procedure: ESOPHAGOGASTRODUODENOSCOPY (EGD) WITH PROPOFOL ;  Surgeon: Burnette Fallow, MD;  Location[facility] ENDOSCOPY;  Service: Gastroenterology;  Laterality: N/A;   LEFT HEART CATHETERIZATION WITH CORONARY ANGIOGRAM N/A 06/08/2013   Procedure: LEFT HEART CATHETERIZATION WITH CORONARY ANGIOGRAM;  Surgeon: Fallow GORMAN Somerset, MD;  Location: [facility] CATH LAB;  Service: Cardiovascular: Dr. Somerset: Mild CAD, 30%pCx.  Otherwise nonobstructive disease.  Normal LV function.   TOE SURGERY Left 1994    TONSILLECTOMY  1952   TRANSTHORACIC ECHOCARDIOGRAM  02/'18, 11/'18   a) Normal EF of 60-65%.  Normal wall motion.  GR 2 DD.  Aortic sclerosis without stenosis;; b) Mod LVH. EF 60-65%. Ao Sclerosis w/Deanna AS. Pseudonormal - Gr 2 DD.       Initial Focused Assessment (If applicable, or please see trauma documentation): Alert/oriented female presents via EMS from home after a fall in her kitchen approx 1400 today. States she laid on the floor since. Right sided pain, right hip pressure inj noted. Arrives in EMS c-collar.  Airway patent, BS clear No obvious uncontrolled hemorrahge GCS 15 PERRLA 2  CT's Completed:   CT Head and CT C-Spine T/L spine  Interventions:  Portable chest and pelvis XRAY IV start and trauma lab draw EKG XRAY right elbow, right shoulder CT Head and CT C-Spine  Plan for disposition:  Discharge home   Consults completed:  None  Event Summary: Presents via EMS after a fall earlier in the day approx 1400. States she laid on the floor since. C.Deanna right sided body pain. Trauma scans unremarkable, D/C home.    Bedside handoff with ED RN Dedra.    Deanna Hill Deanna Hill  Trauma Response RN  Please call TRN at [phone] for further assistance.

## 2023-10-12 LAB — URINE CULTURE: Culture: >=100000 — AB

## 2023-10-13 ENCOUNTER — Telehealth (HOSPITAL_BASED_OUTPATIENT_CLINIC_OR_DEPARTMENT_OTHER): Payer: Self-pay | Admitting: *Deleted

## 2023-10-13 DIAGNOSIS — G20A1 Parkinson's disease without dyskinesia, without mention of fluctuations: Secondary | ICD-10-CM | POA: Diagnosis not present

## 2023-10-13 DIAGNOSIS — R634 Abnormal weight loss: Secondary | ICD-10-CM | POA: Diagnosis not present

## 2023-10-13 DIAGNOSIS — G903 Multi-system degeneration of the autonomic nervous system: Secondary | ICD-10-CM | POA: Diagnosis not present

## 2023-10-13 NOTE — Telephone Encounter (Signed)
 Post ED Visit - Positive Culture Follow-up: Successful Patient Follow-Up  Culture assessed and recommendations reviewed by:  [x]  Leonor Bash, Pharm.D. []  Venetia Gully, Pharm.D., BCPS AQ-ID []  Garrel Crews, Pharm.D., BCPS []  Almarie Lunger, Pharm.D., BCPS []  [place], [address].D., BCPS, AAHIVP []  Rosaline Bihari, Pharm.D., BCPS, AAHIVP []  Vernell Meier, PharmD, BCPS []  Latanya Hint, PharmD, BCPS []  Donald Medley, PharmD, BCPS []  Rocky Bold, PharmD  Positive urine culture  [x]  Patient discharged without antimicrobial prescription and treatment is now indicated []  Organism is resistant to prescribed ED discharge antimicrobial []  Patient with positive blood cultures  Changes discussed with ED provider: Warren Shad. Patient with worsening confusion , weakness, and falls x 3 in ED d/c. Pt has appointment with provider today for safety evaluation  New antibiotic prescription Keflex 500 mg BID x 5 days Called to [facility] in to [facility] [phone]   Contacted patient, date 10/13/23, time 1113   Jama Wyman Kipper 10/13/2023, 11:12 AM

## 2023-10-21 DIAGNOSIS — G903 Multi-system degeneration of the autonomic nervous system: Secondary | ICD-10-CM | POA: Diagnosis not present

## 2023-10-21 DIAGNOSIS — J45909 Unspecified asthma, uncomplicated: Secondary | ICD-10-CM | POA: Diagnosis not present

## 2023-10-21 DIAGNOSIS — I129 Hypertensive chronic kidney disease with stage 1 through stage 4 chronic kidney disease, or unspecified chronic kidney disease: Secondary | ICD-10-CM | POA: Diagnosis not present

## 2023-10-21 DIAGNOSIS — N183 Chronic kidney disease, stage 3 unspecified: Secondary | ICD-10-CM | POA: Diagnosis not present

## 2023-10-21 DIAGNOSIS — G20A1 Parkinson's disease without dyskinesia, without mention of fluctuations: Secondary | ICD-10-CM | POA: Diagnosis not present

## 2023-11-15 DEATH — deceased

## 2023-12-07 ENCOUNTER — Other Ambulatory Visit

## 2023-12-07 ENCOUNTER — Ambulatory Visit: Admitting: Hematology and Oncology

## 2024-02-17 ENCOUNTER — Ambulatory Visit: Admitting: Neurology
